# Patient Record
Sex: Female | Born: 1950 | Race: White | Hispanic: No | Marital: Married | Smoking: Former smoker
Health system: Southern US, Community
[De-identification: ages and names within clinical notes are randomized; demographics above are authoritative.]

## PROBLEM LIST (undated history)

## (undated) ENCOUNTER — Emergency Department (HOSPITAL_COMMUNITY): Payer: Medicare HMO | Source: Home / Self Care

## (undated) DIAGNOSIS — C3490 Malignant neoplasm of unspecified part of unspecified bronchus or lung: Secondary | ICD-10-CM

## (undated) DIAGNOSIS — G629 Polyneuropathy, unspecified: Secondary | ICD-10-CM

## (undated) DIAGNOSIS — T8859XA Other complications of anesthesia, initial encounter: Secondary | ICD-10-CM

## (undated) DIAGNOSIS — I8290 Acute embolism and thrombosis of unspecified vein: Secondary | ICD-10-CM

## (undated) DIAGNOSIS — E785 Hyperlipidemia, unspecified: Secondary | ICD-10-CM

## (undated) DIAGNOSIS — Z923 Personal history of irradiation: Secondary | ICD-10-CM

## (undated) DIAGNOSIS — I871 Compression of vein: Secondary | ICD-10-CM

## (undated) DIAGNOSIS — T4145XA Adverse effect of unspecified anesthetic, initial encounter: Secondary | ICD-10-CM

## (undated) HISTORY — DX: Acute embolism and thrombosis of unspecified vein: I82.90

## (undated) HISTORY — PX: TONSILLECTOMY: SUR1361

## (undated) HISTORY — PX: TUBAL LIGATION: SHX77

## (undated) HISTORY — DX: Compression of vein: I87.1

## (undated) HISTORY — DX: Malignant neoplasm of unspecified part of unspecified bronchus or lung: C34.90

## (undated) HISTORY — DX: Polyneuropathy, unspecified: G62.9

## (undated) HISTORY — DX: Hyperlipidemia, unspecified: E78.5

## (undated) HISTORY — PX: CERVICAL BIOPSY: SHX590

---

## 2000-05-14 ENCOUNTER — Encounter: Payer: Self-pay | Admitting: Emergency Medicine

## 2000-05-14 ENCOUNTER — Emergency Department (HOSPITAL_COMMUNITY): Admission: EM | Admit: 2000-05-14 | Discharge: 2000-05-14 | Payer: Self-pay | Admitting: Emergency Medicine

## 2004-11-04 ENCOUNTER — Encounter: Admission: RE | Admit: 2004-11-04 | Discharge: 2004-11-04 | Payer: Self-pay | Admitting: Internal Medicine

## 2006-01-17 ENCOUNTER — Encounter: Admission: RE | Admit: 2006-01-17 | Discharge: 2006-01-17 | Payer: Self-pay | Admitting: Internal Medicine

## 2007-02-01 ENCOUNTER — Encounter: Admission: RE | Admit: 2007-02-01 | Discharge: 2007-02-01 | Payer: Self-pay | Admitting: Internal Medicine

## 2010-07-10 ENCOUNTER — Inpatient Hospital Stay (HOSPITAL_COMMUNITY): Admission: EM | Admit: 2010-07-10 | Discharge: 2010-07-15 | Payer: Self-pay | Admitting: Emergency Medicine

## 2010-07-11 ENCOUNTER — Ambulatory Visit: Payer: Self-pay | Admitting: Thoracic Surgery (Cardiothoracic Vascular Surgery)

## 2010-07-12 ENCOUNTER — Encounter (INDEPENDENT_AMBULATORY_CARE_PROVIDER_SITE_OTHER): Payer: Self-pay | Admitting: Internal Medicine

## 2010-07-12 ENCOUNTER — Ambulatory Visit: Payer: Self-pay | Admitting: Oncology

## 2010-07-13 ENCOUNTER — Encounter (INDEPENDENT_AMBULATORY_CARE_PROVIDER_SITE_OTHER): Payer: Self-pay | Admitting: Internal Medicine

## 2010-07-13 ENCOUNTER — Encounter (HOSPITAL_COMMUNITY): Payer: Self-pay | Admitting: Oncology

## 2010-07-14 ENCOUNTER — Ambulatory Visit: Payer: Self-pay | Admitting: Oncology

## 2010-07-28 LAB — CBC WITH DIFFERENTIAL/PLATELET
Basophils Absolute: 0 10*3/uL (ref 0.0–0.1)
EOS%: 0.2 % (ref 0.0–7.0)
Eosinophils Absolute: 0 10*3/uL (ref 0.0–0.5)
HCT: 34.2 % — ABNORMAL LOW (ref 34.8–46.6)
HGB: 11.8 g/dL (ref 11.6–15.9)
MCH: 30.7 pg (ref 25.1–34.0)
MCV: 88.8 fL (ref 79.5–101.0)
MONO%: 4.4 % (ref 0.0–14.0)
NEUT%: 87.9 % — ABNORMAL HIGH (ref 38.4–76.8)
Platelets: 140 10*3/uL — ABNORMAL LOW (ref 145–400)

## 2010-08-03 ENCOUNTER — Ambulatory Visit (HOSPITAL_COMMUNITY): Admission: RE | Admit: 2010-08-03 | Discharge: 2010-08-03 | Payer: Self-pay | Admitting: Oncology

## 2010-08-03 LAB — COMPREHENSIVE METABOLIC PANEL
AST: 21 U/L (ref 0–37)
Alkaline Phosphatase: 92 U/L (ref 39–117)
BUN: 13 mg/dL (ref 6–23)
Calcium: 9.2 mg/dL (ref 8.4–10.5)
Creatinine, Ser: 0.64 mg/dL (ref 0.40–1.20)
Glucose, Bld: 96 mg/dL (ref 70–99)

## 2010-08-03 LAB — CBC WITH DIFFERENTIAL/PLATELET
BASO%: 1.1 % (ref 0.0–2.0)
Eosinophils Absolute: 0 10*3/uL (ref 0.0–0.5)
LYMPH%: 20.5 % (ref 14.0–49.7)
MCHC: 34.2 g/dL (ref 31.5–36.0)
MONO#: 0.7 10*3/uL (ref 0.1–0.9)
NEUT#: 8 10*3/uL — ABNORMAL HIGH (ref 1.5–6.5)
Platelets: 367 10*3/uL (ref 145–400)
RBC: 3.74 10*6/uL (ref 3.70–5.45)
WBC: 11.2 10*3/uL — ABNORMAL HIGH (ref 3.9–10.3)
lymph#: 2.3 10*3/uL (ref 0.9–3.3)
nRBC: 0 % (ref 0–0)

## 2010-08-03 LAB — URIC ACID: Uric Acid, Serum: 3.2 mg/dL (ref 2.4–7.0)

## 2010-08-11 LAB — CBC WITH DIFFERENTIAL/PLATELET
Basophils Absolute: 0.1 10*3/uL (ref 0.0–0.1)
Eosinophils Absolute: 0.2 10*3/uL (ref 0.0–0.5)
HCT: 31.9 % — ABNORMAL LOW (ref 34.8–46.6)
HGB: 10.9 g/dL — ABNORMAL LOW (ref 11.6–15.9)
MCV: 89.9 fL (ref 79.5–101.0)
MONO%: 4 % (ref 0.0–14.0)
NEUT#: 16.9 10*3/uL — ABNORMAL HIGH (ref 1.5–6.5)
NEUT%: 85.6 % — ABNORMAL HIGH (ref 38.4–76.8)
RDW: 15.5 % — ABNORMAL HIGH (ref 11.2–14.5)

## 2010-08-13 ENCOUNTER — Ambulatory Visit: Payer: Self-pay | Admitting: Oncology

## 2010-08-17 LAB — CBC WITH DIFFERENTIAL/PLATELET
Eosinophils Absolute: 0.2 10*3/uL (ref 0.0–0.5)
HCT: 31.7 % — ABNORMAL LOW (ref 34.8–46.6)
LYMPH%: 17.1 % (ref 14.0–49.7)
MCHC: 33.8 g/dL (ref 31.5–36.0)
MONO#: 0.8 10*3/uL (ref 0.1–0.9)
NEUT#: 8.5 10*3/uL — ABNORMAL HIGH (ref 1.5–6.5)
NEUT%: 72.9 % (ref 38.4–76.8)
Platelets: 161 10*3/uL (ref 145–400)
WBC: 11.6 10*3/uL — ABNORMAL HIGH (ref 3.9–10.3)

## 2010-08-23 ENCOUNTER — Ambulatory Visit (HOSPITAL_COMMUNITY): Admission: RE | Admit: 2010-08-23 | Discharge: 2010-08-23 | Payer: Self-pay | Admitting: Oncology

## 2010-08-23 LAB — CBC WITH DIFFERENTIAL/PLATELET
Basophils Absolute: 0 10*3/uL (ref 0.0–0.1)
EOS%: 2.2 % (ref 0.0–7.0)
HCT: 32.3 % — ABNORMAL LOW (ref 34.8–46.6)
HGB: 11.3 g/dL — ABNORMAL LOW (ref 11.6–15.9)
MCH: 32.9 pg (ref 25.1–34.0)
MCV: 93.7 fL (ref 79.5–101.0)
MONO%: 6 % (ref 0.0–14.0)
NEUT%: 76.2 % (ref 38.4–76.8)

## 2010-08-23 LAB — COMPREHENSIVE METABOLIC PANEL
Albumin: 4.3 g/dL (ref 3.5–5.2)
Alkaline Phosphatase: 110 U/L (ref 39–117)
BUN: 13 mg/dL (ref 6–23)
Glucose, Bld: 95 mg/dL (ref 70–99)
Total Bilirubin: 0.2 mg/dL — ABNORMAL LOW (ref 0.3–1.2)

## 2010-09-03 LAB — CBC WITH DIFFERENTIAL/PLATELET
Eosinophils Absolute: 0.1 10*3/uL (ref 0.0–0.5)
LYMPH%: 24.1 % (ref 14.0–49.7)
MONO#: 0.5 10*3/uL (ref 0.1–0.9)
NEUT#: 4 10*3/uL (ref 1.5–6.5)
Platelets: 167 10*3/uL (ref 145–400)
RBC: 3.23 10*6/uL — ABNORMAL LOW (ref 3.70–5.45)
WBC: 6.1 10*3/uL (ref 3.9–10.3)
lymph#: 1.5 10*3/uL (ref 0.9–3.3)
nRBC: 0 % (ref 0–0)

## 2010-09-10 LAB — CBC WITH DIFFERENTIAL/PLATELET
Eosinophils Absolute: 0.2 10*3/uL (ref 0.0–0.5)
MONO#: 0.9 10*3/uL (ref 0.1–0.9)
NEUT#: 11.8 10*3/uL — ABNORMAL HIGH (ref 1.5–6.5)
Platelets: 228 10*3/uL (ref 145–400)
RBC: 3.25 10*6/uL — ABNORMAL LOW (ref 3.70–5.45)
RDW: 20.8 % — ABNORMAL HIGH (ref 11.2–14.5)
WBC: 14.9 10*3/uL — ABNORMAL HIGH (ref 3.9–10.3)

## 2010-09-10 LAB — COMPREHENSIVE METABOLIC PANEL
Albumin: 4.5 g/dL (ref 3.5–5.2)
CO2: 24 mEq/L (ref 19–32)
Chloride: 102 mEq/L (ref 96–112)
Glucose, Bld: 90 mg/dL (ref 70–99)
Potassium: 4.7 mEq/L (ref 3.5–5.3)
Sodium: 140 mEq/L (ref 135–145)
Total Protein: 6.3 g/dL (ref 6.0–8.3)

## 2010-09-10 LAB — CEA: CEA: <0.5 ng/mL (ref 0.0–5.0)

## 2010-09-10 LAB — LACTATE DEHYDROGENASE: LDH: 238 U/L (ref 94–250)

## 2010-09-13 ENCOUNTER — Ambulatory Visit: Payer: Self-pay | Admitting: Oncology

## 2010-09-14 ENCOUNTER — Ambulatory Visit
Admission: RE | Admit: 2010-09-14 | Discharge: 2010-11-19 | Payer: Self-pay | Source: Home / Self Care | Attending: Radiation Oncology | Admitting: Radiation Oncology

## 2010-09-21 ENCOUNTER — Ambulatory Visit
Admission: RE | Admit: 2010-09-21 | Discharge: 2010-09-21 | Payer: Self-pay | Source: Home / Self Care | Admitting: Radiation Oncology

## 2010-09-21 LAB — CBC WITH DIFFERENTIAL/PLATELET
Basophils Absolute: 0.1 10*3/uL (ref 0.0–0.1)
EOS%: 0.7 % (ref 0.0–7.0)
HGB: 10.1 g/dL — ABNORMAL LOW (ref 11.6–15.9)
LYMPH%: 21.2 % (ref 14.0–49.7)
MCH: 32.6 pg (ref 25.1–34.0)
MCV: 94.8 fL (ref 79.5–101.0)
MONO%: 6.1 % (ref 0.0–14.0)
RBC: 3.1 10*6/uL — ABNORMAL LOW (ref 3.70–5.45)
RDW: 17.7 % — ABNORMAL HIGH (ref 11.2–14.5)

## 2010-09-28 LAB — CBC WITH DIFFERENTIAL/PLATELET
BASO%: 0.8 % (ref 0.0–2.0)
EOS%: 0.9 % (ref 0.0–7.0)
Eosinophils Absolute: 0.3 10*3/uL (ref 0.0–0.5)
MCHC: 34.7 g/dL (ref 31.5–36.0)
MCV: 95.7 fL (ref 79.5–101.0)
MONO%: 5.4 % (ref 0.0–14.0)
NEUT#: 23.4 10*3/uL — ABNORMAL HIGH (ref 1.5–6.5)
RBC: 3.22 10*6/uL — ABNORMAL LOW (ref 3.70–5.45)
RDW: 18.2 % — ABNORMAL HIGH (ref 11.2–14.5)
nRBC: 0 % (ref 0–0)

## 2010-10-05 LAB — CBC WITH DIFFERENTIAL/PLATELET
EOS%: 2.1 % (ref 0.0–7.0)
Eosinophils Absolute: 0.1 10*3/uL (ref 0.0–0.5)
HCT: 29.6 % — ABNORMAL LOW (ref 34.8–46.6)
HGB: 10.4 g/dL — ABNORMAL LOW (ref 11.6–15.9)
MCH: 34.7 pg — ABNORMAL HIGH (ref 25.1–34.0)
MONO%: 12 % (ref 0.0–14.0)
Platelets: 419 10*3/uL — ABNORMAL HIGH (ref 145–400)
RBC: 3 10*6/uL — ABNORMAL LOW (ref 3.70–5.45)
RDW: 18.9 % — ABNORMAL HIGH (ref 11.2–14.5)
WBC: 5.1 10*3/uL (ref 3.9–10.3)
lymph#: 1.2 10*3/uL (ref 0.9–3.3)

## 2010-10-05 LAB — COMPREHENSIVE METABOLIC PANEL
ALT: 14 U/L (ref 0–35)
Alkaline Phosphatase: 96 U/L (ref 39–117)
Sodium: 140 mEq/L (ref 135–145)
Total Bilirubin: 0.3 mg/dL (ref 0.3–1.2)
Total Protein: 5.9 g/dL — ABNORMAL LOW (ref 6.0–8.3)

## 2010-10-05 LAB — LACTATE DEHYDROGENASE: LDH: 165 U/L (ref 94–250)

## 2010-10-13 LAB — CBC WITH DIFFERENTIAL/PLATELET
Basophils Absolute: 0 10*3/uL (ref 0.0–0.1)
EOS%: 0.6 % (ref 0.0–7.0)
Eosinophils Absolute: 0.1 10*3/uL (ref 0.0–0.5)
HCT: 28.5 % — ABNORMAL LOW (ref 34.8–46.6)
HGB: 10.2 g/dL — ABNORMAL LOW (ref 11.6–15.9)
MCH: 36 pg — ABNORMAL HIGH (ref 25.1–34.0)
MONO#: 0 10*3/uL — ABNORMAL LOW (ref 0.1–0.9)
NEUT#: 7.9 10*3/uL — ABNORMAL HIGH (ref 1.5–6.5)
NEUT%: 90.1 % — ABNORMAL HIGH (ref 38.4–76.8)
RDW: 16.4 % — ABNORMAL HIGH (ref 11.2–14.5)
WBC: 8.8 10*3/uL (ref 3.9–10.3)
lymph#: 0.8 10*3/uL — ABNORMAL LOW (ref 0.9–3.3)

## 2010-10-18 ENCOUNTER — Ambulatory Visit: Payer: Self-pay | Admitting: Oncology

## 2010-10-20 LAB — CBC WITH DIFFERENTIAL/PLATELET
BASO%: 0.5 % (ref 0.0–2.0)
Basophils Absolute: 0.1 10*3/uL (ref 0.0–0.1)
Eosinophils Absolute: 0.2 10*3/uL (ref 0.0–0.5)
HCT: 28.3 % — ABNORMAL LOW (ref 34.8–46.6)
HGB: 9.7 g/dL — ABNORMAL LOW (ref 11.6–15.9)
MONO#: 0.9 10*3/uL (ref 0.1–0.9)
NEUT#: 9.2 10*3/uL — ABNORMAL HIGH (ref 1.5–6.5)
NEUT%: 82.5 % — ABNORMAL HIGH (ref 38.4–76.8)
WBC: 11.1 10*3/uL — ABNORMAL HIGH (ref 3.9–10.3)
lymph#: 0.9 10*3/uL (ref 0.9–3.3)

## 2010-10-21 ENCOUNTER — Ambulatory Visit
Admission: RE | Admit: 2010-10-21 | Discharge: 2010-10-21 | Payer: Self-pay | Source: Home / Self Care | Attending: Radiation Oncology | Admitting: Radiation Oncology

## 2010-10-21 ENCOUNTER — Encounter: Payer: Self-pay | Admitting: Radiation Oncology

## 2010-10-21 DIAGNOSIS — I8290 Acute embolism and thrombosis of unspecified vein: Secondary | ICD-10-CM

## 2010-10-21 HISTORY — DX: Acute embolism and thrombosis of unspecified vein: I82.90

## 2010-10-22 LAB — COMPREHENSIVE METABOLIC PANEL
ALT: 13 U/L (ref 0–35)
AST: 12 U/L (ref 0–37)
Albumin: 4 g/dL (ref 3.5–5.2)
Alkaline Phosphatase: 103 U/L (ref 39–117)
BUN: 15 mg/dL (ref 6–23)
CO2: 25 mEq/L (ref 19–32)
Calcium: 9 mg/dL (ref 8.4–10.5)
Chloride: 105 mEq/L (ref 96–112)
Creatinine, Ser: 0.84 mg/dL (ref 0.40–1.20)
Glucose, Bld: 106 mg/dL — ABNORMAL HIGH (ref 70–99)
Potassium: 4.3 mEq/L (ref 3.5–5.3)
Sodium: 141 mEq/L (ref 135–145)
Total Bilirubin: 0.3 mg/dL (ref 0.3–1.2)
Total Protein: 5.8 g/dL — ABNORMAL LOW (ref 6.0–8.3)

## 2010-10-22 LAB — CBC WITH DIFFERENTIAL/PLATELET
BASO%: 0 % (ref 0.0–2.0)
Basophils Absolute: 0 10*3/uL (ref 0.0–0.1)
EOS%: 1.4 % (ref 0.0–7.0)
Eosinophils Absolute: 0.2 10*3/uL (ref 0.0–0.5)
HCT: 26.6 % — ABNORMAL LOW (ref 34.8–46.6)
HGB: 9.5 g/dL — ABNORMAL LOW (ref 11.6–15.9)
LYMPH%: 5.4 % — ABNORMAL LOW (ref 14.0–49.7)
MCH: 36 pg — ABNORMAL HIGH (ref 25.1–34.0)
MCHC: 35.6 g/dL (ref 31.5–36.0)
MCV: 101.1 fL — ABNORMAL HIGH (ref 79.5–101.0)
MONO#: 0.6 10*3/uL (ref 0.1–0.9)
MONO%: 4.8 % (ref 0.0–14.0)
NEUT#: 11.2 10*3/uL — ABNORMAL HIGH (ref 1.5–6.5)
NEUT%: 88.4 % — ABNORMAL HIGH (ref 38.4–76.8)
Platelets: 124 10*3/uL — ABNORMAL LOW (ref 145–400)
RBC: 2.63 10*6/uL — ABNORMAL LOW (ref 3.70–5.45)
RDW: 15.1 % — ABNORMAL HIGH (ref 11.2–14.5)
WBC: 12.6 10*3/uL — ABNORMAL HIGH (ref 3.9–10.3)
lymph#: 0.7 10*3/uL — ABNORMAL LOW (ref 0.9–3.3)

## 2010-10-22 LAB — LACTATE DEHYDROGENASE: LDH: 171 U/L (ref 94–250)

## 2010-10-29 ENCOUNTER — Ambulatory Visit: Admit: 2010-10-29 | Payer: Self-pay | Admitting: Radiation Oncology

## 2010-10-29 LAB — CBC WITH DIFFERENTIAL/PLATELET
Basophils Absolute: 0 10*3/uL (ref 0.0–0.1)
EOS%: 3.1 % (ref 0.0–7.0)
Eosinophils Absolute: 0.1 10*3/uL (ref 0.0–0.5)
HCT: 29.3 % — ABNORMAL LOW (ref 34.8–46.6)
HGB: 10.4 g/dL — ABNORMAL LOW (ref 11.6–15.9)
MCH: 35.7 pg — ABNORMAL HIGH (ref 25.1–34.0)
MCV: 100.2 fL (ref 79.5–101.0)
MONO%: 13.7 % (ref 0.0–14.0)
NEUT#: 3.5 10*3/uL (ref 1.5–6.5)
NEUT%: 74.4 % (ref 38.4–76.8)
Platelets: 346 10*3/uL (ref 145–400)
RDW: 15.6 % — ABNORMAL HIGH (ref 11.2–14.5)

## 2010-10-29 LAB — COMPREHENSIVE METABOLIC PANEL
Albumin: 4.3 g/dL (ref 3.5–5.2)
BUN: 20 mg/dL (ref 6–23)
CO2: 24 mEq/L (ref 19–32)
Glucose, Bld: 97 mg/dL (ref 70–99)
Sodium: 136 mEq/L (ref 135–145)
Total Bilirubin: 0.3 mg/dL (ref 0.3–1.2)
Total Protein: 6 g/dL (ref 6.0–8.3)

## 2010-11-03 ENCOUNTER — Encounter: Admission: RE | Admit: 2010-11-03 | Payer: Self-pay | Source: Home / Self Care | Admitting: Radiation Oncology

## 2010-11-04 LAB — CBC WITH DIFFERENTIAL/PLATELET
Basophils Absolute: 0.1 10*3/uL (ref 0.0–0.1)
Eosinophils Absolute: 0.2 10*3/uL (ref 0.0–0.5)
HGB: 10.7 g/dL — ABNORMAL LOW (ref 11.6–15.9)
MONO#: 0.8 10*3/uL (ref 0.1–0.9)
NEUT#: 4.3 10*3/uL (ref 1.5–6.5)
Platelets: 185 10*3/uL (ref 145–400)
RBC: 3.15 10*6/uL — ABNORMAL LOW (ref 3.70–5.45)
RDW: 14.5 % (ref 11.2–14.5)
WBC: 5.7 10*3/uL (ref 3.9–10.3)
nRBC: 0 % (ref 0–0)

## 2010-11-10 LAB — CBC WITH DIFFERENTIAL/PLATELET
BASO%: 0.5 % (ref 0.0–2.0)
Basophils Absolute: 0.1 10*3/uL (ref 0.0–0.1)
EOS%: 1.1 % (ref 0.0–7.0)
Eosinophils Absolute: 0.2 10*3/uL (ref 0.0–0.5)
HCT: 29.7 % — ABNORMAL LOW (ref 34.8–46.6)
HGB: 10.4 g/dL — ABNORMAL LOW (ref 11.6–15.9)
LYMPH%: 2 % — ABNORMAL LOW (ref 14.0–49.7)
MCH: 34.1 pg — ABNORMAL HIGH (ref 25.1–34.0)
MCHC: 35 g/dL (ref 31.5–36.0)
MCV: 97.4 fL (ref 79.5–101.0)
MONO#: 0.2 10*3/uL (ref 0.1–0.9)
MONO%: 0.8 % (ref 0.0–14.0)
NEUT#: 18.1 10*3/uL — ABNORMAL HIGH (ref 1.5–6.5)
NEUT%: 95.6 % — ABNORMAL HIGH (ref 38.4–76.8)
Platelets: 79 10*3/uL — ABNORMAL LOW (ref 145–400)
RBC: 3.05 10*6/uL — ABNORMAL LOW (ref 3.70–5.45)
RDW: 14.1 % (ref 11.2–14.5)
WBC: 18.9 10*3/uL — ABNORMAL HIGH (ref 3.9–10.3)
lymph#: 0.4 10*3/uL — ABNORMAL LOW (ref 0.9–3.3)
nRBC: 0 % (ref 0–0)

## 2010-11-12 ENCOUNTER — Inpatient Hospital Stay (HOSPITAL_COMMUNITY)
Admission: EM | Admit: 2010-11-12 | Discharge: 2010-11-16 | Payer: Self-pay | Source: Home / Self Care | Attending: Internal Medicine | Admitting: Internal Medicine

## 2010-11-18 LAB — CBC WITH DIFFERENTIAL/PLATELET
BASO%: 0.3 % (ref 0.0–2.0)
Basophils Absolute: 0 10*3/uL (ref 0.0–0.1)
EOS%: 2.6 % (ref 0.0–7.0)
Eosinophils Absolute: 0.1 10*3/uL (ref 0.0–0.5)
HCT: 28.1 % — ABNORMAL LOW (ref 34.8–46.6)
HGB: 10 g/dL — ABNORMAL LOW (ref 11.6–15.9)
LYMPH%: 9.8 % — ABNORMAL LOW (ref 14.0–49.7)
MCH: 33.5 pg (ref 25.1–34.0)
MCHC: 35.7 g/dL (ref 31.5–36.0)
MCV: 93.8 fL (ref 79.5–101.0)
MONO#: 0.4 10*3/uL (ref 0.1–0.9)
MONO%: 6.4 % (ref 0.0–14.0)
NEUT#: 4.6 10*3/uL (ref 1.5–6.5)
NEUT%: 80.9 % — ABNORMAL HIGH (ref 38.4–76.8)
Platelets: 72 10*3/uL — ABNORMAL LOW (ref 145–400)
RBC: 2.99 10*6/uL — ABNORMAL LOW (ref 3.70–5.45)
RDW: 15.6 % — ABNORMAL HIGH (ref 11.2–14.5)
WBC: 5.7 10*3/uL (ref 3.9–10.3)
lymph#: 0.6 10*3/uL — ABNORMAL LOW (ref 0.9–3.3)

## 2010-11-19 ENCOUNTER — Ambulatory Visit: Payer: Self-pay | Admitting: Oncology

## 2010-11-22 LAB — CBC
HCT: 24.9 % — ABNORMAL LOW (ref 36.0–46.0)
HCT: 22.8 % — ABNORMAL LOW (ref 36.0–46.0)
HCT: 23 % — ABNORMAL LOW (ref 36.0–46.0)
HCT: 20.6 % — ABNORMAL LOW (ref 36.0–46.0)
HCT: 27.6 % — ABNORMAL LOW (ref 36.0–46.0)
HCT: 31.8 % — ABNORMAL LOW (ref 36.0–46.0)
Hemoglobin: 8.6 g/dL — ABNORMAL LOW (ref 12.0–15.0)
Hemoglobin: 8 g/dL — ABNORMAL LOW (ref 12.0–15.0)
Hemoglobin: 8 g/dL — ABNORMAL LOW (ref 12.0–15.0)
Hemoglobin: 7.4 g/dL — ABNORMAL LOW (ref 12.0–15.0)
Hemoglobin: 9.8 g/dL — ABNORMAL LOW (ref 12.0–15.0)
Hemoglobin: 11.3 g/dL — ABNORMAL LOW (ref 12.0–15.0)
MCH: 34.3 pg — ABNORMAL HIGH (ref 26.0–34.0)
MCH: 34.2 pg — ABNORMAL HIGH (ref 26.0–34.0)
MCH: 33.5 pg (ref 26.0–34.0)
MCH: 33.8 pg (ref 26.0–34.0)
MCH: 32.3 pg (ref 26.0–34.0)
MCH: 32.1 pg (ref 26.0–34.0)
MCHC: 34.5 g/dL (ref 30.0–36.0)
MCHC: 35.1 g/dL (ref 30.0–36.0)
MCHC: 34.8 g/dL (ref 30.0–36.0)
MCHC: 35.9 g/dL (ref 30.0–36.0)
MCHC: 35.5 g/dL (ref 30.0–36.0)
MCHC: 35.5 g/dL (ref 30.0–36.0)
MCV: 99.2 fL (ref 78.0–100.0)
MCV: 97.4 fL (ref 78.0–100.0)
MCV: 96.2 fL (ref 78.0–100.0)
MCV: 94.1 fL (ref 78.0–100.0)
MCV: 91.1 fL (ref 78.0–100.0)
MCV: 90.3 fL (ref 78.0–100.0)
Platelets: 41 10*3/uL — ABNORMAL LOW (ref 150–400)
Platelets: 34 10*3/uL — ABNORMAL LOW (ref 150–400)
Platelets: 27 10*3/uL — CL (ref 150–400)
Platelets: 20 10*3/uL — CL (ref 150–400)
Platelets: 15 10*3/uL — CL (ref 150–400)
Platelets: 17 10*3/uL — CL (ref 150–400)
RBC: 2.51 MIL/uL — ABNORMAL LOW (ref 3.87–5.11)
RBC: 2.34 MIL/uL — ABNORMAL LOW (ref 3.87–5.11)
RBC: 2.39 MIL/uL — ABNORMAL LOW (ref 3.87–5.11)
RBC: 2.19 MIL/uL — ABNORMAL LOW (ref 3.87–5.11)
RBC: 3.03 MIL/uL — ABNORMAL LOW (ref 3.87–5.11)
RBC: 3.52 MIL/uL — ABNORMAL LOW (ref 3.87–5.11)
RDW: 13.6 % (ref 11.5–15.5)
RDW: 13.3 % (ref 11.5–15.5)
RDW: 13 % (ref 11.5–15.5)
RDW: 12.8 % (ref 11.5–15.5)
RDW: 14.1 % (ref 11.5–15.5)
RDW: 14.3 % (ref 11.5–15.5)
WBC: 1.3 10*3/uL — CL (ref 4.0–10.5)
WBC: 0.8 10*3/uL — CL (ref 4.0–10.5)
WBC: 0.4 10*3/uL — CL (ref 4.0–10.5)
WBC: 0.7 10*3/uL — CL (ref 4.0–10.5)
WBC: 1.8 10*3/uL — ABNORMAL LOW (ref 4.0–10.5)
WBC: 2.1 10*3/uL — ABNORMAL LOW (ref 4.0–10.5)

## 2010-11-22 LAB — CROSSMATCH
ABO/RH(D): B POS
ABO/RH(D): B POS
Antibody Screen: NEGATIVE
Antibody Screen: NEGATIVE
Unit division: 0
Unit division: 0
Unit division: 0
Unit division: 0
Unit division: 0

## 2010-11-22 LAB — BASIC METABOLIC PANEL
BUN: 18 mg/dL (ref 6–23)
BUN: 12 mg/dL (ref 6–23)
BUN: 10 mg/dL (ref 6–23)
BUN: 10 mg/dL (ref 6–23)
CO2: 24 mEq/L (ref 19–32)
CO2: 26 mEq/L (ref 19–32)
CO2: 27 mEq/L (ref 19–32)
CO2: 26 mEq/L (ref 19–32)
Calcium: 8.7 mg/dL (ref 8.4–10.5)
Calcium: 8.7 mg/dL (ref 8.4–10.5)
Calcium: 8.8 mg/dL (ref 8.4–10.5)
Calcium: 9.3 mg/dL (ref 8.4–10.5)
Chloride: 105 mEq/L (ref 96–112)
Chloride: 105 mEq/L (ref 96–112)
Chloride: 105 mEq/L (ref 96–112)
Chloride: 105 mEq/L (ref 96–112)
Creatinine, Ser: 0.84 mg/dL (ref 0.4–1.2)
Creatinine, Ser: 0.8 mg/dL (ref 0.4–1.2)
Creatinine, Ser: 0.75 mg/dL (ref 0.4–1.2)
Creatinine, Ser: 0.83 mg/dL (ref 0.4–1.2)
GFR calc Af Amer: >60 mL/min (ref >60)
GFR calc Af Amer: >60 mL/min (ref >60)
GFR calc Af Amer: >60 mL/min (ref >60)
GFR calc Af Amer: >60 mL/min (ref >60)
GFR calc non Af Amer: >60 mL/min (ref >60)
GFR calc non Af Amer: >60 mL/min (ref >60)
GFR calc non Af Amer: >60 mL/min (ref >60)
GFR calc non Af Amer: >60 mL/min (ref >60)
Glucose, Bld: 105 mg/dL — ABNORMAL HIGH (ref 70–99)
Glucose, Bld: 105 mg/dL — ABNORMAL HIGH (ref 70–99)
Glucose, Bld: 98 mg/dL (ref 70–99)
Glucose, Bld: 134 mg/dL — ABNORMAL HIGH (ref 70–99)
Potassium: 4 mEq/L (ref 3.5–5.1)
Potassium: 3.6 mEq/L (ref 3.5–5.1)
Potassium: 3.6 mEq/L (ref 3.5–5.1)
Potassium: 3.6 mEq/L (ref 3.5–5.1)
Sodium: 136 mEq/L (ref 135–145)
Sodium: 138 mEq/L (ref 135–145)
Sodium: 139 mEq/L (ref 135–145)
Sodium: 139 mEq/L (ref 135–145)

## 2010-11-22 LAB — DIFFERENTIAL
Band Neutrophils: 0 % (ref 0–10)
Band Neutrophils: 0 % (ref 0–10)
Band Neutrophils: 0 % (ref 0–10)
Basophils Absolute: 0 10*3/uL (ref 0.0–0.1)
Basophils Absolute: 0 10*3/uL (ref 0.0–0.1)
Basophils Absolute: 0 10*3/uL (ref 0.0–0.1)
Basophils Absolute: 0 10*3/uL (ref 0.0–0.1)
Basophils Relative: 1 % (ref 0–1)
Basophils Relative: 0 % (ref 0–1)
Basophils Relative: 0 % (ref 0–1)
Basophils Relative: 0 % (ref 0–1)
Eosinophils Absolute: 0.1 10*3/uL (ref 0.0–0.7)
Eosinophils Absolute: 0 10*3/uL (ref 0.0–0.7)
Eosinophils Absolute: 0 10*3/uL (ref 0.0–0.7)
Eosinophils Absolute: 0 10*3/uL (ref 0.0–0.7)
Eosinophils Relative: 5 % (ref 0–5)
Eosinophils Relative: 0 % (ref 0–5)
Eosinophils Relative: 0 % (ref 0–5)
Eosinophils Relative: 0 % (ref 0–5)
Lymphocytes Relative: 16 % (ref 12–46)
Lymphocytes Relative: 0 % — ABNORMAL LOW (ref 12–46)
Lymphocytes Relative: 0 % — ABNORMAL LOW (ref 12–46)
Lymphocytes Relative: 0 % — ABNORMAL LOW (ref 12–46)
Lymphs Abs: 0.2 10*3/uL — ABNORMAL LOW (ref 0.7–4.0)
Lymphs Abs: 0 10*3/uL — ABNORMAL LOW (ref 0.7–4.0)
Lymphs Abs: 0 10*3/uL — ABNORMAL LOW (ref 0.7–4.0)
Lymphs Abs: 0 10*3/uL — ABNORMAL LOW (ref 0.7–4.0)
Monocytes Absolute: 0.1 10*3/uL (ref 0.1–1.0)
Monocytes Absolute: 0 10*3/uL — ABNORMAL LOW (ref 0.1–1.0)
Monocytes Absolute: 0 10*3/uL — ABNORMAL LOW (ref 0.1–1.0)
Monocytes Absolute: 0 10*3/uL — ABNORMAL LOW (ref 0.1–1.0)
Monocytes Relative: 4 % (ref 3–12)
Monocytes Relative: 0 % — ABNORMAL LOW (ref 3–12)
Monocytes Relative: 0 % — ABNORMAL LOW (ref 3–12)
Monocytes Relative: 0 % — ABNORMAL LOW (ref 3–12)
Neutro Abs: 0.9 10*3/uL — ABNORMAL LOW (ref 1.7–7.7)
Neutrophils Relative %: 74 % (ref 43–77)
Neutrophils Relative %: 0 % — ABNORMAL LOW (ref 43–77)
Neutrophils Relative %: 0 % — ABNORMAL LOW (ref 43–77)
Neutrophils Relative %: 0 % — ABNORMAL LOW (ref 43–77)
nRBC: 0 /100 WBC
nRBC: 0 /100 WBC
nRBC: 0 /100 WBC

## 2010-11-22 LAB — PREPARE PLATELETS: Unit division: 0

## 2010-11-22 LAB — NO BLOOD PRODUCTS

## 2010-11-22 LAB — COMPREHENSIVE METABOLIC PANEL
ALT: 13 U/L (ref 0–35)
AST: 11 U/L (ref 0–37)
Albumin: 3.1 g/dL — ABNORMAL LOW (ref 3.5–5.2)
Alkaline Phosphatase: 82 U/L (ref 39–117)
BUN: 9 mg/dL (ref 6–23)
CO2: 25 mEq/L (ref 19–32)
Calcium: 8.9 mg/dL (ref 8.4–10.5)
Chloride: 106 mEq/L (ref 96–112)
Creatinine, Ser: 0.74 mg/dL (ref 0.4–1.2)
GFR calc Af Amer: >60 mL/min (ref >60)
GFR calc non Af Amer: >60 mL/min (ref >60)
Glucose, Bld: 106 mg/dL — ABNORMAL HIGH (ref 70–99)
Potassium: 3.7 mEq/L (ref 3.5–5.1)
Sodium: 138 mEq/L (ref 135–145)
Total Bilirubin: 0.7 mg/dL (ref 0.3–1.2)
Total Protein: 5.2 g/dL — ABNORMAL LOW (ref 6.0–8.3)

## 2010-11-22 LAB — PHOSPHORUS: Phosphorus: 3.1 mg/dL (ref 2.3–4.6)

## 2010-11-22 LAB — ABO/RH: ABO/RH(D): B POS

## 2010-11-22 LAB — PREPARE RBC (CROSSMATCH)

## 2010-11-22 LAB — MAGNESIUM: Magnesium: 1.5 mg/dL (ref 1.5–2.5)

## 2010-11-23 LAB — CBC WITH DIFFERENTIAL/PLATELET
BASO%: 0.4 % (ref 0.0–2.0)
Basophils Absolute: 0 10*3/uL (ref 0.0–0.1)
EOS%: 1 % (ref 0.0–7.0)
Eosinophils Absolute: 0.1 10*3/uL (ref 0.0–0.5)
HCT: 28.9 % — ABNORMAL LOW (ref 34.8–46.6)
HGB: 10.1 g/dL — ABNORMAL LOW (ref 11.6–15.9)
LYMPH%: 12.3 % — ABNORMAL LOW (ref 14.0–49.7)
MCH: 33.4 pg (ref 25.1–34.0)
MCHC: 35.1 g/dL (ref 31.5–36.0)
MCV: 95 fL (ref 79.5–101.0)
MONO#: 0.5 10*3/uL (ref 0.1–0.9)
MONO%: 9 % (ref 0.0–14.0)
NEUT#: 4.6 10*3/uL (ref 1.5–6.5)
NEUT%: 77.3 % — ABNORMAL HIGH (ref 38.4–76.8)
Platelets: 234 10*3/uL (ref 145–400)
RBC: 3.04 10*6/uL — ABNORMAL LOW (ref 3.70–5.45)
RDW: 15.6 % — ABNORMAL HIGH (ref 11.2–14.5)
WBC: 6 10*3/uL (ref 3.9–10.3)
lymph#: 0.7 10*3/uL — ABNORMAL LOW (ref 0.9–3.3)

## 2010-11-23 LAB — COMPREHENSIVE METABOLIC PANEL
ALT: 13 U/L (ref 0–35)
AST: 10 U/L (ref 0–37)
Albumin: 3.9 g/dL (ref 3.5–5.2)
Alkaline Phosphatase: 93 U/L (ref 39–117)
BUN: 15 mg/dL (ref 6–23)
CO2: 27 mEq/L (ref 19–32)
Calcium: 9.2 mg/dL (ref 8.4–10.5)
Chloride: 106 mEq/L (ref 96–112)
Creatinine, Ser: 0.77 mg/dL (ref 0.40–1.20)
Glucose, Bld: 84 mg/dL (ref 70–99)
Potassium: 4.4 mEq/L (ref 3.5–5.3)
Sodium: 141 mEq/L (ref 135–145)
Total Bilirubin: 0.3 mg/dL (ref 0.3–1.2)
Total Protein: 5.6 g/dL — ABNORMAL LOW (ref 6.0–8.3)

## 2010-11-23 LAB — LACTATE DEHYDROGENASE: LDH: 204 U/L (ref 94–250)

## 2010-11-26 ENCOUNTER — Ambulatory Visit (HOSPITAL_COMMUNITY)
Admission: RE | Admit: 2010-11-26 | Discharge: 2010-11-26 | Payer: Self-pay | Source: Home / Self Care | Attending: Radiation Oncology | Admitting: Radiation Oncology

## 2010-11-26 ENCOUNTER — Ambulatory Visit
Admission: RE | Admit: 2010-11-26 | Discharge: 2010-12-07 | Payer: Self-pay | Source: Home / Self Care | Attending: Radiation Oncology | Admitting: Radiation Oncology

## 2010-11-26 ENCOUNTER — Other Ambulatory Visit: Payer: Self-pay | Admitting: Internal Medicine

## 2010-11-26 DIAGNOSIS — C3490 Malignant neoplasm of unspecified part of unspecified bronchus or lung: Secondary | ICD-10-CM

## 2010-12-01 ENCOUNTER — Ambulatory Visit: Admission: RE | Admit: 2010-12-01 | Payer: Self-pay | Source: Home / Self Care | Admitting: Emergency Medicine

## 2010-12-02 LAB — CROSSMATCH
ABO/RH(D): B POS
Antibody Screen: NEGATIVE

## 2010-12-08 ENCOUNTER — Ambulatory Visit: Payer: BC Managed Care – PPO | Attending: Radiation Oncology | Admitting: Radiation Oncology

## 2010-12-08 DIAGNOSIS — Z510 Encounter for antineoplastic radiation therapy: Secondary | ICD-10-CM | POA: Insufficient documentation

## 2010-12-08 DIAGNOSIS — G609 Hereditary and idiopathic neuropathy, unspecified: Secondary | ICD-10-CM | POA: Insufficient documentation

## 2010-12-08 DIAGNOSIS — C3490 Malignant neoplasm of unspecified part of unspecified bronchus or lung: Secondary | ICD-10-CM | POA: Insufficient documentation

## 2010-12-08 DIAGNOSIS — R5381 Other malaise: Secondary | ICD-10-CM | POA: Insufficient documentation

## 2010-12-14 ENCOUNTER — Ambulatory Visit (HOSPITAL_COMMUNITY)
Admission: RE | Admit: 2010-12-14 | Discharge: 2010-12-14 | Disposition: A | Discharge status: Home / Self Care | Payer: BC Managed Care – PPO | Source: Ambulatory Visit | Attending: Internal Medicine | Admitting: Internal Medicine

## 2010-12-14 ENCOUNTER — Encounter (HOSPITAL_COMMUNITY): Payer: Self-pay

## 2010-12-14 DIAGNOSIS — D7389 Other diseases of spleen: Secondary | ICD-10-CM | POA: Insufficient documentation

## 2010-12-14 DIAGNOSIS — C3490 Malignant neoplasm of unspecified part of unspecified bronchus or lung: Secondary | ICD-10-CM

## 2010-12-14 DIAGNOSIS — Z09 Encounter for follow-up examination after completed treatment for conditions other than malignant neoplasm: Secondary | ICD-10-CM | POA: Insufficient documentation

## 2010-12-14 MED ORDER — IOHEXOL 300 MG/ML  SOLN
80.0000 mL | Freq: Once | INTRAMUSCULAR | Status: AC | PRN
  Administered 2010-12-14: 80 mL via INTRAVENOUS

## 2010-12-17 ENCOUNTER — Encounter (HOSPITAL_BASED_OUTPATIENT_CLINIC_OR_DEPARTMENT_OTHER): Payer: BC Managed Care – PPO | Admitting: Oncology

## 2010-12-17 ENCOUNTER — Other Ambulatory Visit (HOSPITAL_COMMUNITY): Payer: Self-pay | Admitting: Oncology

## 2010-12-17 DIAGNOSIS — C7949 Secondary malignant neoplasm of other parts of nervous system: Secondary | ICD-10-CM

## 2010-12-17 DIAGNOSIS — C3490 Malignant neoplasm of unspecified part of unspecified bronchus or lung: Secondary | ICD-10-CM

## 2010-12-17 DIAGNOSIS — Z5111 Encounter for antineoplastic chemotherapy: Secondary | ICD-10-CM

## 2010-12-17 LAB — CBC WITH DIFFERENTIAL/PLATELET
Basophils Absolute: 0 10*3/uL (ref 0.0–0.1)
Eosinophils Absolute: 0.1 10*3/uL (ref 0.0–0.5)
HCT: 28.8 % — ABNORMAL LOW (ref 34.8–46.6)
HGB: 9.9 g/dL — ABNORMAL LOW (ref 11.6–15.9)
MCH: 33.8 pg (ref 25.1–34.0)
MONO#: 0.4 10*3/uL (ref 0.1–0.9)
NEUT#: 4.8 10*3/uL (ref 1.5–6.5)
NEUT%: 82.4 % — ABNORMAL HIGH (ref 38.4–76.8)
WBC: 5.8 10*3/uL (ref 3.9–10.3)
lymph#: 0.5 10*3/uL — ABNORMAL LOW (ref 0.9–3.3)

## 2011-01-17 ENCOUNTER — Other Ambulatory Visit (HOSPITAL_COMMUNITY): Payer: Self-pay | Admitting: Oncology

## 2011-01-17 ENCOUNTER — Encounter (HOSPITAL_BASED_OUTPATIENT_CLINIC_OR_DEPARTMENT_OTHER): Payer: PRIVATE HEALTH INSURANCE | Admitting: Oncology

## 2011-01-17 DIAGNOSIS — C7931 Secondary malignant neoplasm of brain: Secondary | ICD-10-CM

## 2011-01-17 DIAGNOSIS — C3490 Malignant neoplasm of unspecified part of unspecified bronchus or lung: Secondary | ICD-10-CM

## 2011-01-17 LAB — CBC WITH DIFFERENTIAL/PLATELET
BASO%: 0.3 % (ref 0.0–2.0)
LYMPH%: 9.2 % — ABNORMAL LOW (ref 14.0–49.7)
MCH: 34 pg (ref 25.1–34.0)
MCHC: 35.1 g/dL (ref 31.5–36.0)
MCV: 96.7 fL (ref 79.5–101.0)
MONO%: 6.4 % (ref 0.0–14.0)
Platelets: 203 10*3/uL (ref 145–400)
RBC: 3.18 10*6/uL — ABNORMAL LOW (ref 3.70–5.45)

## 2011-01-17 LAB — COMPREHENSIVE METABOLIC PANEL
ALT: 16 U/L (ref 0–35)
CO2: 27 mEq/L (ref 19–32)
Creatinine, Ser: 0.92 mg/dL (ref 0.40–1.20)
Total Bilirubin: 0.5 mg/dL (ref 0.3–1.2)

## 2011-01-17 LAB — CEA: CEA: <0.5 ng/mL (ref 0.0–5.0)

## 2011-01-17 LAB — LACTATE DEHYDROGENASE: LDH: 141 U/L (ref 94–250)

## 2011-01-20 LAB — CBC
HCT: 39.7 % (ref 36.0–46.0)
HCT: 40 % (ref 36.0–46.0)
Hemoglobin: 13.6 g/dL (ref 12.0–15.0)
MCH: 28.6 pg (ref 26.0–34.0)
MCH: 30.7 pg (ref 26.0–34.0)
MCV: 89.6 fL (ref 78.0–100.0)
Platelets: 261 10*3/uL (ref 150–400)
Platelets: 262 10*3/uL (ref 150–400)
Platelets: 314 10*3/uL (ref 150–400)
RBC: 4.4 MIL/uL (ref 3.87–5.11)
RBC: 4.43 MIL/uL (ref 3.87–5.11)
RDW: 13.2 % (ref 11.5–15.5)
WBC: 10.6 10*3/uL — ABNORMAL HIGH (ref 4.0–10.5)
WBC: 12 10*3/uL — ABNORMAL HIGH (ref 4.0–10.5)
WBC: 5.9 10*3/uL (ref 4.0–10.5)

## 2011-01-20 LAB — DIFFERENTIAL
Basophils Relative: 0 % (ref 0–1)
Eosinophils Absolute: 0.1 10*3/uL (ref 0.0–0.7)
Eosinophils Absolute: 0.2 10*3/uL (ref 0.0–0.7)
Eosinophils Relative: 1 % (ref 0–5)
Eosinophils Relative: 2 % (ref 0–5)
Lymphocytes Relative: 15 % (ref 12–46)
Lymphs Abs: 1.6 10*3/uL (ref 0.7–4.0)
Monocytes Absolute: 0.9 10*3/uL (ref 0.1–1.0)
Monocytes Relative: 6 % (ref 3–12)
Monocytes Relative: 7 % (ref 3–12)

## 2011-01-20 LAB — TYPE AND SCREEN

## 2011-01-20 LAB — BASIC METABOLIC PANEL
CO2: 26 mEq/L (ref 19–32)
CO2: 28 mEq/L (ref 19–32)
Calcium: 8.7 mg/dL (ref 8.4–10.5)
Chloride: 101 mEq/L (ref 96–112)
Creatinine, Ser: 0.72 mg/dL (ref 0.4–1.2)
Creatinine, Ser: 0.78 mg/dL (ref 0.4–1.2)
GFR calc Af Amer: >60 mL/min (ref >60)
GFR calc Af Amer: >60 mL/min (ref >60)
Potassium: 3.9 mEq/L (ref 3.5–5.1)
Sodium: 135 mEq/L (ref 135–145)

## 2011-01-20 LAB — POCT I-STAT, CHEM 8
BUN: 9 mg/dL (ref 6–23)
Calcium, Ion: 1.08 mmol/L — ABNORMAL LOW (ref 1.12–1.32)
Chloride: 100 mEq/L (ref 96–112)
HCT: 44 % (ref 36.0–46.0)
Potassium: 4.1 mEq/L (ref 3.5–5.1)

## 2011-01-20 LAB — HEPATIC FUNCTION PANEL
Alkaline Phosphatase: 62 U/L (ref 39–117)
Indirect Bilirubin: 0.4 mg/dL (ref 0.3–0.9)
Total Bilirubin: 0.6 mg/dL (ref 0.3–1.2)

## 2011-01-20 LAB — COMPREHENSIVE METABOLIC PANEL
ALT: 27 U/L (ref 0–35)
AST: 28 U/L (ref 0–37)
Albumin: 3.3 g/dL — ABNORMAL LOW (ref 3.5–5.2)
Alkaline Phosphatase: 79 U/L (ref 39–117)
GFR calc Af Amer: >60 mL/min (ref >60)
Glucose, Bld: 110 mg/dL — ABNORMAL HIGH (ref 70–99)
Potassium: 3.9 mEq/L (ref 3.5–5.1)
Sodium: 136 mEq/L (ref 135–145)
Total Protein: 6.8 g/dL (ref 6.0–8.3)

## 2011-01-20 LAB — GLUCOSE, CAPILLARY: Glucose-Capillary: 124 mg/dL — ABNORMAL HIGH (ref 70–99)

## 2011-01-20 LAB — PROTIME-INR: INR: 0.9 (ref 0.00–1.49)

## 2011-01-20 LAB — URIC ACID: Uric Acid, Serum: 4.3 mg/dL (ref 2.4–7.0)

## 2011-01-20 LAB — CEA: CEA: 0.6 ng/mL (ref 0.0–5.0)

## 2011-01-20 LAB — MAGNESIUM: Magnesium: 2.2 mg/dL (ref 1.5–2.5)

## 2011-01-26 ENCOUNTER — Ambulatory Visit: Payer: PRIVATE HEALTH INSURANCE | Attending: Radiation Oncology | Admitting: Radiation Oncology

## 2011-03-22 ENCOUNTER — Encounter (HOSPITAL_BASED_OUTPATIENT_CLINIC_OR_DEPARTMENT_OTHER): Payer: PRIVATE HEALTH INSURANCE | Admitting: Oncology

## 2011-03-22 ENCOUNTER — Other Ambulatory Visit (HOSPITAL_COMMUNITY): Payer: Self-pay | Admitting: Oncology

## 2011-03-22 DIAGNOSIS — C3490 Malignant neoplasm of unspecified part of unspecified bronchus or lung: Secondary | ICD-10-CM

## 2011-03-22 DIAGNOSIS — C7931 Secondary malignant neoplasm of brain: Secondary | ICD-10-CM

## 2011-03-22 LAB — CBC WITH DIFFERENTIAL/PLATELET
BASO%: 0.2 % (ref 0.0–2.0)
HCT: 36.1 % (ref 34.8–46.6)
LYMPH%: 17 % (ref 14.0–49.7)
MCHC: 34.9 g/dL (ref 31.5–36.0)
MCV: 92.9 fL (ref 79.5–101.0)
MONO%: 8.1 % (ref 0.0–14.0)
NEUT%: 73.1 % (ref 38.4–76.8)
Platelets: 226 10*3/uL (ref 145–400)
RBC: 3.88 10*6/uL (ref 3.70–5.45)

## 2011-03-22 LAB — COMPREHENSIVE METABOLIC PANEL
Alkaline Phosphatase: 77 U/L (ref 39–117)
Creatinine, Ser: 0.89 mg/dL (ref 0.40–1.20)
Glucose, Bld: 97 mg/dL (ref 70–99)
Sodium: 139 mEq/L (ref 135–145)
Total Bilirubin: 0.5 mg/dL (ref 0.3–1.2)
Total Protein: 6.2 g/dL (ref 6.0–8.3)

## 2011-03-22 LAB — CEA: CEA: <0.5 ng/mL (ref 0.0–5.0)

## 2011-03-22 LAB — LACTATE DEHYDROGENASE: LDH: 130 U/L (ref 94–250)

## 2011-05-23 ENCOUNTER — Encounter (HOSPITAL_BASED_OUTPATIENT_CLINIC_OR_DEPARTMENT_OTHER): Payer: BC Managed Care – PPO | Admitting: Oncology

## 2011-05-23 ENCOUNTER — Other Ambulatory Visit (HOSPITAL_COMMUNITY): Payer: Self-pay | Admitting: Oncology

## 2011-05-23 DIAGNOSIS — C3410 Malignant neoplasm of upper lobe, unspecified bronchus or lung: Secondary | ICD-10-CM

## 2011-05-23 DIAGNOSIS — C3490 Malignant neoplasm of unspecified part of unspecified bronchus or lung: Secondary | ICD-10-CM

## 2011-05-23 DIAGNOSIS — C7949 Secondary malignant neoplasm of other parts of nervous system: Secondary | ICD-10-CM

## 2011-05-23 LAB — CBC WITH DIFFERENTIAL/PLATELET
Basophils Absolute: 0 10*3/uL (ref 0.0–0.1)
EOS%: 1.4 % (ref 0.0–7.0)
Eosinophils Absolute: 0.1 10*3/uL (ref 0.0–0.5)
HCT: 35.4 % (ref 34.8–46.6)
HGB: 12.5 g/dL (ref 11.6–15.9)
MCH: 32.2 pg (ref 25.1–34.0)
MONO#: 0.3 10*3/uL (ref 0.1–0.9)
NEUT#: 3.3 10*3/uL (ref 1.5–6.5)
NEUT%: 73 % (ref 38.4–76.8)
RDW: 14 % (ref 11.2–14.5)
lymph#: 0.8 10*3/uL — ABNORMAL LOW (ref 0.9–3.3)

## 2011-05-23 LAB — COMPREHENSIVE METABOLIC PANEL
ALT: 12 U/L (ref 0–35)
AST: 13 U/L (ref 0–37)
Albumin: 4.3 g/dL (ref 3.5–5.2)
CO2: 26 mEq/L (ref 19–32)
Calcium: 9.9 mg/dL (ref 8.4–10.5)
Chloride: 104 mEq/L (ref 96–112)
Potassium: 4.3 mEq/L (ref 3.5–5.3)
Total Protein: 6.5 g/dL (ref 6.0–8.3)

## 2011-06-20 ENCOUNTER — Ambulatory Visit (HOSPITAL_COMMUNITY)
Admission: RE | Admit: 2011-06-20 | Discharge: 2011-06-20 | Disposition: A | Discharge status: Home / Self Care | Payer: BC Managed Care – PPO | Source: Ambulatory Visit | Attending: Oncology | Admitting: Oncology

## 2011-06-20 DIAGNOSIS — R5381 Other malaise: Secondary | ICD-10-CM | POA: Insufficient documentation

## 2011-06-20 DIAGNOSIS — R5383 Other fatigue: Secondary | ICD-10-CM | POA: Insufficient documentation

## 2011-06-20 DIAGNOSIS — C3490 Malignant neoplasm of unspecified part of unspecified bronchus or lung: Secondary | ICD-10-CM | POA: Insufficient documentation

## 2011-06-20 MED ORDER — IOHEXOL 300 MG/ML  SOLN
80.0000 mL | Freq: Once | INTRAMUSCULAR | Status: AC | PRN
  Administered 2011-06-20: 80 mL via INTRAVENOUS

## 2011-07-22 ENCOUNTER — Other Ambulatory Visit (HOSPITAL_COMMUNITY): Payer: Self-pay | Admitting: Oncology

## 2011-07-22 ENCOUNTER — Ambulatory Visit (HOSPITAL_BASED_OUTPATIENT_CLINIC_OR_DEPARTMENT_OTHER): Payer: BC Managed Care – PPO | Admitting: Oncology

## 2011-07-22 DIAGNOSIS — C7931 Secondary malignant neoplasm of brain: Secondary | ICD-10-CM

## 2011-07-22 DIAGNOSIS — C3410 Malignant neoplasm of upper lobe, unspecified bronchus or lung: Secondary | ICD-10-CM

## 2011-07-22 DIAGNOSIS — C3490 Malignant neoplasm of unspecified part of unspecified bronchus or lung: Secondary | ICD-10-CM

## 2011-07-22 LAB — COMPREHENSIVE METABOLIC PANEL
Albumin: 4.2 g/dL (ref 3.5–5.2)
Alkaline Phosphatase: 86 U/L (ref 39–117)
BUN: 17 mg/dL (ref 6–23)
CO2: 24 mEq/L (ref 19–32)
Glucose, Bld: 89 mg/dL (ref 70–99)
Total Bilirubin: 0.4 mg/dL (ref 0.3–1.2)

## 2011-07-22 LAB — CBC WITH DIFFERENTIAL/PLATELET
BASO%: 0.5 % (ref 0.0–2.0)
Basophils Absolute: 0 10*3/uL (ref 0.0–0.1)
EOS%: 1.1 % (ref 0.0–7.0)
HCT: 35.8 % (ref 34.8–46.6)
LYMPH%: 17.4 % (ref 14.0–49.7)
MCH: 32.6 pg (ref 25.1–34.0)
MCHC: 35.2 g/dL (ref 31.5–36.0)
MCV: 92.5 fL (ref 79.5–101.0)
NEUT%: 74.4 % (ref 38.4–76.8)
Platelets: 223 10*3/uL (ref 145–400)

## 2011-09-08 ENCOUNTER — Encounter: Payer: Self-pay | Admitting: Oncology

## 2011-09-15 ENCOUNTER — Encounter: Payer: Self-pay | Admitting: Physician Assistant

## 2011-09-15 DIAGNOSIS — C7949 Secondary malignant neoplasm of other parts of nervous system: Secondary | ICD-10-CM | POA: Insufficient documentation

## 2011-09-15 DIAGNOSIS — C3490 Malignant neoplasm of unspecified part of unspecified bronchus or lung: Secondary | ICD-10-CM | POA: Insufficient documentation

## 2011-09-15 DIAGNOSIS — C7931 Secondary malignant neoplasm of brain: Secondary | ICD-10-CM

## 2011-09-19 ENCOUNTER — Other Ambulatory Visit (HOSPITAL_BASED_OUTPATIENT_CLINIC_OR_DEPARTMENT_OTHER): Payer: BC Managed Care – PPO | Admitting: Lab

## 2011-09-19 ENCOUNTER — Other Ambulatory Visit (HOSPITAL_COMMUNITY): Payer: Self-pay | Admitting: Oncology

## 2011-09-19 ENCOUNTER — Ambulatory Visit (HOSPITAL_BASED_OUTPATIENT_CLINIC_OR_DEPARTMENT_OTHER): Payer: BC Managed Care – PPO | Admitting: Physician Assistant

## 2011-09-19 ENCOUNTER — Telehealth: Payer: Self-pay | Admitting: Oncology

## 2011-09-19 VITALS — BP 107/75 | HR 88 | Temp 97.5°F | Ht 69.0 in | Wt 159.2 lb

## 2011-09-19 DIAGNOSIS — T451X5A Adverse effect of antineoplastic and immunosuppressive drugs, initial encounter: Secondary | ICD-10-CM

## 2011-09-19 DIAGNOSIS — C342 Malignant neoplasm of middle lobe, bronchus or lung: Secondary | ICD-10-CM

## 2011-09-19 DIAGNOSIS — C3490 Malignant neoplasm of unspecified part of unspecified bronchus or lung: Secondary | ICD-10-CM

## 2011-09-19 DIAGNOSIS — G620 Drug-induced polyneuropathy: Secondary | ICD-10-CM

## 2011-09-19 LAB — COMPREHENSIVE METABOLIC PANEL
ALT: 13 U/L (ref 0–35)
Albumin: 4.1 g/dL (ref 3.5–5.2)
CO2: 23 mEq/L (ref 19–32)
Calcium: 9.4 mg/dL (ref 8.4–10.5)
Chloride: 105 mEq/L (ref 96–112)
Glucose, Bld: 89 mg/dL (ref 70–99)
Sodium: 139 mEq/L (ref 135–145)
Total Bilirubin: 0.2 mg/dL — ABNORMAL LOW (ref 0.3–1.2)
Total Protein: 6.1 g/dL (ref 6.0–8.3)

## 2011-09-19 LAB — CBC WITH DIFFERENTIAL/PLATELET
Basophils Absolute: 0 10*3/uL (ref 0.0–0.1)
Eosinophils Absolute: 0.1 10*3/uL (ref 0.0–0.5)
HGB: 12.5 g/dL (ref 11.6–15.9)
MCV: 92.2 fL (ref 79.5–101.0)
MONO#: 0.3 10*3/uL (ref 0.1–0.9)
MONO%: 6.8 % (ref 0.0–14.0)
NEUT#: 3.5 10*3/uL (ref 1.5–6.5)
Platelets: 211 10*3/uL (ref 145–400)
RBC: 3.84 10*6/uL (ref 3.70–5.45)
RDW: 13.6 % (ref 11.2–14.5)
WBC: 4.7 10*3/uL (ref 3.9–10.3)

## 2011-09-19 LAB — CEA: CEA: 0.7 ng/mL (ref 0.0–5.0)

## 2011-09-19 LAB — LACTATE DEHYDROGENASE: LDH: 128 U/L (ref 94–250)

## 2011-09-19 NOTE — Progress Notes (Signed)
CC:   Hailey Hill, M.D. [name], M.D.  INTERIM HISTORY:  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  Since her last clinic visit in September 2012, she reports overall normal energy level.  She states she has been exercising 6 days a week by walking 3 miles on her treadmill.  She has had no fevers, chills, or night sweats.  No dyspnea or cough.  She reports normal appetite and has had no problems with nausea, vomiting, constipation, or diarrhea.  No dysuria, no frequency or hematuria.  She continues to report ongoing issues with numbness and tingling in her hands and feet, which she states is painful at times and is like an electrical sensation.  She, however, states she is not willing to take Lyrica or Neurontin due to the possible side effects.  She also continues to use Restoril 30 mg p.o. at bedtime as needed for sleep, but states she does not have to take this every night.  Other medications are reviewed and recorded.  Also of note, the patient states that she has a new primary care physician and will be going in for a physical later this month.  She does not have all the information about the new provider, but will make sure that Dr. Arline Asp is courtesy copied the note after her clinic visit.  PHYSICAL EXAMINATION:  Vital Signs:  Temperature is 97.5, heart rate 88, respirations 20, blood pressure 107/75, weight 159.3 pounds, oxygen saturation of 100% on room air.  General:  This is a well-developed, well-nourished white female in no acute distress.  HEENT:  Sclerae are nonicteric.  There is no thrush or mucositis.  Skin:  No rashes or lesions.  Lymph:  No cervical, supraclavicular, axillary, or inguinal lymphadenopathy.  Cardiac:  Regular rate and rhythm without murmurs or gallops.  Peripheral pulses are 2+.  Chest:  Lungs are clear to auscultation.   Abdomen:  Positive bowel sounds.  Soft, nontender, nondistended.  No organomegaly.  Extremities:  Without edema, cyanosis, or calf tenderness.  Neuro:  Alert and oriented x3.  Strength, sensation, and coordination all grossly intact.  LABORATORIES:  CBC with diff reveals white blood count of 4.7, hemoglobin of 12.5, hematocrit of 35.4, platelets of 211, ANC of 3.5, and MCV of 92.2.  CMET, LDH, and CEA are currently pending.  IMPRESSIONS/PLAN: 1. [name] is a 60 year old white female with small cell lung     cancer diagnosed by biopsy July 12, 2010, when she presented     with superior vena cava syndrome.  She received 6 cycles of     chemotherapy, initially 1 cycle of carboplatin and VP-16, then     switched to cisplatin/VP-16, completing chemotherapy December 2011,     followed by external radiation therapy to the chest between     October 04, 2010, and November 19, 2009, as well as whole brain     radiation completed February 58.  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 2. The patient's last CT scan of the chest June 20, 2011, revealed     no evidence of recurrent tumors.  There were subtle changes in the     region of the  AP window with possibility of tumor certainly not     able to be excluded.  Per Dr. Arline Asp, we will plan to reassess CT scans every 6     months for now. 3. The patient will be scheduled for followup with Dr. Arline Asp in 2     months' time, at which time we will     reassess CBC with diff, CMET, LDH, and CEA.  She is advised to call     in the interim if any questions or problems.    ______________________________ Sherilyn Banker, MSN, ANP, BC RJ/MEDQ  D:  09/19/2011  T:  09/19/2011  Job:  [id number]

## 2011-09-19 NOTE — Progress Notes (Signed)
This office note has been dictated.

## 2011-09-19 NOTE — Telephone Encounter (Signed)
gv pt appt schedule for jan °

## 2011-10-03 ENCOUNTER — Telehealth: Payer: Self-pay | Admitting: Medical Oncology

## 2011-10-03 NOTE — Telephone Encounter (Signed)
I called pt to let her know that medical records is getting her records ready for pick up today. They will give her a call. She voiced understanding.

## 2011-10-03 NOTE — Telephone Encounter (Signed)
Pt called and states that she was here in our office 2 weeks ago. She signed a release of information to get her records for a new MD. She has not heard a word and she needs them for tomorrow.

## 2011-10-05 ENCOUNTER — Other Ambulatory Visit: Payer: Self-pay | Admitting: Physician Assistant

## 2011-10-05 ENCOUNTER — Other Ambulatory Visit (HOSPITAL_COMMUNITY)
Admission: RE | Admit: 2011-10-05 | Discharge: 2011-10-05 | Disposition: A | Discharge status: Home / Self Care | Payer: PRIVATE HEALTH INSURANCE | Source: Ambulatory Visit | Attending: Family Medicine | Admitting: Family Medicine

## 2011-10-05 DIAGNOSIS — Z0000 Encounter for general adult medical examination without abnormal findings: Secondary | ICD-10-CM | POA: Insufficient documentation

## 2011-11-21 ENCOUNTER — Telehealth: Payer: Self-pay | Admitting: Oncology

## 2011-11-21 ENCOUNTER — Ambulatory Visit (HOSPITAL_BASED_OUTPATIENT_CLINIC_OR_DEPARTMENT_OTHER): Payer: BC Managed Care – PPO | Admitting: Oncology

## 2011-11-21 ENCOUNTER — Encounter: Payer: Self-pay | Admitting: Oncology

## 2011-11-21 ENCOUNTER — Encounter: Payer: Self-pay | Admitting: Radiation Oncology

## 2011-11-21 ENCOUNTER — Other Ambulatory Visit (HOSPITAL_BASED_OUTPATIENT_CLINIC_OR_DEPARTMENT_OTHER): Payer: BC Managed Care – PPO | Admitting: Lab

## 2011-11-21 VITALS — BP 118/74 | HR 99 | Temp 98.1°F | Ht 69.0 in | Wt 167.6 lb

## 2011-11-21 DIAGNOSIS — C3490 Malignant neoplasm of unspecified part of unspecified bronchus or lung: Secondary | ICD-10-CM

## 2011-11-21 LAB — CBC WITH DIFFERENTIAL/PLATELET
BASO%: 0.3 % (ref 0.0–2.0)
EOS%: 1.2 % (ref 0.0–7.0)
MCH: 31.8 pg (ref 25.1–34.0)
MCHC: 34.8 g/dL (ref 31.5–36.0)
MONO#: 0.3 10*3/uL (ref 0.1–0.9)
NEUT%: 80.3 % — ABNORMAL HIGH (ref 38.4–76.8)
RBC: 3.91 10*6/uL (ref 3.70–5.45)
RDW: 13.9 % (ref 11.2–14.5)
WBC: 6.2 10*3/uL (ref 3.9–10.3)
lymph#: 0.9 10*3/uL (ref 0.9–3.3)

## 2011-11-21 LAB — COMPREHENSIVE METABOLIC PANEL
ALT: 15 U/L (ref 0–35)
AST: 13 U/L (ref 0–37)
CO2: 28 mEq/L (ref 19–32)
Calcium: 9.4 mg/dL (ref 8.4–10.5)
Chloride: 104 mEq/L (ref 96–112)
Creatinine, Ser: 0.94 mg/dL (ref 0.50–1.10)
Potassium: 4.4 mEq/L (ref 3.5–5.3)
Sodium: 141 mEq/L (ref 135–145)
Total Protein: 6.4 g/dL (ref 6.0–8.3)

## 2011-11-21 NOTE — Progress Notes (Signed)
CC:   Stacie Acres. Cliffton Asters, M.D. [name], M.D. [name], M.D.  HISTORY:  I saw Keniyah Gelinas today for followup of her small cell carcinoma of the lung which presented with superior vena cava syndrome in late August 2011.  Treatments with 6 cycles of cisplatin and VP-16 we were completed from 07/14/2010 through 11/02/2010.  Since then, the patient is continued to do well with no signs of recurrent disease.  Her most recent CT scan of the chest with IV contrast was carried out on June 20, 2011.  Cashe's main problem seems to be persistent sensory neuropathy involving her feet and toes.  She has numbness and paresthesias.  I have offered her Neurontin and Lyrica which she has declined.  Persephanie is without any complaints today.  She feels fine with no respiratory symptoms.  She is active, working out, exercising, eating well and, in fact, has gained some weight above her normal baseline.  Ninette tells me she has an internist, Dr. Laurann Montana.  Kameron received Pneumovax and flu shot in late November.  She is scheduled to have bone density scan and mammogram soon.  Apparently she is also on weekly vitamin D.  PROBLEM LIST: 1. Small cell carcinoma of the lung presenting with superior vena cava     syndrome with biopsy carried out on 07/12/2010.  Teriah also had     thrombus in the left subclavian vein and was treated with Lovenox     for several months.  She received 6 cycles of chemotherapy with     cisplatin and VP-16 in combination with Neulasta from 07/14/2010     through 11/02/2010.  She received radiation treatments 6000 cGy in 30         fractions to the chest     from 10/04/2010 through 11/19/2010 and then prophylactic whole-     brain radiation 2500 cGy in 10 fractions from 12/07/2010 through     12/20/2010.  Her most recent CT scan of the chest was on 06/20/2011     and there was no evidence for recurrent tumor. 2. History of superior vena cava syndrome. 3.  Thrombus involving left subclavian vein September 2011. 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 5. Dyslipidemia.  MEDICATIONS: 1. Aspirin 81 mg daily. 2. Crestor 10 mg daily. 3. Vitamin D taken weekly, probably 50,000 units but we are not sure     of the dose at this time. 4. Restoril 30 mg as needed at bedtime.  PHYSICAL EXAM:  Christyl looks well.  She has gained weight, currently 167.6 pounds up from 159 pounds when she was last seen here on 09/19/2011.  Height 69 inches, body surface area 1.92 meters square.  O2 saturation on room air 99%.  Other vital signs are normal.  There is no evidence of facial swelling or jugular venous distention to suggest recurrent superior vena cava syndrome.  Khushbu looks well.  No scleral icterus.  Mouth and pharynx benign.  No peripheral adenopathy palpable. No axillary adenopathy.  Teofila is due for mammograms.  Breasts were not examined.  Marolyn does not have a port or central catheter. Heart/lungs:  Normal.  Abdomen:  Benign with no organomegaly or masses palpable.  Extremities:  No peripheral edema.  LABORATORY DATA:  Today, white count 6.2, ANC 5.0, hemoglobin 12.4, hematocrit 35.7, platelets 212,000.  Chemistries and CEA are pending. Chemistries from 09/19/2011 were normal.  CEA on 11/12 was 0.7.  IMAGING STUDIES:  CT scan of the chest  with IV contrast on 06/20/2011 showed residual ill-defined soft tissue in the mediastinum unchanged from 12/14/2010.  There were evolving changes of radiation in the medial left hemithorax and there was small anterior pericardial fluid.  CT scans have been reviewed with a radiologist.  Subtle changes were seen in the region of the AP window and measured 2.0 x 2.1 cm.  IMPRESSION AND PLAN:  Neddie continues to do well now on 16-17 months following the time of diagnosis without evidence of disease.  Hayley is feeling quite well at the present time.  She is currently on disability but would like  to return to work in some capacity.  She tells me that she has not smoked in about 20 months.  We will plan to see Citlalli again in approximately 2 months at which time we will check CBC and chemistries.  We will schedule Tamicka for another CT scan of the chest with IV contrast about 1 week prior to her appointment.    ______________________________ Samul Dada, M.D. DSM/MEDQ  D:  11/21/2011  T:  11/21/2011  Job:  [id number]

## 2011-11-21 NOTE — Telephone Encounter (Signed)
appt made for ct lab md and printed for 01/2012   aom

## 2011-11-21 NOTE — Progress Notes (Signed)
Note: This end of treatment summary was created approximately one year after completion of therapy. This is for documentation purposes only. [facility]  Name:Hailey Hill  Date:11/21/2011           ZOX:[id number] DOB:February 17, 1951   Status:outpatient   CC: Kimberlee Nearing, MD  DIAGNOSIS: Stage IIIB small cell lung cancer   INDICATION FOR TREATMENT: Local regional control  TREATMENT DATES:  10/04/2010 through 11/19/2010                      SITE/DOSE:              Chest/ 6000 cGy in 30 fractions             BEAMS/ENERGY:   3-D conformal  /6 and 18 MV photons  NARRATIVE:    Ms. Racey tolerated her radiotherapy reasonably well. She did have nausea and neutropenia felt to be secondary to her chemotherapy.                       PLAN: Routine followup was scheduled for approximately one month.

## 2012-01-04 ENCOUNTER — Telehealth: Payer: Self-pay | Admitting: Oncology

## 2012-01-04 NOTE — Telephone Encounter (Signed)
l/m that 3/25 appt has been cx and r/s to 4/1   aom

## 2012-01-05 ENCOUNTER — Telehealth: Payer: Self-pay | Admitting: Oncology

## 2012-01-05 NOTE — Telephone Encounter (Signed)
pt called and l/m to r/s 4/1,now l/m for her to c/b    aom

## 2012-01-10 ENCOUNTER — Telehealth: Payer: Self-pay | Admitting: Oncology

## 2012-01-10 NOTE — Telephone Encounter (Signed)
pt called to ck on 4/1 appt and to see if we have something sooner.  Per rb dr dm is going to work all day on 3/22 pt put at 3:30/4:00,pt aware aom

## 2012-01-20 ENCOUNTER — Telehealth: Payer: Self-pay | Admitting: Oncology

## 2012-01-20 NOTE — Telephone Encounter (Signed)
l/m that her 3/22 appt had been moved up to 8:30     aom

## 2012-01-24 ENCOUNTER — Ambulatory Visit (HOSPITAL_COMMUNITY)
Admission: RE | Admit: 2012-01-24 | Discharge: 2012-01-24 | Disposition: A | Discharge status: Home / Self Care | Payer: BC Managed Care – PPO | Source: Ambulatory Visit | Attending: Oncology | Admitting: Oncology

## 2012-01-24 DIAGNOSIS — C3490 Malignant neoplasm of unspecified part of unspecified bronchus or lung: Secondary | ICD-10-CM

## 2012-01-24 DIAGNOSIS — K7689 Other specified diseases of liver: Secondary | ICD-10-CM | POA: Insufficient documentation

## 2012-01-24 DIAGNOSIS — R911 Solitary pulmonary nodule: Secondary | ICD-10-CM | POA: Insufficient documentation

## 2012-01-24 DIAGNOSIS — D1809 Hemangioma of other sites: Secondary | ICD-10-CM | POA: Insufficient documentation

## 2012-01-24 DIAGNOSIS — M948X9 Other specified disorders of cartilage, unspecified sites: Secondary | ICD-10-CM | POA: Insufficient documentation

## 2012-01-24 MED ORDER — IOHEXOL 300 MG/ML  SOLN
80.0000 mL | Freq: Once | INTRAMUSCULAR | Status: AC | PRN
  Administered 2012-01-24: 80 mL via INTRAVENOUS

## 2012-01-26 ENCOUNTER — Telehealth: Payer: Self-pay

## 2012-01-26 NOTE — Telephone Encounter (Signed)
S/w pt and told her the CT scan results.

## 2012-01-26 NOTE — Telephone Encounter (Signed)
lvm CT results were good

## 2012-01-27 ENCOUNTER — Telehealth: Payer: Self-pay | Admitting: Oncology

## 2012-01-27 ENCOUNTER — Other Ambulatory Visit (HOSPITAL_BASED_OUTPATIENT_CLINIC_OR_DEPARTMENT_OTHER): Payer: BC Managed Care – PPO | Admitting: Lab

## 2012-01-27 ENCOUNTER — Other Ambulatory Visit: Payer: Self-pay

## 2012-01-27 ENCOUNTER — Encounter: Payer: Self-pay | Admitting: Oncology

## 2012-01-27 ENCOUNTER — Ambulatory Visit (HOSPITAL_BASED_OUTPATIENT_CLINIC_OR_DEPARTMENT_OTHER): Payer: BC Managed Care – PPO | Admitting: Oncology

## 2012-01-27 VITALS — BP 113/79 | HR 90 | Temp 96.8°F | Ht 69.0 in | Wt 172.3 lb

## 2012-01-27 DIAGNOSIS — C3490 Malignant neoplasm of unspecified part of unspecified bronchus or lung: Secondary | ICD-10-CM

## 2012-01-27 DIAGNOSIS — R609 Edema, unspecified: Secondary | ICD-10-CM

## 2012-01-27 DIAGNOSIS — Z85118 Personal history of other malignant neoplasm of bronchus and lung: Secondary | ICD-10-CM

## 2012-01-27 LAB — CBC WITH DIFFERENTIAL/PLATELET
Basophils Absolute: 0 10*3/uL (ref 0.0–0.1)
EOS%: 1.5 % (ref 0.0–7.0)
Eosinophils Absolute: 0.1 10*3/uL (ref 0.0–0.5)
HGB: 13.5 g/dL (ref 11.6–15.9)
LYMPH%: 18.3 % (ref 14.0–49.7)
MCH: 31.2 pg (ref 25.1–34.0)
MCV: 90.8 fL (ref 79.5–101.0)
MONO%: 6.1 % (ref 0.0–14.0)
NEUT#: 3.9 10*3/uL (ref 1.5–6.5)
Platelets: 205 10*3/uL (ref 145–400)
RBC: 4.32 10*6/uL (ref 3.70–5.45)

## 2012-01-27 LAB — COMPREHENSIVE METABOLIC PANEL
AST: 15 U/L (ref 0–37)
Alkaline Phosphatase: 93 U/L (ref 39–117)
BUN: 17 mg/dL (ref 6–23)
Glucose, Bld: 94 mg/dL (ref 70–99)
Total Bilirubin: 0.4 mg/dL (ref 0.3–1.2)

## 2012-01-27 MED ORDER — TEMAZEPAM 30 MG PO CAPS: 30.0000 mg | ORAL_CAPSULE | Freq: Every evening | ORAL | Status: DC | PRN

## 2012-01-27 MED ORDER — FUROSEMIDE 20 MG PO TABS: 20.0000 mg | ORAL_TABLET | ORAL | Status: DC

## 2012-01-27 NOTE — Progress Notes (Signed)
This office note has been dictated.  #[id number]

## 2012-01-27 NOTE — Progress Notes (Signed)
CC:   Hailey Hill. Hailey Hill, M.D. Hailey Hill, M.D. Hailey Hill, M.D.  PROBLEM LIST: 1. Small cell carcinoma of the lung presenting with superior vena cava     syndrome with biopsy carried out on 07/12/2010.  Hailey Hill also had     thrombus in the left subclavian vein and was treated with Lovenox     for several months.  She received 6 cycles of chemotherapy with     cisplatin and VP-16 in combination with Neulasta from 07/14/2010     through 11/02/2010.  She received radiation treatments to the chest     from 10/04/2010 through 11/19/2010 and then prophylactic whole-     brain radiation 2500 cGy in 10 fractions from 12/07/2010 through     12/20/2010.  Hailey Hill's most recent CT scan of the chest was carried     out on 01/24/2012 and showed no evidence of disease. 2. History of superior vena cava syndrome. 3. Thrombus involving left subclavian vein September 2011. 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 5. Dyslipidemia.  MEDICATIONS: 1. Aspirin 81 mg daily. 2. Crestor 10 mg daily. 3. Vitamin D taken weekly, probably 50,000 units but we are not sure     of the dose at this time. 4. Restoril 30 mg as needed at bedtime.  HISTORY:  Hailey Hill was seen today for followup of her small cell carcinoma of the lung which presented with superior vena cava syndrome in late August 2011.  Hailey Hill remains without evidence of disease.  This was confirmed by a CT scan of the chest on 01/24/2012.  Hailey Hill feels quite well.  Her main complaint has been some pain in her left heel, which she feels is a plantar fasciitis.  This seems to be getting better.  Hailey Hill also has had some mild swelling of both legs, left greater than right.  She denies any pain in her calves to suggest DVT.  We are giving Hailey Hill a prescription for Lasix 20 mg to take every other day.  We are also refilling her Restoril today.  Hailey Hill denies any sense of ill health or respiratory problems.  She did not  mention sensory peripheral neuropathy, which we had attributed to chemotherapy from cisplatin. Hailey Hill has gained some weight beyond her baseline and attributes this to difficulty exercising because of her left heel pain.  PHYSICAL EXAMINATION:  General:  Hailey Hill looks well and is in good spirits.  Her weight today is 172.3 pounds, height 5 feet 9 inches, body surface area 1.95 sq m.  Vital Signs:  Blood pressure 113/79.  Other vital signs are normal.  HEENT:  There is no scleral icterus.  Mouth and pharynx are benign.  No facial swelling or jugular venous distention to suggest superior vena cava syndrome.  Heart and Lungs:  Normal.  Hailey Hill does not have a Port-A-Cath or central catheter.  Breasts: Not examined. Abdomen:  Benign.  Extremities:  Trace plus edema of both lower legs extending up to about the level of the knee.  Legs feel full.  There is no calf tenderness.  Homans sign is negative.  Neurologic Exam: Nonfocal and grossly normal.  LABORATORY DATA:  Today, white count 5.4, ANC 3.9, hemoglobin 13.5, hematocrit 39.2, platelets 205,000.  Chemistries are pending. Chemistries from 11/21/2011 were normal, as was a CEA which was less than 0.5.  IMAGING STUDIES: 1. MRI of the head with and without IV contrast from 11/26/2010 showed     no evidence for metastatic disease or any  intracranial abnormality. 2. CT scan of the chest with contrast on 12/14/2010 showed marked     response to therapy within the chest when compared with the PET     scan of 07/13/2010 and chest CT scan of 07/10/2010. 3. CT scan of the chest with contrast on 06/20/2011 showed some     residual ill-defined soft tissue in the mediastinum felt to be     unchanged from the CT scan of 12/14/2010.  There were evolving     changes of radiation therapy in the medial left hemithorax.  There     was a small anterior pericardial effusion. 4. CT scan of the chest with IV contrast on 01/24/2012 showed no     evidence for  metastatic disease within the chest.  The previously     described AP window mediastinal soft tissue thickening has improved     and has nearly resolved.  The patient has some bilateral pulmonary     nodules which are stable.  IMPRESSION AND PLAN:  I am delighted at how well Hailey Hill is doing, with no evidence of disease at this time, now approximately a year and a half from the time of diagnosis.  She feels quite well.  She has not smoked since the time of her diagnosis.  We gave Hailey Hill a prescription for Lasix to take 20 mg every 2 or 3 days. I also refilled her Restoril 30 mg for sleep.  Reasons for her bilateral swelling in her legs are not clear.  I told her that if she develops any pain in the calf region or if the swelling is persisting or getting worse that she may need to have a Doppler study, and to notify us or her primary physician, Dr. Laurann Montana.  We will plan to see Hailey Hill again in 2 months, at which time we will check CBC, chemistries and CEA.    ______________________________ Samul Dada, M.D. DSM/MEDQ  D:  01/27/2012  T:  01/27/2012  Job:  [id number]

## 2012-01-27 NOTE — Telephone Encounter (Signed)
gv pt appt schedule for may.  °

## 2012-01-30 ENCOUNTER — Ambulatory Visit: Payer: BC Managed Care – PPO | Admitting: Oncology

## 2012-01-30 ENCOUNTER — Other Ambulatory Visit: Payer: BC Managed Care – PPO

## 2012-02-06 ENCOUNTER — Other Ambulatory Visit: Payer: BC Managed Care – PPO | Admitting: Lab

## 2012-02-06 ENCOUNTER — Ambulatory Visit: Payer: BC Managed Care – PPO | Admitting: Oncology

## 2012-03-27 ENCOUNTER — Telehealth: Payer: Self-pay | Admitting: Oncology

## 2012-03-27 ENCOUNTER — Encounter: Payer: Self-pay | Admitting: Oncology

## 2012-03-27 ENCOUNTER — Ambulatory Visit (HOSPITAL_BASED_OUTPATIENT_CLINIC_OR_DEPARTMENT_OTHER): Payer: BC Managed Care – PPO | Admitting: Oncology

## 2012-03-27 ENCOUNTER — Other Ambulatory Visit: Payer: BC Managed Care – PPO | Admitting: Lab

## 2012-03-27 ENCOUNTER — Other Ambulatory Visit: Payer: Self-pay | Admitting: Oncology

## 2012-03-27 VITALS — BP 100/74 | HR 78 | Temp 97.2°F | Ht 69.0 in | Wt 166.7 lb

## 2012-03-27 DIAGNOSIS — C3490 Malignant neoplasm of unspecified part of unspecified bronchus or lung: Secondary | ICD-10-CM

## 2012-03-27 DIAGNOSIS — Z1231 Encounter for screening mammogram for malignant neoplasm of breast: Secondary | ICD-10-CM

## 2012-03-27 LAB — CBC WITH DIFFERENTIAL/PLATELET
Basophils Absolute: 0 10*3/uL (ref 0.0–0.1)
Eosinophils Absolute: 0.1 10*3/uL (ref 0.0–0.5)
HGB: 13.8 g/dL (ref 11.6–15.9)
MCV: 91.5 fL (ref 79.5–101.0)
MONO#: 0.3 10*3/uL (ref 0.1–0.9)
MONO%: 5.6 % (ref 0.0–14.0)
NEUT#: 4.5 10*3/uL (ref 1.5–6.5)
RBC: 4.45 10*6/uL (ref 3.70–5.45)
RDW: 13.7 % (ref 11.2–14.5)
WBC: 5.6 10*3/uL (ref 3.9–10.3)
lymph#: 0.8 10*3/uL — ABNORMAL LOW (ref 0.9–3.3)
nRBC: 0 % (ref 0–0)

## 2012-03-27 LAB — COMPREHENSIVE METABOLIC PANEL
ALT: 14 U/L (ref 0–35)
Albumin: 4.4 g/dL (ref 3.5–5.2)
CO2: 28 mEq/L (ref 19–32)
Calcium: 9.5 mg/dL (ref 8.4–10.5)
Chloride: 103 mEq/L (ref 96–112)
Glucose, Bld: 106 mg/dL — ABNORMAL HIGH (ref 70–99)
Potassium: 4.3 mEq/L (ref 3.5–5.3)
Sodium: 139 mEq/L (ref 135–145)
Total Bilirubin: 0.6 mg/dL (ref 0.3–1.2)
Total Protein: 6.6 g/dL (ref 6.0–8.3)

## 2012-03-27 LAB — CEA: CEA: <0.5 ng/mL (ref 0.0–5.0)

## 2012-03-27 LAB — LACTATE DEHYDROGENASE: LDH: 157 U/L (ref 94–250)

## 2012-03-27 NOTE — Progress Notes (Signed)
This office note has been dictated.  #[id number]

## 2012-03-27 NOTE — Telephone Encounter (Signed)
appts made and printed for pt aom °

## 2012-03-27 NOTE — Progress Notes (Signed)
CC:   Hailey Hill. Hailey Hill, M.D. Hailey Hill, M.D. Hailey Hill Hailey Hill, M.D.  PROBLEM LIST:  1. 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 Hailey Hill also had  thrombus in the left subclavian vein and was treated with Lovenox  for several months. She received 6 cycles of chemotherapy with  cisplatin and VP-16 in combination with Neulasta from 07/14/2010  through 11/02/2010. She received radiation treatments to the chest  from 10/04/2010 through 11/19/2010 and then prophylactic whole-  brain radiation 2500 cGy in 10 fractions from 12/07/2010 through  12/20/2010. Hailey Hill's most recent CT scan of the chest was carried  out on 01/24/2012 and showed no evidence of disease.  2. History of superior vena cava syndrome.  3. Thrombus involving left subclavian vein September 2011.  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  5. Dyslipidemia.   MEDICATIONS: 1. Aspirin 81 mg daily. 2. Lasix 20 mg every other day for peripheral edema. 3. Crestor 10 mg daily. 4. Restoril 30 mg at bedtime as needed. 5. Ergocalciferol 50,000 units weekly.  HISTORY:  I saw Hailey Hill today for followup of Hailey Hill small cell carcinoma of the lung which presented with superior vena cava syndrome in late August 2011.  Hailey Hill was last seen by [place] on 01/27/2012.  She remains free of evidence of disease.  Clinically, she is doing well with no symptoms whatsoever at this time.  She denies any chest pain or other pains.  No cough shortness of breath.  No sense of ill health.  Appetite is good.  The swelling that she was having in Hailey Hill legs has responded to Lasix every other day.  She still has symptoms of Hailey Hill's sensory peripheral neuropathy due to cisplatin.  The patient has had treatment for plantar fasciitis.  PHYSICAL EXAM:  General:  Hailey Hill looks well and is in good spirits.  She tells me that she will be going to [place] races in  [place] over the [place].  Weight is 166.7 pounds, height 5 feet 9 inches, body surface area 1.92 m squared.  Blood pressure 100/74.  Other vital signs are normal.  HEENT:  There is no scleral icterus or facial swelling.  No jugular venous distention.  Mouth and pharynx are benign. Neck:  Without adenopathy.  Heart and lungs:  Normal.  Hailey Hill does not have a Port-A-Cath or central catheter.  Breasts:  Not examined.  Anwitha is due for mammograms.  Abdomen:  Benign with no organomegaly or masses palpable.  Extremities:  No peripheral edema of the legs.  There may be a little puffiness of the ankles.  Neurologic:  Exam is normal.  LABORATORY DATA:  Today, white count 5.6, ANC 4.5, hemoglobin 13.8, hematocrit 40.7, platelets 209,000.  Chemistries and CEA today are pending.  Chemistries from 01/27/2012 were normal.  CEA on 11/21/2011 was less than 0.5.  IMAGING STUDIES:  1. MRI of the head with and without IV contrast from 11/26/2010 showed  no evidence for metastatic disease or any intracranial abnormality.  2. CT scan of the chest with contrast on 12/14/2010 showed marked  response to therapy within the chest when compared with the PET  scan of 07/13/2010 and chest CT scan of 07/10/2010.  3. CT scan of the chest with contrast on 06/20/2011 showed some  residual ill-defined soft tissue in the mediastinum felt to be  unchanged from the CT scan of 12/14/2010. There were evolving  changes of radiation  therapy in the medial left hemithorax. There  was a small anterior pericardial effusion.  4. CT scan of the chest with IV contrast on 01/24/2012 showed no  evidence for metastatic disease within the chest. The previously  described AP window mediastinal soft tissue thickening has improved  and has nearly resolved. The patient has some bilateral pulmonary  nodules which are stable.   IMPRESSION AND PLAN:  Haely continues to do well, now approaching 2 years from the time of  diagnosis.  She is without evidence of recurrent disease.  As stated, Hailey Hill last CT scan of the chest with IV contrast was on 01/24/2012.  We will plan to see Darren again in 2 months at which time we will check CBC, chemistries and CEA.  I am anticipating repeating a CT scan of the chest somewhere around the early fall of this year.    ______________________________ Samul Dada, M.D. DSM/MEDQ  D:  03/27/2012  T:  03/27/2012  Job:  [id number]

## 2012-04-17 ENCOUNTER — Ambulatory Visit
Admission: RE | Admit: 2012-04-17 | Discharge: 2012-04-17 | Disposition: A | Discharge status: Home / Self Care | Payer: BC Managed Care – PPO | Source: Ambulatory Visit | Attending: Oncology | Admitting: Oncology

## 2012-04-17 DIAGNOSIS — Z1231 Encounter for screening mammogram for malignant neoplasm of breast: Secondary | ICD-10-CM

## 2012-05-25 ENCOUNTER — Telehealth: Payer: Self-pay | Admitting: Oncology

## 2012-05-25 NOTE — Telephone Encounter (Signed)
called pt and l m with a new appt for pt due to tx pt for dr dm  aom

## 2012-06-04 ENCOUNTER — Ambulatory Visit: Payer: BC Managed Care – PPO | Admitting: Oncology

## 2012-06-04 ENCOUNTER — Other Ambulatory Visit: Payer: BC Managed Care – PPO | Admitting: Lab

## 2012-06-07 ENCOUNTER — Other Ambulatory Visit: Payer: BC Managed Care – PPO | Admitting: Lab

## 2012-06-07 ENCOUNTER — Other Ambulatory Visit: Payer: BC Managed Care – PPO

## 2012-06-07 ENCOUNTER — Ambulatory Visit: Payer: BC Managed Care – PPO | Admitting: Oncology

## 2012-06-07 ENCOUNTER — Ambulatory Visit: Payer: BC Managed Care – PPO | Admitting: Family

## 2012-06-08 ENCOUNTER — Encounter: Payer: Self-pay | Admitting: Family

## 2012-06-08 ENCOUNTER — Other Ambulatory Visit (HOSPITAL_BASED_OUTPATIENT_CLINIC_OR_DEPARTMENT_OTHER): Payer: BC Managed Care – PPO | Admitting: Lab

## 2012-06-08 ENCOUNTER — Ambulatory Visit (HOSPITAL_BASED_OUTPATIENT_CLINIC_OR_DEPARTMENT_OTHER): Payer: BC Managed Care – PPO | Admitting: Family

## 2012-06-08 ENCOUNTER — Telehealth: Payer: Self-pay | Admitting: Oncology

## 2012-06-08 VITALS — BP 118/77 | HR 83 | Temp 97.0°F | Resp 20 | Ht 69.0 in | Wt 170.7 lb

## 2012-06-08 DIAGNOSIS — C3490 Malignant neoplasm of unspecified part of unspecified bronchus or lung: Secondary | ICD-10-CM

## 2012-06-08 DIAGNOSIS — C3410 Malignant neoplasm of upper lobe, unspecified bronchus or lung: Secondary | ICD-10-CM

## 2012-06-08 LAB — CBC WITH DIFFERENTIAL/PLATELET
BASO%: 0.2 % (ref 0.0–2.0)
EOS%: 1.7 % (ref 0.0–7.0)
HCT: 39 % (ref 34.8–46.6)
LYMPH%: 15.5 % (ref 14.0–49.7)
MCH: 31 pg (ref 25.1–34.0)
MCHC: 35.1 g/dL (ref 31.5–36.0)
NEUT%: 75.6 % (ref 38.4–76.8)
Platelets: 244 10*3/uL (ref 145–400)

## 2012-06-08 LAB — CEA: CEA: <0.5 ng/mL (ref 0.0–5.0)

## 2012-06-08 LAB — COMPREHENSIVE METABOLIC PANEL
ALT: 14 U/L (ref 0–35)
Albumin: 4.3 g/dL (ref 3.5–5.2)
CO2: 27 mEq/L (ref 19–32)
Potassium: 4.5 mEq/L (ref 3.5–5.3)
Sodium: 141 mEq/L (ref 135–145)
Total Bilirubin: 0.5 mg/dL (ref 0.3–1.2)
Total Protein: 6.5 g/dL (ref 6.0–8.3)

## 2012-06-08 LAB — LACTATE DEHYDROGENASE: LDH: 160 U/L (ref 94–250)

## 2012-06-08 NOTE — Progress Notes (Signed)
Patient ID: Hailey Hill, female   DOB: August 03, 1951, 61 y.o.   MRN: [id number] CSN: [id number]  CC: Stacie Acres. Cliffton Asters, M.D.  [name], M.D.  [name], M.D.  Problem List: Hailey Hill is a 61 y.o. Caucasian female with a problem list consisting of: 1. 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  Hailey Hill also had  thrombus in the left subclavian vein and was treated with Lovenox for several months. She received 6 cycles of chemotherapy with  cisplatin and VP-16 in combination with Neulasta from 07/14/2010 through 11/02/2010. She received radiation treatments to the chest  from 10/04/2010 through 11/19/2010 and then prophylactic whole-brain radiation 2500 cGy in 10 fractions from 12/07/2010 through  12/20/2010. Hailey Hill's most recent CT scan of the chest was carried out on 01/24/2012 and showed no evidence of disease.  2. History of superior vena cava syndrome.  3. Thrombus involving left subclavian vein September 2011.  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  5. Dyslipidemia.   Mrs. Hailey Hill states that she is doing great and feeling well.  The patient states that approximately 2 weeks ago while cleaning out a shed, she was exposed to black mold.  She went to Urgent Care and was prescribed 10 days of BID antibiotic therapy for a continuous cough and throat constriction (she cannot recall the name of the antibiotic). The patient will complete her current antibiotic therapy tomorrow. The patient had a negative mammogram in May, 2013, she received the influenza and pneumonia vaccines in November, 2012 and she is due for her next colonoscopy in 2014.  The patient stated during today's visit that she has concerns about her insurance coverage. The patient also expressed concerns about her laboratory values.  Dr. Arline Asp ordered a repeat of her labs and the values were normal.  The normal values were  printed and shared with the patient.  Past Medical History: Past Medical History  Diagnosis Date  . Lung cancer   . SVC syndrome   . Dyslipidemia   . Peripheral neuropathy   . Thrombus 10/21/2010    Left basilic vein,  Left subclavian vein    Surgical History: History reviewed. No pertinent past surgical history.  Current Medications: Current Outpatient Prescriptions  Medication Sig Dispense Refill  . aspirin 81 MG tablet Take 81 mg by mouth daily.        . rosuvastatin (CRESTOR) 10 MG tablet Take 10 mg by mouth daily.      . temazepam (RESTORIL) 30 MG capsule Take 1 capsule (30 mg total) by mouth at bedtime as needed for sleep.  30 capsule  4    Allergies: Allergies  Allergen Reactions  . Promethazine Hcl Other (See Comments)    Pt reports feeling shaky and "sick" after phenergan    Family History: Family History  Problem Relation Age of Onset  . Nephrolithiasis Son     Social History: History  Substance Use Topics  . Smoking status: Former Smoker    Quit date: 03/08/2010  . Smokeless tobacco: Not on file  . Alcohol Use:     Review of Systems: 10 Point review of systems was completed and is negative except as noted above.   Physical Exam:   Blood pressure 118/77, pulse 83, temperature 97 F (36.1 C), temperature source Oral, resp. rate 20, height 5\' 9"  (1.753 m), weight 170 lb 11.2 oz (77.429 kg).  General appearance: Alert, cooperative,well developed, well  nourishe and mild distress Head: Normocephalic, without obvious abnormality, atraumatic Eyes: Conjunctivae/corneas clear, PERRLA, EOMI Nose: Nares normal. Septum midline. Mucosa normal. No drainage or sinus tenderness. Neck: No adenopathy, supple, symmetrical, trachea midline, thyroid not enlarged, symmetric, no tenderness Resp: Clear to auscultation bilaterally Cardio: Regular rate and rhythm, S1, S2 normal, no murmur, click, rub or gallop GI: Soft, non-tender, non-distended, hypoactive bowel sounds,  no organomegaly Lymph nodes: Cervical, supraclavicular, and axillary nodes normal   Laboratory Data: Results for orders placed in visit on 06/08/12 (from the past 48 hour(s))  CBC WITH DIFFERENTIAL     Status: Abnormal   Collection Time   06/08/12  9:50 AM      Component Value Range Comment   WBC 5.3  3.9 - 10.3 10e3/uL    NEUT# 4.0  1.5 - 6.5 10e3/uL    HGB 13.7  11.6 - 15.9 g/dL    HCT 16.1  09.6 - 04.5 %    Platelets 244  145 - 400 10e3/uL    MCV 88.2  79.5 - 101.0 fL    MCH 31.0  25.1 - 34.0 pg    MCHC 35.1  31.5 - 36.0 g/dL    RBC 4.09  8.11 - 9.14 10e6/uL    RDW 13.5  11.2 - 14.5 %    lymph# 0.8 (*) 0.9 - 3.3 10e3/uL    MONO# 0.4  0.1 - 0.9 10e3/uL    Eosinophils Absolute 0.1  0.0 - 0.5 10e3/uL    Basophils Absolute 0.0  0.0 - 0.1 10e3/uL    NEUT% 75.6  38.4 - 76.8 %    LYMPH% 15.5  14.0 - 49.7 %    MONO% 7.0  0.0 - 14.0 %    EOS% 1.7  0.0 - 7.0 %    BASO% 0.2  0.0 - 2.0 %      Imaging Studies: 1. MRI of the head with and without IV contrast from 11/26/2010 showed no evidence for metastatic disease or any intracranial abnormality.  2. CT scan of the chest with contrast on 12/14/2010 showed marked response to therapy within the chest when compared with the PET  scan of 07/13/2010 and chest CT scan of 07/10/2010.  3. CT scan of the chest with contrast on 06/20/2011 showed some residual ill-defined soft tissue in the mediastinum felt to be unchanged from the CT scan of 12/14/2010. There were evolving changes of radiation therapy in the medial left hemithorax. There was a small anterior pericardial effusion.  4. CT scan of the chest with IV contrast on 01/24/2012 showed no evidence for metastatic disease within the chest. The previously  described AP window mediastinal soft tissue thickening has improved and has nearly resolved. The patient has some bilateral pulmonary  nodules which are stable. 5. Screening mammogram on 04/18/2012 did not identify any specific mammographic  evidence of malignancy.    Impression/Plan: Hailey Hill continues to do well, now at 2 years from the time of diagnosis. She is without evidence of recurrent disease. As stated, her last CT scan of the chest with IV contrast was on 01/24/2012. We will plan to see Yasheka again in 2 months at which time we will check CBC, chemistries and CEA. A repeat CT scan of the chest is scheduled for later this month.   Dreya Buhrman NP-C 06/08/2012, 10:37 AM

## 2012-06-08 NOTE — Patient Instructions (Signed)
Patient ID: Hailey Hill,   DOB: 03/25/1951,  MRN: [id number]   [facility] Discharge Instructions  RECOMMENDATIONS MAD BY THE CONSULTANT AND ANY TEST RESULT(S) WILL BE FORWARDED TO YOU REFERRING DOCTOR   EXAM FINDINGS BY NURSE PRACTITIONER TODAY TO REPORT TO THE CLINIC OR PRIMARY PROVIDER:    Your Current Medications Are: Current Outpatient Prescriptions  Medication Sig Dispense Refill  . aspirin 81 MG tablet Take 81 mg by mouth daily.        . rosuvastatin (CRESTOR) 10 MG tablet Take 10 mg by mouth daily.      . temazepam (RESTORIL) 30 MG capsule Take 1 capsule (30 mg total) by mouth at bedtime as needed for sleep.  30 capsule  4     INSTRUCTIONS GIVEN, DISCUSSED AND FOLLOW-UP:   I acknowledge that I have been informed and understand all the instructions given to me and have received a copy.  I do not have any further questions at this time, but I understand that I may call the [facility] at [phone] during business hours should I have any further questions or need assistance in obtaining follow-up care.   06/08/2012, 9:50 AM

## 2012-06-08 NOTE — Telephone Encounter (Signed)
appts made and printed for pt aom °

## 2012-06-29 ENCOUNTER — Telehealth: Payer: Self-pay

## 2012-06-29 NOTE — Telephone Encounter (Signed)
Atiya called asking [place] to fax labs to Dr Aram Beecham white. She stated Dr Cliffton Asters was interested in vit d level and liver funtions. Labs were faxed and I told pt that we did not have recent vit D nor lipid panel so Dr Cliffton Asters may be wanting to draw them.

## 2012-07-04 ENCOUNTER — Ambulatory Visit (HOSPITAL_COMMUNITY)
Admission: RE | Admit: 2012-07-04 | Discharge: 2012-07-04 | Disposition: A | Discharge status: Home / Self Care | Payer: BC Managed Care – PPO | Source: Ambulatory Visit | Attending: Oncology | Admitting: Oncology

## 2012-07-04 DIAGNOSIS — K7689 Other specified diseases of liver: Secondary | ICD-10-CM | POA: Insufficient documentation

## 2012-07-04 DIAGNOSIS — Z923 Personal history of irradiation: Secondary | ICD-10-CM | POA: Insufficient documentation

## 2012-07-04 DIAGNOSIS — R911 Solitary pulmonary nodule: Secondary | ICD-10-CM | POA: Insufficient documentation

## 2012-07-04 DIAGNOSIS — D739 Disease of spleen, unspecified: Secondary | ICD-10-CM | POA: Insufficient documentation

## 2012-07-04 DIAGNOSIS — C3490 Malignant neoplasm of unspecified part of unspecified bronchus or lung: Secondary | ICD-10-CM | POA: Insufficient documentation

## 2012-07-04 MED ORDER — IOHEXOL 300 MG/ML  SOLN
80.0000 mL | Freq: Once | INTRAMUSCULAR | Status: AC | PRN
  Administered 2012-07-04: 80 mL via INTRAVENOUS

## 2012-08-31 ENCOUNTER — Telehealth: Payer: Self-pay | Admitting: *Deleted

## 2012-08-31 NOTE — Telephone Encounter (Signed)
Patient confirmed over the phone the new date and time on 11-23-2012 starting at 1:30pm

## 2012-09-07 ENCOUNTER — Ambulatory Visit: Payer: BC Managed Care – PPO | Admitting: Oncology

## 2012-09-07 ENCOUNTER — Other Ambulatory Visit: Payer: BC Managed Care – PPO | Admitting: Lab

## 2012-11-14 ENCOUNTER — Telehealth: Payer: Self-pay | Admitting: Oncology

## 2012-11-14 NOTE — Telephone Encounter (Signed)
Pt  Called today to r/s 1/10 appt to after 2/10 due to new ins does not kick in until 2/1. No availability on 2/10 and pt needs AM. Pt was given new appt for 2/24 @ 9:30 am.

## 2012-11-16 ENCOUNTER — Other Ambulatory Visit: Payer: BC Managed Care – PPO | Admitting: Lab

## 2012-11-16 ENCOUNTER — Ambulatory Visit: Payer: BC Managed Care – PPO | Admitting: Oncology

## 2012-11-23 ENCOUNTER — Ambulatory Visit: Payer: BC Managed Care – PPO | Admitting: Oncology

## 2012-11-23 ENCOUNTER — Other Ambulatory Visit: Payer: BC Managed Care – PPO | Admitting: Lab

## 2012-11-27 ENCOUNTER — Telehealth: Payer: Self-pay | Admitting: Medical Oncology

## 2012-11-27 NOTE — Telephone Encounter (Signed)
Pt called to let us know that she is doing a biometric diet and exercise plan. She has learned about this to help the neuropathy in her feet. She is concerned about drinking too much sugar because she has heard sugar feeds cancer. Per Dr. Arline Asp he does not see any reason she can drink the protein drinks. He is not aware of the sugar causing or feeding cancer.

## 2012-12-31 ENCOUNTER — Ambulatory Visit: Payer: BC Managed Care – PPO | Admitting: Oncology

## 2012-12-31 ENCOUNTER — Other Ambulatory Visit: Payer: BC Managed Care – PPO | Admitting: Lab

## 2013-01-09 ENCOUNTER — Ambulatory Visit (HOSPITAL_BASED_OUTPATIENT_CLINIC_OR_DEPARTMENT_OTHER): Payer: Medicare Other | Admitting: Oncology

## 2013-01-09 ENCOUNTER — Telehealth: Payer: Self-pay | Admitting: Oncology

## 2013-01-09 ENCOUNTER — Encounter: Payer: Self-pay | Admitting: Oncology

## 2013-01-09 ENCOUNTER — Other Ambulatory Visit (HOSPITAL_BASED_OUTPATIENT_CLINIC_OR_DEPARTMENT_OTHER): Payer: Medicare Other | Admitting: Lab

## 2013-01-09 VITALS — BP 113/74 | HR 86 | Temp 96.9°F | Resp 18 | Ht 69.0 in | Wt 167.6 lb

## 2013-01-09 DIAGNOSIS — C3410 Malignant neoplasm of upper lobe, unspecified bronchus or lung: Secondary | ICD-10-CM

## 2013-01-09 DIAGNOSIS — R209 Unspecified disturbances of skin sensation: Secondary | ICD-10-CM

## 2013-01-09 DIAGNOSIS — Z87891 Personal history of nicotine dependence: Secondary | ICD-10-CM

## 2013-01-09 DIAGNOSIS — Z86718 Personal history of other venous thrombosis and embolism: Secondary | ICD-10-CM

## 2013-01-09 LAB — COMPREHENSIVE METABOLIC PANEL (CC13)
ALT: 17 U/L (ref 0–55)
Alkaline Phosphatase: 94 U/L (ref 40–150)
Glucose: 101 mg/dl — ABNORMAL HIGH (ref 70–99)
Sodium: 142 mEq/L (ref 136–145)
Total Bilirubin: 0.45 mg/dL (ref 0.20–1.20)
Total Protein: 7.1 g/dL (ref 6.4–8.3)

## 2013-01-09 LAB — CBC WITH DIFFERENTIAL/PLATELET
BASO%: 0.4 % (ref 0.0–2.0)
EOS%: 1.3 % (ref 0.0–7.0)
MCH: 31 pg (ref 25.1–34.0)
MCHC: 34.8 g/dL (ref 31.5–36.0)
RBC: 4.53 10*6/uL (ref 3.70–5.45)
RDW: 14.4 % (ref 11.2–14.5)
lymph#: 0.9 10*3/uL (ref 0.9–3.3)

## 2013-01-09 LAB — LACTATE DEHYDROGENASE (CC13): LDH: 172 U/L (ref 125–245)

## 2013-01-09 NOTE — Progress Notes (Signed)
CC:   Stacie Acres. Cliffton Asters, M.D. [name], M.D. [name], M.D.  PROBLEM LIST:  1. 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 Hailey Hill also had  thrombus in the left subclavian vein and was treated with Lovenox  for several months. She received 6 cycles of chemotherapy with  cisplatin and VP-16 in combination with Neulasta from 07/14/2010  through 11/02/2010. She received radiation treatments to the chest  from 10/04/2010 through 11/19/2010 and then prophylactic whole-  brain radiation 2500 cGy in 10 fractions from 12/07/2010 through  12/20/2010. CT scan of the chest carried out on 07/04/2012 showed no  evidence for recurrent lung cancer.  2. History of superior vena cava syndrome.  3. Thrombus involving left subclavian vein September 2011.  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  5. Dyslipidemia.   MEDICATIONS:  Reviewed and recorded. Current Outpatient Prescriptions  Medication Sig Dispense Refill  . aspirin 81 MG tablet Take 81 mg by mouth daily.        . rosuvastatin (CRESTOR) 10 MG tablet Take 10 mg by mouth daily.      . temazepam (RESTORIL) 30 MG capsule Take 1 capsule (30 mg total) by mouth at bedtime as needed for sleep.  30 capsule  4   No current facility-administered medications for this visit.    SMOKING HISTORY:  The patient was a former smoker.  She stopped smoking in May 2011.   HISTORY:  Hailey Hill was seen today for followup of her small cell carcinoma lung which presented with superior vena cava syndrome in late August 2011.  Hailey Hill was last seen by [place] on 06/08/2012 and prior to that on 03/27/2012.  Her condition remains unchanged and without any symptoms to suggest recurrent lung cancer, now 2-1/2 years from the time of diagnosis.  Hailey Hill's only problem is persistent numbness and some dysesthesia in her left foot.  She says she sometimes trips.  The  foot feels like it is asleep.  She has no other complaints today, feels quite well.  She has undergone treatment for plantar fasciitis involving that left foot.  Hailey Hill did have a CT scan of the chest with IV contrast on 07/04/2012.  That scan shows no evidence of disease.  PHYSICAL EXAM:  General:  Hailey Hill looks well and is in excellent spirits. Weight is 167 pounds 9.6 ounces, height 5 feet 9 inches, body surface area 1.92 sq m.  Vital Signs:  Blood pressure 113/74.  Other vital signs are normal.  O2 saturation on room air at rest was 98%. HEENT: There is no scleral icterus or facial swelling. No jugular venous distention. Mouth and pharynx are benign. Neck: Without adenopathy. Heart and lungs: Normal. Hailey Hill does not have a Port-A-Cath or central catheter. Breasts: Not examined. Hailey Hill  is due for mammograms. Abdomen: Benign with no organomegaly or masses  palpable. Extremities: No peripheral edema of the legs. There may be  a little puffiness of the ankles. Neurologic: Exam is normal.   LABORATORY DATA:  From today, white count 6.5, ANC 5.1, hemoglobin 14.0, hematocrit 40.4, platelets 215,000.  Chemistries today were essentially normal.  CEA is pending.  CEA from 06/08/2012 was less than 0.5.  IMAGING STUDIES:  1. MRI of the head with and without IV contrast from 11/26/2010 showed  no evidence for metastatic disease or any intracranial abnormality.  2. CT scan of the chest with contrast on 12/14/2010 showed marked  response  to therapy within the chest when compared with the PET  scan of 07/13/2010 and chest CT scan of 07/10/2010.  3. CT scan of the chest with contrast on 06/20/2011 showed some  residual ill-defined soft tissue in the mediastinum felt to be  unchanged from the CT scan of 12/14/2010. There were evolving  changes of radiation therapy in the medial left hemithorax. There  was a small anterior pericardial effusion.  4. CT scan of the chest with IV contrast on 01/24/2012  showed no  evidence for metastatic disease within the chest. The previously  described AP window mediastinal soft tissue thickening has improved  and has nearly resolved. The patient has some bilateral pulmonary  nodules which are stable. 5. Bilateral screening mammograms from 04/17/2012 were negative. 6. CT scan of the chest with IV contrast on 07/04/2012 compared with the prior CT scan of 01/24/2012 showed unchanged appearance of radiation- related findings in the left lung without progressive mass-like appearance to suggest recurrent malignancy.  There was ill-defined stranding along the trachea and AP window without measurable node, felt to be stable.  There is a 3-mm nodule in the right middle lobe which is stable over the past 2 years and considered benign.   IMPRESSION AND PLAN:  Hailey Hill continues to do well, now 2-1/2 years from the time of diagnosis in early September 2011.  The most recent CT scan of the chest with IV contrast was carried out on 07/04/2012, showed no evidence for recurrence.  We will plan to see Hailey Hill again in late June, somewhere around June 27th, at which time we will check CBC and chemistries.  We will also have Hailey Hill obtain a CT scan of the chest with IV contrast prior to that appointment, around June 23rd.  It should be mentioned that Hailey Hill was supposed to be seeing [place] every 2 months.  She apparently was without insurance until early February and for that reason had postponed her appointments from her last visit on 06/08/2012.  Hailey Hill now has health insurance due to the Affordable Care Act since she could not be denied medical insurance coverage because of a preexisting condition.    ______________________________ Samul Dada, M.D. DSM/MEDQ  D:  01/09/2013  T:  01/09/2013  Job:  [id number]

## 2013-01-09 NOTE — Progress Notes (Signed)
This office note has been dictated.  #[id number]

## 2013-01-09 NOTE — Telephone Encounter (Signed)
gv and printed appt schedule for pt for June...pt aware cs will contact with d/t of c

## 2013-01-10 ENCOUNTER — Other Ambulatory Visit: Payer: Self-pay | Admitting: Medical Oncology

## 2013-01-10 DIAGNOSIS — C3490 Malignant neoplasm of unspecified part of unspecified bronchus or lung: Secondary | ICD-10-CM

## 2013-01-10 MED ORDER — TEMAZEPAM 30 MG PO CAPS: 30.0000 mg | ORAL_CAPSULE | Freq: Every evening | ORAL | Status: DC | PRN

## 2013-03-22 ENCOUNTER — Telehealth: Payer: Self-pay | Admitting: *Deleted

## 2013-03-22 NOTE — Telephone Encounter (Signed)
sw pt informed her that DSM would like to see her that evening on 05/03/13. gv appt d/t for lab 2:30PM and ov @ 4pm. Pt is aware...td

## 2013-05-01 ENCOUNTER — Telehealth: Payer: Self-pay

## 2013-05-01 ENCOUNTER — Encounter (HOSPITAL_COMMUNITY): Payer: Self-pay

## 2013-05-01 ENCOUNTER — Ambulatory Visit (HOSPITAL_COMMUNITY)
Admission: RE | Admit: 2013-05-01 | Discharge: 2013-05-01 | Disposition: A | Discharge status: Home / Self Care | Payer: Medicare Other | Source: Ambulatory Visit | Attending: Oncology | Admitting: Oncology

## 2013-05-01 DIAGNOSIS — J479 Bronchiectasis, uncomplicated: Secondary | ICD-10-CM | POA: Insufficient documentation

## 2013-05-01 DIAGNOSIS — C3490 Malignant neoplasm of unspecified part of unspecified bronchus or lung: Secondary | ICD-10-CM | POA: Insufficient documentation

## 2013-05-01 DIAGNOSIS — J8410 Pulmonary fibrosis, unspecified: Secondary | ICD-10-CM | POA: Insufficient documentation

## 2013-05-01 MED ORDER — IOHEXOL 300 MG/ML  SOLN
80.0000 mL | Freq: Once | INTRAMUSCULAR | Status: AC | PRN
  Administered 2013-05-01: 80 mL via INTRAVENOUS

## 2013-05-01 NOTE — Progress Notes (Signed)
Quick Note:  Please notify patient and call/fax these results to patient's doctors. ______ 

## 2013-05-01 NOTE — Telephone Encounter (Signed)
Gave pt result of CT. She said thank you.

## 2013-05-03 ENCOUNTER — Other Ambulatory Visit: Payer: Medicare Other | Admitting: Lab

## 2013-05-03 ENCOUNTER — Ambulatory Visit: Payer: Medicare Other | Admitting: Oncology

## 2013-05-06 ENCOUNTER — Ambulatory Visit (HOSPITAL_BASED_OUTPATIENT_CLINIC_OR_DEPARTMENT_OTHER): Payer: Medicare Other | Admitting: Oncology

## 2013-05-06 ENCOUNTER — Encounter: Payer: Self-pay | Admitting: Oncology

## 2013-05-06 ENCOUNTER — Other Ambulatory Visit (HOSPITAL_BASED_OUTPATIENT_CLINIC_OR_DEPARTMENT_OTHER): Payer: Medicare Other | Admitting: Lab

## 2013-05-06 VITALS — BP 115/80 | HR 81 | Temp 97.0°F | Resp 18 | Ht 69.0 in | Wt 158.4 lb

## 2013-05-06 DIAGNOSIS — C3490 Malignant neoplasm of unspecified part of unspecified bronchus or lung: Secondary | ICD-10-CM

## 2013-05-06 LAB — CBC WITH DIFFERENTIAL/PLATELET
Eosinophils Absolute: 0.1 10*3/uL (ref 0.0–0.5)
LYMPH%: 12.8 % — ABNORMAL LOW (ref 14.0–49.7)
MCV: 88.1 fL (ref 79.5–101.0)
MONO%: 5.8 % (ref 0.0–14.0)
NEUT#: 4.5 10*3/uL (ref 1.5–6.5)
NEUT%: 79.5 % — ABNORMAL HIGH (ref 38.4–76.8)
Platelets: 207 10*3/uL (ref 145–400)
RBC: 4.25 10*6/uL (ref 3.70–5.45)

## 2013-05-06 LAB — COMPREHENSIVE METABOLIC PANEL (CC13)
BUN: 12.1 mg/dL (ref 7.0–26.0)
CO2: 28 mEq/L (ref 22–29)
Creatinine: 0.9 mg/dL (ref 0.6–1.1)
Glucose: 86 mg/dl (ref 70–140)
Total Bilirubin: 0.36 mg/dL (ref 0.20–1.20)

## 2013-05-06 LAB — LACTATE DEHYDROGENASE (CC13): LDH: 171 U/L (ref 125–245)

## 2013-05-06 NOTE — Progress Notes (Signed)
.  This office note has been dictated.  #[id number]

## 2013-05-07 ENCOUNTER — Telehealth: Payer: Self-pay | Admitting: Internal Medicine

## 2013-05-07 ENCOUNTER — Telehealth: Payer: Self-pay

## 2013-05-07 NOTE — Telephone Encounter (Signed)
Pt called asking about next visit. Dr Arline Asp put on POF for pt to see Dr Arbutus Ped. Pt stated she is not willing to see interim MD. Pt stated she had talked to Dr Arbutus Ped during his lung presentation that he would be picking up some of Dr Murinson's pts. This will be addressed with Jule Ser. Pt also stated she will schedule her mammogram with [facility].

## 2013-05-07 NOTE — Progress Notes (Signed)
CC:   Stacie Acres. Cliffton Asters, M.D. [name], M.D. [name], M.D.  PROBLEM LIST:  1. 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 Olene also had  thrombus in the left subclavian vein and was treated with Lovenox  for several months. She received 6 cycles of chemotherapy with  cisplatin and VP-16 in combination with Neulasta from 07/14/2010  through 11/02/2010. She received radiation treatments to the chest  from 10/04/2010 through 11/19/2010 and then prophylactic whole-  brain radiation 2500 cGy in 10 fractions from 12/07/2010 through  12/20/2010. CT scan of the chest carried out on 05/01/2013 showed no  evidence for recurrent lung cancer.  2. History of superior vena cava syndrome.  3. Thrombus involving left subclavian vein September 2011.  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  5. Dyslipidemia.    MEDICATIONS:  Reviewed and recorded. Current Outpatient Prescriptions  Medication Sig Dispense Refill  . aspirin 81 MG tablet Take 81 mg by mouth daily.        . rosuvastatin (CRESTOR) 10 MG tablet Take 10 mg by mouth daily.      . temazepam (RESTORIL) 30 MG capsule Take 1 capsule (30 mg total) by mouth at bedtime as needed for sleep.  30 capsule  2   No current facility-administered medications for this visit.     SMOKING HISTORY:  The patient smoked a half to 1 pack of cigarettes a day for almost 40 years.  She stopped smoking in May 2011.    HISTORY:  I saw Hailey Hill today for followup of her small-cell carcinoma of the lung presenting with superior vena cava syndrome in late August 2011.  Hailey Hill was last seen by [place] on 01/09/2013.  She underwent a CT scan of the chest with IV contrast on 05/01/2013, which was negative.  Her clinical condition remains essentially normal without any symptoms to suggest recurrent lung cancer.  She walks 3 to 3-1/2 miles per day 6 days a  week.  She continues to have some neuropathy involving her feet, which is slightly improved.  She is without specific complaints today.  PHYSICAL EXAMINATION:  Demesha looks well.  Weight is 158 pounds 6.4 ounces.  Height 5 feet 9 inches.  Body surface area 1.87 sq m.  Blood pressure 115/80.  Other vital signs are normal.  O2 saturation on room air at rest was 100%.  There is no scleral icterus.  Mouth and pharynx are benign.  There is no peripheral adenopathy palpable.  No jugular venous distention or facial puffiness.  Heart and lungs were normal. Coutney does not have a Port-A-Cath or central catheter.  Breasts were not examined.  Last mammograms were on 04/17/2012.  Adessa is due for mammograms.  We will remind her of this.  Abdomen:  Benign with no organomegaly or masses palpable.  Extremities:  No peripheral edema. Neurologic:  Normal.  No clubbing.   LABORATORY DATA:  Today, white count 5.6, ANC 4.5, hemoglobin 13.1, hematocrit 37.4, platelets 207,000.  Chemistries were normal.  Albumin 3.7.  LDH 171.  BUN and creatinine were normal.  CEA on 01/09/2013 was 0.6.   IMAGING STUDIES:  1. MRI of the head with and without IV contrast from 11/26/2010 showed  no evidence for metastatic disease or any intracranial abnormality.  2. CT scan of the chest with contrast on 12/14/2010 showed marked  response to therapy within the chest when compared with the PET  scan of 07/13/2010 and chest CT scan of 07/10/2010.  3. CT scan of the chest with contrast on 06/20/2011 showed some  residual ill-defined soft tissue in the mediastinum felt to be  unchanged from the CT scan of 12/14/2010. There were evolving  changes of radiation therapy in the medial left hemithorax. There  was a small anterior pericardial effusion.  4. CT scan of the chest with IV contrast on 01/24/2012 showed no  evidence for metastatic disease within the chest. The previously  described AP window mediastinal soft tissue  thickening has improved  and has nearly resolved. The patient has some bilateral pulmonary  nodules which are stable.  5. Bilateral screening mammograms from 04/17/2012 were negative.  6. CT scan of the chest with IV contrast on 07/04/2012 compared with the  prior CT scan of 01/24/2012 showed unchanged appearance of radiation-  related findings in the left lung without progressive mass-like  appearance to suggest recurrent malignancy. There was ill-defined  stranding along the trachea and AP window without measurable node, felt  to be stable. There is a 3-mm nodule in the right middle lobe which is  stable over the past 2 years and considered benign. 7. CT scan of the chest with IV contrast on 05/01/2013 showed no acute cardiopulmonary abnormalities.  There was an unchanged appearance of the external beam radiation within the left lung and no specific features to suggest residual or recurrent tumor.   IMPRESSION AND PLAN:  Ciani continues to do well, now approaching 3 years from the time of diagnosis in early September 2011.  [place] will be reminded about having mammograms.  We talked about possible future scanning.  I think she is getting to the point where she probably does not need more diagnostic scanning.  She may benefit from screening CT scans in the future given her previous smoking history and ongoing risk for the development of new smoking-related lung cancers.  At this point, her risk for recurrence from her known small-cell lung cancer is relatively low.    ______________________________ Samul Dada, M.D. DSM/MEDQ  D:  05/06/2013  T:  05/07/2013  Job:  [id number]

## 2013-06-26 ENCOUNTER — Other Ambulatory Visit: Payer: Self-pay

## 2013-06-26 DIAGNOSIS — Z1231 Encounter for screening mammogram for malignant neoplasm of breast: Secondary | ICD-10-CM

## 2013-07-17 ENCOUNTER — Ambulatory Visit
Admission: RE | Admit: 2013-07-17 | Discharge: 2013-07-17 | Disposition: A | Discharge status: Home / Self Care | Payer: Medicare Other | Source: Ambulatory Visit

## 2013-07-17 DIAGNOSIS — Z1231 Encounter for screening mammogram for malignant neoplasm of breast: Secondary | ICD-10-CM

## 2013-09-12 ENCOUNTER — Encounter: Payer: Self-pay | Admitting: Oncology

## 2013-09-12 NOTE — Progress Notes (Signed)
Hailey Hill from [place] has left a message for the patient. I called and spoke with Hailey Hill and she said they are in the process of refiling some claims for her. She was going to cancel some appts and they told her not to do that and the claims will be refiled. She didn't want anything not to be covered. [employer] just wanted to let us know

## 2013-09-13 ENCOUNTER — Other Ambulatory Visit: Payer: Self-pay | Admitting: *Deleted

## 2013-09-13 DIAGNOSIS — C3490 Malignant neoplasm of unspecified part of unspecified bronchus or lung: Secondary | ICD-10-CM

## 2013-09-16 ENCOUNTER — Encounter: Payer: Self-pay | Admitting: Internal Medicine

## 2013-09-16 ENCOUNTER — Telehealth: Payer: Self-pay | Admitting: Internal Medicine

## 2013-09-16 ENCOUNTER — Encounter (INDEPENDENT_AMBULATORY_CARE_PROVIDER_SITE_OTHER): Payer: Self-pay

## 2013-09-16 ENCOUNTER — Ambulatory Visit (HOSPITAL_BASED_OUTPATIENT_CLINIC_OR_DEPARTMENT_OTHER): Payer: Medicare Other | Admitting: Internal Medicine

## 2013-09-16 ENCOUNTER — Other Ambulatory Visit (HOSPITAL_BASED_OUTPATIENT_CLINIC_OR_DEPARTMENT_OTHER): Payer: Medicare Other | Admitting: Lab

## 2013-09-16 VITALS — BP 114/78 | HR 82 | Temp 97.9°F | Resp 20 | Ht 69.0 in | Wt 155.1 lb

## 2013-09-16 DIAGNOSIS — C3490 Malignant neoplasm of unspecified part of unspecified bronchus or lung: Secondary | ICD-10-CM

## 2013-09-16 DIAGNOSIS — C3492 Malignant neoplasm of unspecified part of left bronchus or lung: Secondary | ICD-10-CM

## 2013-09-16 LAB — COMPREHENSIVE METABOLIC PANEL (CC13)
Anion Gap: 11 mEq/L (ref 3–11)
CO2: 25 mEq/L (ref 22–29)
Calcium: 10 mg/dL (ref 8.4–10.4)
Chloride: 104 mEq/L (ref 98–109)
Creatinine: 1 mg/dL (ref 0.6–1.1)
Glucose: 87 mg/dl (ref 70–140)
Total Bilirubin: 0.44 mg/dL (ref 0.20–1.20)
Total Protein: 6.9 g/dL (ref 6.4–8.3)

## 2013-09-16 LAB — CBC WITH DIFFERENTIAL/PLATELET
Eosinophils Absolute: 0.1 10*3/uL (ref 0.0–0.5)
HCT: 41.4 % (ref 34.8–46.6)
HGB: 14.1 g/dL (ref 11.6–15.9)
LYMPH%: 15.7 % (ref 14.0–49.7)
MONO#: 0.3 10*3/uL (ref 0.1–0.9)
NEUT#: 4.7 10*3/uL (ref 1.5–6.5)
NEUT%: 77.1 % — ABNORMAL HIGH (ref 38.4–76.8)
Platelets: 220 10*3/uL (ref 145–400)
WBC: 6.1 10*3/uL (ref 3.9–10.3)

## 2013-09-16 LAB — LACTATE DEHYDROGENASE (CC13): LDH: 153 U/L (ref 125–245)

## 2013-09-16 NOTE — Patient Instructions (Signed)
Followup visit in 8 months with repeat CT scan of the chest.

## 2013-09-16 NOTE — Telephone Encounter (Signed)
gv and printed appt sched and avs for pt for June 2015  °

## 2013-09-16 NOTE — Progress Notes (Signed)
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    PRIOR THERAPY: 6 cycles of chemotherapy with cisplatin and VP-16 in combination with Neulasta from 07/14/2010  through 11/02/2010. She received radiation treatments to the chest from 10/04/2010 through 11/19/2010 and then prophylactic whole-  brain radiation 2500 cGy in 10 fractions from 12/07/2010 through 12/20/2010.  CURRENT THERAPY: Observation.  INTERVAL HISTORY: Hailey Hill 62 y.o. female returns to the clinic today for followup visit. She is a former patient of Dr. Arline Asp is here today to establish care with me after Dr. Arline Asp left the practice. 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 The patient was treated with 6 cycles of systemic chemotherapy with cisplatin and etoposide concurrent with radiation followed by prophylactic irradiation and has been observation since February of 2012 with no evidence for disease recurrence. Her last CT scan of the chest was performed in June of 2014. Patient has been doing well with no specific complaints. She denied having any significant chest pain, shortness breath, cough or hemoptysis. She denied having any weight loss or night sweats. She has no nausea or vomiting. She continues to have persistent peripheral neuropathy.  MEDICAL HISTORY: Past Medical History  Diagnosis Date  . SVC syndrome   . Dyslipidemia   . Peripheral neuropathy   . Thrombus 10/21/2010    Left basilic vein,  Left subclavian vein  . Lung cancer dx'd 07/2010    ALLERGIES:  is  allergic to promethazine hcl.  MEDICATIONS:  Current Outpatient Prescriptions  Medication Sig Dispense Refill  . aspirin 81 MG tablet Take 81 mg by mouth daily.        . rosuvastatin (CRESTOR) 10 MG tablet Take 10 mg by mouth daily.      . temazepam (RESTORIL) 30 MG capsule Take 1 capsule (30 mg total) by mouth at bedtime as needed for sleep.  30 capsule  2   No current facility-administered medications for this visit.    REVIEW OF SYSTEMS:  Constitutional: negative Eyes: negative Ears, nose, mouth, throat, and face: negative Respiratory: negative Cardiovascular: negative Gastrointestinal: negative Genitourinary:negative Integument/breast: negative Hematologic/lymphatic: negative Musculoskeletal:negative Neurological: negative Behavioral/Psych: negative Endocrine: negative Allergic/Immunologic: negative   PHYSICAL EXAMINATION: General appearance: alert, cooperative and no distress Head: Normocephalic, without obvious abnormality, atraumatic Neck: no adenopathy, no JVD, supple, symmetrical, trachea midline and thyroid not enlarged, symmetric, no tenderness/mass/nodules Lymph nodes: Cervical, supraclavicular, and axillary nodes normal. Resp: clear to auscultation bilaterally Back: symmetric, no curvature. ROM normal. No CVA tenderness. Cardio: regular rate and rhythm, S1, S2 normal, no murmur, click, rub or gallop GI: soft, non-tender; bowel sounds normal; no masses,  no organomegaly Extremities: extremities normal, atraumatic, no cyanosis or edema Neurologic: Alert and oriented X 3, normal strength and tone. Normal symmetric reflexes. Normal coordination and gait  ECOG PERFORMANCE STATUS: 0 - Asymptomatic  Blood pressure 114/78, pulse 82, temperature 97.9 F (36.6 C), temperature source Oral, resp. rate 20, height 5\' 9"  (1.753 m), weight 155 lb 1.6 oz (70.353 kg), SpO2 100.00%.  LABORATORY DATA: Lab Results  Component Value Date  WBC 6.1 09/16/2013   HGB 14.1 09/16/2013    HCT 41.4 09/16/2013   MCV 90.6 09/16/2013   PLT 220 09/16/2013      Chemistry      Component Value Date/Time   NA 142 05/06/2013 1414   NA 141 06/08/2012 0848   K 4.3 05/06/2013 1414   K 4.5 06/08/2012 0848   CL 108* 01/09/2013 0913   CL 104 06/08/2012 0848   CO2 28 05/06/2013 1414   CO2 27 06/08/2012 0848   BUN 12.1 05/06/2013 1414   BUN 13 06/08/2012 0848   CREATININE 0.9 05/06/2013 1414   CREATININE 0.96 06/08/2012 0848      Component Value Date/Time   CALCIUM 9.6 05/06/2013 1414   CALCIUM 9.8 06/08/2012 0848   ALKPHOS 84 05/06/2013 1414   ALKPHOS 86 06/08/2012 0848   AST 15 05/06/2013 1414   AST 17 06/08/2012 0848   ALT 14 05/06/2013 1414   ALT 14 06/08/2012 0848   BILITOT 0.36 05/06/2013 1414   BILITOT 0.5 06/08/2012 0848       RADIOGRAPHIC STUDIES: No results found.  ASSESSMENT AND PLAN: This is a very pleasant 62 years old white female with limited stage small cell lung cancer diagnosed in September of 2011, status post systemic chemotherapy with cisplatin and etoposide for 6 cycles concurrent with radiation followed by prophylactic irradiation. The patient has been doing fine since her treatment with no evidence for disease recurrence. Her last scan in June of 2014 showed no evidence for disease recurrence.  I reviewed her history in details and recommended for the patient to continue on observation for now. I would see her back for followup visit in June of 2015 with repeat CT scan of the chest for evaluation of her disease. She was advised to call immediately if she has any concerning symptoms in the interval. The patient voices understanding of current disease status and treatment options and is in agreement with the current care plan.  All questions were answered. The patient knows to call the clinic with any problems, questions or concerns. We can certainly see the patient much sooner if necessary.  I spent 20 minutes counseling the patient face to face. The total time spent in the  appointment was 30 minutes.

## 2014-04-22 ENCOUNTER — Other Ambulatory Visit (HOSPITAL_BASED_OUTPATIENT_CLINIC_OR_DEPARTMENT_OTHER): Payer: Medicare Other

## 2014-04-22 ENCOUNTER — Encounter (HOSPITAL_COMMUNITY): Payer: Self-pay

## 2014-04-22 ENCOUNTER — Ambulatory Visit (HOSPITAL_COMMUNITY)
Admission: RE | Admit: 2014-04-22 | Discharge: 2014-04-22 | Disposition: A | Discharge status: Home / Self Care | Payer: Medicare Other | Source: Ambulatory Visit | Attending: Internal Medicine | Admitting: Internal Medicine

## 2014-04-22 DIAGNOSIS — Z859 Personal history of malignant neoplasm, unspecified: Secondary | ICD-10-CM | POA: Insufficient documentation

## 2014-04-22 DIAGNOSIS — C3410 Malignant neoplasm of upper lobe, unspecified bronchus or lung: Secondary | ICD-10-CM

## 2014-04-22 DIAGNOSIS — C3492 Malignant neoplasm of unspecified part of left bronchus or lung: Secondary | ICD-10-CM

## 2014-04-22 LAB — CBC WITH DIFFERENTIAL/PLATELET
BASO%: 1.1 % (ref 0.0–2.0)
BASOS ABS: 0 10*3/uL (ref 0.0–0.1)
EOS%: 1.2 % (ref 0.0–7.0)
Eosinophils Absolute: 0 10*3/uL (ref 0.0–0.5)
HEMATOCRIT: 39.1 % (ref 34.8–46.6)
HGB: 13.3 g/dL (ref 11.6–15.9)
LYMPH#: 0.9 10*3/uL (ref 0.9–3.3)
LYMPH%: 20.2 % (ref 14.0–49.7)
MCH: 31 pg (ref 25.1–34.0)
MCHC: 34.1 g/dL (ref 31.5–36.0)
MCV: 90.9 fL (ref 79.5–101.0)
MONO#: 0.2 10*3/uL (ref 0.1–0.9)
MONO%: 5.8 % (ref 0.0–14.0)
NEUT%: 71.7 % (ref 38.4–76.8)
NEUTROS ABS: 3 10*3/uL (ref 1.5–6.5)
Platelets: 212 10*3/uL (ref 145–400)
RBC: 4.3 10*6/uL (ref 3.70–5.45)
RDW: 13.8 % (ref 11.2–14.5)
WBC: 4.2 10*3/uL (ref 3.9–10.3)

## 2014-04-22 LAB — COMPREHENSIVE METABOLIC PANEL (CC13)
ALT: 12 U/L (ref 0–55)
ANION GAP: 8 meq/L (ref 3–11)
AST: 13 U/L (ref 5–34)
Albumin: 3.8 g/dL (ref 3.5–5.0)
Alkaline Phosphatase: 80 U/L (ref 40–150)
BUN: 15.6 mg/dL (ref 7.0–26.0)
CALCIUM: 9.5 mg/dL (ref 8.4–10.4)
CHLORIDE: 108 meq/L (ref 98–109)
CO2: 27 meq/L (ref 22–29)
CREATININE: 1 mg/dL (ref 0.6–1.1)
Glucose: 101 mg/dl (ref 70–140)
Potassium: 4.5 mEq/L (ref 3.5–5.1)
Sodium: 143 mEq/L (ref 136–145)
Total Bilirubin: 0.46 mg/dL (ref 0.20–1.20)
Total Protein: 6.5 g/dL (ref 6.4–8.3)

## 2014-04-22 MED ORDER — IOHEXOL 300 MG/ML  SOLN
80.0000 mL | Freq: Once | INTRAMUSCULAR | Status: AC | PRN
  Administered 2014-04-22: 80 mL via INTRAVENOUS

## 2014-04-24 ENCOUNTER — Telehealth: Payer: Self-pay | Admitting: Internal Medicine

## 2014-04-24 ENCOUNTER — Encounter: Payer: Self-pay | Admitting: Internal Medicine

## 2014-04-24 ENCOUNTER — Ambulatory Visit (HOSPITAL_BASED_OUTPATIENT_CLINIC_OR_DEPARTMENT_OTHER): Payer: Medicare Other | Admitting: Internal Medicine

## 2014-04-24 VITALS — BP 126/75 | HR 78 | Temp 97.1°F | Resp 18 | Ht 69.0 in | Wt 159.1 lb

## 2014-04-24 DIAGNOSIS — G609 Hereditary and idiopathic neuropathy, unspecified: Secondary | ICD-10-CM

## 2014-04-24 DIAGNOSIS — Z86718 Personal history of other venous thrombosis and embolism: Secondary | ICD-10-CM

## 2014-04-24 DIAGNOSIS — C3490 Malignant neoplasm of unspecified part of unspecified bronchus or lung: Secondary | ICD-10-CM

## 2014-04-24 NOTE — Telephone Encounter (Signed)
gv and printed appt sched and avs for pt for Dec..Marland KitchenMarland Kitchen

## 2014-04-24 NOTE — Progress Notes (Signed)
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    PRIOR THERAPY:  1) 6 cycles of chemotherapy with cisplatin and VP-16 in combination with Neulasta from 07/14/2010  through 11/02/2010. She received radiation treatments to the chest from 10/04/2010 through 11/19/2010. 2) prophylactic whole- brain radiation 2500 cGy in 10 fractions from 12/07/2010 through 12/20/2010.  CURRENT THERAPY: Observation.  ADVANCE DIRECTIVES: The patient does not have metastatic and is not interested in getting information at this point.  INTERVAL HISTORY: Hailey Hill 63 y.o. female returns to the clinic today for six-months followup visit. She has no complaints today. She denied having any significant chest pain, shortness of breath, cough or hemoptysis. She denied having any weight loss or night sweats. She has no nausea or vomiting. She continues to have persistent peripheral neuropathy. She had repeat CT scan of the chest performed recently and she is here for evaluation and discussion of her scan results.  MEDICAL HISTORY: Past Medical History  Diagnosis Date  . SVC syndrome   . Dyslipidemia   . Peripheral neuropathy   . Thrombus 00/17/4944    Left basilic vein,  Left subclavian vein  . Lung cancer dx'd 07/2010    ALLERGIES:  is allergic to promethazine hcl.  MEDICATIONS:  Current Outpatient Prescriptions  Medication Sig Dispense Refill  . aspirin 81 MG tablet Take 81 mg by mouth daily.        . rosuvastatin (CRESTOR) 10 MG tablet Take 10 mg by mouth daily.       No current facility-administered medications for this visit.     REVIEW OF SYSTEMS:  Constitutional: negative Eyes: negative Ears, nose, mouth, throat, and face: negative Respiratory: negative Cardiovascular: negative Gastrointestinal: negative Genitourinary:negative Integument/breast: negative Hematologic/lymphatic: negative Musculoskeletal:negative Neurological: negative Behavioral/Psych: negative Endocrine: negative Allergic/Immunologic: negative   PHYSICAL EXAMINATION: General appearance: alert, cooperative and no distress Head: Normocephalic, without obvious abnormality, atraumatic Neck: no adenopathy, no JVD, supple, symmetrical, trachea midline and thyroid not enlarged, symmetric, no tenderness/mass/nodules Lymph nodes: Cervical, supraclavicular, and axillary nodes normal. Resp: clear to auscultation bilaterally Back: symmetric, no curvature. ROM normal. No CVA tenderness. Cardio: regular rate and rhythm, S1, S2 normal, no murmur, click, rub or gallop GI: soft, non-tender; bowel sounds normal; no masses,  no organomegaly Extremities: extremities normal, atraumatic, no cyanosis or edema Neurologic: Alert and oriented X 3, normal strength and tone. Normal symmetric reflexes. Normal coordination and gait  ECOG PERFORMANCE STATUS: 0 - Asymptomatic  Blood pressure 126/75, pulse 78, temperature 97.1 F (36.2 C), temperature source Oral, resp. rate 18, height 5\' 9"  (1.753 m), weight 159 lb 1.6 oz (72.167 kg), SpO2 99.00%.  LABORATORY DATA: Lab Results  Component Value Date   WBC 4.2 04/22/2014   HGB 13.3 04/22/2014   HCT 39.1 04/22/2014   MCV 90.9 04/22/2014   PLT 212 04/22/2014      Chemistry      Component Value Date/Time   NA 143 04/22/2014 0819   NA 141 06/08/2012 0848   K 4.5 04/22/2014 0819   K 4.5 06/08/2012 0848   CL 108* 01/09/2013 0913   CL 104 06/08/2012 0848   CO2  27 04/22/2014 0819   CO2 27 06/08/2012 0848   BUN 15.6 04/22/2014 0819   BUN 13 06/08/2012 0848   CREATININE 1.0 04/22/2014 0819   CREATININE 0.96 06/08/2012 0848        Component Value Date/Time   CALCIUM 9.5 04/22/2014 0819   CALCIUM 9.8 06/08/2012 0848   ALKPHOS 80 04/22/2014 0819   ALKPHOS 86 06/08/2012 0848   AST 13 04/22/2014 0819   AST 17 06/08/2012 0848   ALT 12 04/22/2014 0819   ALT 14 06/08/2012 0848   BILITOT 0.46 04/22/2014 0819   BILITOT 0.5 06/08/2012 0848       RADIOGRAPHIC STUDIES: Ct Chest W Contrast  04/22/2014   CLINICAL DATA:  Left small cell lung cancer diagnosed 2011, chemotherapy and XRT complete  EXAM: CT CHEST WITH CONTRAST  TECHNIQUE: Multidetector CT imaging of the chest was performed during intravenous contrast administration.  CONTRAST:  33mL OMNIPAQUE IOHEXOL 300 MG/ML  SOLN  COMPARISON:  05/01/2013  FINDINGS: Radiation changes in the left paramediastinal region.  No new/suspicious pulmonary nodules. Mild emphysematous changes. No pleural effusion or pneumothorax.  Visualized thyroid is unremarkable.  The heart is normal in size.  No pericardial effusion  No suspicious mediastinal, hilar, or axillary lymphadenopathy.  Visualized upper abdomen is notable for 9 mm cyst in the medial spleen (series 2/image 53).  Mild degenerative changes of the visualized thoracolumbar spine.  IMPRESSION: Radiation changes in the left paramediastinal region.  No evidence of recurrent or metastatic disease in the chest.   Electronically Signed   By: Julian Hy M.D.   On: 04/22/2014 12:28   ASSESSMENT AND PLAN: This is a very pleasant 63 years old white female with limited stage small cell lung cancer diagnosed in September of 2011, status post systemic chemotherapy with cisplatin and etoposide for 6 cycles concurrent with radiation followed by prophylactic irradiation. The patient has been doing fine since her treatment with no evidence for disease recurrence.  Her recent CT scan of the chest showed no evidence for disease recurrence. I discussed the scan results with the patient today and recommended for her to continue on observation with repeat CT scan of  the chest in 6 months. She was advised to call immediately if she has any concerning symptoms in the interval. The patient voices understanding of current disease status and treatment options and is in agreement with the current care plan.  All questions were answered. The patient knows to call the clinic with any problems, questions or concerns. We can certainly see the patient much sooner if necessary.  Disclaimer: This note was dictated with voice recognition software. Similar sounding words can inadvertently be transcribed and may not be corrected upon review.

## 2014-10-16 ENCOUNTER — Encounter (HOSPITAL_COMMUNITY): Payer: Self-pay

## 2014-10-16 ENCOUNTER — Ambulatory Visit (HOSPITAL_COMMUNITY)
Admission: RE | Admit: 2014-10-16 | Discharge: 2014-10-16 | Disposition: A | Discharge status: Home / Self Care | Payer: Medicare Other | Source: Ambulatory Visit | Attending: Internal Medicine | Admitting: Internal Medicine

## 2014-10-16 ENCOUNTER — Other Ambulatory Visit (HOSPITAL_BASED_OUTPATIENT_CLINIC_OR_DEPARTMENT_OTHER): Payer: Medicare Other

## 2014-10-16 DIAGNOSIS — C3432 Malignant neoplasm of lower lobe, left bronchus or lung: Secondary | ICD-10-CM | POA: Diagnosis not present

## 2014-10-16 DIAGNOSIS — C3490 Malignant neoplasm of unspecified part of unspecified bronchus or lung: Secondary | ICD-10-CM | POA: Diagnosis present

## 2014-10-16 LAB — CBC WITH DIFFERENTIAL/PLATELET
BASO%: 0.4 % (ref 0.0–2.0)
Basophils Absolute: 0 10*3/uL (ref 0.0–0.1)
EOS ABS: 0.1 10*3/uL (ref 0.0–0.5)
EOS%: 2 % (ref 0.0–7.0)
HEMATOCRIT: 40.7 % (ref 34.8–46.6)
HGB: 13.7 g/dL (ref 11.6–15.9)
LYMPH#: 1.1 10*3/uL (ref 0.9–3.3)
LYMPH%: 21.8 % (ref 14.0–49.7)
MCH: 30.4 pg (ref 25.1–34.0)
MCHC: 33.7 g/dL (ref 31.5–36.0)
MCV: 90.4 fL (ref 79.5–101.0)
MONO#: 0.3 10*3/uL (ref 0.1–0.9)
MONO%: 5.1 % (ref 0.0–14.0)
NEUT%: 70.7 % (ref 38.4–76.8)
NEUTROS ABS: 3.6 10*3/uL (ref 1.5–6.5)
Platelets: 235 10*3/uL (ref 145–400)
RBC: 4.5 10*6/uL (ref 3.70–5.45)
RDW: 13.2 % (ref 11.2–14.5)
WBC: 5.1 10*3/uL (ref 3.9–10.3)

## 2014-10-16 LAB — COMPREHENSIVE METABOLIC PANEL (CC13)
ALBUMIN: 3.7 g/dL (ref 3.5–5.0)
ALT: 17 U/L (ref 0–55)
ANION GAP: 10 meq/L (ref 3–11)
AST: 14 U/L (ref 5–34)
Alkaline Phosphatase: 84 U/L (ref 40–150)
BUN: 11.2 mg/dL (ref 7.0–26.0)
CALCIUM: 9.7 mg/dL (ref 8.4–10.4)
CHLORIDE: 106 meq/L (ref 98–109)
CO2: 29 meq/L (ref 22–29)
CREATININE: 0.9 mg/dL (ref 0.6–1.1)
EGFR: 72 mL/min/{1.73_m2} — AB (ref >90)
GLUCOSE: 95 mg/dL (ref 70–140)
POTASSIUM: 4.6 meq/L (ref 3.5–5.1)
Sodium: 144 mEq/L (ref 136–145)
Total Bilirubin: 0.22 mg/dL (ref 0.20–1.20)
Total Protein: 6.5 g/dL (ref 6.4–8.3)

## 2014-10-16 MED ORDER — IOHEXOL 300 MG/ML  SOLN
80.0000 mL | Freq: Once | INTRAMUSCULAR | Status: AC | PRN
  Administered 2014-10-16: 80 mL via INTRAVENOUS

## 2014-10-22 ENCOUNTER — Telehealth: Payer: Self-pay | Admitting: Internal Medicine

## 2014-10-22 ENCOUNTER — Ambulatory Visit (HOSPITAL_BASED_OUTPATIENT_CLINIC_OR_DEPARTMENT_OTHER): Payer: Medicare Other | Admitting: Internal Medicine

## 2014-10-22 ENCOUNTER — Encounter: Payer: Self-pay | Admitting: Internal Medicine

## 2014-10-22 VITALS — BP 120/69 | HR 82 | Temp 98.2°F | Resp 18 | Ht 69.0 in | Wt 165.3 lb

## 2014-10-22 DIAGNOSIS — C3492 Malignant neoplasm of unspecified part of left bronchus or lung: Secondary | ICD-10-CM

## 2014-10-22 DIAGNOSIS — Z85118 Personal history of other malignant neoplasm of bronchus and lung: Secondary | ICD-10-CM

## 2014-10-22 NOTE — Progress Notes (Signed)
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    PRIOR THERAPY:  1) 6 cycles of chemotherapy with cisplatin and VP-16 in combination with Neulasta from 07/14/2010  through 11/02/2010. She received radiation treatments to the chest from 10/04/2010 through 11/19/2010. 2) prophylactic whole- brain radiation 2500 cGy in 10 fractions from 12/07/2010 through 12/20/2010.  CURRENT THERAPY: Observation.  ADVANCE DIRECTIVES: The patient does not have metastatic and is not interested in getting information at this point.  INTERVAL HISTORY: Hailey Hill 63 y.o. female returns to the clinic today for six-months followup visit. The patient is feeling fine today with no specific complaints. She denied having any significant chest pain, shortness of breath, cough or hemoptysis. She denied having any weight loss or night sweats. She has no nausea or vomiting. She continues to have persistent peripheral neuropathy. She had repeat CT scan of the chest performed recently and she is here for evaluation and discussion of her scan results.  MEDICAL HISTORY: Past Medical History  Diagnosis Date  . SVC syndrome   . Dyslipidemia   . Peripheral neuropathy   . Thrombus 71/24/5809    Left basilic vein,  Left subclavian vein  . Lung cancer dx'd 07/2010    ALLERGIES:  is allergic to promethazine hcl.  MEDICATIONS:  Current Outpatient Prescriptions  Medication Sig Dispense Refill  . aspirin 81 MG tablet Take 81 mg by mouth daily.      . rosuvastatin (CRESTOR) 10 MG tablet Take 10 mg by mouth daily.     No current facility-administered  medications for this visit.    REVIEW OF SYSTEMS:  Constitutional: negative Eyes: negative Ears, nose, mouth, throat, and face: negative Respiratory: negative Cardiovascular: negative Gastrointestinal: negative Genitourinary:negative Integument/breast: negative Hematologic/lymphatic: negative Musculoskeletal:negative Neurological: negative Behavioral/Psych: negative Endocrine: negative Allergic/Immunologic: negative   PHYSICAL EXAMINATION: General appearance: alert, cooperative and no distress Head: Normocephalic, without obvious abnormality, atraumatic Neck: no adenopathy, no JVD, supple, symmetrical, trachea midline and thyroid not enlarged, symmetric, no tenderness/mass/nodules Lymph nodes: Cervical, supraclavicular, and axillary nodes normal. Resp: clear to auscultation bilaterally Back: symmetric, no curvature. ROM normal. No CVA tenderness. Cardio: regular rate and rhythm, S1, S2 normal, no murmur, click, rub or gallop GI: soft, non-tender; bowel sounds normal; no masses,  no organomegaly Extremities: extremities normal, atraumatic, no cyanosis or edema Neurologic: Alert and oriented X 3, normal strength and tone. Normal symmetric reflexes. Normal coordination and gait  ECOG PERFORMANCE STATUS: 0 - Asymptomatic  Blood pressure 120/69, pulse 82, temperature 98.2 F (36.8 C), temperature source Oral, resp. rate 18, height 5\' 9"  (1.753 m), weight 165 lb 4.8 oz (74.98 kg), SpO2 99 %.  LABORATORY DATA: Lab Results  Component Value Date   WBC 5.1 10/16/2014   HGB 13.7 10/16/2014   HCT 40.7 10/16/2014   MCV 90.4 10/16/2014   PLT 235 10/16/2014      Chemistry      Component Value Date/Time   NA 144 10/16/2014 0840   NA 141 06/08/2012 0848   K 4.6 10/16/2014 0840   K 4.5 06/08/2012 0848   CL 108* 01/09/2013 0913   CL 104 06/08/2012 0848  CO2 29 10/16/2014 0840   CO2 27 06/08/2012 0848   BUN 11.2 10/16/2014 0840   BUN 13 06/08/2012 0848   CREATININE 0.9  10/16/2014 0840   CREATININE 0.96 06/08/2012 0848      Component Value Date/Time   CALCIUM 9.7 10/16/2014 0840   CALCIUM 9.8 06/08/2012 0848   ALKPHOS 84 10/16/2014 0840   ALKPHOS 86 06/08/2012 0848   AST 14 10/16/2014 0840   AST 17 06/08/2012 0848   ALT 17 10/16/2014 0840   ALT 14 06/08/2012 0848   BILITOT 0.22 10/16/2014 0840   BILITOT 0.5 06/08/2012 0848       RADIOGRAPHIC STUDIES: Ct Chest W Contrast  10/16/2014   CLINICAL DATA:  Lung cancer restaging  EXAM: CT CHEST WITH CONTRAST  TECHNIQUE: Multidetector CT imaging of the chest was performed during intravenous contrast administration.  CONTRAST:  21mL OMNIPAQUE IOHEXOL 300 MG/ML  SOLN  COMPARISON:  04/22/2014  FINDINGS: Mediastinum: The heart size is normal. No pericardial effusion. No enlarged mediastinal or hilar lymph nodes identified. The trachea appears patent and is midline. Normal appearance of the esophagus.  Lungs/Pleura: No pleural effusion identified. Again noted is paramediastinal radiation change in the left lung. 5 mm subpleural nodule is identified within the left lower lobe, image 41/series 5. Stable from previous exam. No change in 2-3 mm right middle lobe nodule, image 36/series 5.  Upper Abdomen: Calcified granuloma noted within the left hepatic lobe. Stable cyst within the liver and spleen.  Musculoskeletal: No aggressive lytic or sclerotic bone lesions identified.  IMPRESSION: 1. No acute findings. 2. Stable radiation changes in the left lung without evidence for recurrent or metastatic disease in the chest.   Electronically Signed   By: Kerby Moors M.D.   On: 10/16/2014 12:23    ASSESSMENT AND PLAN: This is a very pleasant 63 years old white female with limited stage small cell lung cancer diagnosed in September of 2011, status post systemic chemotherapy with cisplatin and etoposide for 6 cycles concurrent with radiation followed by prophylactic irradiation. The patient has no complaints today. The recent CT  scan of the chest showed no evidence for disease recurrence. I discussed the scan results with the patient today and recommended for her to continue on observation with repeat CT scan of the chest in 6 months. She was advised to call immediately if she has any concerning symptoms in the interval. The patient voices understanding of current disease status and treatment options and is in agreement with the current care plan.  All questions were answered. The patient knows to call the clinic with any problems, questions or concerns. We can certainly see the patient much sooner if necessary.  Disclaimer: This note was dictated with voice recognition software. Similar sounding words can inadvertently be transcribed and may not be corrected upon review.

## 2014-10-22 NOTE — Telephone Encounter (Signed)
Gave avs & cal for June

## 2015-03-02 ENCOUNTER — Encounter: Payer: Self-pay | Admitting: Internal Medicine

## 2015-04-17 ENCOUNTER — Ambulatory Visit (HOSPITAL_COMMUNITY)
Admission: RE | Admit: 2015-04-17 | Discharge: 2015-04-17 | Disposition: A | Discharge status: Home / Self Care | Payer: Medicare Other | Source: Ambulatory Visit | Attending: Internal Medicine | Admitting: Internal Medicine

## 2015-04-17 ENCOUNTER — Other Ambulatory Visit (HOSPITAL_BASED_OUTPATIENT_CLINIC_OR_DEPARTMENT_OTHER): Payer: Medicare Other

## 2015-04-17 DIAGNOSIS — Z08 Encounter for follow-up examination after completed treatment for malignant neoplasm: Secondary | ICD-10-CM | POA: Insufficient documentation

## 2015-04-17 DIAGNOSIS — Z85118 Personal history of other malignant neoplasm of bronchus and lung: Secondary | ICD-10-CM | POA: Diagnosis not present

## 2015-04-17 DIAGNOSIS — C3492 Malignant neoplasm of unspecified part of left bronchus or lung: Secondary | ICD-10-CM | POA: Diagnosis not present

## 2015-04-17 LAB — CBC WITH DIFFERENTIAL/PLATELET
BASO%: 0.9 % (ref 0.0–2.0)
BASOS ABS: 0 10*3/uL (ref 0.0–0.1)
EOS%: 1.2 % (ref 0.0–7.0)
Eosinophils Absolute: 0.1 10*3/uL (ref 0.0–0.5)
HCT: 41 % (ref 34.8–46.6)
HGB: 13.8 g/dL (ref 11.6–15.9)
LYMPH%: 20.4 % (ref 14.0–49.7)
MCH: 30.3 pg (ref 25.1–34.0)
MCHC: 33.6 g/dL (ref 31.5–36.0)
MCV: 90.1 fL (ref 79.5–101.0)
MONO#: 0.3 10*3/uL (ref 0.1–0.9)
MONO%: 6.4 % (ref 0.0–14.0)
NEUT#: 3.3 10*3/uL (ref 1.5–6.5)
NEUT%: 71.1 % (ref 38.4–76.8)
Platelets: 228 10*3/uL (ref 145–400)
RBC: 4.55 10*6/uL (ref 3.70–5.45)
RDW: 13.6 % (ref 11.2–14.5)
WBC: 4.6 10*3/uL (ref 3.9–10.3)
lymph#: 0.9 10*3/uL (ref 0.9–3.3)

## 2015-04-17 LAB — COMPREHENSIVE METABOLIC PANEL (CC13)
ALBUMIN: 3.9 g/dL (ref 3.5–5.0)
ALT: 11 U/L (ref 0–55)
ANION GAP: 9 meq/L (ref 3–11)
AST: 16 U/L (ref 5–34)
Alkaline Phosphatase: 96 U/L (ref 40–150)
BUN: 17.1 mg/dL (ref 7.0–26.0)
CO2: 26 mEq/L (ref 22–29)
Calcium: 9.5 mg/dL (ref 8.4–10.4)
Chloride: 107 mEq/L (ref 98–109)
Creatinine: 0.9 mg/dL (ref 0.6–1.1)
EGFR: 67 mL/min/{1.73_m2} — AB (ref >90)
Glucose: 101 mg/dl (ref 70–140)
Potassium: 4.9 mEq/L (ref 3.5–5.1)
Sodium: 141 mEq/L (ref 136–145)
Total Bilirubin: 0.6 mg/dL (ref 0.20–1.20)
Total Protein: 6.7 g/dL (ref 6.4–8.3)

## 2015-04-17 MED ORDER — IOHEXOL 300 MG/ML  SOLN
100.0000 mL | Freq: Once | INTRAMUSCULAR | Status: AC | PRN
  Administered 2015-04-17: 80 mL via INTRAVENOUS

## 2015-04-23 ENCOUNTER — Telehealth: Payer: Self-pay | Admitting: Internal Medicine

## 2015-04-23 ENCOUNTER — Encounter: Payer: Self-pay | Admitting: Internal Medicine

## 2015-04-23 ENCOUNTER — Ambulatory Visit (HOSPITAL_BASED_OUTPATIENT_CLINIC_OR_DEPARTMENT_OTHER): Payer: Medicare Other | Admitting: Internal Medicine

## 2015-04-23 VITALS — BP 135/78 | HR 65 | Temp 98.2°F | Resp 18 | Ht 69.0 in | Wt 166.2 lb

## 2015-04-23 DIAGNOSIS — Z85118 Personal history of other malignant neoplasm of bronchus and lung: Secondary | ICD-10-CM

## 2015-04-23 DIAGNOSIS — C3492 Malignant neoplasm of unspecified part of left bronchus or lung: Secondary | ICD-10-CM

## 2015-04-23 NOTE — Telephone Encounter (Signed)
Gave and printed appt sched and avs for pt for June 2017 °

## 2015-04-23 NOTE — Progress Notes (Signed)
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    PRIOR THERAPY:  1) 6 cycles of chemotherapy with cisplatin and VP-16 in combination with Neulasta from 07/14/2010  through 11/02/2010. She received radiation treatments to the chest from 10/04/2010 through 11/19/2010. 2) prophylactic whole- brain radiation 2500 cGy in 10 fractions from 12/07/2010 through 12/20/2010.  CURRENT THERAPY: Observation.  ADVANCE DIRECTIVES: The patient does not have metastatic and is not interested in getting information at this point.  INTERVAL HISTORY: Hailey Hill 64 y.o. female returns to the clinic today for six-months followup visit. The patient has been observation for the last 4 years. She is feeling fine today with no specific complaints. She denied having any significant chest pain, shortness of breath, cough or hemoptysis. She denied having any weight loss or night sweats. She has no nausea or vomiting. She continues to have persistent peripheral neuropathy. She had repeat CT scan of the chest performed recently and she is here for evaluation and discussion of her scan results.  MEDICAL HISTORY: Past Medical History  Diagnosis Date  . SVC syndrome   . Dyslipidemia   . Peripheral neuropathy   . Thrombus 81/27/5170    Left basilic vein,  Left subclavian vein  . Lung cancer dx'd 07/2010    ALLERGIES:  is allergic to promethazine hcl.  MEDICATIONS:  Current Outpatient Prescriptions  Medication Sig Dispense Refill  . aspirin 81 MG tablet Take 81 mg by mouth daily.       No current facility-administered medications for this  visit.    REVIEW OF SYSTEMS:  Constitutional: negative Eyes: negative Ears, nose, mouth, throat, and face: negative Respiratory: negative Cardiovascular: negative Gastrointestinal: negative Genitourinary:negative Integument/breast: negative Hematologic/lymphatic: negative Musculoskeletal:negative Neurological: negative Behavioral/Psych: negative Endocrine: negative Allergic/Immunologic: negative   PHYSICAL EXAMINATION: General appearance: alert, cooperative and no distress Head: Normocephalic, without obvious abnormality, atraumatic Neck: no adenopathy, no JVD, supple, symmetrical, trachea midline and thyroid not enlarged, symmetric, no tenderness/mass/nodules Lymph nodes: Cervical, supraclavicular, and axillary nodes normal. Resp: clear to auscultation bilaterally Back: symmetric, no curvature. ROM normal. No CVA tenderness. Cardio: regular rate and rhythm, S1, S2 normal, no murmur, click, rub or gallop GI: soft, non-tender; bowel sounds normal; no masses,  no organomegaly Extremities: extremities normal, atraumatic, no cyanosis or edema Neurologic: Alert and oriented X 3, normal strength and tone. Normal symmetric reflexes. Normal coordination and gait  ECOG PERFORMANCE STATUS: 0 - Asymptomatic  Blood pressure 135/78, pulse 65, temperature 98.2 F (36.8 C), temperature source Oral, resp. rate 18, height '5\' 9"'$  (1.753 m), weight 166 lb 3.2 oz (75.388 kg), SpO2 99 %.  LABORATORY DATA: Lab Results  Component Value Date   WBC 4.6 04/17/2015   HGB 13.8 04/17/2015   HCT 41.0 04/17/2015   MCV 90.1 04/17/2015   PLT 228 04/17/2015      Chemistry      Component Value Date/Time   NA 141 04/17/2015 0811   NA 141 06/08/2012 0848   K 4.9 04/17/2015 0811   K 4.5 06/08/2012 0848   CL 108* 01/09/2013 0913   CL 104 06/08/2012 0848   CO2 26 04/17/2015 0174  CO2 27 06/08/2012 0848   BUN 17.1 04/17/2015 0811   BUN 13 06/08/2012 0848   CREATININE 0.9 04/17/2015 0811   CREATININE  0.96 06/08/2012 0848      Component Value Date/Time   CALCIUM 9.5 04/17/2015 0811   CALCIUM 9.8 06/08/2012 0848   ALKPHOS 96 04/17/2015 0811   ALKPHOS 86 06/08/2012 0848   AST 16 04/17/2015 0811   AST 17 06/08/2012 0848   ALT 11 04/17/2015 0811   ALT 14 06/08/2012 0848   BILITOT 0.60 04/17/2015 0811   BILITOT 0.5 06/08/2012 0848       RADIOGRAPHIC STUDIES: Ct Chest W Contrast  04/17/2015   CLINICAL DATA:  Subsequent encounter for small-cell lung cancer.  EXAM: CT CHEST WITH CONTRAST  TECHNIQUE: Multidetector CT imaging of the chest was performed during intravenous contrast administration.  CONTRAST:  79m OMNIPAQUE IOHEXOL 300 MG/ML  SOLN  COMPARISON:  10/16/2014.  FINDINGS: Mediastinum / Lymph Nodes: There is no axillary lymphadenopathy. No mediastinal or hilar lymphadenopathy. The a amorphous soft tissue attenuation in the left hilum is stable. Thoracic esophagus is unremarkable. Heart size is normal. No pericardial effusion.  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 No focal airspace consolidation or pulmonary edema. Previously described 5 mm subpleural left lower lobe nodule (image 37 series 7 today) is unchanged at 4 to 5 mm. 2-3 mm right middle lobe nodule (image 35 series 7) is also unchanged. No new or progressive findings.  MSK / Soft Tissues: Bone windows reveal no worrisome lytic or sclerotic osseous lesions.  Upper Abdomen: No nodular mass within the visualized portion of either adrenal gland pain. Posterior splenic cyst is stable a 10 mm.  IMPRESSION: 1. Stable exam. No new or progressive findings to suggest recurrent or metastatic disease in the chest.   Electronically Signed   By: EMisty StanleyM.D.   On: 04/17/2015 10:10    ASSESSMENT AND PLAN: This is a very pleasant 64years old white female with limited stage small cell lung cancer diagnosed in September of 2011, status post systemic chemotherapy with cisplatin and etoposide for 6 cycles  concurrent with radiation followed by prophylactic irradiation. She has been observation for more than 4 years with no evidence for disease recurrence. The patient has no complaints today. The recent CT scan of the chest showed no evidence for disease recurrence. I discussed the scan results with the patient today and recommended for her to continue on observation with repeat CT scan of the chest in one year. She was advised to call immediately if she has any concerning symptoms in the interval. The patient voices understanding of current disease status and treatment options and is in agreement with the current care plan.  All questions were answered. The patient knows to call the clinic with any problems, questions or concerns. We can certainly see the patient much sooner if necessary.  Disclaimer: This note was dictated with voice recognition software. Similar sounding words can inadvertently be transcribed and may not be corrected upon review.

## 2015-12-14 ENCOUNTER — Encounter (HOSPITAL_COMMUNITY): Payer: Self-pay | Admitting: *Deleted

## 2015-12-14 ENCOUNTER — Emergency Department (HOSPITAL_COMMUNITY): Payer: Medicare Other

## 2015-12-14 ENCOUNTER — Observation Stay (HOSPITAL_COMMUNITY)
Admission: EM | Admit: 2015-12-14 | Discharge: 2015-12-16 | Disposition: A | Discharge status: Home / Self Care | Payer: Medicare Other | Attending: Internal Medicine | Admitting: Internal Medicine

## 2015-12-14 ENCOUNTER — Telehealth: Payer: Self-pay | Admitting: *Deleted

## 2015-12-14 DIAGNOSIS — Z85118 Personal history of other malignant neoplasm of bronchus and lung: Secondary | ICD-10-CM | POA: Insufficient documentation

## 2015-12-14 DIAGNOSIS — Z7982 Long term (current) use of aspirin: Secondary | ICD-10-CM | POA: Diagnosis not present

## 2015-12-14 DIAGNOSIS — R079 Chest pain, unspecified: Secondary | ICD-10-CM | POA: Diagnosis not present

## 2015-12-14 DIAGNOSIS — Z9221 Personal history of antineoplastic chemotherapy: Secondary | ICD-10-CM | POA: Insufficient documentation

## 2015-12-14 DIAGNOSIS — G629 Polyneuropathy, unspecified: Secondary | ICD-10-CM | POA: Insufficient documentation

## 2015-12-14 DIAGNOSIS — Z8249 Family history of ischemic heart disease and other diseases of the circulatory system: Secondary | ICD-10-CM | POA: Insufficient documentation

## 2015-12-14 DIAGNOSIS — E785 Hyperlipidemia, unspecified: Secondary | ICD-10-CM | POA: Diagnosis not present

## 2015-12-14 DIAGNOSIS — Z923 Personal history of irradiation: Secondary | ICD-10-CM | POA: Insufficient documentation

## 2015-12-14 DIAGNOSIS — Z87891 Personal history of nicotine dependence: Secondary | ICD-10-CM | POA: Diagnosis not present

## 2015-12-14 LAB — LIPASE, BLOOD: LIPASE: 30 U/L (ref 11–51)

## 2015-12-14 LAB — CBC
HEMATOCRIT: 40.8 % (ref 36.0–46.0)
HEMOGLOBIN: 13.8 g/dL (ref 12.0–15.0)
MCH: 29.9 pg (ref 26.0–34.0)
MCHC: 33.8 g/dL (ref 30.0–36.0)
MCV: 88.5 fL (ref 78.0–100.0)
Platelets: 243 10*3/uL (ref 150–400)
RBC: 4.61 MIL/uL (ref 3.87–5.11)
RDW: 13.9 % (ref 11.5–15.5)
WBC: 6.9 10*3/uL (ref 4.0–10.5)

## 2015-12-14 LAB — COMPREHENSIVE METABOLIC PANEL
ALT: 15 U/L (ref 14–54)
AST: 15 U/L (ref 15–41)
Albumin: 3.8 g/dL (ref 3.5–5.0)
Alkaline Phosphatase: 77 U/L (ref 38–126)
Anion gap: 14 (ref 5–15)
BILIRUBIN TOTAL: 0.6 mg/dL (ref 0.3–1.2)
BUN: 12 mg/dL (ref 6–20)
CALCIUM: 9.6 mg/dL (ref 8.9–10.3)
CO2: 23 mmol/L (ref 22–32)
CREATININE: 0.84 mg/dL (ref 0.44–1.00)
Chloride: 104 mmol/L (ref 101–111)
GFR calc Af Amer: >60 mL/min (ref >60)
Glucose, Bld: 97 mg/dL (ref 65–99)
Potassium: 4 mmol/L (ref 3.5–5.1)
Sodium: 141 mmol/L (ref 135–145)
TOTAL PROTEIN: 6.3 g/dL — AB (ref 6.5–8.1)

## 2015-12-14 LAB — I-STAT TROPONIN, ED: Troponin i, poc: 0 ng/mL (ref 0.00–0.08)

## 2015-12-14 MED ORDER — ASPIRIN EC 81 MG PO TBEC
81.0000 mg | DELAYED_RELEASE_TABLET | Freq: Every day | ORAL | Status: DC
  Administered 2015-12-15 – 2015-12-16 (×2): 81 mg via ORAL
  Filled 2015-12-14 (×2): qty 1

## 2015-12-14 MED ORDER — ASPIRIN 81 MG PO CHEW
324.0000 mg | CHEWABLE_TABLET | Freq: Once | ORAL | Status: AC
  Administered 2015-12-14: 324 mg via ORAL
  Filled 2015-12-14: qty 4

## 2015-12-14 MED ORDER — ENOXAPARIN SODIUM 40 MG/0.4ML ~~LOC~~ SOLN
40.0000 mg | SUBCUTANEOUS | Status: DC
  Filled 2015-12-14: qty 0.4

## 2015-12-14 MED ORDER — ACETAMINOPHEN 325 MG PO TABS: 650.0000 mg | ORAL_TABLET | ORAL | Status: DC | PRN

## 2015-12-14 MED ORDER — ONDANSETRON HCL 4 MG/2ML IJ SOLN: 4.0000 mg | Freq: Four times a day (QID) | INTRAMUSCULAR | Status: DC | PRN

## 2015-12-14 NOTE — ED Notes (Signed)
MD at bedside. 

## 2015-12-14 NOTE — ED Notes (Signed)
Patient transported to X-ray 

## 2015-12-14 NOTE — ED Provider Notes (Signed)
CSN: [id number]     Arrival date & time 12/14/15  1508 History   First MD Initiated Contact with Patient 12/14/15 2051     Chief Complaint  Patient presents with  . Chest Pain  . Abnormal ECG     (Consider location/radiation/quality/duration/timing/severity/associated sxs/prior Treatment) HPI Patient states she is an avid walker and walks roughly 3-5 miles a day. Over the last week she's developed chest "aching" and left arm pain while walking. She states the pain resolves when she rests for 5 minutes. She last experienced the pain this morning around 11 AM. The pain is currently resolved. She took 81 mg of aspirin and went to urgent care and was referred to the emergency department. She denies any shortness of breath associated with the ache. She's had no new lower extremity swelling or pain. No history of coronary artery disease. Does have a distant history of smoking and previous lung cancer though she is in remission. Denies any fever or chills. Past Medical History  Diagnosis Date  . SVC syndrome   . Dyslipidemia   . Peripheral neuropathy ([place])   . Thrombus 49/70/2637    Left basilic vein,  Left subclavian vein  . Lung cancer ([place]) dx'd 07/2010   Past Surgical History  Procedure Laterality Date  . Cervical biopsy     Family History  Problem Relation Age of Onset  . Nephrolithiasis Son   . Heart attack Father   . Heart attack Paternal Uncle   . Heart attack Paternal Grandfather    Social History  Substance Use Topics  . Smoking status: Former Smoker    Quit date: 03/08/2010  . Smokeless tobacco: None  . Alcohol Use: No   OB History    No data available     Review of Systems  Constitutional: Negative for fever and chills.  Respiratory: Negative for shortness of breath.   Cardiovascular: Positive for chest pain. Negative for palpitations and leg swelling.  Gastrointestinal: Negative for nausea, vomiting, abdominal pain and diarrhea.  Musculoskeletal: Negative for back  pain, neck pain and neck stiffness.  Skin: Negative for rash and wound.  Neurological: Negative for dizziness, syncope, weakness, light-headedness, numbness and headaches.  All other systems reviewed and are negative.     Allergies  Promethazine hcl  Home Medications   Prior to Admission medications   Medication Sig Start Date End Date Taking? Authorizing Provider  aspirin 81 MG tablet Take 81 mg by mouth daily.     Yes Historical Provider, MD   BP 136/71 mmHg  Pulse 87  Temp(Src) 98.1 F (36.7 C) (Oral)  Resp 20  Ht '5\' 9"'$  (1.753 m)  Wt 170 lb (77.111 kg)  BMI 25.09 kg/m2  SpO2 99% Physical Exam  Constitutional: She is oriented to person, place, and time. She appears well-developed and well-nourished. No distress.  HENT:  Head: Normocephalic and atraumatic.  Mouth/Throat: Oropharynx is clear and moist. No oropharyngeal exudate.  Eyes: EOM are normal. Pupils are equal, round, and reactive to light.  Neck: Normal range of motion. Neck supple. No JVD present.  Cardiovascular: Normal rate and regular rhythm.  Exam reveals no gallop and no friction rub.   No murmur heard. Pulmonary/Chest: Effort normal and breath sounds normal. No respiratory distress. She has no wheezes. She has no rales. She exhibits no tenderness.  Abdominal: Soft. Bowel sounds are normal. She exhibits no distension and no mass. There is no tenderness. There is no rebound and no guarding.  Musculoskeletal: Normal range of  motion. She exhibits no edema or tenderness.  No lower extremity swelling or pain. Distal pulses equal and intact.  Neurological: She is alert and oriented to person, place, and time.  Skin: Skin is warm and dry. No rash noted. No erythema.  Psychiatric: She has a normal mood and affect. Her behavior is normal.  Nursing note and vitals reviewed.   ED Course  Procedures (including critical care time) Labs Review Labs Reviewed  COMPREHENSIVE METABOLIC PANEL - Abnormal; Notable for the  following:    Total Protein 6.3 (*)    All other components within normal limits  CBC  LIPASE, BLOOD  TROPONIN I  TROPONIN I  TROPONIN I  I-STAT TROPOININ, ED    Imaging Review No results found. I have personally reviewed and evaluated these images and lab results as part of my medical decision-making.   EKG Interpretation   Date/Time:  Monday December 14 2015 15:23:24 EST Ventricular Rate:  92 PR Interval:  150 QRS Duration: 96 QT Interval:  364 QTC Calculation: 450 R Axis:   64 Text Interpretation:  Normal sinus rhythm Nonspecific ST abnormality  Abnormal ECG Confirmed by Starnisha Batrez  MD, Willadeen Colantuono ([id number]) on 12/14/2015 8:53:18  PM      MDM   Final diagnoses:  Chest pain, unspecified chest pain type   Patient remains symptom-free. Discussed with cardiology and recommended admission to [facility]. Dr. Alcario Drought will see patient in the emergency department and admit.     Julianne Rice, MD 12/18/15 [phone]

## 2015-12-14 NOTE — Telephone Encounter (Signed)
FYI "Dr. Julien Nordmann told me to call if anything 'weirder than weird' happened to me.  I'm on yearly maintenance.  December I had sinusitis and bronchitis.  I tried to treat with OTC meds.  Eventually went to Urgent Care but refused inhalers because I have lung cancer.  It took a month to get over this so I was not able to do my daily walking.  I also had to stop drinking ice in beverages because cold beverages hurt my esophagus.  The past few days I resumed walking.  I now experience an ache in my sternum and under my left arm with exertion.  I'm fine unless I'm walking up a hill or exerting myself.  Today I walked one mile and used to walk three to five miles.  No further pain walking around the house."  Advised she go to the ED for evaluation of heart.  "I've called Dr. Thurman Coyer office because I take my mother there. Heart attacks are prevalent  In my family.  I do not want to go to the ED with sick people.  Being around sick people is why I was so sick the past month."  Again advised she go to the ED or urgent care.  Wash hands, use sanitizer and wear a mask to protect herself but to go to ED for further evaluation as she is not a patient of Dr. Thurman Coyer.   "I will wait for Dr. Thurman Coyer office to call.  If they do not call, I will go to the hospital."

## 2015-12-14 NOTE — ED Notes (Signed)
Pt reports mid chest discomfort recently when walking, pain radiated down left arm, was severe enough today that she took an ASA and went to ucc. Sent here due to abnormal ekg.

## 2015-12-14 NOTE — H&P (Signed)
[facility] History and Physical  Hailey Hill DOB: July 20, 1951 DOA: 12/14/2015  Referring physician: EDP PCP: Eilleen Kempf., MD   Chief Complaint: Chest pain   HPI: Hailey Hill is a 65 y.o. female with h/o SCLC in 2011, this was treated with radiation and chemo and successfully put into remission where it has remained for over 4 years, last CT scan was in June of 2016 and showed no changes.  Patient presents to the ED with c/o central chest "aching" and left arm pain.  Symptoms are worse with exertion (walking), and resolve if she rests for 5 mins.  Last occurred this morning at 11 AM, is currently resolved.  She went to UC who referred her to the ED.  No SOB associated with ache, no personal history of heart disease.  Review of Systems: Systems reviewed.  As above, otherwise negative  Past Medical History  Diagnosis Date  . SVC syndrome   . Dyslipidemia   . Peripheral neuropathy ([place])   . Thrombus 73/71/0626    Left basilic vein,  Left subclavian vein  . Lung cancer ([place]) dx'd 07/2010   History reviewed. No pertinent past surgical history. Social History:  reports that she quit smoking about 5 years ago. She does not have any smokeless tobacco history on file. Her alcohol and drug histories are not on file.  Allergies  Allergen Reactions  . Promethazine Hcl Other (See Comments)    Pt reports feeling shaky and "sick" after phenergan    Family History  Problem Relation Age of Onset  . Nephrolithiasis Son      Prior to Admission medications   Medication Sig Start Date End Date Taking? Authorizing Provider  aspirin 81 MG tablet Take 81 mg by mouth daily.     Yes Historical Provider, MD   Physical Exam: Filed Vitals:   12/14/15 2130 12/14/15 2200  BP: 142/80 129/73  Pulse: 85 84  Temp:    Resp: 24 21    BP 129/73 mmHg  Pulse 84  Temp(Src) 98.3 F (36.8 C) (Oral)  Resp 21  SpO2 97%  General Appearance:    Alert, oriented, no distress,  appears stated age  Head:    Normocephalic, atraumatic  Eyes:    PERRL, EOMI, sclera non-icteric        Nose:   Nares without drainage or epistaxis. Mucosa, turbinates normal  Throat:   Moist mucous membranes. Oropharynx without erythema or exudate.  Neck:   Supple. No carotid bruits.  No thyromegaly.  No lymphadenopathy.   Back:     No CVA tenderness, no spinal tenderness  Lungs:     Clear to auscultation bilaterally, without wheezes, rhonchi or rales  Chest wall:    No tenderness to palpitation  Heart:    Regular rate and rhythm without murmurs, gallops, rubs  Abdomen:     Soft, non-tender, nondistended, normal bowel sounds, no organomegaly  Genitalia:    deferred  Rectal:    deferred  Extremities:   No clubbing, cyanosis or edema.  Pulses:   2+ and symmetric all extremities  Skin:   Skin color, texture, turgor normal, no rashes or lesions  Lymph nodes:   Cervical, supraclavicular, and axillary nodes normal  Neurologic:   CNII-XII intact. Normal strength, sensation and reflexes      throughout    Labs on Admission:  Basic Metabolic Panel:  Recent Labs Lab 12/14/15 2105  NA 141  K 4.0  CL 104  CO2 23  GLUCOSE 97  BUN 12  CREATININE 0.84  CALCIUM 9.6   Liver Function Tests:  Recent Labs Lab 12/14/15 2105  AST 15  ALT 15  ALKPHOS 77  BILITOT 0.6  PROT 6.3*  ALBUMIN 3.8    Recent Labs Lab 12/14/15 2105  LIPASE 30   No results for input(s): AMMONIA in the last 168 hours. CBC:  Recent Labs Lab 12/14/15 2105  WBC 6.9  HGB 13.8  HCT 40.8  MCV 88.5  PLT 243   Cardiac Enzymes: No results for input(s): CKTOTAL, CKMB, CKMBINDEX, TROPONINI in the last 168 hours.  BNP (last 3 results) No results for input(s): PROBNP in the last 8760 hours. CBG: No results for input(s): GLUCAP in the last 168 hours.  Radiological Exams on Admission: Dg Chest 2 View  12/14/2015  CLINICAL DATA:  Mid chest discomfort extending into left arm for the past week, history of  lung cancer EXAM: CHEST  2 VIEW COMPARISON:  04/17/2015 FINDINGS: The heart size and vascular pattern are normal. The right lung is clear. In the medial left upper lobe there is an area of irregular opacity and architectural distortion measuring 5.5 cm consistent with radiation fibrosis as seen on 04/17/2015 CT scan. Postsurgical change left apex. No consolidation or effusion. Bony thorax intact. IMPRESSION: Chronic findings with no acute abnormality Electronically Signed   By: Skipper Cliche M.D.   On: 12/14/2015 15:49    EKG: Independently reviewed.  Assessment/Plan Active Problems:   Chest pain with moderate risk for cardiac etiology   1. Chest pain - 1. Chest pain obs pathway 2. Serial trops 3. EDP spoke with cards who say to call them back in AM if trops all negative for IP stress test 4. CXR shows no acute changes 5. If stress test is negative would then consider imaging (CT chest vs PET scan of chest, probably call oncology at that point to decide which) due to h/o SCLC which is currently believed to be in remission.    Code Status: Full  Family Communication: Family at bedside Disposition Plan: Admit to inpatient   Time spent: 26 min  Monte Bronder M. [facility] Pager [phone]  If 7AM-7PM, please contact the day team taking care of the patient [email or web address] Password [email or web address] 12/14/2015, 11:04 PM

## 2015-12-15 ENCOUNTER — Inpatient Hospital Stay (HOSPITAL_BASED_OUTPATIENT_CLINIC_OR_DEPARTMENT_OTHER): Payer: Medicare Other

## 2015-12-15 ENCOUNTER — Encounter (HOSPITAL_COMMUNITY): Payer: Self-pay | Admitting: General Practice

## 2015-12-15 ENCOUNTER — Inpatient Hospital Stay (HOSPITAL_COMMUNITY): Payer: Medicare Other

## 2015-12-15 DIAGNOSIS — R079 Chest pain, unspecified: Secondary | ICD-10-CM | POA: Diagnosis not present

## 2015-12-15 LAB — TROPONIN I
Troponin I: <0.03 ng/mL (ref <0.031)
Troponin I: <0.03 ng/mL (ref <0.031)
Troponin I: <0.03 ng/mL (ref <0.031)

## 2015-12-15 LAB — NM MYOCAR MULTI W/SPECT W/WALL MOTION / EF
CHL CUP NUCLEAR SDS: 2
CHL CUP NUCLEAR SRS: 6
CHL CUP NUCLEAR SSS: 8
CHL CUP RESTING HR STRESS: 81 {beats}/min
CSEPEDS: 50 s
CSEPPHR: 148 {beats}/min
Estimated workload: 9.8 METS
Exercise duration (min): 7 min
LV sys vol: 17 mL
LVDIAVOL: 53 mL
MPHR: 158 {beats}/min
NUC STRESS TID: 1.56
Percent HR: 93 %
RATE: 0

## 2015-12-15 MED ORDER — SODIUM CHLORIDE 0.9 % IV SOLN
INTRAVENOUS | Status: DC
  Administered 2015-12-15: 23:00:00 via INTRAVENOUS

## 2015-12-15 MED ORDER — TECHNETIUM TC 99M SESTAMIBI GENERIC - CARDIOLITE
30.0000 | Freq: Once | INTRAVENOUS | Status: AC | PRN
  Administered 2015-12-15: 30 via INTRAVENOUS

## 2015-12-15 MED ORDER — TECHNETIUM TC 99M SESTAMIBI GENERIC - CARDIOLITE
10.0000 | Freq: Once | INTRAVENOUS | Status: AC | PRN
  Administered 2015-12-15: 10 via INTRAVENOUS

## 2015-12-15 MED ORDER — IOHEXOL 350 MG/ML SOLN
100.0000 mL | Freq: Once | INTRAVENOUS | Status: AC | PRN
  Administered 2015-12-15: 100 mL via INTRAVENOUS

## 2015-12-15 NOTE — Consult Note (Signed)
CARDIOLOGY CONSULT NOTE   Patient ID: HYUN REALI MRN: [id number], DOB/AGE: Apr 02, 1951   Admit date: 12/14/2015 Date of Consult: 12/15/2015   Primary Physician: Eilleen Kempf., MD Primary Cardiologist: Dr. Gwenlyn Found Oncologist: Dr. Julien Nordmann  Pt. 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 SVC syndrome   . Dyslipidemia   . Peripheral neuropathy ([place])   . Thrombus 40/98/1191    Left basilic vein,  Left subclavian vein  . Lung cancer ([place]) dx'd 07/2010    Past Surgical History  Procedure Laterality Date  . Cervical biopsy       Allergies  Allergies  Allergen Reactions  . 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 Her last echocardiogram done in 2011 showed normal EF. All three diagnosis occured in 2011 after she was diagnosed with small cell lung cancer. She did use Lovenox injection for his left subclavian vein thrombosis for a while. Her last chemotherapy and radiation therapy was done in 2012, she has not had any recurrence since then. Her last CT of the chest was done in June 2016 which show no recurrence of small cell lung cancer.She is only on aspirin at home. A month ago, she has had a bronchitis episode. She has since recovered. She has been avid walker and tries to go back to her previous exercise level. After she  got over the bronchitis, she got back to her exercise routine. Once she hit the 1 mile mark, she began to notice substernal burning sensation in the chest and also the left elbow pain that is relieved with rest. She is not sure if it was still cold air that was causing the problem. She also noticed the same substernal burning sensation when she drinks cold water.  although when she does drink cold water, left elbow pain does not occur at the same time. She eventually sought medical attention at [facility] given the concern that she has family history of CAD on her father's side. Initial EKG was negative for acute changes in ST or T-wave segment. Serial troponin has been negative. She has not had any recurrence of the chest discomfort since admission. Cardiology has been consulted for chest pain.      Inpatient Medications  . aspirin EC  81 mg Oral Daily  . enoxaparin (LOVENOX) injection  40 mg Subcutaneous Q24H    Family History Family History  Problem Relation Age of Onset  . Nephrolithiasis Son   . Heart attack Father   . Heart attack Paternal Uncle   . Heart attack Paternal Grandfather      Social History Social History   Social History  . Marital Status: Married    Spouse Name: N/A  . Number of Children: N/A  . Years of Education: N/A   Occupational History  . Not on file.   Social History Main Topics  .  Smoking status: Former Smoker    Quit date: 03/08/2010  . Smokeless tobacco: Not on file  . Alcohol Use: No  . Drug Use: No  . Sexual Activity: Yes   Other Topics Concern  . Not on file   Social History Narrative     Review of Systems  General:  No chills, fever, night sweats or weight changes.  Cardiovascular:  No dyspnea on exertion, edema, orthopnea, palpitations, paroxysmal nocturnal dyspnea. +chest pain Dermatological: No rash, lesions/masses Respiratory: No cough, dyspnea Urologic: No hematuria, dysuria Abdominal:   No nausea, vomiting,  diarrhea, bright red blood per rectum, melena, or hematemesis Neurologic:  No visual changes, wkns, changes in mental status. All other systems reviewed and are otherwise negative except as noted above.  Physical Exam  Blood pressure 109/62, pulse 72, temperature 98.3 F (36.8 C), temperature source Oral, resp. rate 18, height '5\' 9"'$  (1.753 m), weight 170 lb (77.111 kg), SpO2 98 %.  General: Pleasant, NAD Psych: Normal affect. Neuro: Alert and oriented X 3. Moves all extremities spontaneously. HEENT: Normal  Neck: Supple without bruits or JVD. Lungs:  Resp regular and unlabored, CTA. Heart: RRR no s3, s4, or murmurs. Abdomen: Soft, non-tender, non-distended, BS + x 4.  Extremities: No clubbing, cyanosis or edema. DP/PT/Radials 2+ and equal bilaterally.  Labs   Recent Labs  12/14/15 2355 12/15/15 0443  TROPONINI <0.03  <0.03 <0.03   Lab Results  Component Value Date   WBC 6.9 12/14/2015   HGB 13.8 12/14/2015   HCT 40.8 12/14/2015   MCV 88.5 12/14/2015   PLT 243 12/14/2015     Recent Labs Lab 12/14/15 2105  NA 141  K 4.0  CL 104  CO2 23  BUN 12  CREATININE 0.84  CALCIUM 9.6  PROT 6.3*  BILITOT 0.6  ALKPHOS 77  ALT 15  AST 15  GLUCOSE 97   No results found for: CHOL, HDL, LDLCALC, TRIG No results found for: DDIMER  Radiology/Studies  Dg Chest 2 View  12/14/2015  CLINICAL DATA:  Mid chest discomfort extending into left arm for the past week, history of lung cancer EXAM: CHEST  2 VIEW COMPARISON:  04/17/2015 FINDINGS: The heart size and vascular pattern are normal. The right lung is clear. In the medial left upper lobe there is an area of irregular opacity and architectural distortion measuring 5.5 cm consistent with radiation fibrosis as seen on 04/17/2015 CT scan. Postsurgical change left apex. No consolidation or effusion. Bony thorax intact. IMPRESSION: Chronic findings with no acute abnormality Electronically Signed   By: Skipper Cliche M.D.   On: 12/14/2015  15:49    ECG  Normal sinus rhythm without significant ST-T wave changes.  ASSESSMENT AND PLAN  1. Chest pain with typical and atypical features  - chest pain worse with exertion, relieved with rest, but also occur with drinking cold water. She is not sure if she is still recovering from recent bronchitis and cold weather is irritating her lung or is this angina  - her lung is clear today, in order to further differentiate, we will obtain treadmill nuclear stress test  - given h/o chemo therapy, will obtain echocardiogram as well  2. 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 history of superior vena cava syndrome  4. history of left subclavian vein thrombosis  Signed, Almyra Deforest, PA-C 12/15/2015, 10:38 AM   .Agree with note by Almyra Deforest PA-C  Pt with + CRF (discontinued Tob and Fx), new  onset effort angina. EKG w/o acute changes. Exam benign. Labs OK. Will get 2D and GXT MV. If +---> Cath.    Lorretta Harp, M.D., [place], [facility], [place] [address] [place], [place]  [id number]  [phone] 12/15/2015 10:47 AM

## 2015-12-15 NOTE — Progress Notes (Signed)
[facility] PROGRESS NOTE  Hailey Hill NTI:[id number] DOB: 1951-06-13 DOA: 12/14/2015 PCP: Eilleen Kempf., MD  Assessment/Plan: Hailey Hill is a 65 y.o. female with h/o SCLC in 2011, this was treated with radiation and chemo and successfully put into remission where it has remained for over 4 years, last CT scan was in June of 2016 and showed no changes. Patient presents to the ED with c/o central chest "aching" and left arm pain. Symptoms are worse with exertion (walking), and resolve if she rests for 5 mins.  1-Chest pain, Dyspnea on exertion: Troponin negative.  Myoview: normal. Hb normal.  ECHO pending.  Will get CT angio chest to rule out PE>   2-History of SCL cancer, on remission.   Code Status: Full code.  Family Communication: care discussed with patient  Disposition Plan: await ECHO, and CT angio.    Consultants:  Cardiology   Procedures:  Stress test  Antibiotics: None  HPI/Subjective: Feeling ok, no chest pain   Objective: Filed Vitals:   12/15/15 1416 12/15/15 1418  BP: 172/69 153/72  Pulse: 92 92  Temp:    Resp:      Intake/Output Summary (Last 24 hours) at 12/15/15 1643 Last data filed at 12/15/15 0900  Gross per 24 hour  Intake      0 ml  Output      0 ml  Net      0 ml   Filed Weights   12/15/15 0021  Weight: 77.111 kg (170 lb)    Exam:   General:  NAD  Cardiovascular: S 1, S 2 RRR  Respiratory: CTA  Abdomen: BS present, soft, nt   Musculoskeletal: no edema  Data Reviewed: Basic Metabolic Panel:  Recent Labs Lab 12/14/15 2105  NA 141  K 4.0  CL 104  CO2 23  GLUCOSE 97  BUN 12  CREATININE 0.84  CALCIUM 9.6   Liver Function Tests:  Recent Labs Lab 12/14/15 2105  AST 15  ALT 15  ALKPHOS 77  BILITOT 0.6  PROT 6.3*  ALBUMIN 3.8    Recent Labs Lab 12/14/15 2105  LIPASE 30   No results for input(s): AMMONIA in the last 168 hours. CBC:  Recent Labs Lab 12/14/15 2105  WBC 6.9  HGB 13.8   HCT 40.8  MCV 88.5  PLT 243   Cardiac Enzymes:  Recent Labs Lab 12/14/15 2355 12/15/15 0443  TROPONINI <0.03  <0.03 <0.03   BNP (last 3 results) No results for input(s): BNP in the last 8760 hours.  ProBNP (last 3 results) No results for input(s): PROBNP in the last 8760 hours.  CBG: No results for input(s): GLUCAP in the last 168 hours.  No results found for this or any previous visit (from the past 240 hour(s)).   Studies: Dg Chest 2 View  12/14/2015  CLINICAL DATA:  Mid chest discomfort extending into left arm for the past week, history of lung cancer EXAM: CHEST  2 VIEW COMPARISON:  04/17/2015 FINDINGS: The heart size and vascular pattern are normal. The right lung is clear. In the medial left upper lobe there is an area of irregular opacity and architectural distortion measuring 5.5 cm consistent with radiation fibrosis as seen on 04/17/2015 CT scan. Postsurgical change left apex. No consolidation or effusion. Bony thorax intact. IMPRESSION: Chronic findings with no acute abnormality Electronically Signed   By: Skipper Cliche M.D.   On: 12/14/2015 15:49   Nm Myocar Multi W/spect W/wall Motion / Ef  12/15/2015  Blood pressure demonstrated a hypertensive response to exercise.  There was no ST segment deviation noted during stress.  No T wave inversion was noted during stress.  The study is normal.  This is a low risk study.  The left ventricular ejection fraction is hyperdynamic (>65%).     Scheduled Meds: . aspirin EC  81 mg Oral Daily  . enoxaparin (LOVENOX) injection  40 mg Subcutaneous Q24H   Continuous Infusions:   Active Problems:   Chest pain with moderate risk for cardiac etiology     Time spent: 35 minutes.     Niel Hummer A  [facility] Pager [phone]. If 7PM-7AM, please contact night-coverage at [email or web address], password [facility] 12/15/2015, 4:43 PM  LOS: 1 day

## 2015-12-15 NOTE — Progress Notes (Signed)
   12/15/15 0021  Vitals  Temp 97.7 F (36.5 C)  Temp Source Oral  BP 136/88 mmHg  BP Location Right Arm  BP Method Automatic  Patient Position (if appropriate) Lying  Pulse Rate 78  Resp 16  Oxygen Therapy  SpO2 100 %  O2 Device Room Air  Height and Weight  Weight 77.111 kg (170 lb)  Type of Scale Used Standing  Admitted pt to rm 2W39 from ED, pt alert and oriented, denied pain at this time, oriented to room, call bell placed within reach, admission orders carried out. Will continue to monitor.

## 2015-12-15 NOTE — Progress Notes (Signed)
Treadmill cardiolite completed.  Image interpretation to follow.  Nathalia Wismer, PAC

## 2015-12-16 ENCOUNTER — Inpatient Hospital Stay (HOSPITAL_BASED_OUTPATIENT_CLINIC_OR_DEPARTMENT_OTHER): Payer: Medicare Other

## 2015-12-16 DIAGNOSIS — R079 Chest pain, unspecified: Secondary | ICD-10-CM | POA: Diagnosis not present

## 2015-12-16 NOTE — Discharge Summary (Signed)
Discharge Summary  Hailey Hill KGM:[id number] DOB: 02-01-1951  PCP: Eilleen Kempf., MD  Admit date: 12/14/2015 Discharge date: 12/16/2015  Time spent: <25mns  Recommendations for Outpatient Follow-up:   1. Patient to establish care with a  PMD for general health maintenance and hospital follow up.   Discharge Diagnoses:  Active Hospital Problems   Diagnosis Date Noted  . Chest pain with moderate risk for cardiac etiology 12/14/2015    Resolved Hospital Problems   Diagnosis Date Noted Date Resolved  No resolved problems to display.    Discharge Condition: stable  Diet recommendation: heart healthy  Filed Weights   12/15/15 0021  Weight: 77.111 kg (170 lb)    History of present illness:  Hailey HARALSONis a 65y.o. female with h/o SCLC in 2011, this was treated with radiation and chemo and successfully put into remission where it has remained for over 4 years, last CT scan was in June of 2016 and showed no changes. Patient presents to the ED with c/o central chest "aching" and left arm pain. Symptoms are worse with exertion (walking), and resolve if she rests for 5 mins. Last occurred this morning at 11 AM, is currently resolved. She went to UC who referred her to the ED. No SOB associated with ache, no personal history of heart disease.  Hospital Course:  Active Problems:   Chest pain with moderate risk for cardiac etiology  1-Chest pain, Dyspnea on exertion: Troponin negative.  Myoview: normal. Hb normal.  ECHO with grade 1 diastolic dysfunction, otherwise unremarkable.  CT angio chest no PE Appreciate cardiology input  2-History of SCL cancer, in remission.  Continue follow up with oncology  3. Cholelithiasis: asymptomatic. Incidental findings on CTA chest. Advised patient to seek medical attention if become symptomatic. Patient is to establish care with a pmd. She reported she see Dr mCollene Maresfor colonoscopy.  Code Status: Full code.  Family  Communication: care discussed with patient  Disposition Plan: home   Consultants:  Cardiology  Procedures:  Stress test, unremarkable  Antibiotics: None   Discharge Exam: BP 103/57 mmHg  Pulse 71  Temp(Src) 98.4 F (36.9 C) (Oral)  Resp 15  Ht '5\' 9"'$  (1.753 m)  Wt 77.111 kg (170 lb)  BMI 25.09 kg/m2  SpO2 97%  General: aaox3 Cardiovascular: rrr Respiratory: CTABL  Discharge Instructions You were cared for by a hospitalist during your hospital stay. If you have any questions about your discharge medications or the care you received while you were in the hospital after you are discharged, you can call the unit and asked to speak with the hospitalist on call if the hospitalist that took care of you is not available. Once you are discharged, your primary care physician will handle any further medical issues. Please note that NO REFILLS for any discharge medications will be authorized once you are discharged, as it is imperative that you return to your primary care physician (or establish a relationship with a primary care physician if you do not have one) for your aftercare needs so that they can reassess your need for medications and monitor your lab values.  Discharge Instructions    Diet - low sodium heart healthy    Complete by:  As directed      Increase activity slowly    Complete by:  As directed             Medication List    TAKE these medications  aspirin 81 MG tablet  Take 81 mg by mouth daily.       Allergies  Allergen Reactions  . Promethazine Hcl Other (See Comments)    Pt reports feeling shaky and "sick" after phenergan       Follow-up Information    Follow up with Eilleen Kempf., MD.   Specialty:  Oncology   Contact information:   [place] [address] [phone]       Please follow up.   Contact information:   patient to establish care with a parimary care physician for genera health maintanence and  hospital discharge follow up in 2-3 weeks       The results of significant diagnostics from this hospitalization (including imaging, microbiology, ancillary and laboratory) are listed below for reference.    Significant Diagnostic Studies: Dg Chest 2 View  12/14/2015  CLINICAL DATA:  Mid chest discomfort extending into left arm for the past week, history of lung cancer EXAM: CHEST  2 VIEW COMPARISON:  04/17/2015 FINDINGS: The heart size and vascular pattern are normal. The right lung is clear. In the medial left upper lobe there is an area of irregular opacity and architectural distortion measuring 5.5 cm consistent with radiation fibrosis as seen on 04/17/2015 CT scan. Postsurgical change left apex. No consolidation or effusion. Bony thorax intact. IMPRESSION: Chronic findings with no acute abnormality Electronically Signed   By: Skipper Cliche M.D.   On: 12/14/2015 15:49   Ct Angio Chest Pe W/cm &/or Wo Cm  12/15/2015  CLINICAL DATA:  Chest and left upper arm pain for the past week while walking. History of small cell lung carcinoma. EXAM: CT ANGIOGRAPHY CHEST WITH CONTRAST TECHNIQUE: Multidetector CT imaging of the chest was performed using the standard protocol during bolus administration of intravenous contrast. Multiplanar CT image reconstructions and MIPs were obtained to evaluate the vascular anatomy. CONTRAST:  143m OMNIPAQUE IOHEXOL 350 MG/ML SOLN COMPARISON:  Chest CT dated 04/17/2015. FINDINGS: Mediastinum/Lymph Nodes: No pulmonary emboli or thoracic aortic dissection identified. No masses or pathologically enlarged lymph nodes identified. Lungs/Pleura: No significant change and left hilar and left lower lobe soft tissue density with bronchial encasement without compression. No new areas of consolidation and no enlarged lymph nodes. Mild biapical pleural and parenchymal scarring is unchanged. No lung nodules. Upper abdomen: Multiple small gallstones in the gallbladder measuring up to 5 mm in  maximum diameter each. No gallbladder wall thickening or pericholecystic fluid. Small accessory splenules. Musculoskeletal: Mild thoracic spine degenerative changes and mild scoliosis. Review of the MIP images confirms the above findings. IMPRESSION: 1. No acute abnormality. 2. Stable postradiation changes on the left. 3. Cholelithiasis. Electronically Signed   By: SClaudie ReveringM.D.   On: 12/15/2015 18:59   Nm Myocar Multi W/spect W/wall Motion / Ef  12/15/2015   Blood pressure demonstrated a hypertensive response to exercise.  There was no ST segment deviation noted during stress.  No T wave inversion was noted during stress.  The study is normal.  This is a low risk study.  The left ventricular ejection fraction is hyperdynamic (>65%).     Microbiology: No results found for this or any previous visit (from the past 240 hour(s)).   Labs: Basic Metabolic Panel:  Recent Labs Lab 12/14/15 2105  NA 141  K 4.0  CL 104  CO2 23  GLUCOSE 97  BUN 12  CREATININE 0.84  CALCIUM 9.6   Liver Function Tests:  Recent Labs Lab 12/14/15 2105  AST  15  ALT 15  ALKPHOS 77  BILITOT 0.6  PROT 6.3*  ALBUMIN 3.8    Recent Labs Lab 12/14/15 2105  LIPASE 30   No results for input(s): AMMONIA in the last 168 hours. CBC:  Recent Labs Lab 12/14/15 2105  WBC 6.9  HGB 13.8  HCT 40.8  MCV 88.5  PLT 243   Cardiac Enzymes:  Recent Labs Lab 12/14/15 2355 12/15/15 0443  TROPONINI <0.03  <0.03 <0.03   BNP: BNP (last 3 results) No results for input(s): BNP in the last 8760 hours.  ProBNP (last 3 results) No results for input(s): PROBNP in the last 8760 hours.  CBG: No results for input(s): GLUCAP in the last 168 hours.     SignedFlorencia Reasons MD, PhD  [facility] 12/16/2015, 12:36 PM

## 2015-12-16 NOTE — Progress Notes (Signed)
Pt stated she took a Goody powder for a headache. Pt instructed she could not take any medications unless administered by the nurse. Pt verbalized understanding.  Raliegh Ip RN

## 2015-12-16 NOTE — Progress Notes (Signed)
Complete cardiology consult note was done yesterday. Nuclear stress study was done yesterday. This was normal. Two-dimensional echo has been ordered. It is not yet been done. I called the echo lab and asked them to be sure this is done as the day goes on today. Chest CT showed no evidence of pulmonary emboli.  Daryel November, MD

## 2015-12-16 NOTE — Progress Notes (Signed)
  Echocardiogram 2D Echocardiogram has been performed.  Hailey Hill 12/16/2015, 12:02 PM

## 2015-12-16 NOTE — Progress Notes (Signed)
This admission has been reviewed and determined not to meet inpatient level of care. Both attending Physician and Medical Director are in agreement this should be an Observation encounter according to the Medicare Conditions of Participation as set forth in CFR 42 Chapter 456 482.12 (c) and the Medicare Condition Code-44 Regulations CFR 42 Chapter 100 - 04 50.3. The Patient and/or Patient Representative was notified via delivery of the "MEDICARE OBSERVATION STATUS NOTIFICATION".              

## 2015-12-17 NOTE — Progress Notes (Signed)
D/C teaching including reviewed AVS print out completed by the CN. Assisted to exit by nursing tech via w/c.

## 2015-12-24 NOTE — Telephone Encounter (Signed)
FYI "A week ago I called with chest pain while walknig.  Admitted for three days.  Please let Dr. Julien Nordmann know the dye study, treadmill and more showed no cardiac problems, blood clots and lungs are clear.  Also let him know I have a new PCP Dr. Willette Cluster."  Dr. added to her Care Team.

## 2016-04-25 ENCOUNTER — Ambulatory Visit (HOSPITAL_COMMUNITY)
Admission: RE | Admit: 2016-04-25 | Discharge: 2016-04-25 | Disposition: A | Discharge status: Home / Self Care | Payer: Medicare Other | Source: Ambulatory Visit | Attending: Internal Medicine | Admitting: Internal Medicine

## 2016-04-25 ENCOUNTER — Other Ambulatory Visit (HOSPITAL_BASED_OUTPATIENT_CLINIC_OR_DEPARTMENT_OTHER): Payer: Medicare Other

## 2016-04-25 ENCOUNTER — Encounter (HOSPITAL_COMMUNITY): Payer: Self-pay

## 2016-04-25 ENCOUNTER — Other Ambulatory Visit: Payer: Self-pay | Admitting: *Deleted

## 2016-04-25 DIAGNOSIS — Z85118 Personal history of other malignant neoplasm of bronchus and lung: Secondary | ICD-10-CM

## 2016-04-25 DIAGNOSIS — C3492 Malignant neoplasm of unspecified part of left bronchus or lung: Secondary | ICD-10-CM

## 2016-04-25 DIAGNOSIS — Z923 Personal history of irradiation: Secondary | ICD-10-CM | POA: Diagnosis not present

## 2016-04-25 LAB — COMPREHENSIVE METABOLIC PANEL
ALBUMIN: 3.7 g/dL (ref 3.5–5.0)
ALK PHOS: 72 U/L (ref 40–150)
ALT: 12 U/L (ref 0–55)
ANION GAP: 9 meq/L (ref 3–11)
AST: 12 U/L (ref 5–34)
BILIRUBIN TOTAL: 0.39 mg/dL (ref 0.20–1.20)
BUN: 11.3 mg/dL (ref 7.0–26.0)
CO2: 23 meq/L (ref 22–29)
CREATININE: 0.8 mg/dL (ref 0.6–1.1)
Calcium: 9.3 mg/dL (ref 8.4–10.4)
Chloride: 110 mEq/L — ABNORMAL HIGH (ref 98–109)
EGFR: 74 mL/min/{1.73_m2} — AB (ref >90)
GLUCOSE: 97 mg/dL (ref 70–140)
Potassium: 4.3 mEq/L (ref 3.5–5.1)
Sodium: 142 mEq/L (ref 136–145)
TOTAL PROTEIN: 6.6 g/dL (ref 6.4–8.3)

## 2016-04-25 LAB — CBC WITH DIFFERENTIAL/PLATELET
BASO%: 1 % (ref 0.0–2.0)
Basophils Absolute: 0 10e3/uL (ref 0.0–0.1)
EOS%: 1.9 % (ref 0.0–7.0)
Eosinophils Absolute: 0.1 10e3/uL (ref 0.0–0.5)
HCT: 41.2 % (ref 34.8–46.6)
HGB: 14 g/dL (ref 11.6–15.9)
LYMPH%: 19.7 % (ref 14.0–49.7)
MCH: 29.8 pg (ref 25.1–34.0)
MCHC: 33.9 g/dL (ref 31.5–36.0)
MCV: 87.9 fL (ref 79.5–101.0)
MONO#: 0.2 10e3/uL (ref 0.1–0.9)
MONO%: 5.5 % (ref 0.0–14.0)
NEUT#: 3.2 10e3/uL (ref 1.5–6.5)
NEUT%: 71.9 % (ref 38.4–76.8)
Platelets: 228 10e3/uL (ref 145–400)
RBC: 4.69 10e6/uL (ref 3.70–5.45)
RDW: 13.8 % (ref 11.2–14.5)
WBC: 4.5 10e3/uL (ref 3.9–10.3)
lymph#: 0.9 10e3/uL (ref 0.9–3.3)

## 2016-04-25 MED ORDER — IOPAMIDOL (ISOVUE-300) INJECTION 61%
75.0000 mL | Freq: Once | INTRAVENOUS | Status: AC | PRN
  Administered 2016-04-25: 75 mL via INTRAVENOUS

## 2016-05-04 ENCOUNTER — Telehealth: Payer: Self-pay | Admitting: Internal Medicine

## 2016-05-04 ENCOUNTER — Encounter: Payer: Self-pay | Admitting: Internal Medicine

## 2016-05-04 ENCOUNTER — Ambulatory Visit (HOSPITAL_BASED_OUTPATIENT_CLINIC_OR_DEPARTMENT_OTHER): Payer: Medicare Other | Admitting: Internal Medicine

## 2016-05-04 VITALS — BP 111/74 | HR 76 | Temp 98.5°F | Resp 18 | Ht 69.0 in | Wt 163.3 lb

## 2016-05-04 DIAGNOSIS — C3491 Malignant neoplasm of unspecified part of right bronchus or lung: Secondary | ICD-10-CM

## 2016-05-04 DIAGNOSIS — Z85118 Personal history of other malignant neoplasm of bronchus and lung: Secondary | ICD-10-CM

## 2016-05-04 NOTE — Telephone Encounter (Signed)
Gave and printed appt sched and avs for pt for June 2018

## 2016-05-04 NOTE — Progress Notes (Signed)
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    PRIOR THERAPY:  1) 6 cycles of chemotherapy with cisplatin and VP-16 in combination with Neulasta from 07/14/2010  through 11/02/2010. She received radiation treatments to the chest from 10/04/2010 through 11/19/2010. 2) prophylactic whole- brain radiation 2500 cGy in 10 fractions from 12/07/2010 through 12/20/2010.  CURRENT THERAPY: Observation.  ADVANCE DIRECTIVES: The patient does not have metastatic and is not interested in getting information at this point.  INTERVAL HISTORY: Hailey Hill 65 y.o. female returns to the clinic today for annual followup visit. The patient has been observation for the last 5 years. She was evaluated at the emergency department in February 2017 for left chest pain with radiation to the left arm. There was no concerning cardiac findings but the patient was found to have cholelithiasis. She is feeling fine today with no specific complaints. She denied having any significant chest pain, shortness of breath, cough or hemoptysis. She denied having any weight loss or night sweats. She has no nausea or vomiting. She continues to have persistent peripheral neuropathy. She had repeat CT scan of the chest performed recently and she is here for evaluation and discussion of her scan results.  MEDICAL HISTORY: Past Medical History  Diagnosis Date  . SVC syndrome   . Dyslipidemia   . Peripheral neuropathy ([place])   . Thrombus 02/40/9735    Left basilic vein,  Left subclavian vein  . Lung cancer  ([place]) dx'd 07/2010    ALLERGIES:  is allergic to promethazine hcl.  MEDICATIONS:  Current Outpatient Prescriptions  Medication Sig Dispense Refill  . acetaminophen (TYLENOL) 500 MG tablet Take 500 mg by mouth every 6 (six) hours as needed.    Marland Kitchen aspirin 81 MG tablet Take 81 mg by mouth daily.       No current facility-administered medications for this visit.    REVIEW OF SYSTEMS:  A comprehensive review of systems was negative.   PHYSICAL EXAMINATION: General appearance: alert, cooperative and no distress Head: Normocephalic, without obvious abnormality, atraumatic Neck: no adenopathy, no JVD, supple, symmetrical, trachea midline and thyroid not enlarged, symmetric, no tenderness/mass/nodules Lymph nodes: Cervical, supraclavicular, and axillary nodes normal. Resp: clear to auscultation bilaterally Back: symmetric, no curvature. ROM normal. No CVA tenderness. Cardio: regular rate and rhythm, S1, S2 normal, no murmur, click, rub or gallop GI: soft, non-tender; bowel sounds normal; no masses,  no organomegaly Extremities: extremities normal, atraumatic, no cyanosis or edema Neurologic: Alert and oriented X 3, normal strength and tone. Normal symmetric reflexes. Normal coordination and gait  ECOG PERFORMANCE STATUS: 0 - Asymptomatic  Blood pressure 111/74, pulse 76, temperature 98.5 F (36.9 C), temperature source Oral, resp. rate 18, height '5\' 9"'$  (1.753 m), weight 163 lb 4.8 oz (74.072 kg), SpO2 100 %.  LABORATORY DATA: Lab Results  Component Value Date   WBC 4.5 04/25/2016   HGB 14.0 04/25/2016   HCT 41.2 04/25/2016   MCV 87.9 04/25/2016   PLT 228 04/25/2016      Chemistry      Component Value Date/Time   NA 142  04/25/2016 0821   NA 141 12/14/2015 2105   K 4.3 04/25/2016 0821   K 4.0 12/14/2015 2105   CL 104 12/14/2015 2105   CL 108* 01/09/2013 0913   CO2 23 04/25/2016 0821   CO2 23 12/14/2015 2105   BUN 11.3 04/25/2016 0821   BUN 12 12/14/2015 2105   CREATININE 0.8  04/25/2016 0821   CREATININE 0.84 12/14/2015 2105      Component Value Date/Time   CALCIUM 9.3 04/25/2016 0821   CALCIUM 9.6 12/14/2015 2105   ALKPHOS 72 04/25/2016 0821   ALKPHOS 77 12/14/2015 2105   AST 12 04/25/2016 0821   AST 15 12/14/2015 2105   ALT 12 04/25/2016 0821   ALT 15 12/14/2015 2105   BILITOT 0.39 04/25/2016 0821   BILITOT 0.6 12/14/2015 2105       RADIOGRAPHIC STUDIES: Ct Chest W Contrast  04/25/2016  CLINICAL DATA:  Followup left lung small cell carcinoma. Previous radiation therapy and chemotherapy. EXAM: CT CHEST WITH CONTRAST TECHNIQUE: Multidetector CT imaging of the chest was performed during intravenous contrast administration. CONTRAST:  55m ISOVUE-300 IOPAMIDOL (ISOVUE-300) INJECTION 61% COMPARISON:  12/15/2015 and 04/17/2015 FINDINGS: Mediastinum/Lymph Nodes: No masses, pathologically enlarged lymph nodes, or other significant abnormality. Lungs/Pleura: Left paramediastinal radiation changes in the left upper and lower lobes appears stable. 3 mm nodular density in the right middle lobe on image 84/series 5 and 4 mm subpleural nodular density in left lower lobe on image 101/series 5 remain stable. No new or enlarging pulmonary nodules or masses are identified. There is no evidence of pulmonary infiltrate or pleural effusion. Upper abdomen: Both adrenal glands are normal in appearance. Tiny benign-appearing hepatic and splenic cysts remain stable. Musculoskeletal: No chest wall mass or suspicious bone lesions identified. Stable mild thoracic dextroscoliosis. IMPRESSION: Stable left lung post radiation changes. No evidence of recurrent or metastatic carcinoma within the thorax. Electronically Signed   By: JEarle GellM.D.   On: 04/25/2016 10:31    ASSESSMENT AND PLAN: This is a very pleasant 65years old white female with limited stage small cell lung cancer diagnosed in September of 2011, status post systemic chemotherapy with cisplatin and etoposide for 6 cycles  concurrent with radiation followed by prophylactic irradiation. She has been observation for more than 5 years with no evidence for disease recurrence. The patient has no complaints today. The recent CT scan of the chest showed no evidence for disease recurrence. I discussed the scan results with the patient today and recommended for her to continue on observation with repeat CT scan of the chest in one year. She was advised to call immediately if she has any concerning symptoms in the interval. The patient voices understanding of current disease status and treatment options and is in agreement with the current care plan.  All questions were answered. The patient knows to call the clinic with any problems, questions or concerns. We can certainly see the patient much sooner if necessary.  Disclaimer: This note was dictated with voice recognition software. Similar sounding words can inadvertently be transcribed and may not be corrected upon review.

## 2016-05-18 ENCOUNTER — Other Ambulatory Visit: Payer: Self-pay | Admitting: Gastroenterology

## 2016-05-18 DIAGNOSIS — R110 Nausea: Secondary | ICD-10-CM

## 2016-05-18 DIAGNOSIS — R1011 Right upper quadrant pain: Secondary | ICD-10-CM

## 2016-06-02 ENCOUNTER — Ambulatory Visit
Admission: RE | Admit: 2016-06-02 | Discharge: 2016-06-02 | Disposition: A | Discharge status: Home / Self Care | Payer: Medicare Other | Source: Ambulatory Visit | Attending: Gastroenterology | Admitting: Gastroenterology

## 2016-06-02 DIAGNOSIS — R1011 Right upper quadrant pain: Secondary | ICD-10-CM

## 2016-06-02 DIAGNOSIS — R110 Nausea: Secondary | ICD-10-CM

## 2016-06-03 ENCOUNTER — Other Ambulatory Visit: Payer: Self-pay | Admitting: Surgery

## 2016-07-20 ENCOUNTER — Encounter (HOSPITAL_BASED_OUTPATIENT_CLINIC_OR_DEPARTMENT_OTHER): Payer: Self-pay | Admitting: *Deleted

## 2016-07-24 NOTE — H&P (Signed)
Hailey Hill. Hailey Hill  Location: [facility] Patient #: [id number] DOB: [birth year] Married / Language: English / Race: White Female   History of Present Illness (Janit Cutter A. Ninfa Linden MD;  The patient is a 65 year old female who presents for evaluation of gall stones. This patient is referred to me by Dr. Juanita Craver for symptomatic cholelithiasis. She has had several attacks of epigastric abdominal pain with pain to the right upper quadrant and back. She's had nausea and bloating with this. Her first attack was late last year. Her second was in February and she got a cardiac workup for this. She is finally had an ultrasound showing gallstones. She has a history of small cell lung cancer which was treated with radiation and chemotherapy. She has been disease-free for 5 years. She currently has no pulmonary issues and walks daily. She walks 3 miles this morning prior to the office visit. Her discomfort is worse with fatty meals. The pain is described as sharp.   Other Problems  Lung Cancer  Past Surgical History Tonsillectomy  Diagnostic Studies History Elbert Ewings, CMA;  Colonoscopy within last year Mammogram 1-3 years ago Pap Smear 1-5 years ago  Allergies Elbert Ewings, [place];  Phenergan *ANTIHISTAMINES*  Medication History Elbert Ewings, CMA;   No Current Medications Medications Reconciled  Social History Elbert Ewings, [place]; Alcohol use Occasional alcohol use. Caffeine use Coffee. No drug use Tobacco use Former smoker.  Family History Elbert Ewings, [place];  Alcohol Abuse Brother, Mother. Heart Disease Father. Melanoma Son.  Pregnancy / Birth History Elbert Ewings, [place]; Age at menarche 32 years. Age of menopause 38-55 Gravida 1 Length (months) of breastfeeding 3-6 Maternal age 62-30 Para 1    Review of Systems Elbert Ewings CMA;  General Not Present- Appetite Loss, Chills, Fatigue, Fever, Night Sweats, Weight Gain and Weight Loss. Skin  Not Present- Change in Wart/Mole, Dryness, Hives, Jaundice, New Lesions, Non-Healing Wounds, Rash and Ulcer. HEENT Not Present- Earache, Hearing Loss, Hoarseness, Nose Bleed, Oral Ulcers, Ringing in the Ears, Seasonal Allergies, Sinus Pain, Sore Throat, Visual Disturbances, Wears glasses/contact lenses and Yellow Eyes. Respiratory Not Present- Bloody sputum, Chronic Cough, Difficulty Breathing, Snoring and Wheezing. Breast Not Present- Breast Mass, Breast Pain, Nipple Discharge and Skin Changes. Cardiovascular Present- Chest Pain. Not Present- Difficulty Breathing Lying Down, Leg Cramps, Palpitations, Rapid Heart Rate, Shortness of Breath and Swelling of Extremities. Gastrointestinal Present- Bloating and Nausea. Not Present- Abdominal Pain, Bloody Stool, Change in Bowel Habits, Chronic diarrhea, Constipation, Difficulty Swallowing, Excessive gas, Gets full quickly at meals, Hemorrhoids, Indigestion, Rectal Pain and Vomiting. Female Genitourinary Not Present- Frequency, Nocturia, Painful Urination, Pelvic Pain and Urgency. Musculoskeletal Not Present- Back Pain, Joint Pain, Joint Stiffness, Muscle Pain, Muscle Weakness and Swelling of Extremities. Neurological Not Present- Decreased Memory, Fainting, Headaches, Numbness, Seizures, Tingling, Tremor, Trouble walking and Weakness. Psychiatric Not Present- Anxiety, Bipolar, Change in Sleep Pattern, Depression, Fearful and Frequent crying. Endocrine Not Present- Cold Intolerance, Excessive Hunger, Hair Changes, Heat Intolerance, Hot flashes and New Diabetes. Hematology Not Present- Blood Thinners, Easy Bruising, Excessive bleeding, Gland problems, HIV and Persistent Infections.  Vitals (   Weight: 161 lb Height: 69in Body Surface Area: 1.88 m Body Mass Index: 23.78 kg/m  Temp.: 97.40F  Pulse: 71 (Regular)  BP: 124/78 (Sitting, Left Arm, Standard    Physical Exam   General Mental Status-Alert. General Appearance-Consistent with  stated age. Hydration-Well hydrated. Voice-Normal.  Head and Neck Head-normocephalic, atraumatic with no lesions or palpable masses. Trachea-midline. Thyroid Gland Characteristics - normal  size and consistency.  Eye Eyeball - Bilateral-Extraocular movements intact. Sclera/Conjunctiva - Bilateral-No scleral icterus.  Chest and Lung Exam Chest and lung exam reveals -quiet, even and easy respiratory effort with no use of accessory muscles and on auscultation, normal breath sounds, no adventitious sounds and normal vocal resonance. Inspection Chest Wall - Normal. Back - normal.  Breast - Did not examine.  Cardiovascular Cardiovascular examination reveals -normal heart sounds, regular rate and rhythm with no murmurs and normal pedal pulses bilaterally.  Abdomen Inspection Inspection of the abdomen reveals - No Hernias. Skin - Scar - no surgical scars. Palpation/Percussion Palpation and Percussion of the abdomen reveal - Soft, Non Tender, No Rebound tenderness, No Rigidity (guarding) and No hepatosplenomegaly. Auscultation Auscultation of the abdomen reveals - Bowel sounds normal.  Neurologic - Did not examine.  Musculoskeletal Normal Exam - Left-Upper Extremity Strength Normal and Lower Extremity Strength Normal. Normal Exam - Right-Upper Extremity Strength Normal and Lower Extremity Strength Normal.  Lymphatic - Did not examine.    Assessment & Plan   SYMPTOMATIC CHOLELITHIASIS (K80.20)  Impression: I have reviewed her ultrasound and laboratory data. She has multiple gallstones. The bile duct is normal. Her liver function tests are also normal. Laparoscopic cholecystectomy is recommended. I gave her literature regarding this. I discussed the surgery in detail. I discussed the risk of surgery which includes but is not limited to bleeding, infection, injury to surrounding structures, inverted to an open procedure, cardiopulmonary issues, postoperative  recovery, etc. She understands and wished to proceed with surgery Current Plans Pt Education - Pamphlet Given - Laparoscopic Gallbladder Surgery: discussed with patient and provided information.

## 2016-07-25 ENCOUNTER — Ambulatory Visit (HOSPITAL_BASED_OUTPATIENT_CLINIC_OR_DEPARTMENT_OTHER): Payer: Medicare Other | Admitting: Certified Registered"

## 2016-07-25 ENCOUNTER — Encounter (HOSPITAL_BASED_OUTPATIENT_CLINIC_OR_DEPARTMENT_OTHER)
Admission: RE | Disposition: A | Discharge status: Home / Self Care | Payer: Self-pay | Source: Ambulatory Visit | Attending: Surgery

## 2016-07-25 ENCOUNTER — Encounter (HOSPITAL_BASED_OUTPATIENT_CLINIC_OR_DEPARTMENT_OTHER): Payer: Self-pay

## 2016-07-25 ENCOUNTER — Ambulatory Visit (HOSPITAL_BASED_OUTPATIENT_CLINIC_OR_DEPARTMENT_OTHER)
Admission: RE | Admit: 2016-07-25 | Discharge: 2016-07-25 | Disposition: A | Discharge status: Home / Self Care | Payer: Medicare Other | Source: Ambulatory Visit | Attending: Surgery | Admitting: Surgery

## 2016-07-25 DIAGNOSIS — Z87891 Personal history of nicotine dependence: Secondary | ICD-10-CM | POA: Diagnosis not present

## 2016-07-25 DIAGNOSIS — Z923 Personal history of irradiation: Secondary | ICD-10-CM | POA: Insufficient documentation

## 2016-07-25 DIAGNOSIS — Z85118 Personal history of other malignant neoplasm of bronchus and lung: Secondary | ICD-10-CM | POA: Insufficient documentation

## 2016-07-25 DIAGNOSIS — K8010 Calculus of gallbladder with chronic cholecystitis without obstruction: Secondary | ICD-10-CM | POA: Diagnosis present

## 2016-07-25 HISTORY — DX: Other complications of anesthesia, initial encounter: T88.59XA

## 2016-07-25 HISTORY — DX: Adverse effect of unspecified anesthetic, initial encounter: T41.45XA

## 2016-07-25 HISTORY — PX: CHOLECYSTECTOMY: SHX55

## 2016-07-25 SURGERY — LAPAROSCOPIC CHOLECYSTECTOMY
Anesthesia: General | Site: Abdomen

## 2016-07-25 MED ORDER — LIDOCAINE HCL (CARDIAC) 20 MG/ML IV SOLN
INTRAVENOUS | Status: DC | PRN
  Administered 2016-07-25: 60 mg via INTRAVENOUS

## 2016-07-25 MED ORDER — CHLORHEXIDINE GLUCONATE CLOTH 2 % EX PADS: 6.0000 | MEDICATED_PAD | Freq: Once | CUTANEOUS | Status: DC

## 2016-07-25 MED ORDER — DEXAMETHASONE SODIUM PHOSPHATE 4 MG/ML IJ SOLN
INTRAMUSCULAR | Status: DC | PRN
  Administered 2016-07-25: 10 mg via INTRAVENOUS

## 2016-07-25 MED ORDER — FENTANYL CITRATE (PF) 100 MCG/2ML IJ SOLN
50.0000 ug | INTRAMUSCULAR | Status: DC | PRN
  Administered 2016-07-25 (×2): 50 ug via INTRAVENOUS

## 2016-07-25 MED ORDER — LACTATED RINGERS IV SOLN
INTRAVENOUS | Status: DC
  Administered 2016-07-25 (×2): via INTRAVENOUS

## 2016-07-25 MED ORDER — CEFAZOLIN SODIUM-DEXTROSE 2-4 GM/100ML-% IV SOLN
INTRAVENOUS | Status: AC
  Filled 2016-07-25: qty 100

## 2016-07-25 MED ORDER — DEXAMETHASONE SODIUM PHOSPHATE 10 MG/ML IJ SOLN
INTRAMUSCULAR | Status: AC
  Filled 2016-07-25: qty 1

## 2016-07-25 MED ORDER — LIDOCAINE 2% (20 MG/ML) 5 ML SYRINGE
INTRAMUSCULAR | Status: AC
  Filled 2016-07-25: qty 5

## 2016-07-25 MED ORDER — ONDANSETRON HCL 4 MG/2ML IJ SOLN
INTRAMUSCULAR | Status: DC | PRN
  Administered 2016-07-25: 4 mg via INTRAVENOUS

## 2016-07-25 MED ORDER — ONDANSETRON HCL 4 MG/2ML IJ SOLN
4.0000 mg | Freq: Once | INTRAMUSCULAR | Status: AC | PRN
  Administered 2016-07-25: 4 mg via INTRAVENOUS

## 2016-07-25 MED ORDER — SCOPOLAMINE 1 MG/3DAYS TD PT72: 1.0000 | MEDICATED_PATCH | Freq: Once | TRANSDERMAL | Status: DC | PRN

## 2016-07-25 MED ORDER — BUPIVACAINE-EPINEPHRINE (PF) 0.5% -1:200000 IJ SOLN
INTRAMUSCULAR | Status: AC
  Filled 2016-07-25: qty 30

## 2016-07-25 MED ORDER — ROCURONIUM BROMIDE 10 MG/ML (PF) SYRINGE
PREFILLED_SYRINGE | INTRAVENOUS | Status: AC
  Filled 2016-07-25: qty 10

## 2016-07-25 MED ORDER — GLYCOPYRROLATE 0.2 MG/ML IJ SOLN: 0.2000 mg | Freq: Once | INTRAMUSCULAR | Status: DC | PRN

## 2016-07-25 MED ORDER — MIDAZOLAM HCL 2 MG/2ML IJ SOLN
1.0000 mg | INTRAMUSCULAR | Status: DC | PRN
  Administered 2016-07-25: 1 mg via INTRAVENOUS

## 2016-07-25 MED ORDER — SODIUM CHLORIDE 0.9 % IR SOLN
Status: DC | PRN
  Administered 2016-07-25: 1000 mL

## 2016-07-25 MED ORDER — SUGAMMADEX SODIUM 200 MG/2ML IV SOLN
INTRAVENOUS | Status: DC | PRN
  Administered 2016-07-25: 300 mg via INTRAVENOUS

## 2016-07-25 MED ORDER — OXYCODONE HCL 5 MG/5ML PO SOLN: 5.0000 mg | Freq: Once | ORAL | Status: DC | PRN

## 2016-07-25 MED ORDER — ONDANSETRON HCL 4 MG/2ML IJ SOLN
INTRAMUSCULAR | Status: AC
  Filled 2016-07-25: qty 2

## 2016-07-25 MED ORDER — HYDROMORPHONE HCL 1 MG/ML IJ SOLN
0.2500 mg | INTRAMUSCULAR | Status: DC | PRN
  Administered 2016-07-25: 0.5 mg via INTRAVENOUS

## 2016-07-25 MED ORDER — CEFAZOLIN SODIUM-DEXTROSE 2-4 GM/100ML-% IV SOLN
2.0000 g | INTRAVENOUS | Status: AC
  Administered 2016-07-25: 2 g via INTRAVENOUS

## 2016-07-25 MED ORDER — MIDAZOLAM HCL 2 MG/2ML IJ SOLN
INTRAMUSCULAR | Status: AC
  Filled 2016-07-25: qty 2

## 2016-07-25 MED ORDER — HYDROMORPHONE HCL 1 MG/ML IJ SOLN
INTRAMUSCULAR | Status: AC
  Filled 2016-07-25: qty 1

## 2016-07-25 MED ORDER — ROCURONIUM BROMIDE 100 MG/10ML IV SOLN
INTRAVENOUS | Status: DC | PRN
  Administered 2016-07-25: 50 mg via INTRAVENOUS

## 2016-07-25 MED ORDER — BUPIVACAINE-EPINEPHRINE (PF) 0.5% -1:200000 IJ SOLN
INTRAMUSCULAR | Status: DC | PRN
Start: 2016-07-25 — End: 2016-07-25
  Administered 2016-07-25: 20 mL via PERINEURAL

## 2016-07-25 MED ORDER — KETOROLAC TROMETHAMINE 15 MG/ML IJ SOLN
INTRAMUSCULAR | Status: DC | PRN
  Administered 2016-07-25: 30 mg via INTRAVENOUS

## 2016-07-25 MED ORDER — FENTANYL CITRATE (PF) 100 MCG/2ML IJ SOLN
INTRAMUSCULAR | Status: AC
  Filled 2016-07-25: qty 2

## 2016-07-25 MED ORDER — MEPERIDINE HCL 25 MG/ML IJ SOLN: 6.2500 mg | INTRAMUSCULAR | Status: DC | PRN

## 2016-07-25 MED ORDER — PROPOFOL 500 MG/50ML IV EMUL
INTRAVENOUS | Status: AC
Start: 2016-07-25 — End: 2016-07-25
  Filled 2016-07-25: qty 50

## 2016-07-25 MED ORDER — IOPAMIDOL (ISOVUE-300) INJECTION 61%
INTRAVENOUS | Status: AC
  Filled 2016-07-25: qty 50

## 2016-07-25 MED ORDER — HYDROCODONE-ACETAMINOPHEN 5-325 MG PO TABS: 1.0000 | ORAL_TABLET | ORAL | 0 refills | Status: DC | PRN

## 2016-07-25 MED ORDER — PROPOFOL 10 MG/ML IV BOLUS
INTRAVENOUS | Status: DC | PRN
  Administered 2016-07-25: 150 mg via INTRAVENOUS

## 2016-07-25 MED ORDER — OXYCODONE HCL 5 MG PO TABS: 5.0000 mg | ORAL_TABLET | Freq: Once | ORAL | Status: DC | PRN

## 2016-07-25 SURGICAL SUPPLY — 33 items
APPLIER CLIP 5 13 M/L LIGAMAX5 (MISCELLANEOUS) ×3
APR CLP MED LRG 5 ANG JAW (MISCELLANEOUS) ×1
BAG SPEC RTRVL LRG 6X4 10 (ENDOMECHANICALS) ×1
BLADE CLIPPER SURG (BLADE) IMPLANT
CHLORAPREP W/TINT 26ML (MISCELLANEOUS) ×3 IMPLANT
CLIP APPLIE 5 13 M/L LIGAMAX5 (MISCELLANEOUS) ×1 IMPLANT
COVER MAYO STAND STRL (DRAPES) IMPLANT
DECANTER SPIKE VIAL GLASS SM (MISCELLANEOUS) ×3 IMPLANT
DRAPE C-ARM 42X72 X-RAY (DRAPES) IMPLANT
ELECT REM PT RETURN 9FT ADLT (ELECTROSURGICAL) ×3
ELECTRODE REM PT RTRN 9FT ADLT (ELECTROSURGICAL) ×1 IMPLANT
FILTER SMOKE EVAC LAPAROSHD (FILTER) IMPLANT
GLOVE SURG SIGNA 7.5 PF LTX (GLOVE) ×3 IMPLANT
GOWN STRL REUS W/ TWL LRG LVL3 (GOWN DISPOSABLE) ×3 IMPLANT
GOWN STRL REUS W/TWL LRG LVL3 (GOWN DISPOSABLE) ×9
LIQUID BAND (GAUZE/BANDAGES/DRESSINGS) ×4 IMPLANT
NS IRRIG 1000ML POUR BTL (IV SOLUTION) ×3 IMPLANT
PACK BASIN DAY SURGERY FS (CUSTOM PROCEDURE TRAY) ×3 IMPLANT
POUCH SPECIMEN RETRIEVAL 10MM (ENDOMECHANICALS) ×2 IMPLANT
SCISSORS LAP 5X35 DISP (ENDOMECHANICALS) ×2 IMPLANT
SET CHOLANGIOGRAPH 5 50 .035 (SET/KITS/TRAYS/PACK) IMPLANT
SET IRRIG TUBING LAPAROSCOPIC (IRRIGATION / IRRIGATOR) ×3 IMPLANT
SLEEVE ENDOPATH XCEL 5M (ENDOMECHANICALS) ×6 IMPLANT
SLEEVE SCD COMPRESS KNEE MED (MISCELLANEOUS) ×3 IMPLANT
SPECIMEN JAR SMALL (MISCELLANEOUS) ×3 IMPLANT
SUT MON AB 4-0 PC3 18 (SUTURE) ×3 IMPLANT
TOWEL OR 17X24 6PK STRL BLUE (TOWEL DISPOSABLE) ×3 IMPLANT
TRAY LAPAROSCOPIC (CUSTOM PROCEDURE TRAY) ×3 IMPLANT
TROCAR XCEL BLUNT TIP 100MML (ENDOMECHANICALS) ×3 IMPLANT
TROCAR XCEL NON-BLD 5MMX100MML (ENDOMECHANICALS) ×3 IMPLANT
TUBE CONNECTING 20'X1/4 (TUBING) ×1
TUBE CONNECTING 20X1/4 (TUBING) ×2 IMPLANT
TUBING INSUFFLATION (TUBING) ×3 IMPLANT

## 2016-07-25 NOTE — Op Note (Signed)

## 2016-07-25 NOTE — Discharge Instructions (Signed)
[facility] ______[facility] LAPAROSCOPIC SURGERY: POST OP INSTRUCTIONS Always review your discharge instruction sheet given to you by the facility where your surgery was performed. IF YOU HAVE DISABILITY OR FAMILY LEAVE FORMS, YOU MUST BRING THEM TO THE OFFICE FOR PROCESSING.   DO NOT GIVE THEM TO YOUR DOCTOR.  1. A prescription for pain medication may be given to you upon discharge.  Take your pain medication as prescribed, if needed.  If narcotic pain medicine is not needed, then you may take acetaminophen (Tylenol) or ibuprofen (Advil) as needed. 2. Take your usually prescribed medications unless otherwise directed. 3. If you need a refill on your pain medication, please contact your pharmacy.  They will contact our office to request authorization. Prescriptions will not be filled after 5pm or on week-ends. 4. You should follow a light diet the first few days after arrival home, such as soup and crackers, etc.  Be sure to include lots of fluids daily. 5. Most patients will experience some swelling and bruising in the area of the incisions.  Ice packs will help.  Swelling and bruising can take several days to resolve.  6. It is common to experience some constipation if taking pain medication after surgery.  Increasing fluid intake and taking a stool softener (such as Colace) will usually help or prevent this problem from occurring.  A mild laxative (Milk of Magnesia or Miralax) should be taken according to package instructions if there are no bowel movements after 48 hours. 7. Unless discharge instructions indicate otherwise, you may remove your bandages 24-48 hours after surgery, and you may shower at that time.  You may have steri-strips (small skin tapes) in place directly over the incision.  These strips should be left on the skin for 7-10 days.  If your surgeon used skin glue on the incision, you may shower in 24 hours.  The glue will flake off over the next 2-3 weeks.  Any sutures or  staples will be removed at the office during your follow-up visit. 8. ACTIVITIES:  You may resume regular (light) daily activities beginning the next day--such as daily self-care, walking, climbing stairs--gradually increasing activities as tolerated.  You may have sexual intercourse when it is comfortable.  Refrain from any heavy lifting or straining until approved by your doctor. a. You may drive when you are no longer taking prescription pain medication, you can comfortably wear a seatbelt, and you can safely maneuver your car and apply brakes. b. RETURN TO WORK:  __________________________________________________________ 9. You should see your doctor in the office for a follow-up appointment approximately 2-3 weeks after your surgery.  Make sure that you call for this appointment within a day or two after you arrive home to insure a convenient appointment time. 10. OTHER INSTRUCTIONS:OK TO SHOWER TOMORROW 11. NO LIFTING MORE THAN 15 POUNDS FOR 2 WEEKS. 12. ICE PACK, IBUPROFEN ALSO FOR PAIN __________________________________________________________________________________________________________________________ __________________________________________________________________________________________________________________________ WHEN TO CALL YOUR DOCTOR: 1. Fever over 101.0 2. Inability to urinate 3. Continued bleeding from incision. 4. Increased pain, redness, or drainage from the incision. 5. Increasing abdominal pain  The clinic staff is available to answer your questions during regular business hours.  Please dont hesitate to call and ask to speak to one of the nurses for clinical concerns.  If you have a medical emergency, go to the nearest emergency room or call 911.  A surgeon from [facility] is always on call at the hospital. [address] [place], [place], [place]  [address] ? [address] [place], [place], [place]   [phone] ? [phone] ? FAX [phone]  [phone] Web site: [email or web address]    Post Anesthesia Home Care Instructions  Activity: Get plenty of rest for the remainder of the day. A responsible adult should stay with you for 24 hours following the procedure.  For the next 24 hours, DO NOT: -Drive a car -Paediatric nurse -Drink alcoholic beverages -Take any medication unless instructed by your physician -Make any legal decisions or sign important papers.  Meals: Start with liquid foods such as gelatin or soup. Progress to regular foods as tolerated. Avoid greasy, spicy, heavy foods. If nausea and/or vomiting occur, drink only clear liquids until the nausea and/or vomiting subsides. Call your physician if vomiting continues.  Special Instructions/Symptoms: Your throat may feel dry or sore from the anesthesia or the breathing tube placed in your throat during surgery. If this causes discomfort, gargle with warm salt water. The discomfort should disappear within 24 hours.  If you had a scopolamine patch placed behind your ear for the management of post- operative nausea and/or vomiting:  1. The medication in the patch is effective for 72 hours, after which it should be removed.  Wrap patch in a tissue and discard in the trash. Wash hands thoroughly with soap and water. 2. You may remove the patch earlier than 72 hours if you experience unpleasant side effects which may include dry mouth, dizziness or visual disturbances. 3. Avoid touching the patch. Wash your hands with soap and water after contact with the patch.

## 2016-07-25 NOTE — Anesthesia Preprocedure Evaluation (Addendum)
Anesthesia Evaluation  Patient identified by MRN, date of birth, ID band Patient awake    Reviewed: Allergy & Precautions, NPO status , Patient's Chart, lab work & pertinent test results  Airway Mallampati: I  TM Distance: >3 FB Neck ROM: Full    Dental  (+) Teeth Intact, Dental Advisory Given   Pulmonary former smoker,  Small cell lung cancer   breath sounds clear to auscultation       Cardiovascular  Rhythm:Regular Rate:Normal     Neuro/Psych  Neuromuscular disease    GI/Hepatic   Endo/Other    Renal/GU      Musculoskeletal   Abdominal   Peds  Hematology   Anesthesia Other Findings   Reproductive/Obstetrics                            Anesthesia Physical Anesthesia Plan  ASA: II  Anesthesia Plan: General   Post-op Pain Management:    Induction: Intravenous  Airway Management Planned: Oral ETT  Additional Equipment:   Intra-op Plan:   Post-operative Plan: Extubation in OR  Informed Consent: I have reviewed the patients History and Physical, chart, labs and discussed the procedure including the risks, benefits and alternatives for the proposed anesthesia with the patient or authorized representative who has indicated his/her understanding and acceptance.   Dental advisory given  Plan Discussed with: CRNA, Anesthesiologist and Surgeon  Anesthesia Plan Comments:         Anesthesia Quick Evaluation

## 2016-07-25 NOTE — Transfer of Care (Signed)
Immediate Anesthesia Transfer of Care Note  Patient: Hailey Hill  Procedure(s) Performed: Procedure(s): LAPAROSCOPIC CHOLECYSTECTOMY (N/A)  Patient Location: PACU  Anesthesia Type:General  Level of Consciousness: awake, alert , oriented and patient cooperative  Airway & Oxygen Therapy: Patient Spontanous Breathing and Patient connected to face mask oxygen  Post-op Assessment: Report given to RN and Post -op Vital signs reviewed and stable  Post vital signs: Reviewed and stable  Last Vitals:  Vitals:   07/25/16 0807 07/25/16 0808  BP:    Pulse: 88 88  Resp:  14  Temp:      Last Pain:  Vitals:   07/25/16 0639  TempSrc: Oral         Complications: No apparent anesthesia complications

## 2016-07-25 NOTE — Anesthesia Procedure Notes (Signed)
Procedure Name: Intubation Date/Time: 07/25/2016 7:25 AM Performed by: Jailey Booton D Pre-anesthesia Checklist: Patient identified, Emergency Drugs available, Suction available and Patient being monitored Patient Re-evaluated:Patient Re-evaluated prior to inductionOxygen Delivery Method: Circle system utilized Preoxygenation: Pre-oxygenation with 100% oxygen Intubation Type: IV induction Ventilation: Mask ventilation without difficulty Laryngoscope Size: Mac and 3 Grade View: Grade II Tube type: Oral Tube size: 7.0 mm Number of attempts: 1 Airway Equipment and Method: Stylet and Oral airway Placement Confirmation: ETT inserted through vocal cords under direct vision,  positive ETCO2 and breath sounds checked- equal and bilateral Secured at: 21 cm Tube secured with: Tape Dental Injury: Teeth and Oropharynx as per pre-operative assessment

## 2016-07-25 NOTE — Interval H&P Note (Signed)
History and Physical Interval Note: no change in H and P  07/25/2016 7:07 AM  Hailey Hill  has presented today for surgery, with the diagnosis of Symptomatic gallstones  The various methods of treatment have been discussed with the patient and family. After consideration of risks, benefits and other options for treatment, the patient has consented to  Procedure(s): LAPAROSCOPIC CHOLECYSTECTOMY (N/A) as a surgical intervention .  The patient's history has been reviewed, patient examined, no change in status, stable for surgery.  I have reviewed the patient's chart and labs.  Questions were answered to the patient's satisfaction.     [name]

## 2016-07-25 NOTE — Anesthesia Postprocedure Evaluation (Signed)
Anesthesia Post Note  Patient: Hailey Hill  Procedure(s) Performed: Procedure(s) (LRB): LAPAROSCOPIC CHOLECYSTECTOMY (N/A)  Patient location during evaluation: PACU Anesthesia Type: General Level of consciousness: awake and alert Pain management: pain level controlled Vital Signs Assessment: post-procedure vital signs reviewed and stable Respiratory status: spontaneous breathing, nonlabored ventilation and respiratory function stable Cardiovascular status: blood pressure returned to baseline and stable Postop Assessment: no signs of nausea or vomiting Anesthetic complications: no    Last Vitals:  Vitals:   07/25/16 0830 07/25/16 0845  BP:    Pulse: 86 84  Resp: 17 19  Temp:      Last Pain:  Vitals:   07/25/16 0900  TempSrc:   PainSc: 0-No pain                 Shalawn Wynder A

## 2016-07-26 ENCOUNTER — Encounter (HOSPITAL_BASED_OUTPATIENT_CLINIC_OR_DEPARTMENT_OTHER): Payer: Self-pay | Admitting: Surgery

## 2017-04-25 ENCOUNTER — Ambulatory Visit (HOSPITAL_COMMUNITY)
Admission: RE | Admit: 2017-04-25 | Discharge: 2017-04-25 | Disposition: A | Discharge status: Home / Self Care | Payer: Medicare HMO | Source: Ambulatory Visit | Attending: Internal Medicine | Admitting: Internal Medicine

## 2017-04-25 ENCOUNTER — Other Ambulatory Visit (HOSPITAL_BASED_OUTPATIENT_CLINIC_OR_DEPARTMENT_OTHER): Payer: Medicare HMO

## 2017-04-25 DIAGNOSIS — I700 Atherosclerosis of aorta: Secondary | ICD-10-CM | POA: Diagnosis not present

## 2017-04-25 DIAGNOSIS — Z923 Personal history of irradiation: Secondary | ICD-10-CM | POA: Insufficient documentation

## 2017-04-25 DIAGNOSIS — C3491 Malignant neoplasm of unspecified part of right bronchus or lung: Secondary | ICD-10-CM

## 2017-04-25 DIAGNOSIS — I2510 Atherosclerotic heart disease of native coronary artery without angina pectoris: Secondary | ICD-10-CM | POA: Insufficient documentation

## 2017-04-25 DIAGNOSIS — Z9889 Other specified postprocedural states: Secondary | ICD-10-CM | POA: Insufficient documentation

## 2017-04-25 DIAGNOSIS — Z85118 Personal history of other malignant neoplasm of bronchus and lung: Secondary | ICD-10-CM | POA: Insufficient documentation

## 2017-04-25 LAB — CBC WITH DIFFERENTIAL/PLATELET
BASO%: 0.8 % (ref 0.0–2.0)
Basophils Absolute: 0 10*3/uL (ref 0.0–0.1)
EOS%: 1.6 % (ref 0.0–7.0)
Eosinophils Absolute: 0.1 10*3/uL (ref 0.0–0.5)
HEMATOCRIT: 40.4 % (ref 34.8–46.6)
HEMOGLOBIN: 13.9 g/dL (ref 11.6–15.9)
LYMPH#: 1.1 10*3/uL (ref 0.9–3.3)
LYMPH%: 21.8 % (ref 14.0–49.7)
MCH: 30.3 pg (ref 25.1–34.0)
MCHC: 34.3 g/dL (ref 31.5–36.0)
MCV: 88.3 fL (ref 79.5–101.0)
MONO#: 0.4 10*3/uL (ref 0.1–0.9)
MONO%: 7 % (ref 0.0–14.0)
NEUT%: 68.8 % (ref 38.4–76.8)
NEUTROS ABS: 3.6 10*3/uL (ref 1.5–6.5)
PLATELETS: 220 10*3/uL (ref 145–400)
RBC: 4.58 10*6/uL (ref 3.70–5.45)
RDW: 13.8 % (ref 11.2–14.5)
WBC: 5.2 10*3/uL (ref 3.9–10.3)

## 2017-04-25 LAB — COMPREHENSIVE METABOLIC PANEL
ALBUMIN: 3.8 g/dL (ref 3.5–5.0)
ALT: 15 U/L (ref 0–55)
ANION GAP: 8 meq/L (ref 3–11)
AST: 13 U/L (ref 5–34)
Alkaline Phosphatase: 79 U/L (ref 40–150)
BILIRUBIN TOTAL: 0.47 mg/dL (ref 0.20–1.20)
BUN: 12.8 mg/dL (ref 7.0–26.0)
CALCIUM: 9.6 mg/dL (ref 8.4–10.4)
CHLORIDE: 108 meq/L (ref 98–109)
CO2: 26 mEq/L (ref 22–29)
CREATININE: 0.9 mg/dL (ref 0.6–1.1)
EGFR: 69 mL/min/{1.73_m2} — ABNORMAL LOW (ref >90)
Glucose: 95 mg/dl (ref 70–140)
Potassium: 4.5 mEq/L (ref 3.5–5.1)
Sodium: 142 mEq/L (ref 136–145)
TOTAL PROTEIN: 6.6 g/dL (ref 6.4–8.3)

## 2017-04-25 MED ORDER — IOPAMIDOL (ISOVUE-300) INJECTION 61%
75.0000 mL | Freq: Once | INTRAVENOUS | Status: AC | PRN
  Administered 2017-04-25: 75 mL via INTRAVENOUS

## 2017-04-25 MED ORDER — IOPAMIDOL (ISOVUE-300) INJECTION 61%
INTRAVENOUS | Status: AC
  Filled 2017-04-25: qty 100

## 2017-04-26 ENCOUNTER — Other Ambulatory Visit: Payer: Medicare Other

## 2017-05-03 ENCOUNTER — Ambulatory Visit: Payer: Medicare Other | Admitting: Internal Medicine

## 2017-05-04 ENCOUNTER — Ambulatory Visit (HOSPITAL_BASED_OUTPATIENT_CLINIC_OR_DEPARTMENT_OTHER): Payer: Medicare HMO | Admitting: Internal Medicine

## 2017-05-04 ENCOUNTER — Encounter: Payer: Self-pay | Admitting: Internal Medicine

## 2017-05-04 ENCOUNTER — Telehealth: Payer: Self-pay | Admitting: Internal Medicine

## 2017-05-04 VITALS — BP 98/54 | HR 84 | Temp 98.2°F | Resp 18 | Ht 69.0 in | Wt 155.6 lb

## 2017-05-04 DIAGNOSIS — I2510 Atherosclerotic heart disease of native coronary artery without angina pectoris: Secondary | ICD-10-CM | POA: Diagnosis not present

## 2017-05-04 DIAGNOSIS — Z86718 Personal history of other venous thrombosis and embolism: Secondary | ICD-10-CM

## 2017-05-04 DIAGNOSIS — C3490 Malignant neoplasm of unspecified part of unspecified bronchus or lung: Secondary | ICD-10-CM

## 2017-05-04 DIAGNOSIS — Z85038 Personal history of other malignant neoplasm of large intestine: Secondary | ICD-10-CM | POA: Diagnosis not present

## 2017-05-04 NOTE — Progress Notes (Signed)
[place] Telephone:(336) [phone]   Fax:(336) [phone]  OFFICE PROGRESS NOTE  [name], NP 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    PRIOR THERAPY:  1) 6 cycles of chemotherapy with cisplatin and VP-16 in combination with Neulasta from 07/14/2010  through 11/02/2010. She received radiation treatments to the chest from 10/04/2010 through 11/19/2010. 2) prophylactic whole- brain radiation 2500 cGy in 10 fractions from 12/07/2010 through 12/20/2010.  CURRENT THERAPY: Observation.  ADVANCE DIRECTIVES: The patient does not have metastatic and is not interested in getting information at this point.  INTERVAL HISTORY: Hailey Hill 66 y.o. female returns to the clinic today for annual follow-up visit. The patient is feeling fine today with no specific complaints. She denied having any chest pain, shortness of breath, cough or hemoptysis. She denied having any fever or chills. She has no nausea, vomiting, diarrhea or constipation. She denied having any weight loss or night sweats. She had repeat CT scan of the chest performed recently and she is here for evaluation and discussion of her scan results.   MEDICAL HISTORY: Past Medical History:  Diagnosis Date  . Complication of anesthesia    had complications with "gas buildup" after tubal lig  . Dyslipidemia   . Lung cancer ([place]) dx'd 07/2010   sm cell   . Peripheral neuropathy   . SVC syndrome   . Thrombus 65/46/5035   Left basilic vein,  Left subclavian vein    ALLERGIES:  is allergic to promethazine hcl.  MEDICATIONS:  Current Outpatient Prescriptions  Medication Sig Dispense Refill  . aspirin  81 MG chewable tablet Chew 81 mg by mouth 2 (two) times daily.     No current facility-administered medications for this visit.     REVIEW OF SYSTEMS:  A comprehensive review of systems was negative.   PHYSICAL EXAMINATION: General appearance: alert, cooperative and no distress Head: Normocephalic, without obvious abnormality, atraumatic Neck: no adenopathy, no JVD, supple, symmetrical, trachea midline and thyroid not enlarged, symmetric, no tenderness/mass/nodules Lymph nodes: Cervical, supraclavicular, and axillary nodes normal. Resp: clear to auscultation bilaterally Back: symmetric, no curvature. ROM normal. No CVA tenderness. Cardio: regular rate and rhythm, S1, S2 normal, no murmur, click, rub or gallop GI: soft, non-tender; bowel sounds normal; no masses,  no organomegaly Extremities: extremities normal, atraumatic, no cyanosis or edema  ECOG PERFORMANCE STATUS: 0 - Asymptomatic  Blood pressure (!) 98/54, pulse 84, temperature 98.2 F (36.8 C), temperature source Oral, resp. rate 18, height 5\' 9"  (1.753 m), weight 155 lb 9.6 oz (70.6 kg), SpO2 99 %.  LABORATORY DATA: Lab Results  Component Value Date   WBC 5.2 04/25/2017   HGB 13.9 04/25/2017   HCT 40.4 04/25/2017   MCV 88.3 04/25/2017   PLT 220 04/25/2017      Chemistry      Component Value Date/Time   NA 142 04/25/2017 0805   K 4.5 04/25/2017 0805   CL 104 12/14/2015 2105   CL 108 (H) 01/09/2013 0913   CO2 26 04/25/2017 0805   BUN 12.8 04/25/2017 0805   CREATININE 0.9 04/25/2017 0805      Component Value Date/Time   CALCIUM 9.6 04/25/2017 0805   ALKPHOS  79 04/25/2017 0805   AST 13 04/25/2017 0805   ALT 15 04/25/2017 0805   BILITOT 0.47 04/25/2017 0805       RADIOGRAPHIC STUDIES: Ct Chest W Contrast  Result Date: 04/25/2017 CLINICAL DATA:  Lung cancer diagnosed 9/11 with chemotherapy and radiation therapy complete. Asymptomatic. Small cell primary. EXAM: CT CHEST WITH CONTRAST TECHNIQUE: Multidetector  CT imaging of the chest was performed during intravenous contrast administration. CONTRAST:  72mL ISOVUE-300 IOPAMIDOL (ISOVUE-300) INJECTION 61% COMPARISON:  04/25/2016 FINDINGS: Cardiovascular: Aortic and branch vessel atherosclerosis. Normal heart size, without pericardial effusion. Lad coronary artery atherosclerosis. No central pulmonary embolism, on this non-dedicated study. Mediastinum/Nodes: No supraclavicular adenopathy. No mediastinal or hilar adenopathy. Lungs/Pleura: No pleural fluid. Left lower lobe scarring. A subpleural left lower lobe pulmonary nodule measures 4 mm on image 105/series 7 and is similar. a 3 mm right middle lobe pulmonary nodule on image 90/series 7, similar. Left greater than right paramediastinal radiation fibrosis is not significantly changed. Upper Abdomen: Left hepatic lobe cysts. Normal imaged portions of the spleen, stomach, pancreas, adrenal glands, kidneys. Musculoskeletal: Convex right thoracic spine curvature. IMPRESSION: 1. Radiation change within the left greater than right paramediastinal lungs. 2. No recurrent or metastatic disease. 3. Age advanced coronary artery atherosclerosis. Recommend assessment of coronary risk factors and consideration of medical therapy. 4.  Aortic Atherosclerosis (ICD10-I70.0). Electronically Signed   By: Abigail Miyamoto M.D.   On: 04/25/2017 13:56    ASSESSMENT AND PLAN:  This is a very pleasant 66 years old white female with limited stage small cell lung cancer diagnosed in September 2011 status post systemic chemotherapy with cisplatin and etoposide for 6 cycles concurrent with radiation and followed by prophylactic cranial irradiation. The patient has been on observation for the last 7 years and she is doing fine with no specific complaints. Her recent CT scan of the chest showed no evidence for disease recurrence but she continues to have age advanced coronary artery atherosclerosis. I discussed the scan results with the patient today.  I recommended for her to continue on observation with repeat CT scan of the chest in one year. For the atherosclerosis, she was advised to call her primary care physician for reevaluation and consideration of right modification and treatment for any dyslipidemia or cardiac evaluation. She was advised to call immediately if she has any concerning symptoms in the interval. The patient voices understanding of current disease status and treatment options and is in agreement with the current care plan. All questions were answered. The patient knows to call the clinic with any problems, questions or concerns. We can certainly see the patient much sooner if necessary.  Disclaimer: This note was dictated with voice recognition software. Similar sounding words can inadvertently be transcribed and may not be corrected upon review.

## 2017-05-04 NOTE — Telephone Encounter (Signed)
Appointments scheduled per 05/04/17 los. Patient was given a copy of the AVS report and appointment schedule per 05/04/17 los.

## 2018-04-25 ENCOUNTER — Other Ambulatory Visit: Payer: Medicare HMO

## 2018-05-03 ENCOUNTER — Ambulatory Visit: Payer: Medicare HMO | Admitting: Internal Medicine

## 2018-05-07 ENCOUNTER — Inpatient Hospital Stay: Payer: Medicare HMO | Attending: Internal Medicine

## 2018-05-07 ENCOUNTER — Other Ambulatory Visit: Payer: Self-pay | Admitting: Internal Medicine

## 2018-05-07 ENCOUNTER — Encounter (HOSPITAL_COMMUNITY): Payer: Self-pay

## 2018-05-07 ENCOUNTER — Ambulatory Visit (HOSPITAL_COMMUNITY)
Admission: RE | Admit: 2018-05-07 | Discharge: 2018-05-07 | Disposition: A | Discharge status: Home / Self Care | Payer: Medicare HMO | Source: Ambulatory Visit | Attending: Internal Medicine | Admitting: Internal Medicine

## 2018-05-07 DIAGNOSIS — C3490 Malignant neoplasm of unspecified part of unspecified bronchus or lung: Secondary | ICD-10-CM | POA: Diagnosis present

## 2018-05-07 DIAGNOSIS — Z85118 Personal history of other malignant neoplasm of bronchus and lung: Secondary | ICD-10-CM | POA: Diagnosis not present

## 2018-05-07 DIAGNOSIS — I700 Atherosclerosis of aorta: Secondary | ICD-10-CM | POA: Insufficient documentation

## 2018-05-07 DIAGNOSIS — Z923 Personal history of irradiation: Secondary | ICD-10-CM | POA: Diagnosis not present

## 2018-05-07 LAB — CBC WITH DIFFERENTIAL (CANCER CENTER ONLY)
BASOS PCT: 1 %
Basophils Absolute: 0 10*3/uL (ref 0.0–0.1)
Eosinophils Absolute: 0.1 10*3/uL (ref 0.0–0.5)
Eosinophils Relative: 2 %
HCT: 41 % (ref 34.8–46.6)
HEMOGLOBIN: 13.9 g/dL (ref 11.6–15.9)
LYMPHS PCT: 24 %
Lymphs Abs: 1.2 10*3/uL (ref 0.9–3.3)
MCH: 30.7 pg (ref 25.1–34.0)
MCHC: 33.9 g/dL (ref 31.5–36.0)
MCV: 90.5 fL (ref 79.5–101.0)
MONO ABS: 0.3 10*3/uL (ref 0.1–0.9)
Monocytes Relative: 5 %
NEUTROS ABS: 3.6 10*3/uL (ref 1.5–6.5)
Neutrophils Relative %: 68 %
Platelet Count: 235 10*3/uL (ref 145–400)
RBC: 4.53 MIL/uL (ref 3.70–5.45)
RDW: 13.5 % (ref 11.2–14.5)
WBC: 5.3 10*3/uL (ref 3.9–10.3)

## 2018-05-07 LAB — CMP (CANCER CENTER ONLY)
ALK PHOS: 94 U/L (ref 38–126)
ALT: 14 U/L (ref 0–44)
ANION GAP: 6 (ref 5–15)
AST: 12 U/L — ABNORMAL LOW (ref 15–41)
Albumin: 4 g/dL (ref 3.5–5.0)
BUN: 12 mg/dL (ref 8–23)
CALCIUM: 9.7 mg/dL (ref 8.9–10.3)
CO2: 30 mmol/L (ref 22–32)
CREATININE: 0.87 mg/dL (ref 0.44–1.00)
Chloride: 106 mmol/L (ref 98–111)
Glucose, Bld: 92 mg/dL (ref 70–99)
Potassium: 4.6 mmol/L (ref 3.5–5.1)
Sodium: 142 mmol/L (ref 135–145)
TOTAL PROTEIN: 6.6 g/dL (ref 6.5–8.1)
Total Bilirubin: 0.3 mg/dL (ref 0.3–1.2)

## 2018-05-07 MED ORDER — IOHEXOL 300 MG/ML  SOLN
75.0000 mL | Freq: Once | INTRAMUSCULAR | Status: AC | PRN
  Administered 2018-05-07: 75 mL via INTRAVENOUS

## 2018-05-16 ENCOUNTER — Encounter: Payer: Self-pay | Admitting: Internal Medicine

## 2018-05-16 ENCOUNTER — Telehealth: Payer: Self-pay

## 2018-05-16 ENCOUNTER — Inpatient Hospital Stay: Payer: Medicare HMO | Admitting: Internal Medicine

## 2018-05-16 VITALS — BP 135/79 | HR 75 | Temp 98.3°F | Resp 18 | Ht 69.0 in | Wt 162.7 lb

## 2018-05-16 DIAGNOSIS — C3490 Malignant neoplasm of unspecified part of unspecified bronchus or lung: Secondary | ICD-10-CM

## 2018-05-16 DIAGNOSIS — Z85118 Personal history of other malignant neoplasm of bronchus and lung: Secondary | ICD-10-CM | POA: Diagnosis not present

## 2018-05-16 NOTE — Progress Notes (Signed)
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    PRIOR THERAPY:  1) 6 cycles of chemotherapy with cisplatin and VP-16 in combination with Neulasta from 07/14/2010  through 11/02/2010. She received radiation treatments to the chest from 10/04/2010 through 11/19/2010. 2) prophylactic whole- brain radiation 2500 cGy in 10 fractions from 12/07/2010 through 12/20/2010.  CURRENT THERAPY: Observation.  ADVANCE DIRECTIVES: The patient does not have metastatic and is not interested in getting information at this point.  INTERVAL HISTORY: Hailey Hill 67 y.o. female returns to the clinic today for annual follow-up visit.  The patient is feeling fine today with no concerning complaints.  She denied having any chest pain, shortness breath, cough or hemoptysis.  She denied having any weight loss or night sweats.  She has no nausea, vomiting, diarrhea or constipation.  She has no recent weight loss or night sweats.  The patient had repeat CT scan of the chest performed recently and she is here for evaluation and discussion of her risk her results.  MEDICAL HISTORY: Past Medical History:  Diagnosis Date  . Complication of anesthesia    had complications with "gas buildup" after tubal lig  . Dyslipidemia   . Lung cancer ([place]) dx'd 07/2010   sm cell   . Peripheral neuropathy   . SVC syndrome   . Thrombus 95/18/8416   Left basilic vein,  Left subclavian vein    ALLERGIES:  is allergic to promethazine hcl.  MEDICATIONS:  No current outpatient medications on file.   No  current facility-administered medications for this visit.     REVIEW OF SYSTEMS:  A comprehensive review of systems was negative.   PHYSICAL EXAMINATION: General appearance: alert, cooperative and no distress Head: Normocephalic, without obvious abnormality, atraumatic Neck: no adenopathy, no JVD, supple, symmetrical, trachea midline and thyroid not enlarged, symmetric, no tenderness/mass/nodules Lymph nodes: Cervical, supraclavicular, and axillary nodes normal. Resp: clear to auscultation bilaterally Back: symmetric, no curvature. ROM normal. No CVA tenderness. Cardio: regular rate and rhythm, S1, S2 normal, no murmur, click, rub or gallop GI: soft, non-tender; bowel sounds normal; no masses,  no organomegaly Extremities: extremities normal, atraumatic, no cyanosis or edema  ECOG PERFORMANCE STATUS: 0 - Asymptomatic  Blood pressure 135/79, pulse 75, temperature 98.3 F (36.8 C), temperature source Oral, resp. rate 18, height 5\' 9"  (1.753 m), weight 162 lb 11.2 oz (73.8 kg), SpO2 98 %.  LABORATORY DATA: Lab Results  Component Value Date   WBC 5.3 05/07/2018   HGB 13.9 05/07/2018   HCT 41.0 05/07/2018   MCV 90.5 05/07/2018   PLT 235 05/07/2018      Chemistry      Component Value Date/Time   NA 142 05/07/2018 0853   NA 142 04/25/2017 0805   K 4.6 05/07/2018 0853   K 4.5 04/25/2017 0805   CL 106 05/07/2018 0853   CL 108 (H) 01/09/2013 0913   CO2 30 05/07/2018 0853   CO2 26 04/25/2017 0805   BUN 12 05/07/2018 0853   BUN 12.8 04/25/2017 0805   CREATININE 0.87 05/07/2018 0853   CREATININE 0.9 04/25/2017 0805  Component Value Date/Time   CALCIUM 9.7 05/07/2018 0853   CALCIUM 9.6 04/25/2017 0805   ALKPHOS 94 05/07/2018 0853   ALKPHOS 79 04/25/2017 0805   AST 12 (L) 05/07/2018 0853   AST 13 04/25/2017 0805   ALT 14 05/07/2018 0853   ALT 15 04/25/2017 0805   BILITOT 0.3 05/07/2018 0853   BILITOT 0.47 04/25/2017 0805       RADIOGRAPHIC STUDIES: Ct Chest W  Contrast  Result Date: 05/07/2018 CLINICAL DATA:  Lung cancer, status post chemotherapy and XRT EXAM: CT CHEST WITH CONTRAST TECHNIQUE: Multidetector CT imaging of the chest was performed during intravenous contrast administration. CONTRAST:  76mL OMNIPAQUE IOHEXOL 300 MG/ML  SOLN COMPARISON:  04/25/2017 FINDINGS: Cardiovascular: Heart is normal in size.  No pericardial effusion. No evidence of thoracic aortic aneurysm. Mild atherosclerotic calcifications of the aortic arch. Mediastinum/Nodes: No suspicious mediastinal lymphadenopathy. Visualized left thyroid is mildly nodular. Lungs/Pleura: Radiation changes in the left upper/lower lobe and paramediastinal region. Mild radiation changes in the medial left upper lobe/paramediastinal region. 3 mm triangular subpleural nodule in the lateral right middle lobe (series 7/image 96), unchanged, benign. 4 mm triangular subpleural nodule in the lateral left lower lobe (series 7/image 110), unchanged, benign. No focal consolidation. No pleural effusion or pneumothorax. Upper Abdomen: Visualized upper abdomen is grossly unremarkable. Musculoskeletal: Visualized osseous structures are within normal limits. IMPRESSION: Radiation changes, as described above. No evidence of recurrent or metastatic disease. Aortic Atherosclerosis (ICD10-I70.0). Electronically Signed   By: Julian Hy M.D.   On: 05/07/2018 12:09    ASSESSMENT AND PLAN:  This is a very pleasant 67 years old white female with limited stage small cell lung cancer diagnosed in September 2011 status post systemic chemotherapy with cisplatin and etoposide for 6 cycles concurrent with radiation and followed by prophylactic cranial irradiation. She has been in observation since completion of her treatment 8 years ago.  The patient is doing fine with no concerning complaints. Repeat CT scan of the chest showed no concerning findings for disease recurrence or metastasis.  I discussed the scan results with the  patient and recommended for her to continue on observation with repeat CT scan of the chest in 1 year. She was advised to call immediately if she has any concerning symptoms in the interval. The patient voices understanding of current disease status and treatment options and is in agreement with the current care plan. All questions were answered. The patient knows to call the clinic with any problems, questions or concerns. We can certainly see the patient much sooner if necessary.  Disclaimer: This note was dictated with voice recognition software. Similar sounding words can inadvertently be transcribed and may not be corrected upon review.

## 2018-05-16 NOTE — Telephone Encounter (Signed)
Printed avs and calender of upcoming appointment. Per 7/10 los.  

## 2018-06-26 ENCOUNTER — Other Ambulatory Visit: Payer: Self-pay | Admitting: *Deleted

## 2018-06-26 DIAGNOSIS — M8580 Other specified disorders of bone density and structure, unspecified site: Secondary | ICD-10-CM

## 2019-05-08 ENCOUNTER — Ambulatory Visit (HOSPITAL_COMMUNITY)
Admission: RE | Admit: 2019-05-08 | Discharge: 2019-05-08 | Disposition: A | Discharge status: Home / Self Care | Payer: Medicare HMO | Source: Ambulatory Visit | Attending: Internal Medicine | Admitting: Internal Medicine

## 2019-05-08 ENCOUNTER — Other Ambulatory Visit: Payer: Self-pay

## 2019-05-08 ENCOUNTER — Inpatient Hospital Stay: Payer: Medicare HMO | Attending: Internal Medicine

## 2019-05-08 DIAGNOSIS — C3490 Malignant neoplasm of unspecified part of unspecified bronchus or lung: Secondary | ICD-10-CM | POA: Diagnosis present

## 2019-05-08 DIAGNOSIS — Z85118 Personal history of other malignant neoplasm of bronchus and lung: Secondary | ICD-10-CM | POA: Diagnosis not present

## 2019-05-08 LAB — CBC WITH DIFFERENTIAL (CANCER CENTER ONLY)
Abs Immature Granulocytes: 0.02 10*3/uL (ref 0.00–0.07)
Basophils Absolute: 0.1 10*3/uL (ref 0.0–0.1)
Basophils Relative: 1 %
Eosinophils Absolute: 0.1 10*3/uL (ref 0.0–0.5)
Eosinophils Relative: 2 %
HCT: 40.4 % (ref 36.0–46.0)
Hemoglobin: 13.7 g/dL (ref 12.0–15.0)
Immature Granulocytes: 0 %
Lymphocytes Relative: 20 %
Lymphs Abs: 1.1 10*3/uL (ref 0.7–4.0)
MCH: 30.9 pg (ref 26.0–34.0)
MCHC: 33.9 g/dL (ref 30.0–36.0)
MCV: 91 fL (ref 80.0–100.0)
Monocytes Absolute: 0.3 10*3/uL (ref 0.1–1.0)
Monocytes Relative: 6 %
Neutro Abs: 3.9 10*3/uL (ref 1.7–7.7)
Neutrophils Relative %: 71 %
Platelet Count: 265 10*3/uL (ref 150–400)
RBC: 4.44 MIL/uL (ref 3.87–5.11)
RDW: 12.9 % (ref 11.5–15.5)
WBC Count: 5.5 10*3/uL (ref 4.0–10.5)
nRBC: 0 % (ref 0.0–0.2)

## 2019-05-08 LAB — CMP (CANCER CENTER ONLY)
ALT: 12 U/L (ref 0–44)
AST: 13 U/L — ABNORMAL LOW (ref 15–41)
Albumin: 3.9 g/dL (ref 3.5–5.0)
Alkaline Phosphatase: 94 U/L (ref 38–126)
Anion gap: 9 (ref 5–15)
BUN: 10 mg/dL (ref 8–23)
CO2: 26 mmol/L (ref 22–32)
Calcium: 9.2 mg/dL (ref 8.9–10.3)
Chloride: 105 mmol/L (ref 98–111)
Creatinine: 0.9 mg/dL (ref 0.44–1.00)
GFR, Est AFR Am: >60 mL/min (ref >60)
GFR, Estimated: >60 mL/min (ref >60)
Glucose, Bld: 95 mg/dL (ref 70–99)
Potassium: 4.7 mmol/L (ref 3.5–5.1)
Sodium: 140 mmol/L (ref 135–145)
Total Bilirubin: 0.3 mg/dL (ref 0.3–1.2)
Total Protein: 6.7 g/dL (ref 6.5–8.1)

## 2019-05-08 MED ORDER — SODIUM CHLORIDE (PF) 0.9 % IJ SOLN
INTRAMUSCULAR | Status: AC
  Filled 2019-05-08: qty 50

## 2019-05-08 MED ORDER — IOHEXOL 300 MG/ML  SOLN
75.0000 mL | Freq: Once | INTRAMUSCULAR | Status: AC | PRN
  Administered 2019-05-08: 75 mL via INTRAVENOUS

## 2019-05-14 ENCOUNTER — Ambulatory Visit: Payer: Medicare HMO | Admitting: Internal Medicine

## 2019-05-15 ENCOUNTER — Inpatient Hospital Stay: Payer: Medicare HMO | Admitting: Internal Medicine

## 2019-05-15 ENCOUNTER — Other Ambulatory Visit: Payer: Self-pay

## 2019-05-15 ENCOUNTER — Encounter: Payer: Self-pay | Admitting: Internal Medicine

## 2019-05-15 VITALS — BP 115/76 | HR 85 | Temp 98.0°F | Resp 18 | Ht 69.0 in | Wt 161.6 lb

## 2019-05-15 DIAGNOSIS — C3490 Malignant neoplasm of unspecified part of unspecified bronchus or lung: Secondary | ICD-10-CM

## 2019-05-15 DIAGNOSIS — Z85118 Personal history of other malignant neoplasm of bronchus and lung: Secondary | ICD-10-CM | POA: Diagnosis not present

## 2019-05-15 NOTE — Progress Notes (Signed)
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    PRIOR THERAPY:  1) status post 6 cycles of chemotherapy with cisplatin and VP-16 in combination with Neulasta from 07/14/2010  through 11/02/2010. She received radiation treatments to the chest from 10/04/2010 through 11/19/2010. 2) prophylactic whole- brain radiation 2500 cGy in 10 fractions from 12/07/2010 through 12/20/2010.  CURRENT THERAPY: Observation.  INTERVAL HISTORY: STACYE NOORI 68 y.o. female returns to the clinic today for follow-up visit.  The patient is feeling fine today with no concerning complaints.  She walks around 3 miles every day.  She denied having any chest pain, shortness of breath, cough or hemoptysis.  She denied having any fever or chills.  She has no nausea, vomiting, diarrhea or constipation.  She has no significant weight loss or night sweats.  The patient had repeat CT scan of the chest performed recently and she is here for evaluation and discussion of her scan results.  MEDICAL HISTORY: Past Medical History:  Diagnosis Date  . Complication of anesthesia    had complications with "gas buildup" after tubal lig  . Dyslipidemia   . Lung cancer ([place]) dx'd 07/2010   sm cell   . Peripheral neuropathy   . SVC syndrome   . Thrombus 25/03/3975   Left basilic vein,  Left subclavian vein    ALLERGIES:  is allergic to promethazine hcl.  MEDICATIONS:  No current outpatient medications on file.   No current facility-administered medications for this visit.     REVIEW OF SYSTEMS:  A comprehensive review of  systems was negative.   PHYSICAL EXAMINATION: General appearance: alert, cooperative and no distress Head: Normocephalic, without obvious abnormality, atraumatic Neck: no adenopathy, no JVD, supple, symmetrical, trachea midline and thyroid not enlarged, symmetric, no tenderness/mass/nodules Lymph nodes: Cervical, supraclavicular, and axillary nodes normal. Resp: clear to auscultation bilaterally Back: symmetric, no curvature. ROM normal. No CVA tenderness. Cardio: regular rate and rhythm, S1, S2 normal, no murmur, click, rub or gallop GI: soft, non-tender; bowel sounds normal; no masses,  no organomegaly Extremities: extremities normal, atraumatic, no cyanosis or edema  ECOG PERFORMANCE STATUS: 0 - Asymptomatic  Blood pressure 115/76, pulse 85, temperature 98 F (36.7 C), temperature source Oral, resp. rate 18, height 5\' 9"  (1.753 m), weight 161 lb 9.6 oz (73.3 kg).  LABORATORY DATA: Lab Results  Component Value Date   WBC 5.5 05/08/2019   HGB 13.7 05/08/2019   HCT 40.4 05/08/2019   MCV 91.0 05/08/2019   PLT 265 05/08/2019      Chemistry      Component Value Date/Time   NA 140 05/08/2019 0739   NA 142 04/25/2017 0805   K 4.7 05/08/2019 0739   K 4.5 04/25/2017 0805   CL 105 05/08/2019 0739   CL 108 (H) 01/09/2013 0913   CO2 26 05/08/2019 0739   CO2 26 04/25/2017 0805   BUN 10 05/08/2019 0739   BUN 12.8 04/25/2017 0805   CREATININE 0.90 05/08/2019 0739   CREATININE 0.9 04/25/2017 0805      Component Value Date/Time   CALCIUM 9.2 05/08/2019 0739   CALCIUM 9.6 04/25/2017  0805   ALKPHOS 94 05/08/2019 0739   ALKPHOS 79 04/25/2017 0805   AST 13 (L) 05/08/2019 0739   AST 13 04/25/2017 0805   ALT 12 05/08/2019 0739   ALT 15 04/25/2017 0805   BILITOT 0.3 05/08/2019 0739   BILITOT 0.47 04/25/2017 0805       RADIOGRAPHIC STUDIES: Ct Chest W Contrast  Result Date: 05/08/2019 CLINICAL DATA:  Small cell lung cancer diagnosed 2011 status post chemotherapy and radiation  therapy. Interval observation. Restaging. EXAM: CT CHEST WITH CONTRAST TECHNIQUE: Multidetector CT imaging of the chest was performed during intravenous contrast administration. CONTRAST:  31mL OMNIPAQUE IOHEXOL 300 MG/ML  SOLN COMPARISON:  05/07/2018 chest CT. FINDINGS: Cardiovascular: Normal heart size. No significant pericardial effusion/thickening. Left anterior descending coronary atherosclerosis. Atherosclerotic nonaneurysmal thoracic aorta. Normal caliber pulmonary arteries. No central pulmonary emboli. Mediastinum/Nodes: Stable subcentimeter hypodense posterior left thyroid lobe nodule. Unremarkable esophagus. No pathologically enlarged axillary, mediastinal or hilar lymph nodes. Stable minimal ill-defined soft tissue density in the AP window compatible with post treatment change. Lungs/Pleura: No pneumothorax. No pleural effusion. Sharply marginated left perihilar lung consolidation with associated mild distortion, bronchiectasis and volume loss, stable, compatible with radiation fibrosis. No acute consolidative airspace disease or lung masses. A few scattered tiny solid pulmonary nodules in the lungs bilaterally, largest 4 mm in the right lower lobe anteriorly (series 7/image 69), all stable and considered benign. No new significant pulmonary nodules. Upper abdomen: Simple 1.9 cm anterior left liver lobe cyst. Subcentimeter hypodense separate anterior left liver lobe lesion is too small to characterize and is unchanged, presumably benign. Musculoskeletal: No aggressive appearing focal osseous lesions. Mild thoracic spondylosis. IMPRESSION: Stable left perihilar radiation fibrosis. No evidence of recurrent or metastatic disease in the chest. Aortic Atherosclerosis (ICD10-I70.0). Electronically Signed   By: Ilona Sorrel M.D.   On: 05/08/2019 11:00    ASSESSMENT AND PLAN:  This is a very pleasant 68 years old white female with limited stage small cell lung cancer diagnosed in September 2011 status post  systemic chemotherapy with cisplatin and etoposide for 6 cycles concurrent with radiation and followed by prophylactic cranial irradiation. The patient has been on observation for more than 9 years now.  She is feeling fine with no concerning complaints. She had repeat CT scan of the chest performed recently.  I personally and independently reviewed the scans and discussed the results with the patient today. Had a scan showed no concerning findings for disease recurrence.  I recommended for the patient to continue on observation.  She would like to start following up with her nurse practitioner for now on and she will see [place] on a as needed basis. She was advised to call if she has any concerning symptoms in the interval. The patient voices understanding of current disease status and treatment options and is in agreement with the current care plan. All questions were answered. The patient knows to call the clinic with any problems, questions or concerns. We can certainly see the patient much sooner if necessary.  Disclaimer: This note was dictated with voice recognition software. Similar sounding words can inadvertently be transcribed and may not be corrected upon review.

## 2019-07-07 ENCOUNTER — Emergency Department (HOSPITAL_COMMUNITY): Payer: Medicare HMO

## 2019-07-07 ENCOUNTER — Inpatient Hospital Stay (HOSPITAL_COMMUNITY)
Admission: EM | Admit: 2019-07-07 | Discharge: 2019-07-11 | DRG: 054 | Disposition: A | Discharge status: Skilled Nursing Facility | Payer: Medicare HMO | Attending: Internal Medicine | Admitting: Internal Medicine

## 2019-07-07 ENCOUNTER — Encounter (HOSPITAL_COMMUNITY): Payer: Self-pay

## 2019-07-07 ENCOUNTER — Other Ambulatory Visit: Payer: Self-pay

## 2019-07-07 DIAGNOSIS — W19XXXD Unspecified fall, subsequent encounter: Secondary | ICD-10-CM | POA: Diagnosis not present

## 2019-07-07 DIAGNOSIS — Z888 Allergy status to other drugs, medicaments and biological substances status: Secondary | ICD-10-CM

## 2019-07-07 DIAGNOSIS — Z923 Personal history of irradiation: Secondary | ICD-10-CM | POA: Diagnosis not present

## 2019-07-07 DIAGNOSIS — S42292A Other displaced fracture of upper end of left humerus, initial encounter for closed fracture: Secondary | ICD-10-CM | POA: Diagnosis present

## 2019-07-07 DIAGNOSIS — R296 Repeated falls: Secondary | ICD-10-CM | POA: Diagnosis present

## 2019-07-07 DIAGNOSIS — C7931 Secondary malignant neoplasm of brain: Secondary | ICD-10-CM | POA: Diagnosis present

## 2019-07-07 DIAGNOSIS — C799 Secondary malignant neoplasm of unspecified site: Secondary | ICD-10-CM

## 2019-07-07 DIAGNOSIS — C3490 Malignant neoplasm of unspecified part of unspecified bronchus or lung: Secondary | ICD-10-CM | POA: Diagnosis present

## 2019-07-07 DIAGNOSIS — Z87891 Personal history of nicotine dependence: Secondary | ICD-10-CM

## 2019-07-07 DIAGNOSIS — W19XXXA Unspecified fall, initial encounter: Secondary | ICD-10-CM | POA: Diagnosis present

## 2019-07-07 DIAGNOSIS — S42292D Other displaced fracture of upper end of left humerus, subsequent encounter for fracture with routine healing: Secondary | ICD-10-CM | POA: Diagnosis not present

## 2019-07-07 DIAGNOSIS — Z9221 Personal history of antineoplastic chemotherapy: Secondary | ICD-10-CM

## 2019-07-07 DIAGNOSIS — G936 Cerebral edema: Secondary | ICD-10-CM | POA: Diagnosis present

## 2019-07-07 DIAGNOSIS — G629 Polyneuropathy, unspecified: Secondary | ICD-10-CM | POA: Diagnosis present

## 2019-07-07 DIAGNOSIS — Z20828 Contact with and (suspected) exposure to other viral communicable diseases: Secondary | ICD-10-CM | POA: Diagnosis present

## 2019-07-07 DIAGNOSIS — E785 Hyperlipidemia, unspecified: Secondary | ICD-10-CM | POA: Diagnosis present

## 2019-07-07 LAB — CBC
HCT: 37.6 % (ref 36.0–46.0)
Hemoglobin: 12.8 g/dL (ref 12.0–15.0)
MCH: 30.9 pg (ref 26.0–34.0)
MCHC: 34 g/dL (ref 30.0–36.0)
MCV: 90.8 fL (ref 80.0–100.0)
Platelets: 244 10*3/uL (ref 150–400)
RBC: 4.14 MIL/uL (ref 3.87–5.11)
RDW: 13.1 % (ref 11.5–15.5)
WBC: 11.2 10*3/uL — ABNORMAL HIGH (ref 4.0–10.5)
nRBC: 0 % (ref 0.0–0.2)

## 2019-07-07 LAB — CBC WITH DIFFERENTIAL/PLATELET
Abs Immature Granulocytes: 0.05 10*3/uL (ref 0.00–0.07)
Basophils Absolute: 0 10*3/uL (ref 0.0–0.1)
Basophils Relative: 0 %
Eosinophils Absolute: 0 10*3/uL (ref 0.0–0.5)
Eosinophils Relative: 0 %
HCT: 38.3 % (ref 36.0–46.0)
Hemoglobin: 12.8 g/dL (ref 12.0–15.0)
Immature Granulocytes: 0 %
Lymphocytes Relative: 11 %
Lymphs Abs: 1.3 10*3/uL (ref 0.7–4.0)
MCH: 30.5 pg (ref 26.0–34.0)
MCHC: 33.4 g/dL (ref 30.0–36.0)
MCV: 91.4 fL (ref 80.0–100.0)
Monocytes Absolute: 0.7 10*3/uL (ref 0.1–1.0)
Monocytes Relative: 6 %
Neutro Abs: 9.3 10*3/uL — ABNORMAL HIGH (ref 1.7–7.7)
Neutrophils Relative %: 83 %
Platelets: 242 10*3/uL (ref 150–400)
RBC: 4.19 MIL/uL (ref 3.87–5.11)
RDW: 13.2 % (ref 11.5–15.5)
WBC: 11.3 10*3/uL — ABNORMAL HIGH (ref 4.0–10.5)
nRBC: 0 % (ref 0.0–0.2)

## 2019-07-07 LAB — BASIC METABOLIC PANEL
Anion gap: 11 (ref 5–15)
BUN: 15 mg/dL (ref 8–23)
CO2: 25 mmol/L (ref 22–32)
Calcium: 9.3 mg/dL (ref 8.9–10.3)
Chloride: 104 mmol/L (ref 98–111)
Creatinine, Ser: 0.72 mg/dL (ref 0.44–1.00)
GFR calc Af Amer: >60 mL/min (ref >60)
GFR calc non Af Amer: >60 mL/min (ref >60)
Glucose, Bld: 123 mg/dL — ABNORMAL HIGH (ref 70–99)
Potassium: 3.9 mmol/L (ref 3.5–5.1)
Sodium: 140 mmol/L (ref 135–145)

## 2019-07-07 LAB — CREATININE, SERUM
Creatinine, Ser: 0.69 mg/dL (ref 0.44–1.00)
GFR calc Af Amer: >60 mL/min (ref >60)
GFR calc non Af Amer: >60 mL/min (ref >60)

## 2019-07-07 LAB — URINALYSIS, ROUTINE W REFLEX MICROSCOPIC
Bilirubin Urine: NEGATIVE
Glucose, UA: NEGATIVE mg/dL
Hgb urine dipstick: NEGATIVE
Ketones, ur: NEGATIVE mg/dL
Leukocytes,Ua: NEGATIVE
Nitrite: NEGATIVE
Protein, ur: NEGATIVE mg/dL
Specific Gravity, Urine: 1.01 (ref 1.005–1.030)
pH: 6 (ref 5.0–8.0)

## 2019-07-07 LAB — SARS CORONAVIRUS 2 BY RT PCR (HOSPITAL ORDER, PERFORMED IN ~~LOC~~ HOSPITAL LAB): SARS Coronavirus 2: NEGATIVE

## 2019-07-07 MED ORDER — ENOXAPARIN SODIUM 40 MG/0.4ML ~~LOC~~ SOLN
40.0000 mg | Freq: Every day | SUBCUTANEOUS | Status: DC
  Administered 2019-07-07 – 2019-07-10 (×4): 40 mg via SUBCUTANEOUS
  Filled 2019-07-07 (×4): qty 0.4

## 2019-07-07 MED ORDER — ACETAMINOPHEN 650 MG RE SUPP: 650.0000 mg | Freq: Four times a day (QID) | RECTAL | Status: DC | PRN

## 2019-07-07 MED ORDER — DEXAMETHASONE SODIUM PHOSPHATE 10 MG/ML IJ SOLN
10.0000 mg | Freq: Once | INTRAMUSCULAR | Status: AC
  Administered 2019-07-07: 10 mg via INTRAVENOUS
  Filled 2019-07-07: qty 1

## 2019-07-07 MED ORDER — SENNA 8.6 MG PO TABS
1.0000 | ORAL_TABLET | Freq: Two times a day (BID) | ORAL | Status: DC
  Administered 2019-07-09 – 2019-07-10 (×3): 8.6 mg via ORAL
  Filled 2019-07-07 (×6): qty 1

## 2019-07-07 MED ORDER — TRAMADOL HCL 50 MG PO TABS
50.0000 mg | ORAL_TABLET | Freq: Four times a day (QID) | ORAL | Status: DC | PRN
  Administered 2019-07-07 – 2019-07-09 (×3): 50 mg via ORAL
  Filled 2019-07-07 (×3): qty 1

## 2019-07-07 MED ORDER — GADOBUTROL 1 MMOL/ML IV SOLN
7.5000 mL | Freq: Once | INTRAVENOUS | Status: AC | PRN
  Administered 2019-07-07: 16:00:00 7.5 mL via INTRAVENOUS

## 2019-07-07 MED ORDER — ACETAMINOPHEN 325 MG PO TABS
650.0000 mg | ORAL_TABLET | Freq: Four times a day (QID) | ORAL | Status: DC | PRN
  Administered 2019-07-07 – 2019-07-11 (×2): 650 mg via ORAL
  Filled 2019-07-07 (×2): qty 2

## 2019-07-07 MED ORDER — DEXAMETHASONE 4 MG PO TABS
4.0000 mg | ORAL_TABLET | Freq: Four times a day (QID) | ORAL | Status: DC
  Administered 2019-07-07 – 2019-07-11 (×15): 4 mg via ORAL
  Filled 2019-07-07 (×15): qty 1

## 2019-07-07 NOTE — ED Notes (Signed)
Pt refused covid swab and states she will leave AMA

## 2019-07-07 NOTE — ED Provider Notes (Signed)
MRI shows 2 metastatic lesion and edema.  Dr. Vanita Panda in to see and examine pt.   Pt last saw Dr. Julien Nordmann 05/2019. She has a history of small cell lung cancer    Sidney Ace 07/07/19 Silvestre Moment, MD 07/08/19 [phone]

## 2019-07-07 NOTE — ED Notes (Signed)
After talking with the pt, explaining the process and how the test was performed, pt agreed to have the COVID test performed.

## 2019-07-07 NOTE — ED Notes (Signed)
Pt provided with coffee and soup.

## 2019-07-07 NOTE — ED Notes (Signed)
ED TO INPATIENT HANDOFF REPORT  Name/Age/Gender Hailey Hill 68 y.o. female  Code Status    Code Status Orders  (From admission, onward)         Start     Ordered   07/07/19 2114  Full code  Continuous     07/07/19 2117        Code Status History    Date Active Date Inactive Code Status Order ID Comments User Context   12/14/2015 2303 12/16/2015 1751 Full Code [id number]  Etta Quill, DO ED   Advance Care Planning Activity      Home/SNF/Other Home  Chief Complaint fall  Level of Care/Admitting Diagnosis ED Disposition    ED Disposition Condition [place]: [place] [[id number]]  Level of Care: Med-Surg [16]  Covid Evaluation: Asymptomatic Screening Protocol (No Symptoms)  Diagnosis: Metastatic cancer to brain [facility]) [[id number]]  Admitting Physician: Neena Rhymes [[id number]]  Attending Physician: Adella Hare E [[id number]]  Estimated length of stay: 3 - 4 days  Certification:: I certify this patient will need inpatient services for at least 2 midnights  PT Class (Do Not Modify): Inpatient [101]  PT Acc Code (Do Not Modify): Private [1]       Medical History Past Medical History:  Diagnosis Date  . Complication of anesthesia    had complications with "gas buildup" after tubal lig  . Dyslipidemia   . 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 Peripheral neuropathy   . SVC syndrome   . Thrombus 93/81/8299   Left basilic vein,  Left subclavian vein    Allergies Allergies  Allergen Reactions  . Promethazine Hcl Other (See Comments)    Pt reports feeling shaky and "sick" after phenergan    IV Location/Drains/Wounds Patient Lines/Drains/Airways Status   Active Line/Drains/Airways    Name:   Placement date:   Placement time:   Site:   Days:   Peripheral IV 05/08/19 Right Antecubital   05/08/19    0945    Antecubital   60   Incision (Closed) 07/25/16 Abdomen   07/25/16    0741     1077   Incision - 4 Ports Abdomen  Umbilicus Superior Right;Upper Right;Lower   07/25/16    0739     1077          Labs/Imaging Results for orders placed or performed during the hospital encounter of 07/07/19 (from the past 48 hour(s))  Basic metabolic panel     Status: Abnormal   Collection Time: 07/07/19  1:22 PM  Result Value Ref Range   Sodium 140 135 - 145 mmol/L   Potassium 3.9 3.5 - 5.1 mmol/L   Chloride 104 98 - 111 mmol/L   CO2 25 22 - 32 mmol/L   Glucose, Bld 123 (H) 70 - 99 mg/dL   BUN 15 8 - 23 mg/dL   Creatinine, Ser 0.72 0.44 - 1.00 mg/dL   Calcium 9.3 8.9 - 10.3 mg/dL   GFR calc non Af Amer >60 >60 mL/min   GFR calc Af Amer >60 >60 mL/min   Anion gap 11 5 - 15    Comment: Performed at [facility], [place] [address] [place], [place] [address]  CBC with Differential     Status: Abnormal   Collection Time: 07/07/19  1:22 PM  Result Value Ref Range   WBC 11.3 (H) 4.0 - 10.5 K/uL   RBC 4.19 3.87 -  5.11 MIL/uL   Hemoglobin 12.8 12.0 - 15.0 g/dL   HCT 38.3 36.0 - 46.0 %   MCV 91.4 80.0 - 100.0 fL   MCH 30.5 26.0 - 34.0 pg   MCHC 33.4 30.0 - 36.0 g/dL   RDW 13.2 11.5 - 15.5 %   Platelets 242 150 - 400 K/uL   nRBC 0.0 0.0 - 0.2 %   Neutrophils Relative % 83 %   Neutro Abs 9.3 (H) 1.7 - 7.7 K/uL   Lymphocytes Relative 11 %   Lymphs Abs 1.3 0.7 - 4.0 K/uL   Monocytes Relative 6 %   Monocytes Absolute 0.7 0.1 - 1.0 K/uL   Eosinophils Relative 0 %   Eosinophils Absolute 0.0 0.0 - 0.5 K/uL   Basophils Relative 0 %   Basophils Absolute 0.0 0.0 - 0.1 K/uL   Immature Granulocytes 0 %   Abs Immature Granulocytes 0.05 0.00 - 0.07 K/uL    Comment: Performed at [facility], [place] [address] [place], [place] [address]  Urinalysis, Routine w reflex microscopic     Status: Abnormal   Collection Time: 07/07/19  1:22 PM  Result Value Ref Range   Color, Urine STRAW (A) YELLOW   APPearance CLEAR CLEAR   Specific Gravity, Urine 1.010 1.005 - 1.030   pH 6.0 5.0 - 8.0    Glucose, UA NEGATIVE NEGATIVE mg/dL   Hgb urine dipstick NEGATIVE NEGATIVE   Bilirubin Urine NEGATIVE NEGATIVE   Ketones, ur NEGATIVE NEGATIVE mg/dL   Protein, ur NEGATIVE NEGATIVE mg/dL   Nitrite NEGATIVE NEGATIVE   Leukocytes,Ua NEGATIVE NEGATIVE    Comment: Performed at [place] [address] [place], [place] [address]  SARS Coronavirus 2 [facility] order, Performed in [facility] hospital lab) Nasopharyngeal Nasopharyngeal Swab     Status: None   Collection Time: 07/07/19  6:44 PM   Specimen: Nasopharyngeal Swab  Result Value Ref Range   SARS Coronavirus 2 NEGATIVE NEGATIVE    Comment: (NOTE) If result is NEGATIVE SARS-CoV-2 target nucleic acids are NOT DETECTED. The SARS-CoV-2 RNA is generally detectable in upper and lower  respiratory specimens during the acute phase of infection. The lowest  concentration of SARS-CoV-2 viral copies this assay can detect is 250  copies / mL. A negative result does not preclude SARS-CoV-2 infection  and should not be used as the sole basis for treatment or other  patient management decisions.  A negative result may occur with  improper specimen collection / handling, submission of specimen other  than nasopharyngeal swab, presence of viral mutation(s) within the  areas targeted by this assay, and inadequate number of viral copies  (<250 copies / mL). A negative result must be combined with clinical  observations, patient history, and epidemiological information. If result is POSITIVE SARS-CoV-2 target nucleic acids are DETECTED. The SARS-CoV-2 RNA is generally detectable in upper and lower  respiratory specimens dur ing the acute phase of infection.  Positive  results are indicative of active infection with SARS-CoV-2.  Clinical  correlation with patient history and other diagnostic information is  necessary to determine patient infection status.  Positive results do  not rule out bacterial infection or co-infection  with other viruses. If result is PRESUMPTIVE POSTIVE SARS-CoV-2 nucleic acids MAY BE PRESENT.   A presumptive positive result was obtained on the submitted specimen  and confirmed on repeat testing.  While 2019 novel coronavirus  (SARS-CoV-2) nucleic acids may be present in the submitted sample  additional confirmatory testing  may be necessary for epidemiological  and / or clinical management purposes  to differentiate between  SARS-CoV-2 and other Sarbecovirus currently known to infect humans.  If clinically indicated additional testing with an alternate test  methodology [phone]) is advised. The SARS-CoV-2 RNA is generally  detectable in upper and lower respiratory sp ecimens during the acute  phase of infection. The expected result is Negative. Fact Sheet for Patients:  StrictlyIdeas.no Fact Sheet for Healthcare Providers: BankingDealers.co.za This test is not yet approved or cleared by the [place] FDA and has been authorized for detection and/or diagnosis of SARS-CoV-2 by FDA under an Emergency Use Authorization (EUA).  This EUA will remain in effect (meaning this test can be used) for the duration of the COVID-19 declaration under Section 564(b)(1) of the Act, 21 U.S.C. section 360bbb-3(b)(1), unless the authorization is terminated or revoked sooner. Performed at [facility], [place] [address] [place], [place] [address]   CBC     Status: Abnormal   Collection Time: 07/07/19  9:12 PM  Result Value Ref Range   WBC 11.2 (H) 4.0 - 10.5 K/uL   RBC 4.14 3.87 - 5.11 MIL/uL   Hemoglobin 12.8 12.0 - 15.0 g/dL   HCT 37.6 36.0 - 46.0 %   MCV 90.8 80.0 - 100.0 fL   MCH 30.9 26.0 - 34.0 pg   MCHC 34.0 30.0 - 36.0 g/dL   RDW 13.1 11.5 - 15.5 %   Platelets 244 150 - 400 K/uL   nRBC 0.0 0.0 - 0.2 %    Comment: Performed at [facility], [place] [address] [place], [place] [address]   Dg Ribs  Unilateral W/chest Right  Result Date: 07/07/2019 CLINICAL DATA:  Pain after fall EXAM: RIGHT RIBS AND CHEST - 3+ VIEW COMPARISON:  None. FINDINGS: The cardiomediastinal silhouette is stable. Post radiation changes in the left perihilar region are stable. No pneumothorax. No nodules or masses. Comminuted fracture of the proximal humerus described on dedicated images. No obvious right rib fractures are identified. IMPRESSION: No rib fractures noted. Electronically Signed   By: Dorise Bullion III M.D   On: 07/07/2019 14:48   Dg Lumbar Spine Complete  Result Date: 07/07/2019 CLINICAL DATA:  Pain after fall EXAM: LUMBAR SPINE - COMPLETE 4+ VIEW COMPARISON:  None. FINDINGS: No malalignment. No fractures. Degenerative changes are seen at L4-5. Atherosclerotic changes are seen in the abdominal aorta. IMPRESSION: No acute abnormalities. Electronically Signed   By: Dorise Bullion III M.D   On: 07/07/2019 14:49   Mr Brain W Wo Contrast  Result Date: 07/07/2019 CLINICAL DATA:  Dizziness.  History of small cell lung cancer. EXAM: MRI HEAD WITHOUT AND WITH CONTRAST TECHNIQUE: Multiplanar, multiecho pulse sequences of the brain and surrounding structures were obtained without and with intravenous contrast. CONTRAST:  Gadavist 7.5 mL. COMPARISON:  MRI brain 11/26/2010. FINDINGS: Brain: Two intracranial mass lesions are present with abnormal enhancement and central necrosis. The smaller lesion is in the superior vermis, approximately 1.5 x 2 x 2 cm. The larger lesion is in the midline to slight inferior vermis extending into the RIGHT cerebellar hemisphere, approximately 2.5 x 3 x 3 cm. There is moderate surrounding edema. And effacement of the fourth ventricle. Elsewhere there is atrophy with prominence of the cisterns and sulci. Ventriculomegaly is favored to represent central atrophy rather than obstructive hydrocephalus. Vascular: Normal flow voids. Skull and upper cervical spine: Normal marrow signal.  Sinuses/Orbits: Chronic sphenoid sinusitis.  Negative orbits. Other: Unremarkable. 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 There is moderate surrounding edema and effacement the fourth ventricle. Untreated, the patient is at risk for obstructive hydrocephalus. Neurosurgical consultation may be warranted. These results were called by telephone at the time of interpretation on 07/07/2019 at 5:44 pm to Dr. Eulis Foster who verbally acknowledged these results. Electronically Signed   By: Staci Righter M.D.   On: 07/07/2019 17:45   Dg Shoulder Left  Result Date: 07/07/2019 CLINICAL DATA:  Pain after fall. EXAM: LEFT SHOULDER - 2+ VIEW COMPARISON:  None. FINDINGS: There is a comminuted fracture through the humeral head and neck without significant displacement. No dislocation identified. Clavicle and scapular grossly intact. Limited views of the left chest are normal. IMPRESSION: Comminuted fracture of the humeral head and neck without significant displacement or dislocation. Electronically Signed   By: Dorise Bullion III M.D   On: 07/07/2019 14:45   Dg Humerus Left  Result Date: 07/07/2019 CLINICAL DATA:  Pain after fall EXAM: LEFT HUMERUS - 2+ VIEW COMPARISON:  None. FINDINGS: Again noted is a nondisplaced comminuted fracture of the left humeral head and neck. The remainder of the humerus is intact. IMPRESSION: Nondisplaced comminuted fracture of the left humeral head and neck. The remainder of the humerus is intact. Electronically Signed   By: Dorise Bullion III M.D   On: 07/07/2019 14:46    Pending Labs Unresulted Labs (From admission, onward)    Start     Ordered   07/14/19 0500  Creatinine, serum  (enoxaparin (LOVENOX)    CrCl >/= 30 ml/min)  Weekly,   R    Comments: while on enoxaparin therapy    07/07/19 2117   07/07/19 2117  Creatinine, serum  (enoxaparin (LOVENOX)    CrCl >/= 30 ml/min)  Once,   STAT    Comments: Baseline for enoxaparin  therapy IF NOT ALREADY DRAWN.    07/07/19 2117   07/07/19 2112  HIV antibody (Routine Testing)  Once,   STAT     07/07/19 2117          Vitals/Pain Today's Vitals   07/07/19 1900 07/07/19 2100 07/07/19 2130 07/07/19 2200  BP: (!) 141/73 130/75 115/80 127/73  Pulse: 79 86 88 82  Resp: 18 18 18 16   Temp:      TempSrc:      SpO2: 98% 98% 97% 96%  Weight:      Height:      PainSc:        Isolation Precautions No active isolations  Medications Medications  acetaminophen (TYLENOL) tablet 650 mg (650 mg Oral Given 07/07/19 2149)    Or  acetaminophen (TYLENOL) suppository 650 mg ( Rectal See Alternative 07/07/19 2149)  traMADol (ULTRAM) tablet 50 mg (has no administration in time range)  senna (SENOKOT) tablet 8.6 mg (has no administration in time range)  enoxaparin (LOVENOX) injection 40 mg (has no administration in time range)  gadobutrol (GADAVIST) 1 MMOL/ML injection 7.5 mL (7.5 mLs Intravenous Contrast Given 07/07/19 1621)  dexamethasone (DECADRON) injection 10 mg (10 mg Intravenous Given 07/07/19 1944)    Mobility walks

## 2019-07-07 NOTE — ED Provider Notes (Signed)
[facility] DEPT Provider Note   CSN: [id number] Arrival date & time: 07/07/19  1222     History   Chief Complaint Chief Complaint  Patient presents with   Fall   Arm Pain    HPI Hailey Hill is a 68 y.o. female with history of dyslipidemia, small cell lung cancer in remission, peripheral neuropathy, SVC syndrome presents for evaluation of acute onset, intermittent dizziness with associated falls for around 1 week.  She is not entirely sure when her symptoms began but she reports she has had 4 falls since her symptoms started.  She endorses room spinning sensation and disequilibrium that primarily is brought on when going from a laying to standing position or with bending forward.  5 days ago she fell after turning to close a patio door, landing on her left shoulder.  Reports she has not been able to range the shoulder since then.  Pain in the left shoulder worsens with any movement.  She went to urgent care on Thursday for evaluation and reports that they wanted to order an MRI but has not been set up for this yet.  They did start her on meclizine which she has taken without relief of her symptoms.  She reports soreness to her head due to her falls but no known loss of consciousness, no vision changes, no numbness or weakness to the extremities.  She reports she tried to do the Epley maneuver in bed which made her symptoms worse.  She states that she has had vertigo back in 2005 but this feels a little bit different.  Denies fever, cough, chest pain, shortness of breath, abdominal pain, nausea, or vomiting.     The history is provided by the patient.    Past Medical History:  Diagnosis Date   Complication of anesthesia    had complications with "gas buildup" after tubal lig   Dyslipidemia    Lung cancer ([place]) dx'd 07/2010   sm cell    Peripheral neuropathy    SVC syndrome    Thrombus 85/88/5027   Left basilic vein,  Left subclavian vein     Patient Active Problem List   Diagnosis Date Noted   Chest pain with moderate risk for cardiac etiology 12/14/2015   Small cell lung cancer ([place]) 09/16/2013    Past Surgical History:  Procedure Laterality Date   CERVICAL BIOPSY     CHOLECYSTECTOMY N/A 07/25/2016   Procedure: LAPAROSCOPIC CHOLECYSTECTOMY;  Surgeon: Coralie Keens, MD;  Location: [facility];  Service: General;  Laterality: N/A;   TONSILLECTOMY     TUBAL LIGATION       OB History   No obstetric history on file.      Home Medications    Prior to Admission medications   Not on File    Family History Family History  Problem Relation Age of Onset   Nephrolithiasis Son    Heart attack Father    Heart attack Paternal Uncle    Heart attack Paternal Grandfather     Social History Social History   Tobacco Use   Smoking status: Former Smoker    Quit date: 03/08/2010    Years since quitting: 9.3   Smokeless tobacco: Never Used  Substance Use Topics   Alcohol use: No   Drug use: No     Allergies   Promethazine hcl   Review of Systems Review of Systems  Constitutional: Negative for chills and fever.  Respiratory: Negative for shortness of  breath.   Cardiovascular: Negative for chest pain.  Gastrointestinal: Negative for abdominal pain, nausea and vomiting.  Musculoskeletal: Positive for arthralgias and back pain.  Neurological: Positive for dizziness and headaches. Negative for syncope, weakness and numbness.  All other systems reviewed and are negative.    Physical Exam Updated Vital Signs BP 130/78    Pulse 83    Temp 98.6 F (37 C) (Oral)    Resp 13    Ht 5\' 8"  (1.727 m)    Wt 68 kg    SpO2 90%    BMI 22.81 kg/m   Physical Exam Vitals signs and nursing note reviewed.  Constitutional:      General: She is not in acute distress.    Appearance: She is well-developed.  HENT:     Head: Normocephalic and atraumatic.  Eyes:     General:        Right eye:  No discharge.        Left eye: No discharge.     Conjunctiva/sclera: Conjunctivae normal.  Neck:     Musculoskeletal: Normal range of motion and neck supple.     Vascular: No JVD.     Trachea: No tracheal deviation.  Cardiovascular:     Rate and Rhythm: Normal rate and regular rhythm.  Pulmonary:     Effort: Pulmonary effort is normal.     Breath sounds: Normal breath sounds.  Abdominal:     General: Bowel sounds are normal. There is no distension.     Palpations: Abdomen is soft.  Musculoskeletal:        General: Tenderness present.     Comments: Ecchymosis to the anterior aspect of the left shoulder, right upper arm, left great toe.  Diffusely tender overlying the right shoulder, midline lumbar spine around L5-S1.  No deformity, crepitus, or step-off noted.  Patient has significant pain with range of motion of the left shoulder, and passive range of motion is limited.  No midline cervical spine tenderness or paracervical muscle tenderness.  Pelvis appears stable.  Skin:    General: Skin is warm and dry.     Findings: No erythema.  Neurological:     Mental Status: She is alert and oriented to person, place, and time.     Cranial Nerves: No cranial nerve deficit.     Sensory: No sensory deficit.     Gait: Gait abnormal.     Comments: Mental Status:  Alert, thought content appropriate, able to give a coherent history. Speech fluent without evidence of aphasia. Able to follow 2 step commands without difficulty.  Cranial Nerves:  II:  Peripheral visual fields grossly normal, pupils equal, round, reactive to light III,IV, VI: ptosis not present, extra-ocular motions intact bilaterally  V,VII: smile symmetric, facial light touch sensation equal VIII: hearing grossly normal to voice  X: uvula elevates symmetrically  XI: bilateral shoulder shrug symmetric and strong XII: midline tongue extension without fassiculations Motor:  Normal tone.  Left deltoid strength diminished secondary to  left shoulder pain, otherwise 5/5 strength of BUE and BLE major muscle groups including strong and equal grip strength and dorsiflexion/plantar flexion Sensory: light touch normal in all extremities. Cerebellar: Unable to perform finger-to-nose with upper extremities due to left shoulder pain.   Gait: Able to pull herself up to a standing position.  Has difficulty ambulating but unclear if this is due to dizziness or arm pain.   Psychiatric:        Behavior: Behavior normal.  ED Treatments / Results  Labs (all labs ordered are listed, but only abnormal results are displayed) Labs Reviewed  BASIC METABOLIC PANEL - Abnormal; Notable for the following components:      Result Value   Glucose, Bld 123 (*)    All other components within normal limits  CBC WITH DIFFERENTIAL/PLATELET - Abnormal; Notable for the following components:   WBC 11.3 (*)    Neutro Abs 9.3 (*)    All other components within normal limits  URINALYSIS, ROUTINE W REFLEX MICROSCOPIC    EKG EKG Interpretation  Date/Time:  Sunday July 07 2019 14:54:11 EDT Ventricular Rate:  83 PR Interval:    QRS Duration: 121 QT Interval:  395 QTC Calculation: 462 R Axis:   71 Text Interpretation:  Sinus rhythm Nonspecific intraventricular conduction delay Artifact Abnormal ECG Confirmed by Carmin Muskrat [phone]) on 07/07/2019 3:01:59 PM   Radiology Dg Ribs Unilateral W/chest Right  Result Date: 07/07/2019 CLINICAL DATA:  Pain after fall EXAM: RIGHT RIBS AND CHEST - 3+ VIEW COMPARISON:  None. FINDINGS: The cardiomediastinal silhouette is stable. Post radiation changes in the left perihilar region are stable. No pneumothorax. No nodules or masses. Comminuted fracture of the proximal humerus described on dedicated images. No obvious right rib fractures are identified. IMPRESSION: No rib fractures noted. Electronically Signed   By: Dorise Bullion III M.D   On: 07/07/2019 14:48   Dg Lumbar Spine Complete  Result Date:  07/07/2019 CLINICAL DATA:  Pain after fall EXAM: LUMBAR SPINE - COMPLETE 4+ VIEW COMPARISON:  None. FINDINGS: No malalignment. No fractures. Degenerative changes are seen at L4-5. Atherosclerotic changes are seen in the abdominal aorta. IMPRESSION: No acute abnormalities. Electronically Signed   By: Dorise Bullion III M.D   On: 07/07/2019 14:49   Mr Brain W Wo Contrast  Result Date: 07/07/2019 CLINICAL DATA:  Dizziness.  History of small cell lung cancer. EXAM: MRI HEAD WITHOUT AND WITH CONTRAST TECHNIQUE: Multiplanar, multiecho pulse sequences of the brain and surrounding structures were obtained without and with intravenous contrast. CONTRAST:  Gadavist 7.5 mL. COMPARISON:  MRI brain 11/26/2010. FINDINGS: Brain: Two intracranial mass lesions are present with abnormal enhancement and central necrosis. The smaller lesion is in the superior vermis, approximately 1.5 x 2 x 2 cm. The larger lesion is in the midline to slight inferior vermis extending into the RIGHT cerebellar hemisphere, approximately 2.5 x 3 x 3 cm. There is moderate surrounding edema. And effacement of the fourth ventricle. Elsewhere there is atrophy with prominence of the cisterns and sulci. Ventriculomegaly is favored to represent central atrophy rather than obstructive hydrocephalus. Vascular: Normal flow voids. Skull and upper cervical spine: Normal marrow signal. Sinuses/Orbits: Chronic sphenoid sinusitis.  Negative orbits. Other: Unremarkable. 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 There is moderate surrounding edema and effacement the fourth ventricle. Untreated, the patient is at risk for obstructive hydrocephalus. Neurosurgical consultation may be warranted. These results were called by telephone at the time of interpretation on 07/07/2019 at 5:44 pm to Dr. Eulis Foster who verbally acknowledged these results. Electronically Signed   By: Staci Righter M.D.   On: 07/07/2019 17:45    Dg Shoulder Left  Result Date: 07/07/2019 CLINICAL DATA:  Pain after fall. EXAM: LEFT SHOULDER - 2+ VIEW COMPARISON:  None. FINDINGS: There is a comminuted fracture through the humeral head and neck without significant displacement. No dislocation identified. Clavicle and scapular grossly intact. Limited views of the left chest are normal. IMPRESSION: Comminuted fracture  of the humeral head and neck without significant displacement or dislocation. Electronically Signed   By: Dorise Bullion III M.D   On: 07/07/2019 14:45   Dg Humerus Left  Result Date: 07/07/2019 CLINICAL DATA:  Pain after fall EXAM: LEFT HUMERUS - 2+ VIEW COMPARISON:  None. FINDINGS: Again noted is a nondisplaced comminuted fracture of the left humeral head and neck. The remainder of the humerus is intact. IMPRESSION: Nondisplaced comminuted fracture of the left humeral head and neck. The remainder of the humerus is intact. Electronically Signed   By: Dorise Bullion III M.D   On: 07/07/2019 14:46    Procedures Procedures (including critical care time)  Medications Ordered in ED Medications  gadobutrol (GADAVIST) 1 MMOL/ML injection 7.5 mL (7.5 mLs Intravenous Contrast Given 07/07/19 1621)     Initial Impression / Assessment and Plan / ED Course  I have reviewed the triage vital signs and the nursing notes.  Pertinent labs & imaging results that were available during my care of the patient were reviewed by me and considered in my medical decision making (see chart for details).        Patient presenting for evaluation of intermittent dizziness, frequent falls, left shoulder pain secondary to fall on Wednesday.  She is afebrile, vital signs are stable.  She is nontoxic in appearance.  She is neurovascularly intact.  She has a history of small cell lung cancer.  She has significant difficulty ambulating on my assessment secondary to both dizziness and right arm pain.  Will obtain radiographs, blood work, and MRI of the  brain to evaluate for CVA versus metastatic lesion.  Lumbar reviewed by me shows mild nonspecific leukocytosis, no anemia, no metabolic arrangements, no renal insufficiency.  EKG shows normal sinus rhythm.  Radiographs significant for comminuted fracture of the humeral head and neck without significant displacement or dislocation.  Will place in shoulder sling for comfort and give pain medicine.  5:05 PM Signed out to oncoming provider PA [place].  Pending MRI, however I did receive a phone call from the MRI technician that she noted a lesion to the cerebellum and requested contrast for further evaluation.  If the patient has metastatic lesions or evidence of acute CVA that she will likely require admission to the hospital for further evaluation and management or at least neurosurgical consultation.  Patient was seen and evaluate by Dr. Vanita Panda who agrees with assessment and plan at this time  Final Clinical Impressions(s) / ED Diagnoses   Final diagnoses:  Multiple lesions of metastatic malignancy [facility])  Closed fracture of head of left humerus, initial encounter    ED Discharge Orders    None       Debroah Baller 07/07/19 1800    Carmin Muskrat, MD 07/08/19 1623

## 2019-07-07 NOTE — ED Triage Notes (Signed)
EMS reports from home, fall on Thursday and multiple falls since, c/o left arm pain since last fall. Went to Urgent Care Thursday for dizziness and given Meclizine. Pt states dizziness and nausea since Thursday as well. Denies LOC.  BP 138/76 HR 90 RR 16 Sp02 97 RA   20 RAC 154mcg Fentanyl

## 2019-07-07 NOTE — H&P (Signed)
History and Physical    NIMCO BIVENS JME:[id number] DOB: 08-24-1951 DOA: 07/07/2019  PCP: [name], NP (Confirm with patient/family/NH records and if not entered, this has to be entered at [facility] point of entry) Patient coming from: Patient is coming from home  I have personally briefly reviewed patient's old medical records in [place]  Chief Complaint: Multiple falls with 3 to her left arm  HPI: Hailey Hill is a 68 y.o. female with medical history significant of small cell carcinoma of the lung diagnosed in 2011.  She is status post multiple courses of chemotherapy.  She has been followed by Dr. Earlie Server with last office visit May 15, 2019.  At that time she was considered to be in remission.  CT the chest showed no progressive or recurrent lesions.  Patient reports in the last 7-10 days she has had 7 falls including a fall the day of admission that resulted in sudden pain in her left arm. She describes a loss of balance and loss of control of movement but no loss of consciousness.  She presented to the emergency department for evaluation.  X-ray of the left humerus revealed a nondisplaced fracture of humeral neck and head.  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  Patient now admitted for pain control, PT/OT eval and for the initiation of steroid treatment for brain metastases with surrounding edema.   ED Course: Patient was hemodynamically stable.  Evaluation did reveal fracture of the left humeral neck and head.  MRI did confirm presence of metastatic lesions to the brain.  The tech placed the patient's left arm in a splint.  Patient was referred for admission  Review of Systems: As per HPI otherwise 10 point review of systems negative.    Past Medical History:  Diagnosis Date  . Complication of anesthesia    had complications with "gas buildup" after tubal lig  . Dyslipidemia   . Lung cancer ([place])  dx'd 07/2010   sm cell   . Peripheral neuropathy   . SVC syndrome   . Thrombus 22/97/9892   Left basilic vein,  Left subclavian vein    Past Surgical History:  Procedure Laterality Date  . CERVICAL BIOPSY    . CHOLECYSTECTOMY N/A 07/25/2016   Procedure: LAPAROSCOPIC CHOLECYSTECTOMY;  Surgeon: Coralie Keens, MD;  Location: [place];  Service: General;  Laterality: N/A;  . TONSILLECTOMY    . TUBAL LIGATION     Soc Hx - married. 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 Her husband continues to work at age 39. A son, 4 grandchildren. She lives with her husband and has been independent in ADLs.  She has never considered EoL issues or code status. Discussed this with her and referred her to [email or web address]   reports that she quit smoking about 9 years ago. She has never used smokeless tobacco. She reports that she does not drink alcohol or use drugs.  Allergies  Allergen Reactions  . Promethazine Hcl Other (See Comments)    Pt reports feeling shaky and "sick" after phenergan    Family History  Problem Relation Age of Onset  . Nephrolithiasis Son   . Heart attack Father   . Heart attack Paternal Uncle   . Heart attack Paternal Grandfather      Prior to Admission medications   Not on File    Physical Exam:  Vitals:   07/07/19 1707 07/07/19 1800 07/07/19 1900 07/07/19 2100  BP: 130/78 (!) 142/84 (!) 141/73 130/75  Pulse: 83 81 79 86  Resp: 13 18 18 18   Temp:      TempSrc:      SpO2: 90% 98% 98% 98%  Weight:      Height:        Constitutional: NAD, calm, comfortable Vitals:   07/07/19 1707 07/07/19 1800 07/07/19 1900 07/07/19 2100  BP: 130/78 (!) 142/84 (!) 141/73 130/75  Pulse: 83 81 79 86  Resp: 13 18 18 18   Temp:      TempSrc:      SpO2: 90% 98% 98% 98%  Weight:      Height:       General appearance: Pleasant woman in no acute distress, a good historian. Eyes: PERRL, lids and  conjunctivae normal ENMT: Mucous membranes are moist. Posterior pharynx clear of any exudate or lesions.Normal dentition.  Neck: normal, supple, no masses, no thyromegaly Respiratory: clear to auscultation bilaterally, no wheezing, no crackles. Normal respiratory effort. No accessory muscle use.  Cardiovascular: Regular rate and rhythm, no murmurs / rubs / gallops. No extremity edema. 2+ pedal pulses. No carotid bruits.  Abdomen: no tenderness, no masses palpated. No hepatosplenomegaly. Bowel sounds positive.  Musculoskeletal: no clubbing / cyanosis. No joint deformity upper and lower extremities. Good ROM, no contractures. Normal muscle tone.  Skin: no rashes, lesions, ulcers. No induration Neurologic: CN 2-12 grossly intact. Sensation intact,Strength 5/5 in all 4. No tremor, normal finger to nose. Psychiatric: Normal judgment and insight. Alert and oriented x 3. Normal mood.     Labs on Admission: I have personally reviewed following labs and imaging studies  CBC: Recent Labs  Lab 07/07/19 1322  WBC 11.3*  NEUTROABS 9.3*  HGB 12.8  HCT 38.3  MCV 91.4  PLT 814   Basic Metabolic Panel: Recent Labs  Lab 07/07/19 1322  NA 140  K 3.9  CL 104  CO2 25  GLUCOSE 123*  BUN 15  CREATININE 0.72  CALCIUM 9.3   GFR: Estimated Creatinine Clearance: 69.8 mL/min (by C-G formula based on SCr of 0.72 mg/dL). Liver Function Tests: No results for input(s): AST, ALT, ALKPHOS, BILITOT, PROT, ALBUMIN in the last 168 hours. No results for input(s): LIPASE, AMYLASE in the last 168 hours. No results for input(s): AMMONIA in the last 168 hours. Coagulation Profile: No results for input(s): INR, PROTIME in the last 168 hours. Cardiac Enzymes: No results for input(s): CKTOTAL, CKMB, CKMBINDEX, TROPONINI in the last 168 hours. BNP (last 3 results) No results for input(s): PROBNP in the last 8760 hours. HbA1C: No results for input(s): HGBA1C in the last 72 hours. CBG: No results for input(s):  GLUCAP in the last 168 hours. Lipid Profile: No results for input(s): CHOL, HDL, LDLCALC, TRIG, CHOLHDL, LDLDIRECT in the last 72 hours. Thyroid Function Tests: No results for input(s): TSH, T4TOTAL, FREET4, T3FREE, THYROIDAB in the last 72 hours. Anemia Panel: No results for input(s): VITAMINB12, FOLATE, FERRITIN, TIBC, IRON, RETICCTPCT in the last 72 hours. Urine analysis:    Component Value Date/Time   COLORURINE STRAW (A) 07/07/2019 1322   APPEARANCEUR CLEAR 07/07/2019 1322   LABSPEC 1.010 07/07/2019 1322   PHURINE 6.0 07/07/2019 1322   GLUCOSEU NEGATIVE 07/07/2019 1322   HGBUR NEGATIVE 07/07/2019 1322   BILIRUBINUR NEGATIVE 07/07/2019 1322   KETONESUR NEGATIVE 07/07/2019 1322   PROTEINUR NEGATIVE 07/07/2019 1322   NITRITE NEGATIVE 07/07/2019 1322   LEUKOCYTESUR NEGATIVE 07/07/2019  [place] on Admission: Dg Ribs Unilateral W/chest Right  Result Date: 07/07/2019 CLINICAL DATA:  Pain after fall EXAM: RIGHT RIBS AND CHEST - 3+ VIEW COMPARISON:  None. FINDINGS: The cardiomediastinal silhouette is stable. Post radiation changes in the left perihilar region are stable. No pneumothorax. No nodules or masses. Comminuted fracture of the proximal humerus described on dedicated images. No obvious right rib fractures are identified. IMPRESSION: No rib fractures noted. Electronically Signed   By: Dorise Bullion III M.D   On: 07/07/2019 14:48   Dg Lumbar Spine Complete  Result Date: 07/07/2019 CLINICAL DATA:  Pain after fall EXAM: LUMBAR SPINE - COMPLETE 4+ VIEW COMPARISON:  None. FINDINGS: No malalignment. No fractures. Degenerative changes are seen at L4-5. Atherosclerotic changes are seen in the abdominal aorta. IMPRESSION: No acute abnormalities. Electronically Signed   By: Dorise Bullion III M.D   On: 07/07/2019 14:49   Mr Brain W Wo Contrast  Result Date: 07/07/2019 CLINICAL DATA:  Dizziness.  History of small cell lung cancer. EXAM: MRI HEAD WITHOUT AND WITH  CONTRAST TECHNIQUE: Multiplanar, multiecho pulse sequences of the brain and surrounding structures were obtained without and with intravenous contrast. CONTRAST:  Gadavist 7.5 mL. COMPARISON:  MRI brain 11/26/2010. FINDINGS: Brain: Two intracranial mass lesions are present with abnormal enhancement and central necrosis. The smaller lesion is in the superior vermis, approximately 1.5 x 2 x 2 cm. The larger lesion is in the midline to slight inferior vermis extending into the RIGHT cerebellar hemisphere, approximately 2.5 x 3 x 3 cm. There is moderate surrounding edema. And effacement of the fourth ventricle. Elsewhere there is atrophy with prominence of the cisterns and sulci. Ventriculomegaly is favored to represent central atrophy rather than obstructive hydrocephalus. Vascular: Normal flow voids. Skull and upper cervical spine: Normal marrow signal. Sinuses/Orbits: Chronic sphenoid sinusitis.  Negative orbits. Other: Unremarkable. 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 There is moderate surrounding edema and effacement the fourth ventricle. Untreated, the patient is at risk for obstructive hydrocephalus. Neurosurgical consultation may be warranted. These results were called by telephone at the time of interpretation on 07/07/2019 at 5:44 pm to Dr. Eulis Foster who verbally acknowledged these results. Electronically Signed   By: Staci Righter M.D.   On: 07/07/2019 17:45   Dg Shoulder Left  Result Date: 07/07/2019 CLINICAL DATA:  Pain after fall. EXAM: LEFT SHOULDER - 2+ VIEW COMPARISON:  None. FINDINGS: There is a comminuted fracture through the humeral head and neck without significant displacement. No dislocation identified. Clavicle and scapular grossly intact. Limited views of the left chest are normal. IMPRESSION: Comminuted fracture of the humeral head and neck without significant displacement or dislocation. Electronically Signed   By: Dorise Bullion  III M.D   On: 07/07/2019 14:45   Dg Humerus Left  Result Date: 07/07/2019 CLINICAL DATA:  Pain after fall EXAM: LEFT HUMERUS - 2+ VIEW COMPARISON:  None. FINDINGS: Again noted is a nondisplaced comminuted fracture of the left humeral head and neck. The remainder of the humerus is intact. IMPRESSION: Nondisplaced comminuted fracture of the left humeral head and neck. The remainder of the humerus is intact. Electronically Signed   By: Dorise Bullion III M.D   On: 07/07/2019 14:46    EKG: Independently reviewed. NSR, non-specific intraventricular conduction delay, no acute changes  Assessment/Plan Active Problems:   Metastatic lung cancer (metastasis from lung to other site) [facility])   Fracture, humerus, anatomical neck, left, closed, initial encounter  Small cell lung cancer ([place])   Metastatic cancer to brain [facility])  (please populate well all problems here in Problem List. (For example, if patient is on BP meds at home and you resume or decide to hold them, it is a problem that needs to be her. Same for CAD, COPD, HLD and so on)   1. Brain mets - right location to cause symptoms of imbalance with falls. Significant edema noted. Most likely from lung cancer but may be other source. Plan Med-surg admit  Decadron 4 mg qid  Consult Dr. Earlie Server in AM to direct further work-up and treatment plan  2. Fractured Humerus left  - non-displaced. ED provider placed in sling. Plan Asked ED team to make the call on consulting ortho or not.    DVT prophylaxis: lovenox (Lovenox/Heparin/SCD's/anticoagulated/None (if comfort care) Code Status: full code (Full/Partial (specify details) Family Communication: spoke with spouse explaining dx and tx plan. Answered all questions (Specify name, relationship. Do not write "discussed with patient". Specify tel # if discussed over the phone) Disposition Plan: home when stable 2-4 days (specify when and where you expect patient to be discharged) Consults called:  oncology - will contact Dr. Earlie Server in AM (with names) Admission status: inpatient (inpatient / obs / tele / medical floor / SDU)   Adella Hare MD [facility] Pager [phone]  If 7PM-7AM, please contact night-coverage [email or web address] Password [email or web address]  07/07/2019, 9:20 PM

## 2019-07-08 ENCOUNTER — Ambulatory Visit
Admit: 2019-07-08 | Discharge: 2019-07-08 | Disposition: A | Discharge status: Home / Self Care | Payer: Medicare HMO | Attending: Radiation Oncology | Admitting: Radiation Oncology

## 2019-07-08 DIAGNOSIS — C7931 Secondary malignant neoplasm of brain: Principal | ICD-10-CM

## 2019-07-08 DIAGNOSIS — C799 Secondary malignant neoplasm of unspecified site: Secondary | ICD-10-CM

## 2019-07-08 DIAGNOSIS — C3490 Malignant neoplasm of unspecified part of unspecified bronchus or lung: Secondary | ICD-10-CM

## 2019-07-08 NOTE — Progress Notes (Signed)
HEMATOLOGY-ONCOLOGY PROGRESS NOTE  SUBJECTIVE: Hailey Hill is a 68 year old female with a history of small cell lung cancer presenting with SVC syndrome diagnosed in September 2011.  Prior treatment included 6 cycles of chemotherapy with cisplatin and etoposide given concurrently with radiation to her chest.  She then underwent prophylactic whole brain radiation.  She completed treatment in January 2012.  The patient has been on observation since this time.  She was last seen by medical oncology in July 2020.  She had a restaging CT scan of the chest which showed no concerning findings for disease recurrence.  The patient is now admitted following a one-week history of increasing dizziness and falls.  She denies having any headaches or vision changes.  She has no other symptoms including chest pain, shortness of breath, cough, hemoptysis.  Today, she does complain of pain to her left arm as well as her back following her recent falls.  MRI of the brain performed on admission showed 2 intracranial metastatic lesions involving the cerebellar vermis with surrounding edema and effacement of the fourth ventricle.  She has been started on dexamethasone 4 mg 4 times a day.  She also had x-rays of her left shoulder and humerus, ribs, and lumbar spine.  X-rays of the spine and ribs did not show any fracture.  X-ray of the left humerus showed a nondisplaced comminuted fracture of the left humeral head and neck and x-ray of the left shoulder showed comminuted fracture of the humeral head and neck without significant displacement or dislocation.  REVIEW OF SYSTEMS:   Constitutional: Denies fevers, chills or abnormal weight loss Eyes: Denies blurriness of vision Ears, nose, mouth, throat, and face: Denies mucositis or sore throat Respiratory: Denies cough, dyspnea or wheezes Cardiovascular: Denies palpitation, chest discomfort Gastrointestinal:  Denies nausea, heartburn or change in bowel habits Skin: Denies abnormal  skin rashes Lymphatics: Denies new lymphadenopathy or easy bruising Neurological: Reports dizziness and feeling off balance Behavioral/Psych: Mood is stable, no new changes  Extremities: No lower extremity edema All other systems were reviewed with the patient and are negative.  I have reviewed the past medical history, past surgical history, social history and family history with the patient and they are unchanged from previous note.   PHYSICAL EXAMINATION: ECOG PERFORMANCE STATUS: 1 - Symptomatic but completely ambulatory  Vitals:   07/08/19 0559 07/08/19 0603  BP: 135/89 126/74  Pulse: 80 88  Resp:    Temp:    SpO2: 97% 99%   Filed Weights   07/07/19 1235  Weight: 150 lb (68 kg)    Intake/Output from previous day: 08/30 0701 - 08/31 0700 In: -  Out: 350 [Urine:350]  GENERAL:alert, no distress and comfortable SKIN: Multiple ecchymotic areas to her bilateral arms EYES: normal, Conjunctiva are pink and non-injected, sclera clear OROPHARYNX:no exudate, no erythema and lips, buccal mucosa, and tongue normal  LYMPH:  no palpable lymphadenopathy in the cervical, axillary or inguinal LUNGS: clear to auscultation and percussion with normal breathing effort HEART: regular rate & rhythm and no murmurs and no lower extremity edema ABDOMEN:abdomen soft, non-tender and normal bowel sounds Musculoskeletal: Left arm in a sling NEURO: alert & oriented x 3 with fluent speech, no focal motor/sensory deficits  LABORATORY DATA:  I have reviewed the data as listed CMP Latest Ref Rng & Units 07/07/2019 07/07/2019 05/08/2019  Glucose 70 - 99 mg/dL - 123(H) 95  BUN 8 - 23 mg/dL - 15 10  Creatinine 0.44 - 1.00 mg/dL 0.69 0.72 0.90  Sodium  135 - 145 mmol/L - 140 140  Potassium 3.5 - 5.1 mmol/L - 3.9 4.7  Chloride 98 - 111 mmol/L - 104 105  CO2 22 - 32 mmol/L - 25 26  Calcium 8.9 - 10.3 mg/dL - 9.3 9.2  Total Protein 6.5 - 8.1 g/dL - - 6.7  Total Bilirubin 0.3 - 1.2 mg/dL - - 0.3  Alkaline  Phos 38 - 126 U/L - - 94  AST 15 - 41 U/L - - 13(L)  ALT 0 - 44 U/L - - 12    Lab Results  Component Value Date   WBC 11.2 (H) 07/07/2019   HGB 12.8 07/07/2019   HCT 37.6 07/07/2019   MCV 90.8 07/07/2019   PLT 244 07/07/2019   NEUTROABS 9.3 (H) 07/07/2019    Dg Ribs Unilateral W/chest Right  Result Date: 07/07/2019 CLINICAL DATA:  Pain after fall EXAM: RIGHT RIBS AND CHEST - 3+ VIEW COMPARISON:  None. FINDINGS: The cardiomediastinal silhouette is stable. Post radiation changes in the left perihilar region are stable. No pneumothorax. No nodules or masses. Comminuted fracture of the proximal humerus described on dedicated images. No obvious right rib fractures are identified. IMPRESSION: No rib fractures noted. Electronically Signed   By: Dorise Bullion III M.D   On: 07/07/2019 14:48   Dg Lumbar Spine Complete  Result Date: 07/07/2019 CLINICAL DATA:  Pain after fall EXAM: LUMBAR SPINE - COMPLETE 4+ VIEW COMPARISON:  None. FINDINGS: No malalignment. No fractures. Degenerative changes are seen at L4-5. Atherosclerotic changes are seen in the abdominal aorta. IMPRESSION: No acute abnormalities. Electronically Signed   By: Dorise Bullion III M.D   On: 07/07/2019 14:49   Mr Brain W Wo Contrast  Result Date: 07/07/2019 CLINICAL DATA:  Dizziness.  History of small cell lung cancer. EXAM: MRI HEAD WITHOUT AND WITH CONTRAST TECHNIQUE: Multiplanar, multiecho pulse sequences of the brain and surrounding structures were obtained without and with intravenous contrast. CONTRAST:  Gadavist 7.5 mL. COMPARISON:  MRI brain 11/26/2010. FINDINGS: Brain: Two intracranial mass lesions are present with abnormal enhancement and central necrosis. The smaller lesion is in the superior vermis, approximately 1.5 x 2 x 2 cm. The larger lesion is in the midline to slight inferior vermis extending into the RIGHT cerebellar hemisphere, approximately 2.5 x 3 x 3 cm. There is moderate surrounding edema. And effacement of  the fourth ventricle. Elsewhere there is atrophy with prominence of the cisterns and sulci. Ventriculomegaly is favored to represent central atrophy rather than obstructive hydrocephalus. Vascular: Normal flow voids. Skull and upper cervical spine: Normal marrow signal. Sinuses/Orbits: Chronic sphenoid sinusitis.  Negative orbits. Other: Unremarkable. 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 There is moderate surrounding edema and effacement the fourth ventricle. Untreated, the patient is at risk for obstructive hydrocephalus. Neurosurgical consultation may be warranted. These results were called by telephone at the time of interpretation on 07/07/2019 at 5:44 pm to Dr. Eulis Foster who verbally acknowledged these results. Electronically Signed   By: Staci Righter M.D.   On: 07/07/2019 17:45   Dg Shoulder Left  Result Date: 07/07/2019 CLINICAL DATA:  Pain after fall. EXAM: LEFT SHOULDER - 2+ VIEW COMPARISON:  None. FINDINGS: There is a comminuted fracture through the humeral head and neck without significant displacement. No dislocation identified. Clavicle and scapular grossly intact. Limited views of the left chest are normal. IMPRESSION: Comminuted fracture of the humeral head and neck without significant displacement or dislocation. Electronically Signed   By: Shanon Brow  Jimmye Norman III M.D   On: 07/07/2019 14:45   Dg Humerus Left  Result Date: 07/07/2019 CLINICAL DATA:  Pain after fall EXAM: LEFT HUMERUS - 2+ VIEW COMPARISON:  None. FINDINGS: Again noted is a nondisplaced comminuted fracture of the left humeral head and neck. The remainder of the humerus is intact. IMPRESSION: Nondisplaced comminuted fracture of the left humeral head and neck. The remainder of the humerus is intact. Electronically Signed   By: Dorise Bullion III M.D   On: 07/07/2019 14:46    ASSESSMENT AND PLAN: 1.  History limited stage small cell lung cancer diagnosed 2011 2.  New  brain metastases 3.  Left humerus fracture  -Chart was reviewed with Dr. Julien Nordmann who recommends referral to radiation oncology for consideration of SRS to her new brain lesions. I have called Radiation Oncology to notify them of this consult.  -Continue dexamethasone 4 mg every 6 hours. -Recommend orthopedic evaluation for left humerus fracture. -Recommend PT/OT consult.    LOS: 1 day   Mikey Bussing, DNP, AGPCNP-BC, AOCNP 07/08/19

## 2019-07-08 NOTE — Progress Notes (Signed)
Rehab Admissions Coordinator Note:  Patient was screened by Michel Santee for appropriateness for an Inpatient Acute Rehab Consult.  At this time, we are recommending Inpatient Rehab consult.  Please place a consult order if pt would like to be considered.   Michel Santee 07/08/2019, 3:53 PM  I can be reached at [phone].

## 2019-07-08 NOTE — Evaluation (Signed)
Occupational Therapy Evaluation Patient Details Name: Hailey Hill MRN: [id number] DOB: Sep 01, 1951 Today's Date: 07/08/2019    History of Present Illness Pt is a 68 year old female admitted after mutliple falls at home and with Left arm pain.  X-ray of the left humerus revealed a nondisplaced fracture of humeral neck and head.  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  Pt with medical history significant of small cell carcinoma of the lung diagnosed in 2011.  She is status post multiple courses of chemotherapy   Clinical Impression   Pt admitted s/p fall with L arm pain. Pt currently with functional limitations due to the deficits listed below (see OT Problem List).  Pt will benefit from skilled OT to increase their safety and independence with ADL and functional mobility for ADL to facilitate discharge to venue listed below.   Pts husband works during the day and she must be mod I during day.  Ortho to see pt this day per pt.       Follow Up Recommendations  Home health OT;Follow surgeon's recommendation for DC plan and follow-up therapies;SNF(depending on husbands ability to A)    Equipment Recommendations  3 in 1 bedside commode    Recommendations for Other Services       Precautions / Restrictions Precautions Precautions: Fall Required Braces or Orthoses: Sling Restrictions Weight Bearing Restrictions: Yes LUE Weight Bearing: Non weight bearing Other Position/Activity Restrictions: verbally discussed with Dr. Wyline Copas and he reports f/u with outpatient ortho and keep NWB with sling      Mobility Bed Mobility Overal bed mobility: Needs Assistance Bed Mobility: Supine to Sit     Supine to sit: HOB elevated;Supervision     General bed mobility comments: Pt OOB  Transfers Overall transfer level: Needs assistance Equipment used: 1 person hand held assist Transfers: Sit to/from Stand Sit to Stand: Mod  assist         General transfer comment: assist to rise and steady, required use of R UE    Balance Overall balance assessment: History of Falls;Needs assistance         Standing balance support: Single extremity supported Standing balance-Leahy Scale: Poor Standing balance comment: requires UE support and external assist                           ADL either performed or assessed with clinical judgement   ADL Overall ADL's : Needs assistance/impaired Eating/Feeding: Set up;Sitting   Grooming: Set up;Sitting   Upper Body Bathing: Moderate assistance;Sitting   Lower Body Bathing: Maximal assistance;Sit to/from stand;Cueing for compensatory techniques;Cueing for safety   Upper Body Dressing : Moderate assistance;Sitting Upper Body Dressing Details (indicate cue type and reason): sling Lower Body Dressing: Maximal assistance;Sit to/from stand;Cueing for compensatory techniques;Cueing for safety                 General ADL Comments: OT session focused on repositioning sling in supported position.  Pts RUE painful with repositioning the sling.  Per pt ortho MD to see pt this day     Vision Baseline Vision/History: No visual deficits              Pertinent Vitals/Pain Pain Assessment: 0-10 Pain Score: 6  Pain Location: with repositioning the sling Pain Descriptors / Indicators: Sharp;Sore Pain Intervention(s): Limited activity within patient's tolerance;Monitored during session;Repositioned     Hand Dominance  Extremity/Trunk Assessment Upper Extremity Assessment Upper Extremity Assessment: LUE deficits/detail LUE Deficits / Details: remained in sling- repositoined sling and encouraged pt move hand and wrist   Lower Extremity Assessment Lower Extremity Assessment: Overall WFL for tasks assessed       Communication Communication Communication: No difficulties   Cognition Arousal/Alertness: Awake/alert Behavior During Therapy: WFL for  tasks assessed/performed Overall Cognitive Status: Within Functional Limits for tasks assessed                                                Home Living Family/patient expects to be discharged to:: Private residence Living Arrangements: Spouse/significant other   Type of Home: House Home Access: Stairs to enter [employer] of Steps: 2-6 (depending on entrance) Entrance Stairs-Rails: None Home Layout: One level               Home Equipment: None          Prior Functioning/Environment Level of Independence: Independent        Comments: admits to furniture walking with increased balance difficulty        OT Problem List: Decreased strength;Decreased range of motion;Decreased knowledge of use of DME or AE;Decreased knowledge of precautions;Impaired UE functional use;Pain      OT Treatment/Interventions: Self-care/ADL training;Patient/family education;DME and/or AE instruction    OT Goals(Current goals can be found in the care plan section) Acute Rehab OT Goals Patient Stated Goal: home and less pain in R shoulder OT Goal Formulation: With patient Time For Goal Achievement: 07/16/19  OT Frequency: Min 2X/week    AM-PAC OT "6 Clicks" Daily Activity     Outcome Measure Help from another person eating meals?: A Little Help from another person taking care of personal grooming?: A Little Help from another person toileting, which includes using toliet, bedpan, or urinal?: A Lot Help from another person bathing (including washing, rinsing, drying)?: A Little Help from another person to put on and taking off regular upper body clothing?: A Little Help from another person to put on and taking off regular lower body clothing?: A Lot 6 Click Score: 16   End of Session Equipment Utilized During Treatment: Other (comment)(sling RUE) Nurse Communication: Mobility status  Activity Tolerance: Patient limited by pain Patient left: in chair  OT  Visit Diagnosis: Unsteadiness on feet (R26.81);Other abnormalities of gait and mobility (R26.89);Muscle weakness (generalized) (M62.81);Pain;History of falling (Z91.81) Pain - part of body: Shoulder                Time: 0112-0126 OT Time Calculation (min): 14 min Charges:  OT General Charges $OT Visit: 1 Visit OT Evaluation $OT Eval Moderate Complexity: 1 Mod  Kari Baars, [place] [phone] Office- [phone], Edwena Felty D 07/08/2019, 2:29 PM

## 2019-07-08 NOTE — Consult Note (Signed)
Radiation Oncology         [phone] ________________________________  Initial inpatient Consultation  Name: Hailey Hill MRN: [id number]  Date: 07/07/2019  DOB: 11-21-1950  IZ:[id number], Joelene Millin, NP  No ref. provider found   REFERRING PHYSICIAN:  Curt Bears, MD  DIAGNOSIS: C79.31 Brain metastases  HISTORY OF PRESENT ILLNESS::Hailey Hill is a 68 y.o. female who is well known by me; she received radiotherapy in 2012 to the chest and prophylactically to the whole brain for Small cell lung cancer. She was in remission when, weeks ago, she noted dizziness and fullness in her ear. She started to fall and believed she has blacked out as she couldn't remember the actual act of falling on certain occasions.  She was admitted to the ED after a bad fall resulting in left humeral fracture. MRI of brain showed two large cerebellar lesions c/w metastases. The smaller lesion is in the superior vermis, approximately 1.5 x 2 x 2 cm. The larger lesion is in the midline to slight inferior vermis extending into the RIGHT cerebellar hemisphere, approximately 2.5 x 3 x 3 cm. There is moderate surrounding edema. And effacement of the fourth ventricle.  Patient is alert and oriented, an excellent historian. PET pending. She has not had an EEG. She was told her EKG was unremarkable. She is unaware of any seizures. She is on dexamethasone.   Chest CT 05-08-19 showed Stable left perihilar radiation fibrosis. No evidence of recurrent or metastatic disease in the chest.  PREVIOUS RADIATION THERAPY: Yes  60Gy/30 fractions to Chest completed 11/19/2010.   PCI 25 Gy/10 fractions whole brain completed on 12/20/2010.   PAST MEDICAL HISTORY:  has a past medical history of Complication of anesthesia, Dyslipidemia, Lung cancer ([place]) (dx'd 07/2010), Peripheral neuropathy, SVC syndrome, and Thrombus (10/21/2010).    PAST SURGICAL HISTORY: Past Surgical History:  Procedure Laterality Date   CERVICAL BIOPSY      CHOLECYSTECTOMY N/A 07/25/2016   Procedure: LAPAROSCOPIC CHOLECYSTECTOMY;  Surgeon: Coralie Keens, MD;  Location: [facility];  Service: General;  Laterality: N/A;   TONSILLECTOMY     TUBAL LIGATION      FAMILY HISTORY: family history includes Heart attack in her father, paternal grandfather, and paternal uncle; Nephrolithiasis in her son.  SOCIAL HISTORY:  reports that she quit smoking about 9 years ago. She has never used smokeless tobacco. She reports that she does not drink alcohol or use drugs.  ALLERGIES: Promethazine hcl  MEDICATIONS:  Current Facility-Administered Medications  Medication Dose Route Frequency Provider Last Rate Last Dose   acetaminophen (TYLENOL) tablet 650 mg  650 mg Oral Q6H PRN Neena Rhymes, MD   650 mg at 07/07/19 2149   Or   acetaminophen (TYLENOL) suppository 650 mg  650 mg Rectal Q6H PRN Norins, Heinz Knuckles, MD       dexamethasone (DECADRON) tablet 4 mg  4 mg Oral QID Norins, Heinz Knuckles, MD   4 mg at 07/08/19 1547   enoxaparin (LOVENOX) injection 40 mg  40 mg Subcutaneous QHS Norins, Heinz Knuckles, MD   40 mg at 07/07/19 2255   senna (SENOKOT) tablet 8.6 mg  1 tablet Oral BID Norins, Heinz Knuckles, MD       traMADol Veatrice Bourbon) tablet 50 mg  50 mg Oral Q6H PRN Neena Rhymes, MD   50 mg at 07/07/19 2255    REVIEW OF SYSTEMS:   As above   PHYSICAL EXAM:  height is 5\' 8"  (1.727 m) and weight is  150 lb (68 kg). Her oral temperature is 98.9 F (37.2 C). Her blood pressure is 115/74 and her pulse is 82. Her respiration is 17 and oxygen saturation is 97%.   General: Alert and oriented, in no acute distress HEENT: Head is normocephalic. Extraocular movements are intact.  No nystagmus.  Neurologic: Cranial nerves II through XII are grossly intact. Speech is fluent, no slurring. Coordination is intact with finger to nose testing. Psychiatric: Judgment and insight are intact. Affect is appropriate.  KPS = 80  100 - Normal; no complaints; no  evidence of disease. 90   - Able to carry on normal activity; minor signs or symptoms of disease. 80   - Normal activity with effort; some signs or symptoms of disease. 81   - Cares for self; unable to carry on normal activity or to do active work. 60   - Requires occasional assistance, but is able to care for most of his personal needs. 50   - Requires considerable assistance and frequent medical care. 15   - Disabled; requires special care and assistance. 41   - Severely disabled; hospital admission is indicated although death not imminent. 52   - Very sick; hospital admission necessary; active supportive treatment necessary. 10   - Moribund; fatal processes progressing rapidly. 0     - Dead  Karnofsky DA, Abelmann [place], Craver LS and [place] [phone]) The use of the nitrogen mustards in the palliative treatment of carcinoma: with particular reference to bronchogenic carcinoma Cancer 1 [phone]     LABORATORY DATA:  Lab Results  Component Value Date   WBC 11.2 (H) 07/07/2019   HGB 12.8 07/07/2019   HCT 37.6 07/07/2019   MCV 90.8 07/07/2019   PLT 244 07/07/2019   CMP     Component Value Date/Time   NA 140 07/07/2019 1322   NA 142 04/25/2017 0805   K 3.9 07/07/2019 1322   K 4.5 04/25/2017 0805   CL 104 07/07/2019 1322   CL 108 (H) 01/09/2013 0913   CO2 25 07/07/2019 1322   CO2 26 04/25/2017 0805   GLUCOSE 123 (H) 07/07/2019 1322   GLUCOSE 95 04/25/2017 0805   GLUCOSE 101 (H) 01/09/2013 0913   BUN 15 07/07/2019 1322   BUN 12.8 04/25/2017 0805   CREATININE 0.69 07/07/2019 2112   CREATININE 0.90 05/08/2019 0739   CREATININE 0.9 04/25/2017 0805   CALCIUM 9.3 07/07/2019 1322   CALCIUM 9.6 04/25/2017 0805   PROT 6.7 05/08/2019 0739   PROT 6.6 04/25/2017 0805   ALBUMIN 3.9 05/08/2019 0739   ALBUMIN 3.8 04/25/2017 0805   AST 13 (L) 05/08/2019 0739   AST 13 04/25/2017 0805   ALT 12 05/08/2019 0739   ALT 15 04/25/2017 0805   ALKPHOS 94 05/08/2019 0739   ALKPHOS 79  04/25/2017 0805   BILITOT 0.3 05/08/2019 0739   BILITOT 0.47 04/25/2017 0805   GFRNONAA >60 07/07/2019 2112   GFRNONAA >60 05/08/2019 0739   GFRAA >60 07/07/2019 2112   GFRAA >60 05/08/2019 0739         RADIOGRAPHY: Dg Ribs Unilateral W/chest Right  Result Date: 07/07/2019 CLINICAL DATA:  Pain after fall EXAM: RIGHT RIBS AND CHEST - 3+ VIEW COMPARISON:  None. FINDINGS: The cardiomediastinal silhouette is stable. Post radiation changes in the left perihilar region are stable. No pneumothorax. No nodules or masses. Comminuted fracture of the proximal humerus described on dedicated images. No obvious right rib fractures are identified. IMPRESSION: No rib fractures noted. Electronically Signed  By: Dorise Bullion III M.D   On: 07/07/2019 14:48   Dg Lumbar Spine Complete  Result Date: 07/07/2019 CLINICAL DATA:  Pain after fall EXAM: LUMBAR SPINE - COMPLETE 4+ VIEW COMPARISON:  None. FINDINGS: No malalignment. No fractures. Degenerative changes are seen at L4-5. Atherosclerotic changes are seen in the abdominal aorta. IMPRESSION: No acute abnormalities. Electronically Signed   By: Dorise Bullion III M.D   On: 07/07/2019 14:49   Mr Brain W Wo Contrast  Result Date: 07/07/2019 CLINICAL DATA:  Dizziness.  History of small cell lung cancer. EXAM: MRI HEAD WITHOUT AND WITH CONTRAST TECHNIQUE: Multiplanar, multiecho pulse sequences of the brain and surrounding structures were obtained without and with intravenous contrast. CONTRAST:  Gadavist 7.5 mL. COMPARISON:  MRI brain 11/26/2010. FINDINGS: Brain: Two intracranial mass lesions are present with abnormal enhancement and central necrosis. The smaller lesion is in the superior vermis, approximately 1.5 x 2 x 2 cm. The larger lesion is in the midline to slight inferior vermis extending into the RIGHT cerebellar hemisphere, approximately 2.5 x 3 x 3 cm. There is moderate surrounding edema. And effacement of the fourth ventricle. Elsewhere there is atrophy  with prominence of the cisterns and sulci. Ventriculomegaly is favored to represent central atrophy rather than obstructive hydrocephalus. Vascular: Normal flow voids. Skull and upper cervical spine: Normal marrow signal. Sinuses/Orbits: Chronic sphenoid sinusitis.  Negative orbits. Other: Unremarkable. 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 There is moderate surrounding edema and effacement the fourth ventricle. Untreated, the patient is at risk for obstructive hydrocephalus. Neurosurgical consultation may be warranted. These results were called by telephone at the time of interpretation on 07/07/2019 at 5:44 pm to Dr. Eulis Foster who verbally acknowledged these results. Electronically Signed   By: Staci Righter M.D.   On: 07/07/2019 17:45   Dg Shoulder Left  Result Date: 07/07/2019 CLINICAL DATA:  Pain after fall. EXAM: LEFT SHOULDER - 2+ VIEW COMPARISON:  None. FINDINGS: There is a comminuted fracture through the humeral head and neck without significant displacement. No dislocation identified. Clavicle and scapular grossly intact. Limited views of the left chest are normal. IMPRESSION: Comminuted fracture of the humeral head and neck without significant displacement or dislocation. Electronically Signed   By: Dorise Bullion III M.D   On: 07/07/2019 14:45   Dg Humerus Left  Result Date: 07/07/2019 CLINICAL DATA:  Pain after fall EXAM: LEFT HUMERUS - 2+ VIEW COMPARISON:  None. FINDINGS: Again noted is a nondisplaced comminuted fracture of the left humeral head and neck. The remainder of the humerus is intact. IMPRESSION: Nondisplaced comminuted fracture of the left humeral head and neck. The remainder of the humerus is intact. Electronically Signed   By: Dorise Bullion III M.D   On: 07/07/2019 14:46      IMPRESSION/PLAN: This is a very pleasant 68 year old with MRI consistent with  metastatic disease to the brain. PET for restaging of her small  cell lung cancer is pending. This would be a very late recurrence. She underwent PCI Whole Brain RT in 2012.  I had a lengthy discussion with the patient after reviewing their MRI results with them.  We spoke about whole brain radiotherapy versus stereotactic radiosurgery to the brain. We spoke about the differing risks benefits and side effects of both of these treatments. During part of our discussion, we spoke about the hair loss, fatigue and cognitive effects that can result from whole brain radiotherapy.  Additionally, we spoke about radionecrosis  that can result from stereotactic radiosurgery. I explained that whole brain radiotherapy is more comprehensive and therefore can decrease the chance of recurrences elsewhere in the brain, while stereotactic radiosurgery only treats the areas of gross disease while sparing the rest of the brain parenchyma. Whole brain RT should ideally be avoided due to previous whole brain RT in 2012. There is a risk of peritumoral edema and hydrocephalus with either radiation technique.  After lengthy discussion, the patient would like to proceed with stereotactic brain radiosurgery to their metastatic disease; we'll arrange a 3T MRI to determine if any additional lesions need targeting beyond the 2 cerebellar lesions what we know of. They will meet with neurosurgery in the near future to discuss this further; a neurosurgeon will participate in their case.  (Preliminary opinion from [facility] team is that resection is NOT recommended.) I talked about the pros and cons of biopsy with the patient; although we cannot be sure these are brain metastases, her prior history and imaging characteristics heavily lean towards this; likely, the risks of biopsy outweigh the potential benefits. This can be further discussed with NSU.  CT simulation will take place after 3T MRI and treatment soon after.  I plan to deliver 27Gy Gy in  3 fraction to the brain tumors.   I spent 30 minutes of face  to face floor time with the patient and more than 50% of that time was spent in counseling and/or coordination of care.    __________________________________________   Eppie Gibson, MD

## 2019-07-08 NOTE — Progress Notes (Signed)
PROGRESS NOTE    Hailey Hill  XAJ:[id number] DOB: 1951/10/20 DOA: 07/07/2019 PCP: [name], NP    Brief Narrative:  68 y.o. female with medical history significant of small cell carcinoma of the lung diagnosed in 2011.  She is status post multiple courses of chemotherapy.  She has been followed by Dr. Earlie Server with last office visit May 15, 2019.  At that time she was considered to be in remission.  CT the chest showed no progressive or recurrent lesions.  Patient reports in the last 7-10 days she has had 7 falls including a fall the day of admission that resulted in sudden pain in her left arm. She describes a loss of balance and loss of control of movement but no loss of consciousness.  She presented to the emergency department for evaluation.  X-ray of the left humerus revealed a nondisplaced fracture of humeral neck and head.  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  Patient now admitted for pain control, PT/OT eval and for the initiation of steroid treatment for brain metastases with surrounding edema.   ED Course: Patient was hemodynamically stable.  Evaluation did reveal fracture of the left humeral neck and head.  MRI did confirm presence of metastatic lesions to the brain.  The tech placed the patient's left arm in a splint.  Patient was referred for admission  Assessment & Plan:   Active Problems:   Small cell lung cancer (HCC)   Metastatic lung cancer (metastasis from lung to other site) [facility])   Fracture, humerus, anatomical neck, left, closed, initial encounter   Metastatic cancer to brain ([place])  1. Metastatic Lung cancer with new Brain mets - Appreciate Oncology assistance. Recommendation for referral to Rad Onc for initiation of radiation tx - Continued on 4mg  decadron 4 times daily as tolerated -CT head reviewed, with findings of new mets in the vermis and cerebellum -Seen by PT/OT,  recommendation for HH vs CIR. Have placed consult for CIR  2. Fractured Humerus left  - non-displaced. ED provider placed in sling. - Recommend close follow up with Orthopedic surgery on d/c -Continue with analgesics as tolerated  DVT prophylaxis: Lovenox subq Code Status: Full Family Communication: Pt in room, family not at bedside Disposition Plan: Uncertain at this time  Consultants:   Oncology  CIR  Procedures:     Antimicrobials: Anti-infectives (From admission, onward)   None       Subjective: Without complaints. Eager to go home  Objective: Vitals:   07/08/19 1606 07/08/19 1607 07/08/19 1610 07/08/19 1613  BP: 131/75 112/78 115/74   Pulse: 75 81 82   Resp:      Temp:      TempSrc:      SpO2: 100%  (!) 88% 97%  Weight:      Height:        Intake/Output Summary (Last 24 hours) at 07/08/2019 1637 Last data filed at 07/08/2019 0600 Gross per 24 hour  Intake -  Output 350 ml  Net -350 ml   Filed Weights   07/07/19 1235  Weight: 68 kg    Examination:  General exam: Appears calm and comfortable  Respiratory system: Clear to auscultation. Respiratory effort normal. Cardiovascular system: S1 & S2 heard, RRR Gastrointestinal system: Abdomen is nondistended, soft and nontender. No organomegaly or masses felt. Normal bowel sounds heard. Central nervous system: Alert and oriented. No focal neurological deficits. Extremities: Symmetric 5 x 5 power,  L arm in sling Skin: No rashes, lesions  Psychiatry: Judgement and insight appear normal. Mood & affect appropriate.   Data Reviewed: I have personally reviewed following labs and imaging studies  CBC: Recent Labs  Lab 07/07/19 1322 07/07/19 2112  WBC 11.3* 11.2*  NEUTROABS 9.3*  --   HGB 12.8 12.8  HCT 38.3 37.6  MCV 91.4 90.8  PLT 242 509   Basic Metabolic Panel: Recent Labs  Lab 07/07/19 1322 07/07/19 2112  NA 140  --   K 3.9  --   CL 104  --   CO2 25  --   GLUCOSE 123*  --   BUN 15   --   CREATININE 0.72 0.69  CALCIUM 9.3  --    GFR: Estimated Creatinine Clearance: 69.8 mL/min (by C-G formula based on SCr of 0.69 mg/dL). Liver Function Tests: No results for input(s): AST, ALT, ALKPHOS, BILITOT, PROT, ALBUMIN in the last 168 hours. No results for input(s): LIPASE, AMYLASE in the last 168 hours. No results for input(s): AMMONIA in the last 168 hours. Coagulation Profile: No results for input(s): INR, PROTIME in the last 168 hours. Cardiac Enzymes: No results for input(s): CKTOTAL, CKMB, CKMBINDEX, TROPONINI in the last 168 hours. BNP (last 3 results) No results for input(s): PROBNP in the last 8760 hours. HbA1C: No results for input(s): HGBA1C in the last 72 hours. CBG: No results for input(s): GLUCAP in the last 168 hours. Lipid Profile: No results for input(s): CHOL, HDL, LDLCALC, TRIG, CHOLHDL, LDLDIRECT in the last 72 hours. Thyroid Function Tests: No results for input(s): TSH, T4TOTAL, FREET4, T3FREE, THYROIDAB in the last 72 hours. Anemia Panel: No results for input(s): VITAMINB12, FOLATE, FERRITIN, TIBC, IRON, RETICCTPCT in the last 72 hours. Sepsis Labs: No results for input(s): PROCALCITON, LATICACIDVEN in the last 168 hours.  Recent Results (from the past 240 hour(s))  SARS Coronavirus 2 [facility] order, Performed in [facility] hospital lab) Nasopharyngeal Nasopharyngeal Swab     Status: None   Collection Time: 07/07/19  6:44 PM   Specimen: Nasopharyngeal Swab  Result Value Ref Range Status   SARS Coronavirus 2 NEGATIVE NEGATIVE Final    Comment: (NOTE) If result is NEGATIVE SARS-CoV-2 target nucleic acids are NOT DETECTED. The SARS-CoV-2 RNA is generally detectable in upper and lower  respiratory specimens during the acute phase of infection. The lowest  concentration of SARS-CoV-2 viral copies this assay can detect is 250  copies / mL. A negative result does not preclude SARS-CoV-2 infection  and should not be used as the sole basis for  treatment or other  patient management decisions.  A negative result may occur with  improper specimen collection / handling, submission of specimen other  than nasopharyngeal swab, presence of viral mutation(s) within the  areas targeted by this assay, and inadequate number of viral copies  (<250 copies / mL). A negative result must be combined with clinical  observations, patient history, and epidemiological information. If result is POSITIVE SARS-CoV-2 target nucleic acids are DETECTED. The SARS-CoV-2 RNA is generally detectable in upper and lower  respiratory specimens dur ing the acute phase of infection.  Positive  results are indicative of active infection with SARS-CoV-2.  Clinical  correlation with patient history and other diagnostic information is  necessary to determine patient infection status.  Positive results do  not rule out bacterial infection or co-infection with other viruses. If result is PRESUMPTIVE POSTIVE SARS-CoV-2 nucleic acids MAY BE PRESENT.   A presumptive positive result was  obtained on the submitted specimen  and confirmed on repeat testing.  While 2019 novel coronavirus  (SARS-CoV-2) nucleic acids may be present in the submitted sample  additional confirmatory testing may be necessary for epidemiological  and / or clinical management purposes  to differentiate between  SARS-CoV-2 and other Sarbecovirus currently known to infect humans.  If clinically indicated additional testing with an alternate test  methodology [phone]) is advised. The SARS-CoV-2 RNA is generally  detectable in upper and lower respiratory sp ecimens during the acute  phase of infection. The expected result is Negative. Fact Sheet for Patients:  StrictlyIdeas.no Fact Sheet for Healthcare Providers: BankingDealers.co.za This test is not yet approved or cleared by the [place] FDA and has been authorized for detection and/or  diagnosis of SARS-CoV-2 by FDA under an Emergency Use Authorization (EUA).  This EUA will remain in effect (meaning this test can be used) for the duration of the COVID-19 declaration under Section 564(b)(1) of the Act, 21 U.S.C. section 360bbb-3(b)(1), unless the authorization is terminated or revoked sooner. Performed at [facility], [place] [address] [place], [place] [address]      Radiology Studies: Dg Ribs Unilateral W/chest Right  Result Date: 07/07/2019 CLINICAL DATA:  Pain after fall EXAM: RIGHT RIBS AND CHEST - 3+ VIEW COMPARISON:  None. FINDINGS: The cardiomediastinal silhouette is stable. Post radiation changes in the left perihilar region are stable. No pneumothorax. No nodules or masses. Comminuted fracture of the proximal humerus described on dedicated images. No obvious right rib fractures are identified. IMPRESSION: No rib fractures noted. Electronically Signed   By: Dorise Bullion III M.D   On: 07/07/2019 14:48   Dg Lumbar Spine Complete  Result Date: 07/07/2019 CLINICAL DATA:  Pain after fall EXAM: LUMBAR SPINE - COMPLETE 4+ VIEW COMPARISON:  None. FINDINGS: No malalignment. No fractures. Degenerative changes are seen at L4-5. Atherosclerotic changes are seen in the abdominal aorta. IMPRESSION: No acute abnormalities. Electronically Signed   By: Dorise Bullion III M.D   On: 07/07/2019 14:49   Mr Brain W Wo Contrast  Result Date: 07/07/2019 CLINICAL DATA:  Dizziness.  History of small cell lung cancer. EXAM: MRI HEAD WITHOUT AND WITH CONTRAST TECHNIQUE: Multiplanar, multiecho pulse sequences of the brain and surrounding structures were obtained without and with intravenous contrast. CONTRAST:  Gadavist 7.5 mL. COMPARISON:  MRI brain 11/26/2010. FINDINGS: Brain: Two intracranial mass lesions are present with abnormal enhancement and central necrosis. The smaller lesion is in the superior vermis, approximately 1.5 x 2 x 2 cm. The larger lesion is in the  midline to slight inferior vermis extending into the RIGHT cerebellar hemisphere, approximately 2.5 x 3 x 3 cm. There is moderate surrounding edema. And effacement of the fourth ventricle. Elsewhere there is atrophy with prominence of the cisterns and sulci. Ventriculomegaly is favored to represent central atrophy rather than obstructive hydrocephalus. Vascular: Normal flow voids. Skull and upper cervical spine: Normal marrow signal. Sinuses/Orbits: Chronic sphenoid sinusitis.  Negative orbits. Other: Unremarkable. 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 There is moderate surrounding edema and effacement the fourth ventricle. Untreated, the patient is at risk for obstructive hydrocephalus. Neurosurgical consultation may be warranted. These results were called by telephone at the time of interpretation on 07/07/2019 at 5:44 pm to Dr. Eulis Foster who verbally acknowledged these results. Electronically Signed   By: Staci Righter M.D.   On: 07/07/2019 17:45   Dg Shoulder Left  Result Date: 07/07/2019 CLINICAL DATA:  Pain after fall. EXAM: LEFT SHOULDER - 2+ VIEW COMPARISON:  None. FINDINGS: There is a comminuted fracture through the humeral head and neck without significant displacement. No dislocation identified. Clavicle and scapular grossly intact. Limited views of the left chest are normal. IMPRESSION: Comminuted fracture of the humeral head and neck without significant displacement or dislocation. Electronically Signed   By: Dorise Bullion III M.D   On: 07/07/2019 14:45   Dg Humerus Left  Result Date: 07/07/2019 CLINICAL DATA:  Pain after fall EXAM: LEFT HUMERUS - 2+ VIEW COMPARISON:  None. FINDINGS: Again noted is a nondisplaced comminuted fracture of the left humeral head and neck. The remainder of the humerus is intact. IMPRESSION: Nondisplaced comminuted fracture of the left humeral head and neck. The remainder of the humerus is intact.  Electronically Signed   By: Dorise Bullion III M.D   On: 07/07/2019 14:46    Scheduled Meds: . dexamethasone  4 mg Oral QID  . enoxaparin (LOVENOX) injection  40 mg Subcutaneous QHS  . senna  1 tablet Oral BID   Continuous Infusions:   LOS: 1 day   Marylu Lund, MD [facility] Pager On Amion  If 7PM-7AM, please contact night-coverage 07/08/2019, 4:37 PM

## 2019-07-08 NOTE — Evaluation (Signed)
Physical Therapy Evaluation Patient Details Name: Hailey Hill MRN: [id number] DOB: August 18, 1951 Today's Date: 07/08/2019   History of Present Illness  Pt is a 68 year old female admitted after mutliple falls at home and with Left arm pain.  X-ray of the left humerus revealed a nondisplaced fracture of humeral neck and head.  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  Pt with medical history significant of small cell carcinoma of the lung diagnosed in 2011.  She is status post multiple courses of chemotherapy  Clinical Impression  Pt admitted with above diagnosis.  Pt currently with functional limitations due to the deficits listed below (see PT Problem List). Pt will benefit from skilled PT to increase their independence and safety with mobility to allow discharge to the venue listed below.  Pt reports multiple falls at home and was furniture walking prior to admission.  Pt reports she is awaiting MD to discuss cause of her imbalance.  Spouse not likely to provide physical assist required at this time and also works during the day.  Pt requiring assist for steadying throughout mobility.   Follow Up Recommendations Supervision/Assistance - 24 hour;CIR    Equipment Recommendations  Other (comment)(need to further assess use of cane)    Recommendations for Other Services       Precautions / Restrictions Precautions Precautions: Fall Required Braces or Orthoses: Sling Restrictions Weight Bearing Restrictions: Yes LUE Weight Bearing: Non weight bearing Other Position/Activity Restrictions: verbally discussed with Dr. Wyline Copas and he reports f/u with outpatient ortho and keep NWB with sling      Mobility  Bed Mobility Overal bed mobility: Needs Assistance Bed Mobility: Supine to Sit     Supine to sit: HOB elevated;Supervision     General bed mobility comments: pt utilized hand rail to self assist (exited on right side  of bed)  Transfers Overall transfer level: Needs assistance Equipment used: None Transfers: Sit to/from Stand Sit to Stand: Min assist;From elevated surface         General transfer comment: assist to rise and steady, required use of R UE  Ambulation/Gait Ambulation/Gait assistance: Mod assist Gait Distance (Feet): 40 Feet Assistive device: 1 person hand held assist Gait Pattern/deviations: Step-through pattern Gait velocity: decr   General Gait Details: increased lateral left trunk lean however able to correct with cues, ataxic gait and requiring steadying assist throughout gait  Stairs            Wheelchair Mobility    Modified Rankin (Stroke Patients Only)       Balance Overall balance assessment: History of Falls;Needs assistance         Standing balance support: Single extremity supported Standing balance-Leahy Scale: Poor Standing balance comment: requires UE support and external assist                             Pertinent Vitals/Pain Pain Assessment: 0-10 Pain Score: 5  Pain Location: L arm with movement (none at rest) Pain Descriptors / Indicators: Sore;Aching;Tender Pain Intervention(s): Repositioned;Monitored during session    Home Living Family/patient expects to be discharged to:: Private residence Living Arrangements: Spouse/significant other   Type of Home: House Home Access: Stairs to enter Entrance Stairs-Rails: None Entrance Stairs-Number of Steps: 2-6 (depending on entrance) Home Layout: One level Home Equipment: None      Prior Function Level of Independence: Independent  Comments: admits to furniture walking with increased balance difficulty     Hand Dominance        Extremity/Trunk Assessment   Upper Extremity Assessment Upper Extremity Assessment: LUE deficits/detail LUE Deficits / Details: remained in sling    Lower Extremity Assessment Lower Extremity Assessment: Overall WFL for tasks  assessed       Communication   Communication: No difficulties  Cognition Arousal/Alertness: Awake/alert Behavior During Therapy: WFL for tasks assessed/performed Overall Cognitive Status: Within Functional Limits for tasks assessed                                        General Comments      Exercises     Assessment/Plan    PT Assessment Patient needs continued PT services  PT Problem List Decreased mobility;Decreased strength;Decreased activity tolerance;Decreased balance;Decreased knowledge of use of DME;Decreased coordination;Pain       PT Treatment Interventions DME instruction;Gait training;Balance training;Therapeutic exercise;Functional mobility training;Therapeutic activities;Patient/family education;Stair training    PT Goals (Current goals can be found in the Care Plan section)  Acute Rehab PT Goals PT Goal Formulation: With patient Time For Goal Achievement: 07/22/19 Potential to Achieve Goals: Good    Frequency Min 3X/week   Barriers to discharge        Co-evaluation               AM-PAC PT "6 Clicks" Mobility  Outcome Measure Help needed turning from your back to your side while in a flat bed without using bedrails?: A Little Help needed moving from lying on your back to sitting on the side of a flat bed without using bedrails?: A Little Help needed moving to and from a bed to a chair (including a wheelchair)?: A Little Help needed standing up from a chair using your arms (e.g., wheelchair or bedside chair)?: A Lot Help needed to walk in hospital room?: A Lot Help needed climbing 3-5 steps with a railing? : A Lot 6 Click Score: 15    End of Session Equipment Utilized During Treatment: Gait belt Activity Tolerance: Patient tolerated treatment well Patient left: in chair;with chair alarm set;with call bell/phone within reach   PT Visit Diagnosis: Difficulty in walking, not elsewhere classified (R26.2);Unsteadiness on feet  (R26.81);Repeated falls (R29.6)    Time: 1093-2355 PT Time Calculation (min) (ACUTE ONLY): 18 min   Charges:   PT Evaluation $PT Eval Low Complexity: [place], PT, DPT Acute Rehabilitation Services Office: [phone] Pager: [phone]  Trena Platt 07/08/2019, 12:37 PM

## 2019-07-09 ENCOUNTER — Ambulatory Visit (HOSPITAL_COMMUNITY)
Admit: 2019-07-09 | Discharge: 2019-07-09 | Disposition: A | Discharge status: Home / Self Care | Payer: Medicare HMO | Attending: Radiation Oncology | Admitting: Radiation Oncology

## 2019-07-09 ENCOUNTER — Telehealth: Payer: Self-pay | Admitting: Radiation Therapy

## 2019-07-09 ENCOUNTER — Other Ambulatory Visit (HOSPITAL_COMMUNITY): Payer: Medicare HMO

## 2019-07-09 LAB — HIV ANTIBODY (ROUTINE TESTING W REFLEX): HIV Screen 4th Generation wRfx: NONREACTIVE

## 2019-07-09 MED ORDER — ALPRAZOLAM 0.5 MG PO TABS
0.5000 mg | ORAL_TABLET | ORAL | Status: DC | PRN
  Administered 2019-07-09 – 2019-07-10 (×2): 0.5 mg via ORAL
  Filled 2019-07-09 (×2): qty 1

## 2019-07-09 MED ORDER — GADOBUTROL 1 MMOL/ML IV SOLN
7.0000 mL | Freq: Once | INTRAVENOUS | Status: AC | PRN
  Administered 2019-07-09: 7 mL via INTRAVENOUS

## 2019-07-09 NOTE — Progress Notes (Signed)
Patient was transferred to [facility] for MRI brain at 1330. RN gave a report to [place].

## 2019-07-09 NOTE — Telephone Encounter (Signed)
I spoke with Hailey Hill about her upcoming planning visits for brain SRS. She will be transported to [facility] later today for a 3T scan using the SRS protocol. We will bring her to our department Wed afternoon for simulation and her first planned treatment of 3 will be delivered on Tuesday 9/8. Dr. Saintclair Halsted is the neurosurgeon working with Dr. Isidore Moos to deliver the focused radiation treatment described. He will be stopping by in her hospital room to introduce himself and offer a time for her to ask him questions.     Mont Dutton R.T.(R)(T) Radiation Special Procedures Navigator  [phone]

## 2019-07-09 NOTE — Progress Notes (Signed)
PROGRESS NOTE    Hailey Hill  HUD:[id number] DOB: Mar 12, 1951 DOA: 07/07/2019 PCP: [name], NP    Brief Narrative:  68 y.o. female with medical history significant of small cell carcinoma of the lung diagnosed in 2011.  She is status post multiple courses of chemotherapy.  She has been followed by Dr. Earlie Server with last office visit May 15, 2019.  At that time she was considered to be in remission.  CT the chest showed no progressive or recurrent lesions.  Patient reports in the last 7-10 days she has had 7 falls including a fall the day of admission that resulted in sudden pain in her left arm. She describes a loss of balance and loss of control of movement but no loss of consciousness.  She presented to the emergency department for evaluation.  X-ray of the left humerus revealed a nondisplaced fracture of humeral neck and head.  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  Patient now admitted for pain control, PT/OT eval and for the initiation of steroid treatment for brain metastases with surrounding edema.   ED Course: Patient was hemodynamically stable.  Evaluation did reveal fracture of the left humeral neck and head.  MRI did confirm presence of metastatic lesions to the brain.  The tech placed the patient's left arm in a splint.  Patient was referred for admission  Assessment & Plan:   Active Problems:   Small cell lung cancer (HCC)   Metastatic lung cancer (metastasis from lung to other site) [facility])   Fracture, humerus, anatomical neck, left, closed, initial encounter   Metastatic cancer to brain ([place])  1. Metastatic Lung cancer with new Brain mets - Continued on 4mg  decadron 4 times daily as tolerated -CT head reviewed, with findings of new mets in the vermis and cerebellum -Seen by PT/OT, recommendation for HH vs CIR. Have placed consult for CIR -Oncology is following, recommendation for Radiation  Oncology referral. Pt to begin radiation tx per Rad Onc, for MRI brain today  2. Fractured Humerus left  - non-displaced. ED provider placed in sling. - Have discussed case with Dr. Lorin Mercy. Recommendation for outpatient follow up with him in 1 week after discharge from hospital - Continue analgesia and sling  DVT prophylaxis: Lovenox subq Code Status: Full Family Communication: Pt in room, family not at bedside Disposition Plan: Uncertain at this time  Consultants:   Oncology  CIR  Discussed with Orthopedic Surgery, Dr. Lorin Mercy, over phone  Procedures:     Antimicrobials: Anti-infectives (From admission, onward)   None      Subjective: In good spiritrs this AM. No complaints  Objective: Vitals:   07/08/19 1613 07/08/19 2105 07/09/19 0522 07/09/19 1328  BP:  110/63 114/71 130/79  Pulse:  70 71 82  Resp:  16 18 18   Temp:  97.8 F (36.6 C) 98.3 F (36.8 C) 99.1 F (37.3 C)  TempSrc:  Oral Oral Oral  SpO2: 97% 100% 97% 98%  Weight:      Height:        Intake/Output Summary (Last 24 hours) at 07/09/2019 1801 Last data filed at 07/09/2019 1700 Gross per 24 hour  Intake 1200 ml  Output 1202 ml  Net -2 ml   Filed Weights   07/07/19 1235  Weight: 68 kg    Examination: General exam: Awake, laying in bed, in nad Respiratory system: Normal respiratory effort, no wheezing Cardiovascular system: regular rate, s1, s2 Gastrointestinal  system: Soft, nondistended, positive BS Central nervous system: CN2-12 grossly intact, strength intact Extremities: Perfused, no clubbing, L arm in sling Skin: Normal skin turgor, no notable skin lesions seen Psychiatry: Mood normal // no visual hallucinations   Data Reviewed: I have personally reviewed following labs and imaging studies  CBC: Recent Labs  Lab 07/07/19 1322 07/07/19 2112  WBC 11.3* 11.2*  NEUTROABS 9.3*  --   HGB 12.8 12.8  HCT 38.3 37.6  MCV 91.4 90.8  PLT 242 643   Basic Metabolic Panel: Recent Labs   Lab 07/07/19 1322 07/07/19 2112  NA 140  --   K 3.9  --   CL 104  --   CO2 25  --   GLUCOSE 123*  --   BUN 15  --   CREATININE 0.72 0.69  CALCIUM 9.3  --    GFR: Estimated Creatinine Clearance: 69.8 mL/min (by C-G formula based on SCr of 0.69 mg/dL). Liver Function Tests: No results for input(s): AST, ALT, ALKPHOS, BILITOT, PROT, ALBUMIN in the last 168 hours. No results for input(s): LIPASE, AMYLASE in the last 168 hours. No results for input(s): AMMONIA in the last 168 hours. Coagulation Profile: No results for input(s): INR, PROTIME in the last 168 hours. Cardiac Enzymes: No results for input(s): CKTOTAL, CKMB, CKMBINDEX, TROPONINI in the last 168 hours. BNP (last 3 results) No results for input(s): PROBNP in the last 8760 hours. HbA1C: No results for input(s): HGBA1C in the last 72 hours. CBG: No results for input(s): GLUCAP in the last 168 hours. Lipid Profile: No results for input(s): CHOL, HDL, LDLCALC, TRIG, CHOLHDL, LDLDIRECT in the last 72 hours. Thyroid Function Tests: No results for input(s): TSH, T4TOTAL, FREET4, T3FREE, THYROIDAB in the last 72 hours. Anemia Panel: No results for input(s): VITAMINB12, FOLATE, FERRITIN, TIBC, IRON, RETICCTPCT in the last 72 hours. Sepsis Labs: No results for input(s): PROCALCITON, LATICACIDVEN in the last 168 hours.  Recent Results (from the past 240 hour(s))  SARS Coronavirus 2 [facility] order, Performed in [facility] hospital lab) Nasopharyngeal Nasopharyngeal Swab     Status: None   Collection Time: 07/07/19  6:44 PM   Specimen: Nasopharyngeal Swab  Result Value Ref Range Status   SARS Coronavirus 2 NEGATIVE NEGATIVE Final    Comment: (NOTE) If result is NEGATIVE SARS-CoV-2 target nucleic acids are NOT DETECTED. The SARS-CoV-2 RNA is generally detectable in upper and lower  respiratory specimens during the acute phase of infection. The lowest  concentration of SARS-CoV-2 viral copies this assay can detect is 250   copies / mL. A negative result does not preclude SARS-CoV-2 infection  and should not be used as the sole basis for treatment or other  patient management decisions.  A negative result may occur with  improper specimen collection / handling, submission of specimen other  than nasopharyngeal swab, presence of viral mutation(s) within the  areas targeted by this assay, and inadequate number of viral copies  (<250 copies / mL). A negative result must be combined with clinical  observations, patient history, and epidemiological information. If result is POSITIVE SARS-CoV-2 target nucleic acids are DETECTED. The SARS-CoV-2 RNA is generally detectable in upper and lower  respiratory specimens dur ing the acute phase of infection.  Positive  results are indicative of active infection with SARS-CoV-2.  Clinical  correlation with patient history and other diagnostic information is  necessary to determine patient infection status.  Positive results do  not rule out bacterial infection or co-infection with other viruses.  If result is PRESUMPTIVE POSTIVE SARS-CoV-2 nucleic acids MAY BE PRESENT.   A presumptive positive result was obtained on the submitted specimen  and confirmed on repeat testing.  While 2019 novel coronavirus  (SARS-CoV-2) nucleic acids may be present in the submitted sample  additional confirmatory testing may be necessary for epidemiological  and / or clinical management purposes  to differentiate between  SARS-CoV-2 and other Sarbecovirus currently known to infect humans.  If clinically indicated additional testing with an alternate test  methodology [phone]) is advised. The SARS-CoV-2 RNA is generally  detectable in upper and lower respiratory sp ecimens during the acute  phase of infection. The expected result is Negative. Fact Sheet for Patients:  StrictlyIdeas.no Fact Sheet for Healthcare Providers: BankingDealers.co.za  This test is not yet approved or cleared by the [place] FDA and has been authorized for detection and/or diagnosis of SARS-CoV-2 by FDA under an Emergency Use Authorization (EUA).  This EUA will remain in effect (meaning this test can be used) for the duration of the COVID-19 declaration under Section 564(b)(1) of the Act, 21 U.S.C. section 360bbb-3(b)(1), unless the authorization is terminated or revoked sooner. Performed at [facility], [place] [address] [place], [place] [address]      Radiology Studies: No results found.  Scheduled Meds: . dexamethasone  4 mg Oral QID  . enoxaparin (LOVENOX) injection  40 mg Subcutaneous QHS  . senna  1 tablet Oral BID   Continuous Infusions:   LOS: 2 days   Marylu Lund, MD [facility] Pager On Amion  If 7PM-7AM, please contact night-coverage 07/09/2019, 6:01 PM

## 2019-07-09 NOTE — Progress Notes (Addendum)
Inpatient Rehabilitation Admissions Coordinator  Inpatient rehab consult received. I reviewed chart and spoke with patient by phone for rehab assessment. Patient states her husband works 5 am until 3 pm daily. She has an Aunt who can likely come stay with her to assist her when her spouse works. Patient states radiation is to be set up and she knows it will wipe her out like it did 10 years ago when she last received radiation. She is asking to see an orthopedic surgeon before d/c. I will follow up with her medical workup to assist with planning her dispo as workup continues.  Danne Baxter, RN, MSN Rehab Admissions Coordinator [phone] 07/09/2019 8:35 AM  I have received call from Manuela Schwartz at [facility] [phone]) center of planned radiation 9/8, 9/11 and 9/14. I will follow up with patient later today to further discuss her rehab options.

## 2019-07-09 NOTE — Progress Notes (Signed)
RN got report from MRI dept. Patient scheduled for MRI brain w wo contrast 07/09/2019 at 1400. [facility] requested for patient transport 1330.

## 2019-07-09 NOTE — Progress Notes (Signed)
OT Cancellation Note  Patient Details Name: MAREE AINLEY MRN: [id number] DOB: Oct 19, 1951   Cancelled Treatment:    Reason Eval/Treat Not Completed: Patient at procedure or test/ unavailable  (pt at [facility] for [facility])  [place], Mickel Baas, [place] [phone] [phone]    07/09/2019, 2:57 PM

## 2019-07-10 ENCOUNTER — Ambulatory Visit
Admission: RE | Admit: 2019-07-10 | Discharge: 2019-07-10 | Disposition: A | Discharge status: Home / Self Care | Payer: Medicare HMO | Source: Ambulatory Visit | Attending: Radiation Oncology | Admitting: Radiation Oncology

## 2019-07-10 ENCOUNTER — Encounter: Payer: Self-pay | Admitting: Radiation Oncology

## 2019-07-10 ENCOUNTER — Telehealth: Payer: Self-pay | Admitting: Internal Medicine

## 2019-07-10 DIAGNOSIS — Z510 Encounter for antineoplastic radiation therapy: Secondary | ICD-10-CM | POA: Insufficient documentation

## 2019-07-10 DIAGNOSIS — C7931 Secondary malignant neoplasm of brain: Secondary | ICD-10-CM | POA: Insufficient documentation

## 2019-07-10 DIAGNOSIS — C3490 Malignant neoplasm of unspecified part of unspecified bronchus or lung: Secondary | ICD-10-CM | POA: Insufficient documentation

## 2019-07-10 DIAGNOSIS — Z87891 Personal history of nicotine dependence: Secondary | ICD-10-CM | POA: Insufficient documentation

## 2019-07-10 DIAGNOSIS — S42292A Other displaced fracture of upper end of left humerus, initial encounter for closed fracture: Secondary | ICD-10-CM

## 2019-07-10 DIAGNOSIS — W19XXXD Unspecified fall, subsequent encounter: Secondary | ICD-10-CM

## 2019-07-10 DIAGNOSIS — W19XXXA Unspecified fall, initial encounter: Secondary | ICD-10-CM | POA: Diagnosis present

## 2019-07-10 NOTE — Progress Notes (Signed)
PROGRESS NOTE    Hailey Hill  JSE:[id number] DOB: May 22, 1951 DOA: 07/07/2019 PCP: [name], NP    Brief Narrative:  68 y.o.femalewith medical history significant ofsmall cell carcinoma of the lung diagnosed in 2011. She is status post multiple courses of chemotherapy. She has been followed by Dr. Earlie Server with last office visit May 15, 2019. At that time she was considered to be in remission. CT the chest showed no progressive or recurrent lesions. Patient reports in the last 7-10days she has had 56falls including a fall the day of admission that resulted in sudden pain in her left arm. She describes a loss of balance and loss of control of movement but no loss of consciousness.She presented to the emergency department for evaluation. X-ray of the left humerus revealed a nondisplaced fracture of humeral neck and head. MRI of the brain revealed the patient to have multiple metastatic lesions including the vermis and cerebellum.Patient now admitted for pain control, PT/OT evaland for the initiation of steroid treatment for brain metastases with surrounding edema.  ED Course:Patient was hemodynamically stable. Evaluation did reveal fracture of the left humeral neck and head. MRI did confirm presence of metastatic lesions to the brain.The tech placed the patient's left arm in a splint. Patient was referred for admission    Assessment & Plan:   Principal Problem:   Metastatic cancer to brain [facility]) Active Problems:   Small cell lung cancer ([place])   Metastatic lung cancer (metastasis from lung to other site) [facility])   Fracture, humerus, anatomical neck, left, closed, initial encounter   Fall  1 metastatic lung cancer with new brain mets Patient presented with falls with a left humeral fracture.  MRI of the brain which was done revealed multiple metastatic lesions including the vermis and cerebellum.  Oncology was consulted who had recommended Decadron 4 mg 4 times  daily as tolerated.  Oncology also recommended evaluation by rad oncology for [facility]. Patient for simulation today.  Per oncology.  PT/OT.  2.  Left humerus fracture  Nondisplaced per ED provider and patient was placed in a sling.  Dr. Wyline Copas discussed findings with Dr. Lorin Mercy who recommended outpatient follow-up with him 1 week post discharge from the hospital.  Continue current pain regimen.  3   fall Secondary to problem #1.  PT/OT.  Patient seen by physical therapy and is noted that patient continues to require assistance for mobility for safety due to unsteadiness.  PT recommending 24/7 assistance upon DC due to high fall risk versus skilled nursing facility.  Patient has been assessed by inpatient rehab who are recommending SNF placement.  Social work consulted.    DVT prophylaxis: Lovenox Code Status: Full Family Communication: Updated patient.  No family at bedside. Disposition Plan: Likely skilled nursing facility when bed available hopefully tomorrow versus home with home health, with 24-hour supervision   Consultants:   Radiation oncology: Eppie Gibson 07/08/2019  CIR  Oncology [place] sided orthopedic surgery, Dr. Lorin Mercy over the phone per Dr. Wyline Copas,  Procedures:   MRI brain 07/09/2019   Antimicrobials:   None   Subjective: Patient sitting up in chair trying to eat her lunch.  Denies any chest pain or shortness of breath.  Feeling somewhat better.  Objective: Vitals:   07/09/19 1328 07/09/19 2159 07/10/19 0540 07/10/19 1422  BP: 130/79 118/76 130/73 111/71  Pulse: 82 65 66 71  Resp: 18 20 20 18   Temp: 99.1 F (37.3 C) 99 F (37.2 C) 97.9 F (36.6 C) 98 F (  36.7 C)  TempSrc: Oral  Oral Oral  SpO2: 98% 97% 99% 100%  Weight:      Height:        Intake/Output Summary (Last 24 hours) at 07/10/2019 1755 Last data filed at 07/10/2019 0900 Gross per 24 hour  Intake 360 ml  Output 1000 ml  Net -640 ml   Filed Weights   07/07/19 1235  Weight: 68 kg    Examination:   General exam: Appears calm and comfortable  Respiratory system: Clear to auscultation. Respiratory effort normal. Cardiovascular system: S1 & S2 heard, RRR. No JVD, murmurs, rubs, gallops or clicks. No pedal edema. Gastrointestinal system: Abdomen is nondistended, soft and nontender. No organomegaly or masses felt. Normal bowel sounds heard. Central nervous system: Alert and oriented. No focal neurological deficits. Extremities: Left upper extremity in sling.  Symmetric 5 x 5 power. Skin: No rashes, lesions or ulcers Psychiatry: Judgement and insight appear normal. Mood & affect appropriate.     Data Reviewed: I have personally reviewed following labs and imaging studies  CBC: Recent Labs  Lab 07/07/19 1322 07/07/19 2112  WBC 11.3* 11.2*  NEUTROABS 9.3*  --   HGB 12.8 12.8  HCT 38.3 37.6  MCV 91.4 90.8  PLT 242 425   Basic Metabolic Panel: Recent Labs  Lab 07/07/19 1322 07/07/19 2112  NA 140  --   K 3.9  --   CL 104  --   CO2 25  --   GLUCOSE 123*  --   BUN 15  --   CREATININE 0.72 0.69  CALCIUM 9.3  --    GFR: Estimated Creatinine Clearance: 69.8 mL/min (by C-G formula based on SCr of 0.69 mg/dL). Liver Function Tests: No results for input(s): AST, ALT, ALKPHOS, BILITOT, PROT, ALBUMIN in the last 168 hours. No results for input(s): LIPASE, AMYLASE in the last 168 hours. No results for input(s): AMMONIA in the last 168 hours. Coagulation Profile: No results for input(s): INR, PROTIME in the last 168 hours. Cardiac Enzymes: No results for input(s): CKTOTAL, CKMB, CKMBINDEX, TROPONINI in the last 168 hours. BNP (last 3 results) No results for input(s): PROBNP in the last 8760 hours. HbA1C: No results for input(s): HGBA1C in the last 72 hours. CBG: No results for input(s): GLUCAP in the last 168 hours. Lipid Profile: No results for input(s): CHOL, HDL, LDLCALC, TRIG, CHOLHDL, LDLDIRECT in the last 72 hours. Thyroid Function Tests: No results for input(s): TSH,  T4TOTAL, FREET4, T3FREE, THYROIDAB in the last 72 hours. Anemia Panel: No results for input(s): VITAMINB12, FOLATE, FERRITIN, TIBC, IRON, RETICCTPCT in the last 72 hours. Sepsis Labs: No results for input(s): PROCALCITON, LATICACIDVEN in the last 168 hours.  Recent Results (from the past 240 hour(s))  SARS Coronavirus 2 [facility] order, Performed in [facility] hospital lab) Nasopharyngeal Nasopharyngeal Swab     Status: None   Collection Time: 07/07/19  6:44 PM   Specimen: Nasopharyngeal Swab  Result Value Ref Range Status   SARS Coronavirus 2 NEGATIVE NEGATIVE Final    Comment: (NOTE) If result is NEGATIVE SARS-CoV-2 target nucleic acids are NOT DETECTED. The SARS-CoV-2 RNA is generally detectable in upper and lower  respiratory specimens during the acute phase of infection. The lowest  concentration of SARS-CoV-2 viral copies this assay can detect is 250  copies / mL. A negative result does not preclude SARS-CoV-2 infection  and should not be used as the sole basis for treatment or other  patient management decisions.  A negative result may occur  with  improper specimen collection / handling, submission of specimen other  than nasopharyngeal swab, presence of viral mutation(s) within the  areas targeted by this assay, and inadequate number of viral copies  (<250 copies / mL). A negative result must be combined with clinical  observations, patient history, and epidemiological information. If result is POSITIVE SARS-CoV-2 target nucleic acids are DETECTED. The SARS-CoV-2 RNA is generally detectable in upper and lower  respiratory specimens dur ing the acute phase of infection.  Positive  results are indicative of active infection with SARS-CoV-2.  Clinical  correlation with patient history and other diagnostic information is  necessary to determine patient infection status.  Positive results do  not rule out bacterial infection or co-infection with other viruses. If result is  PRESUMPTIVE POSTIVE SARS-CoV-2 nucleic acids MAY BE PRESENT.   A presumptive positive result was obtained on the submitted specimen  and confirmed on repeat testing.  While 2019 novel coronavirus  (SARS-CoV-2) nucleic acids may be present in the submitted sample  additional confirmatory testing may be necessary for epidemiological  and / or clinical management purposes  to differentiate between  SARS-CoV-2 and other Sarbecovirus currently known to infect humans.  If clinically indicated additional testing with an alternate test  methodology [phone]) is advised. The SARS-CoV-2 RNA is generally  detectable in upper and lower respiratory sp ecimens during the acute  phase of infection. The expected result is Negative. Fact Sheet for Patients:  StrictlyIdeas.no Fact Sheet for Healthcare Providers: BankingDealers.co.za This test is not yet approved or cleared by the [place] FDA and has been authorized for detection and/or diagnosis of SARS-CoV-2 by FDA under an Emergency Use Authorization (EUA).  This EUA will remain in effect (meaning this test can be used) for the duration of the COVID-19 declaration under Section 564(b)(1) of the Act, 21 U.S.C. section 360bbb-3(b)(1), unless the authorization is terminated or revoked sooner. Performed at [facility], [place] [address] [place],  [address]          Radiology Studies: Mr Jeri Cos YT Contrast  Result Date: 07/10/2019 CLINICAL DATA:  Small cell lung cancer.  Known brain metastases. EXAM: MRI HEAD WITHOUT AND WITH CONTRAST TECHNIQUE: Multiplanar, multiecho pulse sequences of the brain and surrounding structures were obtained without and with intravenous contrast. CONTRAST:  7 mL Gadavist COMPARISON:  1.5 tesla MRI head without and with contrast 07/07/2019. MR head without and with contrast 11/26/2010 and 07/13/2010 FINDINGS: Brain: A peripherally enhancing mass  lesion fall vein the inferior cerebellar vermis and medial right cerebellar hemisphere measures 3.3 x 3.4 x 2.5 cm. A second lesion is present in the superior cerebellar vermis with a similar heterogeneous enhancement pattern, but less prominent central necrosis. The second lesion measures 2.2 x 2.2 x 1.4 cm No supratentorial foci of enhancement are present. There is mild prominence of the lateral ventricles and third ventricles. This is likely secondary to mass effect from the lesion in the superior vermis on the aqueduct of Sylvius. Mild periventricular white matter changes are present. Extensive T2 signal changes surround the cerebellar lesions. Fourth ventricle is of normal size. Blood products or calcification are noted within both enhancing lesions. No other significant hemorrhage is present. The internal auditory canals are within normal limits. Mild white matter changes are noted in the brainstem. Vascular: Flow is present in the major intracranial arteries. Skull and upper cervical spine: The craniocervical junction is normal. Upper cervical spine is within normal limits. Marrow signal is unremarkable. Sinuses/Orbits: Small  polyps or mucous retention cysts are noted along the inferior maxillary sinuses bilaterally. There is minimal fluid in the left mastoid air cells. No obstructing nasopharyngeal lesion is present. The paranasal sinuses and mastoid air cells are otherwise clear. The globes and orbits are within normal limits. IMPRESSION: 1. Heterogeneous enhancing lesions in the cerebellum as described consistent with metastatic disease. 2. 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 3. Moderate surrounding vasogenic edema in the cerebellum. Cerebellar lesions and edema likely correlate with the patient's recent falls. 4. No supratentorial metastases. Electronically Signed   By: San Morelle M.D.   On: 07/10/2019 06:00         Scheduled Meds: . dexamethasone  4 mg Oral QID  . enoxaparin (LOVENOX) injection  40 mg Subcutaneous QHS  . senna  1 tablet Oral BID   Continuous Infusions:   LOS: 3 days    Time spent: 35 mins    Irine Seal, MD [facility]  If 7PM-7AM, please contact night-coverage [email or web address] 07/10/2019, 5:55 PM

## 2019-07-10 NOTE — Care Management Important Message (Signed)
Important Message  Patient Details IM Letter given to Sharren Bridge SW to present to the Patient Name: Hailey Hill MRN: [id number] Date of Birth: 1951-05-14   Medicare Important Message Given:  Yes     Kerin Salen 07/10/2019, 9:51 AM

## 2019-07-10 NOTE — Progress Notes (Signed)
Physical Therapy Treatment Patient Details Name: Hailey Hill MRN: [id number] DOB: 08/22/1951 Today's Date: 07/10/2019    History of Present Illness Pt is a 68 year old female admitted after mutliple falls at home and with Left arm pain.  X-ray of the left humerus revealed a nondisplaced fracture of humeral neck and head.  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  Pt with medical history significant of small cell carcinoma of the lung diagnosed in 2011.  She is status post multiple courses of chemotherapy    PT Comments    Pt assisted with ambulating in hallway.  Pt continues to require assist for mobility for safety due to unsteadiness.  Recommend 24/7 assist upon d/c due to high fall risk.  Pt reports her insurance denies inpatient rehab upon d/c and uncertain of d/c plans at this time.     Follow Up Recommendations  Supervision/Assistance - 24 hour;SNF     Equipment Recommendations  Other (comment)(likely cane, will further assess if home)    Recommendations for Other Services       Precautions / Restrictions Precautions Precautions: Fall Required Braces or Orthoses: Sling Restrictions Weight Bearing Restrictions: Yes LUE Weight Bearing: Non weight bearing    Mobility  Bed Mobility               General bed mobility comments: pt up in recliner  Transfers Overall transfer level: Needs assistance Equipment used: 1 person hand held assist Transfers: Sit to/from Stand Sit to Stand: Min assist         General transfer comment: assist to rise and steady, required use of R UE; performed x3 for safe technique and improving balance (initial posterior lean against chair to rise)  Ambulation/Gait Ambulation/Gait assistance: Min assist Gait Distance (Feet): 120 Feet Assistive device: 1 person hand held assist Gait Pattern/deviations: Step-through pattern;Narrow base of support;Ataxic Gait velocity:  decr   General Gait Details: decreased coordination of LEs however less assist required today, min assist for steadying   Stairs             Wheelchair Mobility    Modified Rankin (Stroke Patients Only)       Balance Overall balance assessment: History of Falls;Needs assistance         Standing balance support: Single extremity supported Standing balance-Leahy Scale: Poor Standing balance comment: requires UE support and external assist                            Cognition Arousal/Alertness: Awake/alert Behavior During Therapy: WFL for tasks assessed/performed Overall Cognitive Status: Within Functional Limits for tasks assessed                                        Exercises      General Comments        Pertinent Vitals/Pain Pain Assessment: 0-10 Pain Score: 3  Pain Location: shoulder Pain Descriptors / Indicators: Sore Pain Intervention(s): Monitored during session    Home Living                      Prior Function            PT Goals (current goals can now be found in the care plan section) Progress towards PT goals: Progressing toward goals  Frequency    Min 3X/week      PT Plan Discharge plan needs to be updated    Co-evaluation              AM-PAC PT "6 Clicks" Mobility   Outcome Measure  Help needed turning from your back to your side while in a flat bed without using bedrails?: A Little Help needed moving from lying on your back to sitting on the side of a flat bed without using bedrails?: A Little Help needed moving to and from a bed to a chair (including a wheelchair)?: A Little Help needed standing up from a chair using your arms (e.g., wheelchair or bedside chair)?: A Little Help needed to walk in hospital room?: A Little Help needed climbing 3-5 steps with a railing? : A Lot 6 Click Score: 17    End of Session Equipment Utilized During Treatment: Gait belt Activity  Tolerance: Patient tolerated treatment well Patient left: in chair;with chair alarm set;with call bell/phone within reach   PT Visit Diagnosis: Difficulty in walking, not elsewhere classified (R26.2);Unsteadiness on feet (R26.81);Repeated falls (R29.6)     Time: 2992-4268 PT Time Calculation (min) (ACUTE ONLY): 13 min  Charges:  $Gait Training: 8-22 mins                     Carmelia Bake, PT, [place] Office: [phone] Pager: [phone]  Trena Platt 07/10/2019, 4:16 PM

## 2019-07-10 NOTE — Progress Notes (Signed)
Inpatient Rehabilitation Admissions Coordinator  I spoke with patient by phone. Pt's husband works days and Aunt could come a few hrs but not 24/7 when spouse not available. I recommend SNF rehab at this time to give pt recovery time with her humerus fracture, radiation treatments and overall debility. Pt is aware of my recommendation. [employer] will not approve an inpt rehab admission for this diagnosis. I have left voicemail for SW, Meghan of SNF recommendation as well as pt's request for pure wick at home. Patient feels me talking about d/c is premature given that no one else is discussing when she will be ready for d/c.  We will sign off at this time.  Danne Baxter, RN, MSN Rehab Admissions Coordinator [phone] 07/10/2019 12:08 PM

## 2019-07-10 NOTE — Telephone Encounter (Signed)
Scheduled appt per 9/1 sch message - pt is aware of appt date and time

## 2019-07-10 NOTE — Progress Notes (Signed)
Occupational Therapy Treatment Patient Details Name: Hailey Hill MRN: [id number] DOB: July 17, 1951 Today's Date: 07/10/2019    History of present illness Pt is a 68 year old female admitted after mutliple falls at home and with Left arm pain.  X-ray of the left humerus revealed a nondisplaced fracture of humeral neck and head.  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  Pt with medical history significant of small cell carcinoma of the lung diagnosed in 2011.  She is status post multiple courses of chemotherapy   OT comments  Pt in chair upon OT tx.  SLing in good position after OT adjusted.  Pt verbalized understanding of positioning of sling.  Follow Up Recommendations  Home health OT;Follow surgeon's recommendation for DC plan and follow-up therapies;SNF(depending on husbands ability to A)    Equipment Recommendations  3 in 1 bedside commode    Recommendations for Other Services      Precautions / Restrictions Precautions Precautions: Fall Required Braces or Orthoses: Sling Restrictions Weight Bearing Restrictions: Yes LUE Weight Bearing: Non weight bearing Other Position/Activity Restrictions: verbally discussed with Dr. Wyline Copas and he reports f/u with outpatient ortho and keep NWB with sling       Mobility Bed Mobility               General bed mobility comments: Pt OOB  Transfers Overall transfer level: Needs assistance Equipment used: 1 person hand held assist Transfers: Sit to/from Stand Sit to Stand: Min assist         General transfer comment: assist to rise and steady, required use of R UE    Balance Overall balance assessment: History of Falls;Needs assistance         Standing balance support: Single extremity supported Standing balance-Leahy Scale: Poor Standing balance comment: requires UE support and external assist                           ADL either performed or  assessed with clinical judgement   ADL                   Upper Body Dressing : Minimal assistance;Cueing for UE precautions;Adhering to UE precautions;Sitting;Cueing for safety;Cueing for sequencing;Cueing for compensatory techniques Upper Body Dressing Details (indicate cue type and reason): focused on donning and doffing sling.  Pt feels husband will be able to A with this.  Pt verballly able to tell OT steps and correction positioning of the sling.     Toilet Transfer: Minimal assistance;Moderate assistance;BSC;Cueing for safety;Cueing for sequencing   Toileting- Clothing Manipulation and Hygiene: Maximal assistance;Sit to/from stand;Cueing for safety;Cueing for sequencing Toileting - Clothing Manipulation Details (indicate cue type and reason): max A for pants as they are a bit snug. pt states husband will A  with this task. OT reccomended looser pants for ease of getting them up and down                       Cognition Arousal/Alertness: Awake/alert Behavior During Therapy: WFL for tasks assessed/performed Overall Cognitive Status: Within Functional Limits for tasks assessed  Pertinent Vitals/ Pain       Pain Score: 4  Pain Descriptors / Indicators: Sharp;Sore Pain Intervention(s): Limited activity within patient's tolerance;Monitored during session         Frequency  Min 2X/week        Progress Toward Goals  OT Goals(current goals can now be found in the care plan section)  Progress towards OT goals: Progressing toward goals     Plan Discharge plan remains appropriate       AM-PAC OT "6 Clicks" Daily Activity     Outcome Measure   Help from another person eating meals?: A Little Help from another person taking care of personal grooming?: A Little Help from another person toileting, which includes using toliet, bedpan, or urinal?: A Lot Help from another person bathing  (including washing, rinsing, drying)?: A Little Help from another person to put on and taking off regular upper body clothing?: A Little Help from another person to put on and taking off regular lower body clothing?: A Lot 6 Click Score: 16    End of Session Equipment Utilized During Treatment: Other (comment)(sling RUE)  OT Visit Diagnosis: Unsteadiness on feet (R26.81);Other abnormalities of gait and mobility (R26.89);Muscle weakness (generalized) (M62.81);Pain;History of falling (Z91.81) Pain - part of body: Shoulder   Activity Tolerance Patient limited by pain   Patient Left in chair   Nurse Communication Mobility status        Time: 1131-1140 OT Time Calculation (min): 9 min  Charges: OT General Charges $OT Visit: 1 Visit OT Treatments $Self Care/Home Management : 8-22 mins  Kari Baars, [place] [phone] Office- [phone]      Trula Frede, Edwena Felty D 07/10/2019, 12:16 PM

## 2019-07-11 DIAGNOSIS — S42292D Other displaced fracture of upper end of left humerus, subsequent encounter for fracture with routine healing: Secondary | ICD-10-CM

## 2019-07-11 LAB — BASIC METABOLIC PANEL
Anion gap: 11 (ref 5–15)
BUN: 23 mg/dL (ref 8–23)
CO2: 22 mmol/L (ref 22–32)
Calcium: 9.1 mg/dL (ref 8.9–10.3)
Chloride: 106 mmol/L (ref 98–111)
Creatinine, Ser: 0.79 mg/dL (ref 0.44–1.00)
GFR calc Af Amer: >60 mL/min (ref >60)
GFR calc non Af Amer: >60 mL/min (ref >60)
Glucose, Bld: 110 mg/dL — ABNORMAL HIGH (ref 70–99)
Potassium: 4.4 mmol/L (ref 3.5–5.1)
Sodium: 139 mmol/L (ref 135–145)

## 2019-07-11 MED ORDER — SENNA 8.6 MG PO TABS: 1.0000 | ORAL_TABLET | Freq: Two times a day (BID) | ORAL | 0 refills | Status: DC

## 2019-07-11 MED ORDER — ALPRAZOLAM 0.5 MG PO TABS: 0.5000 mg | ORAL_TABLET | ORAL | 0 refills | Status: DC | PRN

## 2019-07-11 MED ORDER — ENOXAPARIN SODIUM 40 MG/0.4ML ~~LOC~~ SOLN: 40.0000 mg | Freq: Every day | SUBCUTANEOUS | Status: DC

## 2019-07-11 MED ORDER — DEXAMETHASONE 4 MG PO TABS: 4.0000 mg | ORAL_TABLET | Freq: Four times a day (QID) | ORAL | 0 refills | Status: DC

## 2019-07-11 MED ORDER — TRAMADOL HCL 50 MG PO TABS: 50.0000 mg | ORAL_TABLET | Freq: Four times a day (QID) | ORAL | 0 refills | Status: DC | PRN

## 2019-07-11 NOTE — TOC Transition Note (Signed)
Transition of Care [facility]) - CM/SW Discharge Note   Patient Details  Name: Hailey Hill MRN: [id number] Date of Birth: December 31, 1950  [facility] CM/SW Contact:  Lia Hopping, [place] Phone Number: 07/11/2019, 2:58 PM   Clinical Narrative:    The facility will transport the patient to her upcoming radiation appointments.CSW notified patient and spouse.  Nurse call report [phone] Room 1202     Final next level of care: Skilled Nursing Facility Barriers to Discharge: Barriers Resolved   Patient Goals and CMS Choice Patient states their goals for this hospitalization and ongoing recovery are:: to get better CMS Medicare.gov Compare Post Acute Care list provided to:: Patient Choice offered to / list presented to : Patient  Discharge Placement PASRR number recieved: 07/11/19            Patient chooses bed at: [facility] Patient to be transferred to facility by: Spouse Name of family member notified: Spouse Patient and family notified of of transfer: 07/11/19  Discharge Plan and Services In-house Referral: Clinical Social Work Discharge Planning Services: CM Consult                                 Social Determinants of Health ([place]) Interventions     Readmission Risk Interventions No flowsheet data found.

## 2019-07-11 NOTE — Discharge Summary (Signed)
Physician Discharge Summary  Hailey Hill IRJ:[id number] DOB: 1950-11-19 DOA: 07/07/2019  PCP: [name], NP  Admit date: 07/07/2019 Discharge date: 07/11/2019  Time spent: 50 minutes  Recommendations for Outpatient Follow-up:  1. Patient will be discharged to skilled nursing facility at [facility] place.  Follow-up with MD at [facility] place. 2. Follow-up with Dr. Eppie Gibson, radiation oncology for first radiation treatment on Tuesday, 07/16/2019. 3. Follow-up with Dr. Lorin Mercy, orthopedics in 1 week for follow-up on left humerus fracture. 4. Follow-up with Dr. Lorna Few, oncology in 2 to 3 weeks.   Discharge Diagnoses:  Principal Problem:   Metastatic cancer to brain [facility]) Active Problems:   Small cell lung cancer (Hailey Hill)   Metastatic lung cancer (metastasis from lung to other site) [facility])   Fracture, humerus, anatomical neck, left, closed, initial encounter   Fall   Closed fracture of head of left humerus   Discharge Condition: Stable and improved  Diet recommendation: Regular  Filed Weights   07/07/19 1235  Weight: 68 kg    History of present illness:  HPI per Dr. Adella Nissen is a 68 y.o. female with medical history significant of small cell carcinoma of the lung diagnosed in 2011.  She is status post multiple courses of chemotherapy.  She has been followed by Dr. Earlie Server with last office visit May 15, 2019.  At that time she was considered to be in remission.  CT the chest showed no progressive or recurrent lesions.  Patient reported in the last 7-10 days she has had 7 falls including a fall the day of admission that resulted in sudden pain in her left arm. She described a loss of balance and loss of control of movement but no loss of consciousness.  She presented to the emergency department for evaluation.  X-ray of the left humerus revealed a nondisplaced fracture of humeral neck and head.  MRI of the brain revealed the patient to have multiple metastatic  lesions including the vermis and cerebellum.  Patient now admitted for pain control, PT/OT eval and for the initiation of steroid treatment for brain metastases with surrounding edema.   ED Course: Patient was hemodynamically stable.  Evaluation did reveal fracture of the left humeral neck and head.  MRI did confirm presence of metastatic lesions to the brain.  The tech placed the patient's left arm in a splint.  Patient was referred for admission   Hospital Course:  1 metastatic lung cancer with new brain mets Patient presented with falls with a left humeral fracture.  MRI of the brain which was done revealed multiple metastatic lesions including the vermis and cerebellum.  Oncology was consulted who had recommended Decadron 4 mg 4 times daily as tolerated.  Patient was started on Decadron.  Patient was placed on fall precautions. Oncology also recommended evaluation by rad oncology for [facility]. Patient subsequently underwent simulation on 07/10/2019.  Patient will follow-up for first radiation treatment on Tuesday, 07/16/2019.  Patient will follow-up with oncology, rate oncology, neuro-surgery in the outpatient setting.  2.  Left humerus fracture  Nondisplaced per ED provider and patient was placed in a sling.  Dr. Wyline Copas discussed findings with Dr. Lorin Mercy who recommended outpatient follow-up with him 1 week post discharge from the hospital.  Patient's pain was managed.  Patient was seen by PT/ OT.  3   fall Secondary to problem #1.  PT/OT.  Patient seen by physical therapy and is noted that patient continues to require assistance for mobility for safety  due to unsteadiness.  PT recommending 24/7 assistance upon DC due to high fall risk versus skilled nursing facility.  Patient has been assessed by inpatient rehab who are recommending SNF placement.  Social work consulted.  Patient will be discharged to skilled nursing facility.   Procedures:  MRI brain 07/09/2019  Consultations:  Radiation  oncology: Eppie Gibson 07/08/2019  CIR  Oncology Curb sided orthopedic surgery, Dr. Lorin Mercy over the phone per Dr. Wyline Copas,   Discharge Exam: Vitals:   07/11/19 0444 07/11/19 1340  BP: 123/75 133/72  Pulse: 66 62  Resp: 20 18  Temp: 98.5 F (36.9 C) 98 F (36.7 C)  SpO2: 96% 99%    General: NAD Cardiovascular: RRR Respiratory: CTAB  Discharge Instructions   Discharge Instructions    Diet general   Complete by: As directed    Increase activity slowly   Complete by: As directed      Allergies as of 07/11/2019      Reactions   Promethazine Hcl Other (See Comments)   Pt reports feeling shaky and "sick" after phenergan      Medication List    TAKE these medications   ALPRAZolam 0.5 MG tablet Commonly known as: XANAX Take 1 tablet (0.5 mg total) by mouth as needed for anxiety (for MRI).   dexamethasone 4 MG tablet Commonly known as: DECADRON Take 1 tablet (4 mg total) by mouth 4 (four) times daily.   enoxaparin 40 MG/0.4ML injection Commonly known as: LOVENOX Inject 0.4 mLs (40 mg total) into the skin at bedtime.   senna 8.6 MG Tabs tablet Commonly known as: SENOKOT Take 1 tablet (8.6 mg total) by mouth 2 (two) times daily.   traMADol 50 MG tablet Commonly known as: ULTRAM Take 1 tablet (50 mg total) by mouth every 6 (six) hours as needed for moderate pain.            Durable Medical Equipment  (From admission, onward)         Start     Ordered   07/10/19 1248  For home use only DME 3 n 1  Once     07/10/19 1247         Allergies  Allergen Reactions  . Promethazine Hcl Other (See Comments)    Pt reports feeling shaky and "sick" after phenergan    Contact information for follow-up providers    Marybelle Killings, MD. Schedule an appointment as soon as possible for a visit in 1 week(s).   Specialty: Orthopedic Surgery Contact information: [place] [address] [phone]        Curt Bears, MD. Schedule an  appointment as soon as possible for a visit in 2 week(s).   Specialty: Oncology Why: Follow-up in 2 to 3 weeks. Contact information: [place] [address] [phone]        Eppie Gibson, MD Follow up on 07/16/2019.   Specialty: Radiation Oncology Why: Follow-up as scheduled for radiation treatment. Contact information: [place]. [place] [address] [phone]            Contact information for after-discharge care    Destination    [facility] .   Service: Skilled Nursing Contact information: [place] [phone]                   The results of significant diagnostics from this hospitalization (including imaging, microbiology, ancillary and laboratory) are listed below  for reference.    Significant Diagnostic Studies: Dg Ribs Unilateral W/chest Right  Result Date: 07/07/2019 CLINICAL DATA:  Pain after fall EXAM: RIGHT RIBS AND CHEST - 3+ VIEW COMPARISON:  None. FINDINGS: The cardiomediastinal silhouette is stable. Post radiation changes in the left perihilar region are stable. No pneumothorax. No nodules or masses. Comminuted fracture of the proximal humerus described on dedicated images. No obvious right rib fractures are identified. IMPRESSION: No rib fractures noted. Electronically Signed   By: Dorise Bullion III M.D   On: 07/07/2019 14:48   Dg Lumbar Spine Complete  Result Date: 07/07/2019 CLINICAL DATA:  Pain after fall EXAM: LUMBAR SPINE - COMPLETE 4+ VIEW COMPARISON:  None. FINDINGS: No malalignment. No fractures. Degenerative changes are seen at L4-5. Atherosclerotic changes are seen in the abdominal aorta. IMPRESSION: No acute abnormalities. Electronically Signed   By: Dorise Bullion III M.D   On: 07/07/2019 14:49   Mr Brain W Wo Contrast  Result Date: 07/10/2019 CLINICAL DATA:  Small cell lung cancer.  Known brain metastases. EXAM: MRI HEAD WITHOUT AND  WITH CONTRAST TECHNIQUE: Multiplanar, multiecho pulse sequences of the brain and surrounding structures were obtained without and with intravenous contrast. CONTRAST:  7 mL Gadavist COMPARISON:  1.5 tesla MRI head without and with contrast 07/07/2019. MR head without and with contrast 11/26/2010 and 07/13/2010 FINDINGS: Brain: A peripherally enhancing mass lesion fall vein the inferior cerebellar vermis and medial right cerebellar hemisphere measures 3.3 x 3.4 x 2.5 cm. A second lesion is present in the superior cerebellar vermis with a similar heterogeneous enhancement pattern, but less prominent central necrosis. The second lesion measures 2.2 x 2.2 x 1.4 cm No supratentorial foci of enhancement are present. There is mild prominence of the lateral ventricles and third ventricles. This is likely secondary to mass effect from the lesion in the superior vermis on the aqueduct of Sylvius. Mild periventricular white matter changes are present. Extensive T2 signal changes surround the cerebellar lesions. Fourth ventricle is of normal size. Blood products or calcification are noted within both enhancing lesions. No other significant hemorrhage is present. The internal auditory canals are within normal limits. Mild white matter changes are noted in the brainstem. Vascular: Flow is present in the major intracranial arteries. Skull and upper cervical spine: The craniocervical junction is normal. Upper cervical spine is within normal limits. Marrow signal is unremarkable. Sinuses/Orbits: Small polyps or mucous retention cysts are noted along the inferior maxillary sinuses bilaterally. There is minimal fluid in the left mastoid air cells. No obstructing nasopharyngeal lesion is present. The paranasal sinuses and mastoid air cells are otherwise clear. The globes and orbits are within normal limits. IMPRESSION: 1. Heterogeneous enhancing lesions in the cerebellum as described consistent with metastatic disease. 2. Mass effect  on the aqueduct of Sylvius with mild dilation of the lateral ventricles and third ventricle. 3. Moderate surrounding vasogenic edema in the cerebellum. Cerebellar lesions and edema likely correlate with the patient's recent falls. 4. No supratentorial metastases. Electronically Signed   By: San Morelle M.D.   On: 07/10/2019 06:00   Mr Jeri Cos LF Contrast  Result Date: 07/07/2019 CLINICAL DATA:  Dizziness.  History of small cell lung cancer. EXAM: MRI HEAD WITHOUT AND WITH CONTRAST TECHNIQUE: Multiplanar, multiecho pulse sequences of the brain and surrounding structures were obtained without and with intravenous contrast. CONTRAST:  Gadavist 7.5 mL. COMPARISON:  MRI brain 11/26/2010. FINDINGS: Brain: Two intracranial mass lesions are present with abnormal enhancement and central necrosis. The smaller lesion  is in the superior vermis, approximately 1.5 x 2 x 2 cm. The larger lesion is in the midline to slight inferior vermis extending into the RIGHT cerebellar hemisphere, approximately 2.5 x 3 x 3 cm. There is moderate surrounding edema. And effacement of the fourth ventricle. Elsewhere there is atrophy with prominence of the cisterns and sulci. Ventriculomegaly is favored to represent central atrophy rather than obstructive hydrocephalus. Vascular: Normal flow voids. Skull and upper cervical spine: Normal marrow signal. Sinuses/Orbits: Chronic sphenoid sinusitis.  Negative orbits. Other: Unremarkable. 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 There is moderate surrounding edema and effacement the fourth ventricle. Untreated, the patient is at risk for obstructive hydrocephalus. Neurosurgical consultation may be warranted. These results were called by telephone at the time of interpretation on 07/07/2019 at 5:44 pm to Dr. Eulis Foster who verbally acknowledged these results. Electronically Signed   By: Staci Righter M.D.   On: 07/07/2019 17:45   Dg  Shoulder Left  Result Date: 07/07/2019 CLINICAL DATA:  Pain after fall. EXAM: LEFT SHOULDER - 2+ VIEW COMPARISON:  None. FINDINGS: There is a comminuted fracture through the humeral head and neck without significant displacement. No dislocation identified. Clavicle and scapular grossly intact. Limited views of the left chest are normal. IMPRESSION: Comminuted fracture of the humeral head and neck without significant displacement or dislocation. Electronically Signed   By: Dorise Bullion III M.D   On: 07/07/2019 14:45   Dg Humerus Left  Result Date: 07/07/2019 CLINICAL DATA:  Pain after fall EXAM: LEFT HUMERUS - 2+ VIEW COMPARISON:  None. FINDINGS: Again noted is a nondisplaced comminuted fracture of the left humeral head and neck. The remainder of the humerus is intact. IMPRESSION: Nondisplaced comminuted fracture of the left humeral head and neck. The remainder of the humerus is intact. Electronically Signed   By: Dorise Bullion III M.D   On: 07/07/2019 14:46    Microbiology: Recent Results (from the past 240 hour(s))  SARS Coronavirus 2 [facility] order, Performed in [facility] hospital lab) Nasopharyngeal Nasopharyngeal Swab     Status: None   Collection Time: 07/07/19  6:44 PM   Specimen: Nasopharyngeal Swab  Result Value Ref Range Status   SARS Coronavirus 2 NEGATIVE NEGATIVE Final    Comment: (NOTE) If result is NEGATIVE SARS-CoV-2 target nucleic acids are NOT DETECTED. The SARS-CoV-2 RNA is generally detectable in upper and lower  respiratory specimens during the acute phase of infection. The lowest  concentration of SARS-CoV-2 viral copies this assay can detect is 250  copies / mL. A negative result does not preclude SARS-CoV-2 infection  and should not be used as the sole basis for treatment or other  patient management decisions.  A negative result may occur with  improper specimen collection / handling, submission of specimen other  than nasopharyngeal swab, presence of viral  mutation(s) within the  areas targeted by this assay, and inadequate number of viral copies  (<250 copies / mL). A negative result must be combined with clinical  observations, patient history, and epidemiological information. If result is POSITIVE SARS-CoV-2 target nucleic acids are DETECTED. The SARS-CoV-2 RNA is generally detectable in upper and lower  respiratory specimens dur ing the acute phase of infection.  Positive  results are indicative of active infection with SARS-CoV-2.  Clinical  correlation with patient history and other diagnostic information is  necessary to determine patient infection status.  Positive results do  not rule out bacterial infection or co-infection with other  viruses. If result is PRESUMPTIVE POSTIVE SARS-CoV-2 nucleic acids MAY BE PRESENT.   A presumptive positive result was obtained on the submitted specimen  and confirmed on repeat testing.  While 2019 novel coronavirus  (SARS-CoV-2) nucleic acids may be present in the submitted sample  additional confirmatory testing may be necessary for epidemiological  and / or clinical management purposes  to differentiate between  SARS-CoV-2 and other Sarbecovirus currently known to infect humans.  If clinically indicated additional testing with an alternate test  methodology [phone]) is advised. The SARS-CoV-2 RNA is generally  detectable in upper and lower respiratory sp ecimens during the acute  phase of infection. The expected result is Negative. Fact Sheet for Patients:  StrictlyIdeas.no Fact Sheet for Healthcare Providers: BankingDealers.co.za This test is not yet approved or cleared by the [place] FDA and has been authorized for detection and/or diagnosis of SARS-CoV-2 by FDA under an Emergency Use Authorization (EUA).  This EUA will remain in effect (meaning this test can be used) for the duration of the COVID-19 declaration under Section 564(b)(1)  of the Act, 21 U.S.C. section 360bbb-3(b)(1), unless the authorization is terminated or revoked sooner. Performed at [facility], [place] [address] [place], [place] [address]      Labs: Basic Metabolic Panel: Recent Labs  Lab 07/07/19 1322 07/07/19 2112 07/11/19 0555  NA 140  --  139  K 3.9  --  4.4  CL 104  --  106  CO2 25  --  22  GLUCOSE 123*  --  110*  BUN 15  --  23  CREATININE 0.72 0.69 0.79  CALCIUM 9.3  --  9.1   Liver Function Tests: No results for input(s): AST, ALT, ALKPHOS, BILITOT, PROT, ALBUMIN in the last 168 hours. No results for input(s): LIPASE, AMYLASE in the last 168 hours. No results for input(s): AMMONIA in the last 168 hours. CBC: Recent Labs  Lab 07/07/19 1322 07/07/19 2112  WBC 11.3* 11.2*  NEUTROABS 9.3*  --   HGB 12.8 12.8  HCT 38.3 37.6  MCV 91.4 90.8  PLT 242 244   Cardiac Enzymes: No results for input(s): CKTOTAL, CKMB, CKMBINDEX, TROPONINI in the last 168 hours. BNP: BNP (last 3 results) No results for input(s): BNP in the last 8760 hours.  ProBNP (last 3 results) No results for input(s): PROBNP in the last 8760 hours.  CBG: No results for input(s): GLUCAP in the last 168 hours.     Signed:  Irine Seal MD.  [facility] 07/11/2019, 3:27 PM

## 2019-07-11 NOTE — Progress Notes (Addendum)
Occupational Therapy Treatment Patient Details Name: Hailey Hill MRN: [id number] DOB: [birth year] Today's Date: 07/11/2019    History of present illness Pt is a 68 year old female admitted after mutliple falls at home and with Left arm pain.  X-ray of the left humerus revealed a nondisplaced fracture of humeral neck and head.  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  Pt with medical history significant of small cell carcinoma of the lung diagnosed in 2011.  She is status post multiple courses of chemotherapy   OT comments  Pt in chair and a bit teary this morning.  Pt worried about DC plan and declines rehab.  OT session focused on sling education, going over handout as well as donning tank top with keeping L shoulder immobilized   Follow Up Recommendations  Home health OT;Follow surgeon's recommendation for DC plan and follow-up therapies;Supervision/Assistance - 24 hour(depending on husbands ability to A)   Per pt she does not want SNF and wants to go home.  Pt will need 24/7A.      Equipment Recommendations  3 in 1 bedside commode    Recommendations for Other Services      Precautions / Restrictions Precautions Precautions: Fall Restrictions Other Position/Activity Restrictions: verbally discussed with Dr. Wyline Copas and he reports f/u with outpatient ortho and keep NWB with sling       Mobility Bed Mobility               General bed mobility comments: pt up in recliner  Transfers Overall transfer level: Needs assistance   Transfers: Sit to/from Stand;Stand Pivot Transfers Sit to Stand: Min assist Stand pivot transfers: Min assist            Balance Overall balance assessment: History of Falls;Needs assistance         Standing balance support: Single extremity supported Standing balance-Leahy Scale: Poor Standing balance comment: requires UE support and external assist                            ADL either performed or assessed with clinical judgement   ADL       Grooming: Set up;Sitting Grooming Details (indicate cue type and reason): with RUE         Upper Body Dressing : Minimal assistance;Cueing for UE precautions;Adhering to UE precautions;Sitting;Cueing for safety;Cueing for sequencing;Cueing for compensatory techniques                     General ADL Comments: Pt teary this day.  Pt feeling emotional about DC plan. Pt does not want to go to rehab. Pt wants to go  home and states between aunt and husband they will figure it out.     Vision Patient Visual Report: No change from baseline            Cognition Arousal/Alertness: Awake/alert Behavior During Therapy: WFL for tasks assessed/performed Overall Cognitive Status: Within Functional Limits for tasks assessed                                          Exercises General Exercises - Upper Extremity Elbow Extension: AAROM;Left;5 reps;Seated Wrist Flexion: 10 reps;Seated;Left;AROM Wrist Extension: AROM;Left;10 reps;Seated Digit Composite Flexion: 10 reps;Left Composite Extension: AROM;10 reps;Seated;Left   Shoulder Instructions  Pertinent Vitals/ Pain       Pain Score: 4  Pain Location: shoulder with taking sling on and off Pain Descriptors / Indicators: Sore;Tender Pain Intervention(s): Limited activity within patient's tolerance         Frequency  Min 2X/week        Progress Toward Goals  OT Goals(current goals can now be found in the care plan section)        Plan Discharge plan needs to be updated(pt declines rehab- wants to go home)       AM-PAC OT "6 Clicks" Daily Activity     Outcome Measure   Help from another person eating meals?: A Little Help from another person taking care of personal grooming?: A Little Help from another person toileting, which includes using toliet, bedpan, or urinal?: A Lot Help from another  person bathing (including washing, rinsing, drying)?: A Little Help from another person to put on and taking off regular upper body clothing?: A Little Help from another person to put on and taking off regular lower body clothing?: A Lot 6 Click Score: 16    End of Session Equipment Utilized During Treatment: Other (comment)(sling RUE)  OT Visit Diagnosis: Unsteadiness on feet (R26.81);Other abnormalities of gait and mobility (R26.89);Muscle weakness (generalized) (M62.81);Pain;History of falling (Z91.81) Pain - part of body: Shoulder   Activity Tolerance Patient tolerated treatment well   Patient Left in chair   Nurse Communication Mobility status        Time: 1013-1030 OT Time Calculation (min): 17 min  Charges: OT General Charges $OT Visit: 1 Visit OT Treatments $Self Care/Home Management : 8-22 mins  Kari Baars, [place] [phone] Office- [place], Edwena Felty D 07/11/2019, 12:56 PM

## 2019-07-11 NOTE — NC FL2 (Addendum)
[place] LEVEL OF CARE SCREENING TOOL     IDENTIFICATION  Patient Name: Hailey Hill Birthdate: [birth year] Sex: female Admission Date (Current Location): 07/07/2019  [facility] and [place] Number:  Herbalist and Address:  [facility],  [address], [place]      Provider Number: [id number]  Attending Physician Name and Address:  Eugenie Filler, MD  Relative Name and Phone Number:       Current Level of Care: Hospital Recommended Level of Care: [place] Prior Approval Number:    Date Approved/Denied:   PASRR Number:   [id number]  Discharge Plan: SNF    Current Diagnoses: Patient Active Problem List   Diagnosis Date Noted  . Fall 07/10/2019  . Closed fracture of head of left humerus   . Metastatic lung cancer (metastasis from lung to other site) ([place]) 07/07/2019  . Fracture, humerus, anatomical neck, left, closed, initial encounter 07/07/2019  . Metastatic cancer to brain ([place]) 07/07/2019  . Chest pain with moderate risk for cardiac etiology 12/14/2015  . Small cell lung cancer ([place]) 09/16/2013    Orientation RESPIRATION BLADDER Height & Weight     Self, Time, Situation, Place  Normal Incontinent Weight: 150 lb (68 kg) Height:  5\' 8"  (172.7 cm)  BEHAVIORAL SYMPTOMS/MOOD NEUROLOGICAL BOWEL NUTRITION STATUS      Continent Diet(Regular diet)  AMBULATORY STATUS COMMUNICATION OF NEEDS Skin   Extensive Assist Verbally Bruising(Arm, Back,)                       Personal Care Assistance Level of Assistance  Bathing, Dressing, Feeding Bathing Assistance: Maximum assistance Feeding assistance: Independent Dressing Assistance: Maximum assistance     Functional Limitations Info  Sight, Speech, Hearing Sight Info: Adequate Hearing Info: Adequate Speech Info: Adequate    SPECIAL CARE FACTORS FREQUENCY  PT (By licensed PT), OT (By licensed OT)     PT Frequency: 5x/week OT Frequency:  5x/week            Contractures Contractures Info: Not present    Additional Factors Info  Code Status, Allergies, Psychotropic, Insulin Sliding Scale Code Status Info: Fullcode Allergies Info: Allergies: Promethazine Hcl           Current Medications (07/11/2019):  This is the current hospital active medication list Current Facility-Administered Medications  Medication Dose Route Frequency Provider Last Rate Last Dose  . acetaminophen (TYLENOL) tablet 650 mg  650 mg Oral Q6H PRN Norins, Heinz Knuckles, MD   650 mg at 07/11/19 1051   Or  . acetaminophen (TYLENOL) suppository 650 mg  650 mg Rectal Q6H PRN Norins, Heinz Knuckles, MD      . ALPRAZolam Duanne Moron) tablet 0.5 mg  0.5 mg Oral PRN Donne Hazel, MD   0.5 mg at 07/10/19 1402  . dexamethasone (DECADRON) tablet 4 mg  4 mg Oral QID Norins, Heinz Knuckles, MD   4 mg at 07/11/19 1051  . enoxaparin (LOVENOX) injection 40 mg  40 mg Subcutaneous QHS Norins, Heinz Knuckles, MD   40 mg at 07/10/19 2147  . senna (SENOKOT) tablet 8.6 mg  1 tablet Oral BID Neena Rhymes, MD   8.6 mg at 07/10/19 2147  . traMADol (ULTRAM) tablet 50 mg  50 mg Oral Q6H PRN Norins, Heinz Knuckles, MD   50 mg at 07/09/19 1613     Discharge Medications: Please see discharge summary for a list of discharge medications.  Relevant Imaging  Results:  Relevant Lab Results:   Additional Information [id number]  Lia Hopping, LCSW

## 2019-07-11 NOTE — TOC Initial Note (Signed)
Transition of Care [facility]) - Initial/Assessment Note    Patient Details  Name: Hailey Hill MRN: [id number] Date of Birth: 1951/06/26  Transition of Care [facility]) CM/SW Contact:    Lia Hopping, [place] Phone Number: 07/11/2019, 2:46 PM  Clinical Narrative:     CIR unable to accept patient due to insurance.            CSW met with the patient and spouse at bedside, explain role and reason for the visit. Patient reports she prefers to go home but understands she cannot stay at home alone while her spouse is at work. Patient express prior to this hospitalization, " I was a normal person, I did everything for myself. I am a avid walker, I walk 4 miles a day." Patient reports after discussing options wither her spouse, it will be safer for the patient to discharge to rehab.Patient states she prefers a facility close to her home.    CSW explain the SNF process and presented SNF bed offers on [email or web address] Patient agreeable to [facility].  CSW started Authorization with Marella Bile Medicare Authorization received: [id number] 6 days start 9/3- next review date 9/8.    Expected Discharge Plan: Skilled Nursing Facility Barriers to Discharge: Barriers Resolved   Patient Goals and CMS Choice Patient states their goals for this hospitalization and ongoing recovery are:: to get better CMS Medicare.gov Compare Post Acute Care list provided to:: Patient Choice offered to / list presented to : Patient  Expected Discharge Plan and Services Expected Discharge Plan: [place] In-house Referral: Clinical Social Work Discharge Planning Services: CM Consult   Living arrangements for the past 2 months: [place]                                      Prior Living Arrangements/Services Living arrangements for the past 2 months: Single Family Home Lives with:: Spouse Patient language and need for interpreter reviewed:: Yes Do you feel safe going back to the place  where you live?: Yes      Need for Family Participation in Patient Care: Yes (Comment) Care giver support system in place?: Yes (comment)   Criminal Activity/Legal Involvement Pertinent to Current Situation/Hospitalization: No - Comment as needed  Activities of Daily Living Home Assistive Devices/Equipment: None ADL Screening (condition at time of admission) Patient's cognitive ability adequate to safely complete daily activities?: No Is the patient deaf or have difficulty hearing?: No Does the patient have difficulty seeing, even when wearing glasses/contacts?: No Does the patient have difficulty concentrating, remembering, or making decisions?: Yes Patient able to express need for assistance with ADLs?: Yes Does the patient have difficulty dressing or bathing?: Yes Independently performs ADLs?: Yes (appropriate for developmental age) Does the patient have difficulty walking or climbing stairs?: Yes Weakness of Legs: Both Weakness of Arms/Hands: Left  Permission Sought/Granted Permission sought to share information with : Family Supports, Case Manager Permission granted to share information with : Yes, Verbal Permission Granted     Permission granted to share info w AGENCY: SNF in [place] granted to share info w Relationship: Spouse     Emotional Assessment Appearance:: Appears stated age   Affect (typically observed): Accepting, Calm, Pleasant Orientation: : Oriented to Self, Oriented to Place, Oriented to  Time, Oriented to Situation Alcohol / Substance Use: Not Applicable Psych Involvement: No (comment)  Admission diagnosis:  Closed fracture of head  of left humerus, initial encounter [S42.292A] Multiple lesions of metastatic malignancy [facility]) [C79.9] Patient Active Problem List   Diagnosis Date Noted  . Fall 07/10/2019  . Closed fracture of head of left humerus   . Metastatic lung cancer (metastasis from lung to other site) ([place]) 07/07/2019  . Fracture,  humerus, anatomical neck, left, closed, initial encounter 07/07/2019  . Metastatic cancer to brain ([place]) 07/07/2019  . Chest pain with moderate risk for cardiac etiology 12/14/2015  . Small cell lung cancer ([place]) 09/16/2013   PCP:  [name], NP Pharmacy:   [id number] [place] [place] - [id number]: [phone]: [phone]    Social Determinants of Health (SDOH) Interventions    Readmission Risk Interventions No flowsheet data found.

## 2019-07-11 NOTE — Progress Notes (Signed)
Pts IV removed with a clean and dry dressing intact. Pt denies pain at the time of d/c. Report called to [facility], pt to transport by her husband to [facility]. All instructions and medications were placed in d/c packet and given to pt.

## 2019-07-12 NOTE — Progress Notes (Signed)
  Radiation Oncology         [phone] ________________________________  Name: Hailey Hill MRN: [id number]  Date: 07/10/2019  DOB: 07/05/1951  SIMULATION AND TREATMENT PLANNING NOTE; SPECIAL TREATMENT PROCEDURE NOTE:   Inpatient  DIAGNOSIS:    ICD-10-CM   1. Metastatic cancer to brain [facility])  C79.31     NARRATIVE:  The patient was brought to the [place].  Identity was confirmed.  All relevant records and images related to the planned course of therapy were reviewed.  The patient freely provided informed written consent to proceed with treatment after reviewing the details related to the planned course of therapy. The consent form was witnessed and verified by the simulation staff. Intravenous access was established for contrast administration. Then, the patient was set-up in a stable reproducible supine position for radiation therapy.  A relocatable thermoplastic stereotactic head frame was fabricated for precise immobilization.  CT images were obtained.  Surface markings were placed.  The CT images were loaded into the planning software and fused with the patient's targeting MRI scan.  Then the target and avoidance structures were contoured.  Treatment planning then occurred.  The radiation prescription was entered and confirmed.  I have requested 3D planning  I have requested a DVH of the following structures: Brain stem, brain, left eye, right ete, lenses, optic chiasm, target volumes, uninvolved brain, and normal tissue.    PLAN:  The patient will receive 27 Gy in 3 fractions to the two cerebellar metastases. Stereotactic radiosurgery will be used.  SPECIAL TREATMENT PROCEDURE NOTE:   This constitutes a special treatment procedure due to the ablative dose that will be delivered and the technical nature of treatment.  This highly technical modality of treatment ensures that the ablative dose is centered on the patient's tumor while sparing normal tissues from excessive  dose and risk of detrimental effects.  Patient has been referred to neurology, Dr Mickeal Skinner.   -----------------------------------  Eppie Gibson, MD

## 2019-07-16 ENCOUNTER — Ambulatory Visit
Admission: RE | Admit: 2019-07-16 | Discharge: 2019-07-16 | Disposition: A | Discharge status: Home / Self Care | Payer: Medicare HMO | Source: Ambulatory Visit | Attending: Radiation Oncology | Admitting: Radiation Oncology

## 2019-07-16 ENCOUNTER — Other Ambulatory Visit: Payer: Self-pay

## 2019-07-16 DIAGNOSIS — Z510 Encounter for antineoplastic radiation therapy: Secondary | ICD-10-CM | POA: Diagnosis present

## 2019-07-16 DIAGNOSIS — C3490 Malignant neoplasm of unspecified part of unspecified bronchus or lung: Secondary | ICD-10-CM | POA: Diagnosis not present

## 2019-07-16 DIAGNOSIS — C7931 Secondary malignant neoplasm of brain: Secondary | ICD-10-CM | POA: Diagnosis not present

## 2019-07-16 DIAGNOSIS — Z87891 Personal history of nicotine dependence: Secondary | ICD-10-CM | POA: Diagnosis not present

## 2019-07-16 NOTE — Procedures (Signed)
  Name: Hailey Hill  MRN: [id number]  Date: 07/16/2019   DOB: 22-Jul-1951  Stereotactic Radiosurgery Operative Note  PRE-OPERATIVE DIAGNOSIS:  Multiple Brain Metastases  POST-OPERATIVE DIAGNOSIS:  Multiple Brain Metastases  PROCEDURE:  Stereotactic Radiosurgery  SURGEON:  Tomas Schamp P, MD  NARRATIVE: The patient underwent a radiation treatment planning session in the radiation oncology simulation suite under the care of the radiation oncology physician and physicist.  I participated closely in the radiation treatment planning afterwards. The patient underwent planning CT which was fused to 3T high resolution MRI with 1 mm axial slices.  These images were fused on the planning system.  We contoured the gross target volumes and subsequently expanded this to yield the Planning Target Volume. I actively participated in the planning process.  I helped to define and review the target contours and also the contours of the optic pathway, eyes, brainstem and selected nearby organs at risk.  All the dose constraints for critical structures were reviewed and compared to AAPM Task Group 101.  The prescription dose conformity was reviewed.  I approved the plan electronically.    Accordingly, Hailey Hill was brought to the TrueBeam stereotactic radiation treatment linac and placed in the custom immobilization mask.  The patient was aligned according to the IR fiducial markers with BrainLab Exactrac, then orthogonal x-rays were used in ExacTrac with the 6DOF robotic table and the shifts were made to align the patient  Hailey Hill received stereotactic radiosurgery uneventfully.    Lesions treated:  2   Complex lesions treated:  1 (>3.5 cm, <56mm of optic path, or within the brainstem)   The detailed description of the procedure is recorded in the radiation oncology procedure note.  I was present for the duration of the procedure.  DISPOSITION:  Following delivery, the patient was transported to nursing  in stable condition and monitored for possible acute effects to be discharged to home in stable condition with follow-up in one month.  Jayra Choyce P, MD 07/16/2019 3:26 PM

## 2019-07-19 ENCOUNTER — Other Ambulatory Visit: Payer: Self-pay

## 2019-07-19 ENCOUNTER — Ambulatory Visit
Admission: RE | Admit: 2019-07-19 | Discharge: 2019-07-19 | Disposition: A | Discharge status: Home / Self Care | Payer: Medicare HMO | Source: Ambulatory Visit | Attending: Radiation Oncology | Admitting: Radiation Oncology

## 2019-07-19 VITALS — BP 104/61 | HR 63 | Temp 98.7°F | Resp 18

## 2019-07-19 DIAGNOSIS — Z510 Encounter for antineoplastic radiation therapy: Secondary | ICD-10-CM | POA: Diagnosis not present

## 2019-07-19 DIAGNOSIS — C7931 Secondary malignant neoplasm of brain: Secondary | ICD-10-CM

## 2019-07-19 NOTE — Progress Notes (Signed)
  Radiation Oncology         [phone] ________________________________ Outpatient   Stereotactic Treatment Procedure Note   ICD-10-CM   1. Metastatic cancer to brain [facility])  C79.31      Name: RINOA GARRAMONE MRN: [id number]  Date: 07/19/2019  DOB: 16-Jul-1951  SPECIAL TREATMENT PROCEDURE  3D TREATMENT PLANNING AND DOSIMETRY:  The patient's radiation plan was reviewed and approved by neurosurgery and radiation oncology prior to treatment.  It showed 3-dimensional radiation distributions overlaid onto the planning CT/MRI image set.  The [facility] for the target structures as well as the organs at risk were reviewed. The documentation of the 3D plan and dosimetry are filed in the radiation oncology EMR.  NARRATIVE:  Hailey Hill was brought to the TrueBeam stereotactic radiation treatment machine and placed supine on the CT couch. The head frame was applied, and the patient was set up for stereotactic radiosurgery.  Neurosurgery was present for the set-up and delivery  SIMULATION VERIFICATION:  In the couch zero-angle position, the patient underwent Exactrac imaging using the Brainlab system with orthogonal KV images.  These were carefully aligned and repeated to confirm treatment position for each of the isocenters.  The Exactrac snap film verification was repeated at each couch angle.  SPECIAL TREATMENT PROCEDURE: Rolinda Roan Guisinger received stereotactic radiosurgery to the following targets: PTV1 Rt Cerebellum 34mm and PTV2 Sup Cerebellum 14mm target was treated using 4 Rapid Arc VMAT Beams to a prescription dose of 9 Gy (2nd of three fractions, to total 27 Gy ultimately). ExacTrac Snap verification was performed for each couch angle.   This constitutes a special treatment procedure due to the ablative dose delivered and the technical nature of treatment.  This highly technical modality of treatment ensures that the ablative dose is centered on the patient's tumor while sparing normal tissues from  excessive dose and risk of detrimental effects.  STEREOTACTIC TREATMENT MANAGEMENT:  Following delivery, the patient was transported to nursing in stable condition and monitored for possible acute effects.  Vital signs were recorded BP 104/61   Pulse 63   Temp 98.7 F (37.1 C)   Resp 18   SpO2 100% . The patient tolerated treatment without significant acute effects, and was discharged to home in stable condition.    PLAN: Follow-up next week for final  fraction  ________________________________   Eppie Gibson, MD

## 2019-07-19 NOTE — Progress Notes (Signed)
  Radiation Oncology         [phone] ________________________________ Outpatient   Stereotactic Treatment Procedure Note   ICD-10-CM   1. Metastatic cancer to brain [facility])  C79.31      Name: Hailey Hill MRN: [id number]  Date: 07/16/2019  DOB: May 11, 1951  SPECIAL TREATMENT PROCEDURE  3D TREATMENT PLANNING AND DOSIMETRY:  The patient's radiation plan was reviewed and approved by neurosurgery and radiation oncology prior to treatment.  It showed 3-dimensional radiation distributions overlaid onto the planning CT/MRI image set.  The [facility] for the target structures as well as the organs at risk were reviewed. The documentation of the 3D plan and dosimetry are filed in the radiation oncology EMR.  NARRATIVE:  Hailey Hill was brought to the TrueBeam stereotactic radiation treatment machine and placed supine on the CT couch. The head frame was applied, and the patient was set up for stereotactic radiosurgery.  Neurosurgery was present for the set-up and delivery  SIMULATION VERIFICATION:  In the couch zero-angle position, the patient underwent Exactrac imaging using the Brainlab system with orthogonal KV images.  These were carefully aligned and repeated to confirm treatment position for each of the isocenters.  The Exactrac snap film verification was repeated at each couch angle.  SPECIAL TREATMENT PROCEDURE: Hailey Hill received stereotactic radiosurgery to the following targets: PTV1 Rt Cerebellum 57mm and PTV2 Sup Cerebellum 22mm target was treated using 4 Rapid Arc VMAT Beams to a prescription dose of 9 Gy (first of three fractions, to total 27 Gy ultimately). ExacTrac Snap verification was performed for each couch angle.   This constitutes a special treatment procedure due to the ablative dose delivered and the technical nature of treatment.  This highly technical modality of treatment ensures that the ablative dose is centered on the patient's tumor while sparing normal tissues from  excessive dose and risk of detrimental effects.  STEREOTACTIC TREATMENT MANAGEMENT:  Following delivery, the patient was transported to nursing in stable condition and monitored for possible acute effects.  Vital signs were recorded There were no vitals taken for this visit.. The patient tolerated treatment without significant acute effects, and was discharged to home in stable condition.    PLAN: Follow-up later this week for next fraction  ________________________________   Eppie Gibson, MD

## 2019-07-19 NOTE — Progress Notes (Signed)
Patient in after [place] she rested with [place] for 15 minutes. She is aware of when and whom to call if she has any issues. Questions answered and concerns addressed prior to her leaving.BP 104/61   Pulse 63   Temp 98.7 F (37.1 C)   Resp 18   SpO2 100%  Brandt Loosen RN

## 2019-07-19 NOTE — Patient Instructions (Signed)
Coronavirus (COVID-19) Are you at risk?  Are you at risk for the Coronavirus (COVID-19)?  To be considered HIGH RISK for Coronavirus (COVID-19), you have to meet the following criteria:  . Traveled to [place], [place], [place], [place] or [place]; or in the [place] to [place], [place], [place], or [place]; and have fever, cough, and shortness of breath within the last 2 weeks of travel OR . Been in close contact with a person diagnosed with COVID-19 within the last 2 weeks and have fever, cough, and shortness of breath . IF YOU DO NOT MEET THESE CRITERIA, YOU ARE CONSIDERED LOW RISK FOR COVID-19.  What to do if you are HIGH RISK for COVID-19?  . If you are having a medical emergency, call 911. . Seek medical care right away. Before you go to a doctor's office, urgent care or emergency department, call ahead and tell them about your recent travel, contact with someone diagnosed with COVID-19, and your symptoms. You should receive instructions from your physician's office regarding next steps of care.  . When you arrive at healthcare provider, tell the healthcare staff immediately you have returned from visiting [place], [place], [place], [place] or [place]; or traveled in the [place] to [place], [place], [place], or [place]; in the last two weeks or you have been in close contact with a person diagnosed with COVID-19 in the last 2 weeks.   . Tell the health care staff about your symptoms: fever, cough and shortness of breath. . After you have been seen by a medical provider, you will be either: o Tested for (COVID-19) and discharged home on quarantine except to seek medical care if symptoms worsen, and asked to  - Stay home and avoid contact with others until you get your results (4-5 days)  - Avoid travel on public transportation if possible (such as bus, train, or airplane) or o Sent to the Emergency Department by EMS for evaluation, COVID-19 testing, and possible  admission depending on your condition and test results.  What to do if you are LOW RISK for COVID-19?  Reduce your risk of any infection by using the same precautions used for avoiding the common cold or flu:  . Wash your hands often with soap and warm water for at least 20 seconds.  If soap and water are not readily available, use an alcohol-based hand sanitizer with at least 60% alcohol.  . If coughing or sneezing, cover your mouth and nose by coughing or sneezing into the elbow areas of your shirt or coat, into a tissue or into your sleeve (not your hands). . Avoid shaking hands with others and consider head nods or verbal greetings only. . Avoid touching your eyes, nose, or mouth with unwashed hands.  . Avoid close contact with people who are sick. . Avoid places or events with large numbers of people in one location, like concerts or sporting events. . Carefully consider travel plans you have or are making. . If you are planning any travel outside or inside the [place], visit the CDC's Travelers' Health webpage for the latest health notices. . If you have some symptoms but not all symptoms, continue to monitor at home and seek medical attention if your symptoms worsen. . If you are having a medical emergency, call 911.   ADDITIONAL HEALTHCARE OPTIONS FOR PATIENTS  [place] Telehealth / e-Visit: [email or web address]         [facility]: [phone]  [facility]: [phone]                   [facility]: [phone]   

## 2019-07-22 ENCOUNTER — Encounter: Payer: Self-pay | Admitting: Radiation Oncology

## 2019-07-22 ENCOUNTER — Ambulatory Visit
Admission: RE | Admit: 2019-07-22 | Discharge: 2019-07-22 | Disposition: A | Discharge status: Home / Self Care | Payer: Medicare HMO | Source: Ambulatory Visit | Attending: Radiation Oncology | Admitting: Radiation Oncology

## 2019-07-22 ENCOUNTER — Other Ambulatory Visit: Payer: Self-pay

## 2019-07-22 VITALS — BP 106/69 | HR 69 | Temp 98.2°F

## 2019-07-22 DIAGNOSIS — Z510 Encounter for antineoplastic radiation therapy: Secondary | ICD-10-CM | POA: Diagnosis not present

## 2019-07-22 DIAGNOSIS — C7931 Secondary malignant neoplasm of brain: Secondary | ICD-10-CM

## 2019-07-24 ENCOUNTER — Other Ambulatory Visit: Payer: Self-pay | Admitting: Radiation Oncology

## 2019-07-24 DIAGNOSIS — C7931 Secondary malignant neoplasm of brain: Secondary | ICD-10-CM

## 2019-07-24 MED ORDER — DEXAMETHASONE 1 MG/ML PO CONC: ORAL | 1 refills | Status: DC

## 2019-07-24 NOTE — Progress Notes (Signed)
  Radiation Oncology         (336) [phone] ________________________________ Outpatient   Stereotactic Treatment Procedure Note   ICD-10-CM   1. Metastatic cancer to brain [facility])  C79.31     Name: Hailey Hill MRN: [id number]  Date: 07/22/2019  DOB: 01/08/1951  SPECIAL TREATMENT PROCEDURE  3D TREATMENT PLANNING AND DOSIMETRY:  The patient's radiation plan was reviewed and approved by neurosurgery and radiation oncology prior to treatment.  It showed 3-dimensional radiation distributions overlaid onto the planning CT/MRI image set.  [facility] for the target structures as well as the organs at risk were reviewed. The documentation of the 3D plan and dosimetry are filed in the radiation oncology EMR.  NARRATIVE:  Hailey Hill was brought to the TrueBeam stereotactic radiation treatment machine and placed supine on the CT couch. The head frame was applied, and the patient was set up for stereotactic radiosurgery.  Neurosurgery was present for the set-up and delivery  SIMULATION VERIFICATION:  In the couch zero-angle position, the patient underwent Exactrac imaging using the Brainlab system with orthogonal KV images.  These were carefully aligned and repeated to confirm treatment position for each of the isocenters.  The Exactrac snap film verification was repeated at each couch angle.  SPECIAL TREATMENT PROCEDURE: Hailey Hill received stereotactic radiosurgery to the following targets: PTV1 Rt Cerebellum 82mm and PTV2 Sup Cerebellum 60mm target was treated using 4 Rapid Arc VMAT Beams to a prescription dose of 9 Gy (3rd of three fractions, to total 27 Gy total). ExacTrac Snap verification was performed for each couch angle.   This constitutes a special treatment procedure due to the ablative dose delivered and the technical nature of treatment.  This highly technical modality of treatment ensures that the ablative dose is centered on the patient's tumor while sparing normal tissues from  excessive dose and risk of detrimental effects.  STEREOTACTIC TREATMENT MANAGEMENT:  Following delivery, the patient was transported to nursing in stable condition and monitored for possible acute effects.  Vital signs were recorded BP 106/69   Pulse 69   Temp 98.2 F (36.8 C) (Oral)   SpO2 100% Comment: room air. The patient tolerated treatment without significant acute effects, and was discharged to home in stable condition.    PLAN: Follow-up in 36mo.   Taper plan for Dexamethasone shared w/ [facility]; changed to suspension as patient reports intolerance of tablets: Take 63mL PO TID through 9-21. Then, take 49mL PO BID through 10-5. Then, take 63mL PO daily through 10-19. Then, take 2mL PO daily through 11-2. Then, take 8mL PO daily through 11-9. Then, stop. Take w/ food.  Patient reports that she is very dissatisfied with her stay at [facility].  She has difficulty envisioning a return to her home as she is concerned that her husband will not be able to take care of her.  We spoke about this for a while and I have consulted social work to follow-up with her.  She is going through very, very difficult time. ________________________________   Eppie Gibson, MD

## 2019-07-24 NOTE — Progress Notes (Signed)
  Radiation Oncology         (336) [phone] ________________________________ Outpatient   Stereotactic Treatment Procedure Note   ICD-10-CM   1. Metastatic cancer to brain [facility])  C79.31     Name: Hailey Hill MRN: [id number]  Date: 07/22/2019  DOB: [birth year]  SPECIAL TREATMENT PROCEDURE  3D TREATMENT PLANNING AND DOSIMETRY:  The patient's radiation plan was reviewed and approved by neurosurgery and radiation oncology prior to treatment.  It showed 3-dimensional radiation distributions overlaid onto the planning CT/MRI image set.  [facility] for the target structures as well as the organs at risk were reviewed. The documentation of the 3D plan and dosimetry are filed in the radiation oncology EMR.  NARRATIVE:  Hailey Hill was brought to the TrueBeam stereotactic radiation treatment machine and placed supine on the CT couch. The head frame was applied, and the patient was set up for stereotactic radiosurgery.  Neurosurgery was present for the set-up and delivery  SIMULATION VERIFICATION:  In the couch zero-angle position, the patient underwent Exactrac imaging using the Brainlab system with orthogonal KV images.  These were carefully aligned and repeated to confirm treatment position for each of the isocenters.  The Exactrac snap film verification was repeated at each couch angle.  SPECIAL TREATMENT PROCEDURE: [name] received stereotactic radiosurgery to the following targets: PTV1 Rt Cerebellum 16mm and PTV2 Sup Cerebellum 70mm target was treated using 4 Rapid Arc VMAT Beams to a prescription dose of 9 Gy (3rd of three fractions, to total 27 Gy total). ExacTrac Snap verification was performed for each couch angle.   This constitutes a special treatment procedure due to the ablative dose delivered and the technical nature of treatment.  This highly technical modality of treatment ensures that the ablative dose is centered on the patient's tumor while sparing normal tissues from  excessive dose and risk of detrimental effects.  STEREOTACTIC TREATMENT MANAGEMENT:  Following delivery, the patient was transported to nursing in stable condition and monitored for possible acute effects.  Vital signs were recorded BP 106/69   Pulse 69   Temp 98.2 F (36.8 C) (Oral)   SpO2 100% Comment: room air. The patient tolerated treatment without significant acute effects, and was discharged to home in stable condition.    PLAN: Follow-up in 70mo.   Taper plan for Dexamethasone shared w/ [place] [facility]; changed to suspension as patient reports intolerance of tablets: Take 85mL PO TID through 9-21. Then, take 72mL PO BID through 10-5. Then, take 64mL PO daily through 10-19. Then, take 92mL PO daily through 11-2. Then, take 58mL PO daily through 11-9. Then, stop. Take w/ food.  Patient reports that she is very dissatisfied with her stay at [facility].  She has difficulty envisioning a return to her home as she is concerned that her husband will not be able to take care of her.  We spoke about this for a while and I have consulted social work to follow-up with her.  She is going through a very, very difficult time. ________________________________   Eppie Gibson, MD

## 2019-07-25 ENCOUNTER — Telehealth: Payer: Self-pay

## 2019-07-25 ENCOUNTER — Ambulatory Visit: Payer: Medicare HMO | Admitting: Internal Medicine

## 2019-07-25 NOTE — Telephone Encounter (Signed)
I called [facility] today and spoke to [name]'s nurse. She verified that she received the new orders for [name]'s dexamethasone taper that I faxed yesterday. [name] is very pleased with the change from pills to liquid form per her nurse. They know to call me if they have any questions or concerns regarding the medicine.

## 2019-07-26 ENCOUNTER — Inpatient Hospital Stay: Payer: Medicare HMO | Attending: Internal Medicine | Admitting: Internal Medicine

## 2019-07-26 ENCOUNTER — Telehealth: Payer: Self-pay | Admitting: *Deleted

## 2019-07-26 ENCOUNTER — Other Ambulatory Visit: Payer: Self-pay

## 2019-07-26 VITALS — BP 115/68 | HR 74 | Temp 98.2°F | Resp 17 | Ht 68.0 in | Wt 158.7 lb

## 2019-07-26 DIAGNOSIS — E785 Hyperlipidemia, unspecified: Secondary | ICD-10-CM | POA: Diagnosis not present

## 2019-07-26 DIAGNOSIS — C3490 Malignant neoplasm of unspecified part of unspecified bronchus or lung: Secondary | ICD-10-CM | POA: Insufficient documentation

## 2019-07-26 DIAGNOSIS — Z87891 Personal history of nicotine dependence: Secondary | ICD-10-CM | POA: Diagnosis not present

## 2019-07-26 DIAGNOSIS — C7931 Secondary malignant neoplasm of brain: Secondary | ICD-10-CM | POA: Diagnosis not present

## 2019-07-26 DIAGNOSIS — Z79899 Other long term (current) drug therapy: Secondary | ICD-10-CM | POA: Diagnosis not present

## 2019-07-26 DIAGNOSIS — Z7901 Long term (current) use of anticoagulants: Secondary | ICD-10-CM | POA: Diagnosis not present

## 2019-07-26 NOTE — Telephone Encounter (Signed)
[facility] Clinical Social Work  Clinical Social Work was referred by radiation oncologist for assessment of psychosocial needs.  Clinical Social Worker contacted patient by phone to offer support and assess for needs.    Mrs. Hailey Hill reported she met with her neuro-oncologist earlier today and he "gave her hope".  She shared he gained her trust immediately after meeting with her by sharing she was a "walking miracle" and inquiring about her goals and hopes.  She shared she had been feeling discouraged due to recent medical experience and residing at [facility] for rehabilitation.  Patient is eager to return home and be independent.  She shared she is an avid walker, but is very fearful she will fall again.  CSW and patient explored goals for rehab to build her strength, educate her on safe practices on independence in her home, and acclimating to her "new normal".  CSW explored sources of loss including physical independence, isolation from family and friends, and recurrence of cancer. Patient was tearful and shared how much she missed walking and seeing her grandchildren.  CSW provided active listening and validated her feelings of sadness and loneliness during this time.  Mrs. Hailey Hill shared her main source of strength is her faith in God and she "gives it all to him" [in reference to her worries and health].  CSW encouraged patient to utilize counseling services.  Patient is not interested, stated her faith serves as her emotional support.  CSW expressed understanding, but encouraged patient to call if she'd like to talk further or has any questions.    , LCSW  Clinical Social Worker [facility]         

## 2019-07-26 NOTE — Progress Notes (Signed)
[facility] at [place], [place] [address] [phone]   New Patient Evaluation  Date of Service: 07/26/19 Patient Name: Hailey Hill Patient MRN: [id number] Patient DOB: December 18, 1950 Provider: Ventura Sellers, MD  Identifying Statement:  Hailey Hill is a 68 y.o. female with Metastatic cancer to brain [facility]) [C79.31] who presents for initial consultation and evaluation regarding cancer associated neurologic deficits.    Referring Provider: [name], NP [place],  [place] [address]  Primary Cancer: Small Cell Lung  CNS Oncologic History: 2012: [place] PCI 07/22/19: Completes 3 frx SRS to cerebllar metastases with Isidore Moos  History of Present Illness: The patient's records from the referring physician were obtained and reviewed and the patient interviewed to confirm this HPI.  Hailey Hill presents today to review CNS tumor burden and neurologic deficits.  She describes several weeks ago experiencing multiple falls due to feeling off balanced and vertiginous.  Although these symptoms were described as new, she does have several months history of difficulty walking at night without the lights on.  During one fall, she broke her left arm, requiring hospitalization.  CNS imaging demonstrated 2 enhancing cerebellar metastases.  Systemic imaging had been stable in July, as she has been on observation for several years; last brain MRI on record was in 2012 following PCI.  At present she is at a rehab facility.  Main deficit is difficulty walking, and though she is able to walk some on her own, she doesn't feel safe and is requiring assistance.  Continues on decadron taper currently 4mg  TID.    Medications: Current Outpatient Medications on File Prior to Visit  Medication Sig Dispense Refill   ALPRAZolam (XANAX) 0.5 MG tablet Take 1 tablet (0.5 mg total) by mouth as needed for anxiety (for MRI). 15 tablet 0    dexamethasone (DECADRON) 1 MG/ML solution Take 72mL PO TID through 9-21. Then, take 79mL PO BID through 10-5. Then, take 66mL PO daily through 10-19. Then, take 31mL PO daily through 11-2. Then, take 50mL PO daily through 11-9. Then, stop. Take w/ food. 100 mL 1   enoxaparin (LOVENOX) 40 MG/0.4ML injection Inject 0.4 mLs (40 mg total) into the skin at bedtime. 0 mL    senna (SENOKOT) 8.6 MG TABS tablet Take 1 tablet (8.6 mg total) by mouth 2 (two) times daily. (Patient not taking: Reported on 07/26/2019) 120 tablet 0   traMADol (ULTRAM) 50 MG tablet Take 1 tablet (50 mg total) by mouth every 6 (six) hours as needed for moderate pain. (Patient not taking: Reported on 07/26/2019) 20 tablet 0   No current facility-administered medications on file prior to visit.     Allergies:  Allergies  Allergen Reactions   Promethazine Hcl Other (See Comments)    Pt reports feeling shaky and "sick" after phenergan   Past Medical History:  Past Medical History:  Diagnosis Date   Complication of anesthesia    had complications with "gas buildup" after tubal lig   Dyslipidemia    Lung cancer ([facility]) dx'd 07/2010   sm cell    Peripheral neuropathy    SVC syndrome    Thrombus 29/47/6546   Left basilic vein,  Left subclavian vein   Past Surgical History:  Past Surgical History:  Procedure Laterality Date   CERVICAL BIOPSY     CHOLECYSTECTOMY N/A 07/25/2016   Procedure: LAPAROSCOPIC CHOLECYSTECTOMY;  Surgeon: Coralie Keens, MD;  Location: [facility];  Service: General;  Laterality: N/A;   TONSILLECTOMY     TUBAL LIGATION     Social History:  Social History   Socioeconomic History   Marital status: Married    Spouse name: Not on file   Number of children: Not on file   Years of education: Not on file   Highest education level: Not on file  Occupational History   Not on file  Social Needs   Financial resource strain: Not on file   Food insecurity    Worry: Not on  file    Inability: Not on file   Transportation needs    Medical: Not on file    Non-medical: Not on file  Tobacco Use   Smoking status: Former Smoker    Quit date: 03/08/2010    Years since quitting: 9.3   Smokeless tobacco: Never Used  Substance and Sexual Activity   Alcohol use: No   Drug use: No   Sexual activity: Never    Birth control/protection: Surgical  Lifestyle   Physical activity    Days per week: Not on file    Minutes per session: Not on file   Stress: Not on file  Relationships   Social connections    Talks on phone: Not on file    Gets together: Not on file    Attends religious service: Not on file    Active member of club or organization: Not on file    Attends meetings of clubs or organizations: Not on file    Relationship status: Not on file   Intimate partner violence    Fear of current or ex partner: Not on file    Emotionally abused: Not on file    Physically abused: Not on file    Forced sexual activity: Not on file  Other Topics Concern   Not on file  Social History Narrative   Not on file   Family History:  Family History  Problem Relation Age of Onset   Nephrolithiasis Son    Heart attack Father    Heart attack Paternal Uncle    Heart attack Paternal Grandfather     Review of Systems: Constitutional: Denies fevers, chills or abnormal weight loss Eyes: Denies blurriness of vision Ears, nose, mouth, throat, and face: Denies mucositis or sore throat Respiratory: Denies cough, dyspnea or wheezes Cardiovascular: Denies palpitation, chest discomfort or lower extremity swelling Gastrointestinal:  Denies nausea, constipation, diarrhea GU: Denies dysuria or incontinence Skin: Denies abnormal skin rashes Neurological: Per HPI Musculoskeletal: Denies joint pain, back or neck discomfort. No decrease in ROM Behavioral/Psych: Denies anxiety, disturbance in thought content, and mood instability   Physical Exam: Vitals:   07/26/19  1003  BP: 115/68  Pulse: 74  Resp: 17  Temp: 98.2 F (36.8 C)  SpO2: 100%   KPS: 70. General: Alert, cooperative, pleasant, in no acute distress Head: Normal EENT: No conjunctival injection or scleral icterus. Oral mucosa moist Lungs: Resp effort normal Cardiac: Regular rate and rhythm Abdomen: Soft, non-distended abdomen Skin: No rashes cyanosis or petechiae. Extremities: No clubbing or edema  Neurologic Exam: Mental Status: Awake, alert, attentive to examiner. Oriented to self and environment. Language is fluent with intact comprehension.  Cranial Nerves: Visual acuity is grossly normal. Visual fields are full. Extra-ocular movements intact. No ptosis. Face is symmetric, tongue midline. Motor: Tone and bulk are normal. Power is full in both arms and legs. Reflexes are symmetric, no pathologic reflexes present. Intact finger to nose bilaterally Sensory: Decreased to  ankles in stocking pattern Gait: Wide based cerebellar and sensory dystaxia, independent for short distances only  Labs: I have reviewed the data as listed    Component Value Date/Time   NA 139 07/11/2019 0555   NA 142 04/25/2017 0805   K 4.4 07/11/2019 0555   K 4.5 04/25/2017 0805   CL 106 07/11/2019 0555   CL 108 (H) 01/09/2013 0913   CO2 22 07/11/2019 0555   CO2 26 04/25/2017 0805   GLUCOSE 110 (H) 07/11/2019 0555   GLUCOSE 95 04/25/2017 0805   GLUCOSE 101 (H) 01/09/2013 0913   BUN 23 07/11/2019 0555   BUN 12.8 04/25/2017 0805   CREATININE 0.79 07/11/2019 0555   CREATININE 0.90 05/08/2019 0739   CREATININE 0.9 04/25/2017 0805   CALCIUM 9.1 07/11/2019 0555   CALCIUM 9.6 04/25/2017 0805   PROT 6.7 05/08/2019 0739   PROT 6.6 04/25/2017 0805   ALBUMIN 3.9 05/08/2019 0739   ALBUMIN 3.8 04/25/2017 0805   AST 13 (L) 05/08/2019 0739   AST 13 04/25/2017 0805   ALT 12 05/08/2019 0739   ALT 15 04/25/2017 0805   ALKPHOS 94 05/08/2019 0739   ALKPHOS 79 04/25/2017 0805   BILITOT 0.3 05/08/2019 0739    BILITOT 0.47 04/25/2017 0805   GFRNONAA >60 07/11/2019 0555   GFRNONAA >60 05/08/2019 0739   GFRAA >60 07/11/2019 0555   GFRAA >60 05/08/2019 0739   Lab Results  Component Value Date   WBC 11.2 (H) 07/07/2019   NEUTROABS 9.3 (H) 07/07/2019   HGB 12.8 07/07/2019   HCT 37.6 07/07/2019   MCV 90.8 07/07/2019   PLT 244 07/07/2019    Imaging:  Dg Ribs Unilateral W/chest Right  Result Date: 07/07/2019 CLINICAL DATA:  Pain after fall EXAM: RIGHT RIBS AND CHEST - 3+ VIEW COMPARISON:  None. FINDINGS: The cardiomediastinal silhouette is stable. Post radiation changes in the left perihilar region are stable. No pneumothorax. No nodules or masses. Comminuted fracture of the proximal humerus described on dedicated images. No obvious right rib fractures are identified. IMPRESSION: No rib fractures noted. Electronically Signed   By: Dorise Bullion III M.D   On: 07/07/2019 14:48   Dg Lumbar Spine Complete  Result Date: 07/07/2019 CLINICAL DATA:  Pain after fall EXAM: LUMBAR SPINE - COMPLETE 4+ VIEW COMPARISON:  None. FINDINGS: No malalignment. No fractures. Degenerative changes are seen at L4-5. Atherosclerotic changes are seen in the abdominal aorta. IMPRESSION: No acute abnormalities. Electronically Signed   By: Dorise Bullion III M.D   On: 07/07/2019 14:49   Mr Brain W Wo Contrast  Result Date: 07/10/2019 CLINICAL DATA:  Small cell lung cancer.  Known brain metastases. EXAM: MRI HEAD WITHOUT AND WITH CONTRAST TECHNIQUE: Multiplanar, multiecho pulse sequences of the brain and surrounding structures were obtained without and with intravenous contrast. CONTRAST:  7 mL Gadavist COMPARISON:  1.5 tesla MRI head without and with contrast 07/07/2019. MR head without and with contrast 11/26/2010 and 07/13/2010 FINDINGS: Brain: A peripherally enhancing mass lesion fall vein the inferior cerebellar vermis and medial right cerebellar hemisphere measures 3.3 x 3.4 x 2.5 cm. A second lesion is present in the  superior cerebellar vermis with a similar heterogeneous enhancement pattern, but less prominent central necrosis. The second lesion measures 2.2 x 2.2 x 1.4 cm No supratentorial foci of enhancement are present. There is mild prominence of the lateral ventricles and third ventricles. This is likely secondary to mass effect from the lesion in the superior vermis on the aqueduct of Sylvius.  Mild periventricular white matter changes are present. Extensive T2 signal changes surround the cerebellar lesions. Fourth ventricle is of normal size. Blood products or calcification are noted within both enhancing lesions. No other significant hemorrhage is present. The internal auditory canals are within normal limits. Mild white matter changes are noted in the brainstem. Vascular: Flow is present in the major intracranial arteries. Skull and upper cervical spine: The craniocervical junction is normal. Upper cervical spine is within normal limits. Marrow signal is unremarkable. Sinuses/Orbits: Small polyps or mucous retention cysts are noted along the inferior maxillary sinuses bilaterally. There is minimal fluid in the left mastoid air cells. No obstructing nasopharyngeal lesion is present. The paranasal sinuses and mastoid air cells are otherwise clear. The globes and orbits are within normal limits. IMPRESSION: 1. Heterogeneous enhancing lesions in the cerebellum as described consistent with metastatic disease. 2. 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 3. Moderate surrounding vasogenic edema in the cerebellum. Cerebellar lesions and edema likely correlate with the patient's recent falls. 4. No supratentorial metastases. Electronically Signed   By: San Morelle M.D.   On: 07/10/2019 06:00   Mr Jeri Cos JJ Contrast  Result Date: 07/07/2019 CLINICAL DATA:  Dizziness.  History of small cell lung cancer. EXAM: MRI HEAD WITHOUT AND WITH CONTRAST TECHNIQUE:  Multiplanar, multiecho pulse sequences of the brain and surrounding structures were obtained without and with intravenous contrast. CONTRAST:  Gadavist 7.5 mL. COMPARISON:  MRI brain 11/26/2010. FINDINGS: Brain: Two intracranial mass lesions are present with abnormal enhancement and central necrosis. The smaller lesion is in the superior vermis, approximately 1.5 x 2 x 2 cm. The larger lesion is in the midline to slight inferior vermis extending into the RIGHT cerebellar hemisphere, approximately 2.5 x 3 x 3 cm. There is moderate surrounding edema. And effacement of the fourth ventricle. Elsewhere there is atrophy with prominence of the cisterns and sulci. Ventriculomegaly is favored to represent central atrophy rather than obstructive hydrocephalus. Vascular: Normal flow voids. Skull and upper cervical spine: Normal marrow signal. Sinuses/Orbits: Chronic sphenoid sinusitis.  Negative orbits. Other: Unremarkable. 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 There is moderate surrounding edema and effacement the fourth ventricle. Untreated, the patient is at risk for obstructive hydrocephalus. Neurosurgical consultation may be warranted. These results were called by telephone at the time of interpretation on 07/07/2019 at 5:44 pm to Dr. Eulis Foster who verbally acknowledged these results. Electronically Signed   By: Staci Righter M.D.   On: 07/07/2019 17:45   Dg Shoulder Left  Result Date: 07/07/2019 CLINICAL DATA:  Pain after fall. EXAM: LEFT SHOULDER - 2+ VIEW COMPARISON:  None. FINDINGS: There is a comminuted fracture through the humeral head and neck without significant displacement. No dislocation identified. Clavicle and scapular grossly intact. Limited views of the left chest are normal. IMPRESSION: Comminuted fracture of the humeral head and neck without significant displacement or dislocation. Electronically Signed   By: Dorise Bullion III M.D   On:  07/07/2019 14:45   Dg Humerus Left  Result Date: 07/07/2019 CLINICAL DATA:  Pain after fall EXAM: LEFT HUMERUS - 2+ VIEW COMPARISON:  None. FINDINGS: Again noted is a nondisplaced comminuted fracture of the left humeral head and neck. The remainder of the humerus is intact. IMPRESSION: Nondisplaced comminuted fracture of the left humeral head and neck. The remainder of the humerus is intact. Electronically Signed   By: Dorise Bullion III M.D  On: 07/07/2019 14:46    Assessment/Plan Metastatic cancer to brain [facility]) [C79.31]  Ms. 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  Do not suspect epileptic etiology at this time for reported falls and questionable LOC.   She has improved since onset of steroids and radiotherapy, but still has significant dystaxia requiring rehab.  Recommend continuing planned gradual decadron taper given eloquence of target lesions and radiation volume/dose.  Should continue aggressive physical therapy once outapatient.  Has some support at home.    We spent twenty additional minutes teaching regarding the natural history, biology, and historical experience in the treatment of neurologic complications of cancer. We also provided teaching sheets for the patient to take home as an additional resource.  We appreciate the opportunity to participate in the care of Hailey Hill.  She should return in 3 months after post SRS brain MRI for evaluation or sooner if needed.  All questions were answered. The patient knows to call the clinic with any problems, questions or concerns. No barriers to learning were detected.  The total time spent in the encounter was 40 minutes and more than 50% was on counseling and review of test results   Ventura Sellers, MD Medical Director of Neuro-Oncology [facility] [place] 07/26/19 1:31 PM

## 2019-07-29 ENCOUNTER — Telehealth: Payer: Self-pay | Admitting: Internal Medicine

## 2019-07-29 NOTE — Telephone Encounter (Signed)
Scheduled appt per 9/18 los.  Could not leave a voice message on either phone.  Print and mailed appt calendar

## 2019-07-30 ENCOUNTER — Inpatient Hospital Stay (HOSPITAL_BASED_OUTPATIENT_CLINIC_OR_DEPARTMENT_OTHER): Payer: Medicare HMO | Admitting: Internal Medicine

## 2019-07-30 ENCOUNTER — Encounter: Payer: Self-pay | Admitting: Internal Medicine

## 2019-07-30 ENCOUNTER — Ambulatory Visit: Payer: Medicare HMO | Admitting: Internal Medicine

## 2019-07-30 ENCOUNTER — Other Ambulatory Visit: Payer: Self-pay

## 2019-07-30 VITALS — BP 94/60 | HR 73 | Temp 98.3°F | Resp 17 | Ht 68.0 in | Wt 159.2 lb

## 2019-07-30 DIAGNOSIS — C3490 Malignant neoplasm of unspecified part of unspecified bronchus or lung: Secondary | ICD-10-CM | POA: Diagnosis not present

## 2019-07-30 DIAGNOSIS — C7931 Secondary malignant neoplasm of brain: Secondary | ICD-10-CM

## 2019-07-30 DIAGNOSIS — S42292D Other displaced fracture of upper end of left humerus, subsequent encounter for fracture with routine healing: Secondary | ICD-10-CM

## 2019-07-30 NOTE — Progress Notes (Signed)
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 She received radiation treatments to the chest from 10/04/2010 through 11/19/2010. 2) prophylactic whole- brain radiation 2500 cGy in 10 fractions from 12/07/2010 through 12/20/2010. 3) status post stereotactic radiotherapy to the metastatic brain lesion under the care of Dr. Isidore Moos in September 2020  CURRENT THERAPY: Observation.  INTERVAL HISTORY: Hailey GALDAMEZ 68 y.o. female returns to the clinic today for follow-up visit.  The patient is feeling fine today with no concerning complaints except for the broken left humerus after a fall.  The patient was on observation for more than 9 years and she was recently discharged to follow-up with her primary care physician.  She has dizzy spells and vertigo in August 2020.  MRI of the brain on 07/07/2019 showed 2 intracranial metastatic lesions involving the cerebellar vermis with moderate surrounding edema and effacement of the fourth ventricle.  The patient was seen by Dr. Isidore Moos and started on a tapered dose of Decadron.  She also received stereotactic radiotherapy to the metastatic brain lesions.  She has a fall and broke her left humerus in August 2020.  She presented today for reevaluation and resuming her follow-up care for the small cell lung cancer.  She denied having any current chest pain, shortness of  breath, cough or hemoptysis.  She denied having any fever or chills.  She has no nausea, vomiting, diarrhea or constipation.  She is currently on a skilled nursing facility for rehabilitation.  MEDICAL HISTORY: Past Medical History:  Diagnosis Date   Complication of anesthesia    had complications with "gas buildup" after tubal lig   Dyslipidemia    Lung cancer ([place]) dx'd 07/2010   sm cell    Peripheral neuropathy    SVC syndrome    Thrombus 62/22/9798   Left basilic vein,  Left subclavian vein    ALLERGIES:  is allergic to promethazine hcl.  MEDICATIONS:  Current Outpatient Medications  Medication Sig Dispense Refill   ALPRAZolam (XANAX) 0.5 MG tablet Take 1 tablet (0.5 mg total) by mouth as needed for anxiety (for MRI). 15 tablet 0   dexamethasone (DECADRON) 1 MG/ML solution Take 18mL PO TID through 9-21. Then, take 69mL PO BID through 10-5. Then, take 29mL PO daily through 10-19. Then, take 58mL PO daily through 11-2. Then, take 1mL PO daily through 11-9. Then, stop. Take w/ food. 100 mL 1   enoxaparin (LOVENOX) 40 MG/0.4ML injection Inject 0.4 mLs (40 mg total) into the skin at bedtime. 0 mL    meclizine (ANTIVERT) 25 MG tablet meclizine 25 mg tablet     ondansetron (ZOFRAN-ODT) 8 MG disintegrating tablet DISSOLVE 1 TABLET EVERY 8 HOURS AS NEEDED FOR 10 DAYS     senna (SENOKOT) 8.6 MG TABS tablet Take 1 tablet (8.6 mg total) by mouth 2 (two) times daily. 120 tablet 0   traMADol (ULTRAM) 50 MG tablet Take 1 tablet (50 mg total)  by mouth every 6 (six) hours as needed for moderate pain. 20 tablet 0   No current facility-administered medications for this visit.     REVIEW OF SYSTEMS:  Constitutional: positive for fatigue Eyes: negative Ears, nose, mouth, throat, and face: negative Respiratory: negative Cardiovascular: negative Gastrointestinal: negative Genitourinary:negative Integument/breast: negative Hematologic/lymphatic: negative Musculoskeletal:positive for bone  pain Neurological: negative Behavioral/Psych: negative Endocrine: negative Allergic/Immunologic: negative   PHYSICAL EXAMINATION: General appearance: alert, cooperative, fatigued and no distress Head: Normocephalic, without obvious abnormality, atraumatic Neck: no adenopathy, no JVD, supple, symmetrical, trachea midline and thyroid not enlarged, symmetric, no tenderness/mass/nodules Lymph nodes: Cervical, supraclavicular, and axillary nodes normal. Resp: clear to auscultation bilaterally Back: symmetric, no curvature. ROM normal. No CVA tenderness. Cardio: regular rate and rhythm, S1, S2 normal, no murmur, click, rub or gallop GI: soft, non-tender; bowel sounds normal; no masses,  no organomegaly Extremities: extremities normal, atraumatic, no cyanosis or edema Neurologic: Alert and oriented X 3, normal strength and tone. Normal symmetric reflexes. Normal coordination and gait  ECOG PERFORMANCE STATUS: 1 - Symptomatic but completely ambulatory  Blood pressure 94/60, pulse 73, temperature 98.3 F (36.8 C), temperature source Temporal, resp. rate 17, height 5\' 8"  (1.727 m), weight 159 lb 3.2 oz (72.2 kg), SpO2 99 %.  LABORATORY DATA: Lab Results  Component Value Date   WBC 11.2 (H) 07/07/2019   HGB 12.8 07/07/2019   HCT 37.6 07/07/2019   MCV 90.8 07/07/2019   PLT 244 07/07/2019      Chemistry      Component Value Date/Time   NA 139 07/11/2019 0555   NA 142 04/25/2017 0805   K 4.4 07/11/2019 0555   K 4.5 04/25/2017 0805   CL 106 07/11/2019 0555   CL 108 (H) 01/09/2013 0913   CO2 22 07/11/2019 0555   CO2 26 04/25/2017 0805   BUN 23 07/11/2019 0555   BUN 12.8 04/25/2017 0805   CREATININE 0.79 07/11/2019 0555   CREATININE 0.90 05/08/2019 0739   CREATININE 0.9 04/25/2017 0805      Component Value Date/Time   CALCIUM 9.1 07/11/2019 0555   CALCIUM 9.6 04/25/2017 0805   ALKPHOS 94 05/08/2019 0739   ALKPHOS 79 04/25/2017 0805   AST 13 (L) 05/08/2019 0739   AST 13  04/25/2017 0805   ALT 12 05/08/2019 0739   ALT 15 04/25/2017 0805   BILITOT 0.3 05/08/2019 0739   BILITOT 0.47 04/25/2017 0805       RADIOGRAPHIC STUDIES: Dg Ribs Unilateral W/chest Right  Result Date: 07/07/2019 CLINICAL DATA:  Pain after fall EXAM: RIGHT RIBS AND CHEST - 3+ VIEW COMPARISON:  None. FINDINGS: The cardiomediastinal silhouette is stable. Post radiation changes in the left perihilar region are stable. No pneumothorax. No nodules or masses. Comminuted fracture of the proximal humerus described on dedicated images. No obvious right rib fractures are identified. IMPRESSION: No rib fractures noted. Electronically Signed   By: Dorise Bullion III M.D   On: 07/07/2019 14:48   Dg Lumbar Spine Complete  Result Date: 07/07/2019 CLINICAL DATA:  Pain after fall EXAM: LUMBAR SPINE - COMPLETE 4+ VIEW COMPARISON:  None. FINDINGS: No malalignment. No fractures. Degenerative changes are seen at L4-5. Atherosclerotic changes are seen in the abdominal aorta. IMPRESSION: No acute abnormalities. Electronically Signed   By: Dorise Bullion III M.D   On: 07/07/2019 14:49   Mr Brain W Wo Contrast  Result Date: 07/10/2019 CLINICAL DATA:  Small cell lung cancer.  Known brain metastases. EXAM: MRI HEAD WITHOUT AND WITH CONTRAST  TECHNIQUE: Multiplanar, multiecho pulse sequences of the brain and surrounding structures were obtained without and with intravenous contrast. CONTRAST:  7 mL Gadavist COMPARISON:  1.5 tesla MRI head without and with contrast 07/07/2019. MR head without and with contrast 11/26/2010 and 07/13/2010 FINDINGS: Brain: A peripherally enhancing mass lesion fall vein the inferior cerebellar vermis and medial right cerebellar hemisphere measures 3.3 x 3.4 x 2.5 cm. A second lesion is present in the superior cerebellar vermis with a similar heterogeneous enhancement pattern, but less prominent central necrosis. The second lesion measures 2.2 x 2.2 x 1.4 cm No supratentorial foci of enhancement  are present. There is mild prominence of the lateral ventricles and third ventricles. This is likely secondary to mass effect from the lesion in the superior vermis on the aqueduct of Sylvius. Mild periventricular white matter changes are present. Extensive T2 signal changes surround the cerebellar lesions. Fourth ventricle is of normal size. Blood products or calcification are noted within both enhancing lesions. No other significant hemorrhage is present. The internal auditory canals are within normal limits. Mild white matter changes are noted in the brainstem. Vascular: Flow is present in the major intracranial arteries. Skull and upper cervical spine: The craniocervical junction is normal. Upper cervical spine is within normal limits. Marrow signal is unremarkable. Sinuses/Orbits: Small polyps or mucous retention cysts are noted along the inferior maxillary sinuses bilaterally. There is minimal fluid in the left mastoid air cells. No obstructing nasopharyngeal lesion is present. The paranasal sinuses and mastoid air cells are otherwise clear. The globes and orbits are within normal limits. IMPRESSION: 1. Heterogeneous enhancing lesions in the cerebellum as described consistent with metastatic disease. 2. 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 3. Moderate surrounding vasogenic edema in the cerebellum. Cerebellar lesions and edema likely correlate with the patient's recent falls. 4. No supratentorial metastases. Electronically Signed   By: San Morelle M.D.   On: 07/10/2019 06:00   Mr Jeri Cos OJ Contrast  Result Date: 07/07/2019 CLINICAL DATA:  Dizziness.  History of small cell lung cancer. EXAM: MRI HEAD WITHOUT AND WITH CONTRAST TECHNIQUE: Multiplanar, multiecho pulse sequences of the brain and surrounding structures were obtained without and with intravenous contrast. CONTRAST:  Gadavist 7.5 mL. COMPARISON:  MRI brain 11/26/2010.  FINDINGS: Brain: Two intracranial mass lesions are present with abnormal enhancement and central necrosis. The smaller lesion is in the superior vermis, approximately 1.5 x 2 x 2 cm. The larger lesion is in the midline to slight inferior vermis extending into the RIGHT cerebellar hemisphere, approximately 2.5 x 3 x 3 cm. There is moderate surrounding edema. And effacement of the fourth ventricle. Elsewhere there is atrophy with prominence of the cisterns and sulci. Ventriculomegaly is favored to represent central atrophy rather than obstructive hydrocephalus. Vascular: Normal flow voids. Skull and upper cervical spine: Normal marrow signal. Sinuses/Orbits: Chronic sphenoid sinusitis.  Negative orbits. Other: Unremarkable. 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 There is moderate surrounding edema and effacement the fourth ventricle. Untreated, the patient is at risk for obstructive hydrocephalus. Neurosurgical consultation may be warranted. These results were called by telephone at the time of interpretation on 07/07/2019 at 5:44 pm to Dr. Eulis Foster who verbally acknowledged these results. Electronically Signed   By: Staci Righter M.D.   On: 07/07/2019 17:45   Dg Shoulder Left  Result Date: 07/07/2019 CLINICAL DATA:  Pain after fall. EXAM: LEFT SHOULDER - 2+ VIEW COMPARISON:  None. FINDINGS: There is a comminuted fracture through the humeral head and neck without significant displacement. No dislocation identified. Clavicle and scapular grossly intact. Limited views of the left chest are normal. IMPRESSION: Comminuted fracture of the humeral head and neck without significant displacement or dislocation. Electronically Signed   By: Dorise Bullion III M.D   On: 07/07/2019 14:45   Dg Humerus Left  Result Date: 07/07/2019 CLINICAL DATA:  Pain after fall EXAM: LEFT HUMERUS - 2+ VIEW COMPARISON:  None. FINDINGS: Again noted is a nondisplaced comminuted  fracture of the left humeral head and neck. The remainder of the humerus is intact. IMPRESSION: Nondisplaced comminuted fracture of the left humeral head and neck. The remainder of the humerus is intact. Electronically Signed   By: Dorise Bullion III M.D   On: 07/07/2019 14:46    ASSESSMENT AND PLAN:  This is a very pleasant 68 years old white female with metastatic small cell lung cancer with recent brain metastasis diagnosed in August 2020.  She was initially diagnosed with limited stage small cell lung cancer diagnosed in September 2011 status post systemic chemotherapy with cisplatin and etoposide for 6 cycles concurrent with radiation and followed by prophylactic cranial irradiation.  She has been on observation for more than 9 years with no evidence of systemic metastasis except for the recent brain metastasis. The patient received stereotactic radiotherapy to the metastatic brain lesions in September 2020. I recommended for the patient to continue with the taper dose of Decadron. I will arrange for the patient to have repeat CT scan of the chest, abdomen pelvis in 3 months for restaging of her disease. She was also advised to call immediately if she has any concerning symptoms in the interval. The patient voices understanding of current disease status and treatment options and is in agreement with the current care plan. All questions were answered. The patient knows to call the clinic with any problems, questions or concerns. We can certainly see the patient much sooner if necessary.  Disclaimer: This note was dictated with voice recognition software. Similar sounding words can inadvertently be transcribed and may not be corrected upon review.

## 2019-07-31 ENCOUNTER — Telehealth: Payer: Self-pay | Admitting: Internal Medicine

## 2019-07-31 NOTE — Telephone Encounter (Signed)
Scheduled appt per 9/22 los - mailed reminder letter with appt date and time

## 2019-08-01 ENCOUNTER — Ambulatory Visit: Payer: Medicare HMO | Admitting: Internal Medicine

## 2019-08-06 ENCOUNTER — Telehealth: Payer: Self-pay | Admitting: Internal Medicine

## 2019-08-06 NOTE — Telephone Encounter (Signed)
Returned call to patient re rescheduling 12/15 f/u. Gave patient new date for f/u with Dr. Mickeal Skinner 12/14. Other appointments remain the same.

## 2019-08-21 ENCOUNTER — Encounter: Payer: Self-pay | Admitting: Radiation Oncology

## 2019-08-21 NOTE — Progress Notes (Signed)
Hailey Hill presents for follow up of [place] radiation completed 07/16/19, 07/19/19, and 07/22/19 to her Cerebellum targets. She denies pain. She is eating and drinking well. She has completed her decadron taper.  She denies headaches, nausea, or other neurological problems. She is walking 30 minutes daily inside. She is feeling well.   BP 120/74 (BP Location: Right Arm, Patient Position: Sitting)   Pulse 75   Temp 98.2 F (36.8 C) (Temporal)   Resp 18   Ht 5\' 8"  (1.727 m)   Wt 159 lb 8 oz (72.3 kg)   SpO2 100%   BMI 24.25 kg/m    Wt Readings from Last 3 Encounters:  08/23/19 159 lb 8 oz (72.3 kg)  07/30/19 159 lb 3.2 oz (72.2 kg)  07/26/19 158 lb 11.2 oz (72 kg)

## 2019-08-22 NOTE — Progress Notes (Signed)
  Patient Name: Hailey Hill MRN: [id number] DOB: February 01, 1951 Referring Physician: [name] (Profile Not Attached) Date of Service: 07/22/2019 [facility], [place]                                                        End Of Treatment Note  Diagnoses: C79.31-Secondary malignant neoplasm of brain  Cancer Staging: Metastatic Small Cell Lung Cancer  Intent: Palliative  Radiation Treatment Dates: 07/16/2019 through 07/22/2019 Site Technique Total Dose Dose per Fx Completed Fx Beam Energies  Brain: Brain IMRT 27/27 9 3/3 6XFFF   Narrative: The patient tolerated radiation therapy relatively well without any acute side effects.  Plan: The patient will follow-up with radiation oncology in 1 month.  ________________________________________________   Eppie Gibson, MD  This document serves as a record of services personally performed by Eppie Gibson, MD. It was created on her behalf by Wilburn Mylar, a trained medical scribe. The creation of this record is based on the scribe's personal observations and the provider's statements to them. This document has been checked and approved by the attending provider.

## 2019-08-22 NOTE — Progress Notes (Signed)
Radiation Oncology         [phone] ________________________________  Name: Hailey Hill MRN: [id number]  Date: 08/23/2019  DOB: 22-Mar-1951  Follow-Up Visit Note  Outpatient  CC: Hailey Beals, NP  Hailey Beals, NP  Diagnosis:   Cancer Staging Small cell lung cancer [facility]) Staging form: Lung, AJCC 7th Edition - Clinical: Limited stage small cell lung cancer - Signed by Curt Bears, MD on 04/24/2014   CHIEF COMPLAINT: Here for follow-up and surveillance of metastatic lung cancer  Interval Since Last Radiation:  1 month   07/16/2019 through 07/22/2019 Site Technique Total Dose Dose per Fx Completed Fx Beam Energies  Brain: Brain IMRT 27/27 9 3/3 6XFFF   Narrative:  The patient returns today for routine follow-up.          Most recent MRI of the brain was prior to radiation treatment on 07/09/2019.  She denies pain. She is eating and drinking well. She has not taken Decadron for 2 days - had been taking 4mg  daily.  She denies headaches, nausea, or other neurological problems. She is walking 30 minutes daily inside and doing squats. She is feeling well.    Status of systemic system per chest CT scan on 05/08/2019 demonstrates no evidence of recurrent or metastatic disease in the chest. She is scheduled for chest/abdomen/pelvis CT on 10/29/2019. She will see med/onc after that.    ALLERGIES:  is allergic to promethazine hcl.  Meds: Current Outpatient Medications  Medication Sig Dispense Refill  . acetaminophen (TYLENOL) 500 MG tablet Take 500 mg by mouth every 6 (six) hours as needed.    . ondansetron (ZOFRAN-ODT) 8 MG disintegrating tablet DISSOLVE 1 TABLET EVERY 8 HOURS AS NEEDED FOR 10 DAYS    . ALPRAZolam (XANAX) 0.5 MG tablet Take 1 tablet (0.5 mg total) by mouth as needed for anxiety (for MRI). (Patient not taking: Reported on 08/23/2019) 15 tablet 0  . dexamethasone (DECADRON) 1 MG/ML solution Take 43mL PO TID through 9-21. Then, take 47mL PO BID through 10-5.  Then, take 45mL PO daily through 10-19. Then, take 73mL PO daily through 11-2. Then, take 54mL PO daily through 11-9. Then, stop. Take w/ food. (Patient not taking: Reported on 08/23/2019) 100 mL 1  . meclizine (ANTIVERT) 25 MG tablet meclizine 25 mg tablet    . traMADol (ULTRAM) 50 MG tablet Take 1 tablet (50 mg total) by mouth every 6 (six) hours as needed for moderate pain. (Patient not taking: Reported on 08/23/2019) 20 tablet 0   No current facility-administered medications for this encounter.     Physical Findings: The patient is in no acute distress. Patient is alert and oriented.  height is 5\' 8"  (1.727 m) and weight is 159 lb 8 oz (72.3 kg). Her temporal temperature is 98.2 F (36.8 C). Her blood pressure is 120/74 and her pulse is 75. Her respiration is 18 and oxygen saturation is 100%. .  Finger to nose testing and RAM's intact. No nystagmus. No thrush in mouth. Gait is slightly unsteady. Ambulatory.  Lab Findings: Lab Results  Component Value Date   WBC 11.2 (H) 07/07/2019   HGB 12.8 07/07/2019   HCT 37.6 07/07/2019   MCV 90.8 07/07/2019   PLT 244 07/07/2019     Radiographic Findings: No results found.  Impression/Plan: She is doing well.  Based on how she is doing clinically I am hopeful that she had a good response to stereotactic radiosurgery.  I recommended that she continue to taper her steroid  dose for 2 more weeks.  She will take 2 mg daily for the next week and then 2 mg every other day for the week thereafter before stopping.  [facility] will arrange an MRI of her brain for 2 months from now the patient will follow-up with neurosurgery after that scan.  I will see her for the subsequent follow-up next year.  Follow-up with medical oncology in December as scheduled.  I spent 15 minutes minutes face to face with the patient and more than 50% of that time was spent in counseling and/or coordination of care. _____________________________________   Eppie Gibson, MD   This document serves as a record of services personally performed by Eppie Gibson, MD. It was created on her behalf by Wilburn Mylar, a trained medical scribe. The creation of this record is based on the scribe's personal observations and the provider's statements to them. This document has been checked and approved by the attending provider.

## 2019-08-23 ENCOUNTER — Ambulatory Visit
Admission: RE | Admit: 2019-08-23 | Discharge: 2019-08-23 | Disposition: A | Discharge status: Home / Self Care | Payer: Medicare HMO | Source: Ambulatory Visit | Attending: Radiation Oncology | Admitting: Radiation Oncology

## 2019-08-23 ENCOUNTER — Other Ambulatory Visit: Payer: Self-pay

## 2019-08-23 ENCOUNTER — Encounter: Payer: Self-pay | Admitting: Radiation Oncology

## 2019-08-23 DIAGNOSIS — Z87891 Personal history of nicotine dependence: Secondary | ICD-10-CM | POA: Diagnosis not present

## 2019-08-23 DIAGNOSIS — C3490 Malignant neoplasm of unspecified part of unspecified bronchus or lung: Secondary | ICD-10-CM | POA: Insufficient documentation

## 2019-08-23 DIAGNOSIS — C7931 Secondary malignant neoplasm of brain: Secondary | ICD-10-CM | POA: Insufficient documentation

## 2019-08-23 DIAGNOSIS — Z79899 Other long term (current) drug therapy: Secondary | ICD-10-CM | POA: Insufficient documentation

## 2019-08-23 DIAGNOSIS — Z923 Personal history of irradiation: Secondary | ICD-10-CM | POA: Diagnosis not present

## 2019-08-23 HISTORY — DX: Personal history of irradiation: Z92.3

## 2019-08-26 ENCOUNTER — Other Ambulatory Visit: Payer: Self-pay | Admitting: Radiation Therapy

## 2019-08-26 DIAGNOSIS — C7931 Secondary malignant neoplasm of brain: Secondary | ICD-10-CM

## 2019-08-26 DIAGNOSIS — C7949 Secondary malignant neoplasm of other parts of nervous system: Secondary | ICD-10-CM

## 2019-10-17 ENCOUNTER — Other Ambulatory Visit: Payer: Self-pay

## 2019-10-17 ENCOUNTER — Ambulatory Visit
Admission: RE | Admit: 2019-10-17 | Discharge: 2019-10-17 | Disposition: A | Discharge status: Home / Self Care | Payer: Medicare HMO | Source: Ambulatory Visit | Attending: Internal Medicine | Admitting: Internal Medicine

## 2019-10-17 DIAGNOSIS — C7931 Secondary malignant neoplasm of brain: Secondary | ICD-10-CM

## 2019-10-17 MED ORDER — GADOBENATE DIMEGLUMINE 529 MG/ML IV SOLN
15.0000 mL | Freq: Once | INTRAVENOUS | Status: AC | PRN
  Administered 2019-10-17: 15 mL via INTRAVENOUS

## 2019-10-21 ENCOUNTER — Inpatient Hospital Stay: Payer: Medicare HMO | Attending: Internal Medicine | Admitting: Internal Medicine

## 2019-10-21 ENCOUNTER — Other Ambulatory Visit: Payer: Self-pay

## 2019-10-21 ENCOUNTER — Telehealth: Payer: Self-pay | Admitting: Internal Medicine

## 2019-10-21 VITALS — BP 120/83 | HR 83 | Temp 98.2°F | Resp 18 | Ht 68.0 in | Wt 156.1 lb

## 2019-10-21 DIAGNOSIS — C7931 Secondary malignant neoplasm of brain: Secondary | ICD-10-CM | POA: Insufficient documentation

## 2019-10-21 DIAGNOSIS — C3490 Malignant neoplasm of unspecified part of unspecified bronchus or lung: Secondary | ICD-10-CM | POA: Insufficient documentation

## 2019-10-21 NOTE — Telephone Encounter (Signed)
Scheduled appt per 12/14 los.  Printed calendar and avs.

## 2019-10-21 NOTE — Progress Notes (Signed)
Hailey Hill, Hailey Hill  Interval Evaluation  Date of Service: 10/21/19 Patient Name: Hailey Hill Patient MRN: [id number] Patient DOB: 10/03/1951 Provider: Ventura Sellers, MD  Identifying Statement:  Hailey Hill is a 68 y.o. female with Metastatic cancer to brain [facility]) [C79.31]   Primary Cancer: Small Cell Lung  CNS Oncologic History: 2012: Undergoes PCI 07/22/19: Completes 3 frx SRS to cerebllar metastases with Isidore Moos  Interval History:  Hailey Hill is clinically stable today.  She has not experienced worsening imbalance.  No further "fall episodes".  Successfully weaned off steroids.  Has restaging imaging scheduled for later this month.  presents today to review CNS tumor burden and neurologic deficits.  She describes several weeks ago experiencing multiple falls due to feeling off balanced and vertiginous.  Although these symptoms were described as new, she does have several months history of difficulty walking at night without the lights on.  During one fall, she broke her left arm, requiring hospitalization.  CNS imaging demonstrated 2 enhancing cerebellar metastases.  Systemic imaging had been stable in July, as she has been on observation for several years; last brain MRI on record was in 2012 following PCI.  At present she is at a rehab facility.  Main deficit is difficulty walking, and though she is able to walk some on her own, she doesn't feel safe and is requiring assistance.  Continues on decadron taper currently 4mg  TID.    Medications: No current outpatient medications on file prior to visit.   No current facility-administered medications on file prior to visit.    Allergies:  Allergies  Allergen Reactions  . Promethazine Hcl Other (See Comments)    Pt reports feeling shaky and "sick" after phenergan   Past Medical History:  Past Medical History:  Diagnosis Date  .  Complication of anesthesia    had complications with "gas buildup" after tubal lig  . Dyslipidemia   . History of radiation therapy 07/16/19, 07/19/19, 07/22/19   SRS radiation to 2 cerebellum targets.   . Lung cancer ([place]) dx'd 07/2010   sm cell   . Peripheral neuropathy   . SVC syndrome   . Thrombus 03/00/9233   Left basilic vein,  Left subclavian vein   Past Surgical History:  Past Surgical History:  Procedure Laterality Date  . CERVICAL BIOPSY    . CHOLECYSTECTOMY N/A 07/25/2016   Procedure: LAPAROSCOPIC CHOLECYSTECTOMY;  Surgeon: Coralie Keens, MD;  Location: [facility];  Service: General;  Laterality: N/A;  . TONSILLECTOMY    . TUBAL LIGATION     Social History:  Social History   Socioeconomic History  . Marital status: Married    Spouse name: Not on file  . Number of children: Not on file  . Years of education: Not on file  . Highest education level: Not on file  Occupational History  . Not on file  Tobacco Use  . Smoking status: Former Smoker    Quit date: 03/08/2010    Years since quitting: 9.6  . Smokeless tobacco: Never Used  Substance and Sexual Activity  . Alcohol use: No  . Drug use: No  . Sexual activity: Never    Birth control/protection: Surgical  Other Topics Concern  . Not on file  Social History Narrative  . Not on file   Social Determinants of Health   Financial Resource Strain:   . Difficulty of Paying Living  Expenses: Not on file  Food Insecurity:   . Worried About Charity fundraiser in the Last Year: Not on file  . Ran Out of Food in the Last Year: Not on file  Transportation Needs: No Transportation Needs  . Lack of Transportation (Medical): No  . Lack of Transportation (Non-Medical): No  Physical Activity:   . Days of Exercise per Week: Not on file  . Minutes of Exercise per Session: Not on file  Stress:   . Feeling of Stress : Not on file  Social Connections:   . Frequency of Communication with Friends and Family:  Not on file  . Frequency of Social Gatherings with Friends and Family: Not on file  . Attends Religious Services: Not on file  . Active Member of Clubs or Organizations: Not on file  . Attends Archivist Meetings: Not on file  . Marital Status: Not on file  Intimate Partner Violence: Not At Risk  . Fear of Current or Ex-Partner: No  . Emotionally Abused: No  . Physically Abused: No  . Sexually Abused: No   Family History:  Family History  Problem Relation Age of Onset  . Nephrolithiasis Son   . Heart attack Father   . Heart attack Paternal Uncle   . Heart attack Paternal Grandfather     Review of Systems: Constitutional: Denies fevers, chills or abnormal weight loss Eyes: Denies blurriness of vision Ears, nose, mouth, throat, and face: Denies mucositis or sore throat Respiratory: Denies cough, dyspnea or wheezes Cardiovascular: Denies palpitation, chest discomfort or lower extremity swelling Gastrointestinal:  Denies nausea, constipation, diarrhea GU: Denies dysuria or incontinence Skin: Denies abnormal skin rashes Neurological: Per HPI Musculoskeletal: Denies joint pain, back or neck discomfort. No decrease in ROM Behavioral/Psych: Denies anxiety, disturbance in thought content, and mood instability   Physical Exam: Vitals:   10/21/19 1128 10/21/19 1138  BP: 126/68 120/83  Pulse: 83   Resp: 18   Temp: 98.2 F (36.8 C)   SpO2: 98%    KPS: 80. General: Alert, cooperative, pleasant, in no acute distress Head: Normal EENT: No conjunctival injection or scleral icterus. Oral mucosa moist Lungs: Resp effort normal Cardiac: Regular rate and rhythm Abdomen: Soft, non-distended abdomen Skin: No rashes cyanosis or petechiae. Extremities: No clubbing or edema  Neurologic Exam: Mental Status: Awake, alert, attentive to examiner. Oriented to self and environment. Language is fluent with intact comprehension.  Cranial Nerves: Visual acuity is grossly normal.  Visual fields are full. Extra-ocular movements intact. No ptosis. Face is symmetric, tongue midline. Motor: Tone and bulk are normal. Power is full in both arms and legs. Reflexes are symmetric, no pathologic reflexes present. Intact finger to nose bilaterally Sensory: Decreased to ankles in stocking pattern Gait: independent   Labs: I have reviewed the data as listed    Component Value Date/Time   NA 139 07/11/2019 0555   NA 142 04/25/2017 0805   K 4.4 07/11/2019 0555   K 4.5 04/25/2017 0805   CL 106 07/11/2019 0555   CL 108 (H) 01/09/2013 0913   CO2 22 07/11/2019 0555   CO2 26 04/25/2017 0805   GLUCOSE 110 (H) 07/11/2019 0555   GLUCOSE 95 04/25/2017 0805   GLUCOSE 101 (H) 01/09/2013 0913   BUN 23 07/11/2019 0555   BUN 12.8 04/25/2017 0805   CREATININE 0.79 07/11/2019 0555   CREATININE 0.90 05/08/2019 0739   CREATININE 0.9 04/25/2017 0805   CALCIUM 9.1 07/11/2019 0555   CALCIUM 9.6 04/25/2017 0805  PROT 6.7 05/08/2019 0739   PROT 6.6 04/25/2017 0805   ALBUMIN 3.9 05/08/2019 0739   ALBUMIN 3.8 04/25/2017 0805   AST 13 (L) 05/08/2019 0739   AST 13 04/25/2017 0805   ALT 12 05/08/2019 0739   ALT 15 04/25/2017 0805   ALKPHOS 94 05/08/2019 0739   ALKPHOS 79 04/25/2017 0805   BILITOT 0.3 05/08/2019 0739   BILITOT 0.47 04/25/2017 0805   GFRNONAA >60 07/11/2019 0555   GFRNONAA >60 05/08/2019 0739   GFRAA >60 07/11/2019 0555   GFRAA >60 05/08/2019 0739   Lab Results  Component Value Date   WBC 11.2 (H) 07/07/2019   NEUTROABS 9.3 (H) 07/07/2019   HGB 12.8 07/07/2019   HCT 37.6 07/07/2019   MCV 90.8 07/07/2019   PLT 244 07/07/2019    Imaging:  MR Brain W Wo Contrast  Result Date: 10/17/2019 CLINICAL DATA:  Metastatic lung cancer, follow-up EXAM: MRI HEAD WITHOUT AND WITH CONTRAST TECHNIQUE: Multiplanar, multiecho pulse sequences of the brain and surrounding structures were obtained without and with intravenous contrast. CONTRAST:  68mL MULTIHANCE GADOBENATE  DIMEGLUMINE 529 MG/ML IV SOLN COMPARISON:  07/09/2019 FINDINGS: Brain: Right cerebellar lesion with peripheral irregular enhancement measures smaller in size. Representative axial measurements of 2.7 x 2.1 cm on series 11, image 34 (previously 2.9 x 2.2 cm). Midline cerebellar vermis lesion also measures decreased in size at 0.9 x 1.7 cm (previously 1.6 x 2.2 cm). Associated edema is decreased and there is resolution of mass effect on the cerebral aqueduct. There is no new mass or abnormal enhancement.  No acute infarction. Vascular: Major vessel flow voids at the skull base are preserved Skull and upper cervical spine: Normal marrow signal. Sinuses/Orbits: Susceptibility artifact obscures the right maxillary sinus and adjacent nasal cavity. Trace paranasal sinus mucosal thickening. Orbits are unremarkable. Other: Mastoid air cells are clear. IMPRESSION: Decrease in size of cerebellar lesions since 07/09/2019 with improvement in edema and mass effect. No new mass or abnormal enhancement. Electronically Signed   By: Macy Mis M.D.   On: 10/17/2019 14:58    Assessment/Plan Metastatic cancer to brain [facility]) [C79.31]  Ms. Lemon is clinically and radiographically stable today.  She should return in 3 months with MRI brain for evaluation, or sooner if needed.  We appreciate the opportunity to participate in the care of Hailey Hill.    All questions were answered. The patient knows to call the clinic with any problems, questions or concerns. No barriers to learning were detected.  The total time spent in the encounter was 25 minutes and more than 50% was on counseling and review of test results   Ventura Sellers, MD Medical Director of Neuro-Oncology [facility] [place] 10/21/19 4:39 PM

## 2019-10-22 ENCOUNTER — Ambulatory Visit: Payer: Medicare HMO | Admitting: Internal Medicine

## 2019-10-29 ENCOUNTER — Encounter (HOSPITAL_COMMUNITY): Payer: Self-pay

## 2019-10-29 ENCOUNTER — Inpatient Hospital Stay: Payer: Medicare HMO

## 2019-10-29 ENCOUNTER — Other Ambulatory Visit: Payer: Self-pay

## 2019-10-29 ENCOUNTER — Ambulatory Visit (HOSPITAL_COMMUNITY)
Admission: RE | Admit: 2019-10-29 | Discharge: 2019-10-29 | Disposition: A | Discharge status: Home / Self Care | Payer: Medicare HMO | Source: Ambulatory Visit | Attending: Internal Medicine | Admitting: Internal Medicine

## 2019-10-29 DIAGNOSIS — C3490 Malignant neoplasm of unspecified part of unspecified bronchus or lung: Secondary | ICD-10-CM | POA: Diagnosis not present

## 2019-10-29 LAB — CBC WITH DIFFERENTIAL (CANCER CENTER ONLY)
Abs Immature Granulocytes: 0.04 10*3/uL (ref 0.00–0.07)
Basophils Absolute: 0.1 10*3/uL (ref 0.0–0.1)
Basophils Relative: 1 %
Eosinophils Absolute: 0.1 10*3/uL (ref 0.0–0.5)
Eosinophils Relative: 1 %
HCT: 41.4 % (ref 36.0–46.0)
Hemoglobin: 13.9 g/dL (ref 12.0–15.0)
Immature Granulocytes: 1 %
Lymphocytes Relative: 24 %
Lymphs Abs: 1.5 10*3/uL (ref 0.7–4.0)
MCH: 31 pg (ref 26.0–34.0)
MCHC: 33.6 g/dL (ref 30.0–36.0)
MCV: 92.4 fL (ref 80.0–100.0)
Monocytes Absolute: 0.4 10*3/uL (ref 0.1–1.0)
Monocytes Relative: 6 %
Neutro Abs: 4.2 10*3/uL (ref 1.7–7.7)
Neutrophils Relative %: 67 %
Platelet Count: 260 10*3/uL (ref 150–400)
RBC: 4.48 MIL/uL (ref 3.87–5.11)
RDW: 12.9 % (ref 11.5–15.5)
WBC Count: 6.2 10*3/uL (ref 4.0–10.5)
nRBC: 0 % (ref 0.0–0.2)

## 2019-10-29 LAB — CMP (CANCER CENTER ONLY)
ALT: 8 U/L (ref 0–44)
AST: 11 U/L — ABNORMAL LOW (ref 15–41)
Albumin: 3.8 g/dL (ref 3.5–5.0)
Alkaline Phosphatase: 82 U/L (ref 38–126)
Anion gap: 11 (ref 5–15)
BUN: 9 mg/dL (ref 8–23)
CO2: 26 mmol/L (ref 22–32)
Calcium: 9.1 mg/dL (ref 8.9–10.3)
Chloride: 106 mmol/L (ref 98–111)
Creatinine: 0.77 mg/dL (ref 0.44–1.00)
GFR, Est AFR Am: >60 mL/min (ref >60)
GFR, Estimated: >60 mL/min (ref >60)
Glucose, Bld: 94 mg/dL (ref 70–99)
Potassium: 4.3 mmol/L (ref 3.5–5.1)
Sodium: 143 mmol/L (ref 135–145)
Total Bilirubin: 0.4 mg/dL (ref 0.3–1.2)
Total Protein: 6.4 g/dL — ABNORMAL LOW (ref 6.5–8.1)

## 2019-10-29 MED ORDER — SODIUM CHLORIDE (PF) 0.9 % IJ SOLN
INTRAMUSCULAR | Status: AC
  Filled 2019-10-29: qty 50

## 2019-10-29 MED ORDER — IOHEXOL 300 MG/ML  SOLN
100.0000 mL | Freq: Once | INTRAMUSCULAR | Status: AC | PRN
  Administered 2019-10-29: 11:00:00 100 mL via INTRAVENOUS

## 2019-10-31 ENCOUNTER — Inpatient Hospital Stay: Payer: Medicare HMO | Admitting: Internal Medicine

## 2019-10-31 ENCOUNTER — Other Ambulatory Visit: Payer: Self-pay

## 2019-10-31 ENCOUNTER — Encounter: Payer: Self-pay | Admitting: Internal Medicine

## 2019-10-31 VITALS — BP 136/86 | HR 78 | Temp 98.5°F | Resp 18 | Ht 68.0 in | Wt 157.2 lb

## 2019-10-31 DIAGNOSIS — C3490 Malignant neoplasm of unspecified part of unspecified bronchus or lung: Secondary | ICD-10-CM

## 2019-10-31 NOTE — Progress Notes (Signed)
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 She received radiation treatments to the chest from 10/04/2010 through 11/19/2010. 2) prophylactic whole- brain radiation 2500 cGy in 10 fractions from 12/07/2010 through 12/20/2010. 3) status post stereotactic radiotherapy to the metastatic brain lesion under the care of Dr. Isidore Moos in September 2020  CURRENT THERAPY: Observation.  INTERVAL HISTORY: Hailey Hill 68 y.o. female returns to the clinic today for follow-up visit.  The patient is feeling fine today with no concerning complaints except for occasional dizzy spells.  She had recent MRI of the brain and she was seen by Dr. Mickeal Skinner.  There was no evidence for progression in her brain.  The patient denied having any current chest pain, shortness of breath, cough or hemoptysis.  She denied having any fever or chills.  She has no nausea, vomiting, diarrhea or constipation.  She denied having any headache or visual changes.  She has no recent weight loss or night sweats.  The patient had repeat CT scan of the chest, abdomen pelvis performed recently and she is here for evaluation and discussion of her scan results.  MEDICAL HISTORY: Past Medical History:  Diagnosis Date   Complication of anesthesia    had complications with "gas buildup" after tubal lig   Dyslipidemia    History of radiation  therapy 07/16/19, 07/19/19, 07/22/19   SRS radiation to 2 cerebellum targets.    Lung cancer ([place]) dx'd 07/2010   sm cell    Peripheral neuropathy    SVC syndrome    Thrombus 81/19/1478   Left basilic vein,  Left subclavian vein    ALLERGIES:  is allergic to promethazine hcl.  MEDICATIONS:  No current outpatient medications on file.   No current facility-administered medications for this visit.    REVIEW OF SYSTEMS:  A comprehensive review of systems was negative except for: Constitutional: positive for fatigue Neurological: positive for dizziness   PHYSICAL EXAMINATION: General appearance: alert, cooperative, fatigued and no distress Head: Normocephalic, without obvious abnormality, atraumatic Neck: no adenopathy, no JVD, supple, symmetrical, trachea midline and thyroid not enlarged, symmetric, no tenderness/mass/nodules Lymph nodes: Cervical, supraclavicular, and axillary nodes normal. Resp: clear to auscultation bilaterally Back: symmetric, no curvature. ROM normal. No CVA tenderness. Cardio: regular rate and rhythm, S1, S2 normal, no murmur, click, rub or gallop GI: soft, non-tender; bowel sounds normal; no masses,  no organomegaly Extremities: extremities normal, atraumatic, no cyanosis or edema  ECOG PERFORMANCE STATUS: 1 - Symptomatic but completely ambulatory  Blood pressure 136/86, pulse 78, temperature 98.5 F (36.9 C), temperature source Temporal, resp. rate 18, height 5\' 8"  (1.727 m), weight 157 lb 3.2 oz (71.3 kg), SpO2 100 %.  LABORATORY DATA: Lab Results  Component Value Date   WBC 6.2 10/29/2019   HGB 13.9 10/29/2019   HCT 41.4 10/29/2019   MCV 92.4 10/29/2019   PLT 260 10/29/2019      Chemistry  Component Value Date/Time   NA 143 10/29/2019 0949   NA 142 04/25/2017 0805   K 4.3 10/29/2019 0949   K 4.5 04/25/2017 0805   CL 106 10/29/2019 0949   CL 108 (H) 01/09/2013 0913   CO2 26 10/29/2019 0949   CO2 26 04/25/2017 0805   BUN 9 10/29/2019  0949   BUN 12.8 04/25/2017 0805   CREATININE 0.77 10/29/2019 0949   CREATININE 0.9 04/25/2017 0805      Component Value Date/Time   CALCIUM 9.1 10/29/2019 0949   CALCIUM 9.6 04/25/2017 0805   ALKPHOS 82 10/29/2019 0949   ALKPHOS 79 04/25/2017 0805   AST 11 (L) 10/29/2019 0949   AST 13 04/25/2017 0805   ALT 8 10/29/2019 0949   ALT 15 04/25/2017 0805   BILITOT 0.4 10/29/2019 0949   BILITOT 0.47 04/25/2017 0805       RADIOGRAPHIC STUDIES: CT Chest W Contrast  Result Date: 10/29/2019 CLINICAL DATA:  Extensive stage small cell lung cancer diagnosed 2011 status post chemotherapy and radiation therapy. Restaging. EXAM: CT CHEST, ABDOMEN, AND PELVIS WITH CONTRAST TECHNIQUE: Multidetector CT imaging of the chest, abdomen and pelvis was performed following the standard protocol during bolus administration of intravenous contrast. CONTRAST:  156mL OMNIPAQUE IOHEXOL 300 MG/ML  SOLN COMPARISON:  05/08/2019 chest CT.  07/13/2010 PET-CT. FINDINGS: CT CHEST FINDINGS Cardiovascular: Normal heart size. No significant pericardial effusion/thickening. Atherosclerotic nonaneurysmal thoracic aorta. Normal caliber pulmonary arteries. No central pulmonary emboli. Mediastinum/Nodes: Subcentimeter hypodense posterior left thyroid nodule is stable. Unremarkable esophagus. No pathologically enlarged axillary, mediastinal or hilar lymph nodes. Lungs/Pleura: No pneumothorax. No pleural effusion. No acute consolidative airspace disease or lung masses. Peripheral right middle lobe 3 mm solid pulmonary nodule (series 6/image 88), stable since at least 05/01/2013 chest CT, considered benign. No new significant pulmonary nodules. Sharply marginated bandlike left perihilar consolidation with associated mild volume loss, distortion and bronchiectasis, compatible with radiation fibrosis, stable. Minimal right upper paramediastinal fibrosis. Musculoskeletal: No aggressive appearing focal osseous lesions. Mild thoracic spondylosis.  CT ABDOMEN PELVIS FINDINGS Hepatobiliary: Normal liver size. Simple left liver dome 1.2 cm cyst. Subcentimeter hypodense anterolateral segment left liver lobe lesion, too small to characterize, unchanged. No new liver lesions. Cholecystectomy. No biliary ductal dilatation. Pancreas: Normal, with no mass or duct dilation. Spleen: Normal size. No mass. Adrenals/Urinary Tract: Normal adrenals. Normal kidneys with no hydronephrosis and no renal mass. Normal bladder. Stomach/Bowel: Normal non-distended stomach. Normal caliber small bowel with no small bowel wall thickening. Appendix not discretely visualized. Oral contrast transits to the left colon. Normal large bowel with no significant diverticulosis, large bowel wall thickening or pericolonic fat stranding. Vascular/Lymphatic: Atherosclerotic nonaneurysmal abdominal aorta. Patent portal, splenic, hepatic and renal veins. No pathologically enlarged lymph nodes in the abdomen or pelvis. Reproductive: Grossly normal uterus.  No adnexal mass. Other: No pneumoperitoneum, ascites or focal fluid collection. Musculoskeletal: No aggressive appearing focal osseous lesions. Moderate lower lumbar spondylosis. IMPRESSION: 1. Stable post treatment change in the perihilar left lung. 2. No findings of metastatic disease in the chest, abdomen or pelvis. 3.  Aortic Atherosclerosis (ICD10-I70.0). Electronically Signed   By: Ilona Sorrel M.D.   On: 10/29/2019 15:05   MR Brain W Wo Contrast  Result Date: 10/17/2019 CLINICAL DATA:  Metastatic lung cancer, follow-up EXAM: MRI HEAD WITHOUT AND WITH CONTRAST TECHNIQUE: Multiplanar, multiecho pulse sequences of the brain and surrounding structures were obtained without and with intravenous contrast. CONTRAST:  19mL MULTIHANCE GADOBENATE DIMEGLUMINE 529 MG/ML IV SOLN COMPARISON:  07/09/2019 FINDINGS: Brain:  Right cerebellar lesion with peripheral irregular enhancement measures smaller in size. Representative axial measurements of 2.7 x  2.1 cm on series 11, image 34 (previously 2.9 x 2.2 cm). Midline cerebellar vermis lesion also measures decreased in size at 0.9 x 1.7 cm (previously 1.6 x 2.2 cm). Associated edema is decreased and there is resolution of mass effect on the cerebral aqueduct. There is no new mass or abnormal enhancement.  No acute infarction. Vascular: Major vessel flow voids at the skull base are preserved Skull and upper cervical spine: Normal marrow signal. Sinuses/Orbits: Susceptibility artifact obscures the right maxillary sinus and adjacent nasal cavity. Trace paranasal sinus mucosal thickening. Orbits are unremarkable. Other: Mastoid air cells are clear. IMPRESSION: Decrease in size of cerebellar lesions since 07/09/2019 with improvement in edema and mass effect. No new mass or abnormal enhancement. Electronically Signed   By: Macy Mis M.D.   On: 10/17/2019 14:58   CT Abdomen Pelvis W Contrast  Result Date: 10/29/2019 CLINICAL DATA:  Extensive stage small cell lung cancer diagnosed 2011 status post chemotherapy and radiation therapy. Restaging. EXAM: CT CHEST, ABDOMEN, AND PELVIS WITH CONTRAST TECHNIQUE: Multidetector CT imaging of the chest, abdomen and pelvis was performed following the standard protocol during bolus administration of intravenous contrast. CONTRAST:  174mL OMNIPAQUE IOHEXOL 300 MG/ML  SOLN COMPARISON:  05/08/2019 chest CT.  07/13/2010 PET-CT. FINDINGS: CT CHEST FINDINGS Cardiovascular: Normal heart size. No significant pericardial effusion/thickening. Atherosclerotic nonaneurysmal thoracic aorta. Normal caliber pulmonary arteries. No central pulmonary emboli. Mediastinum/Nodes: Subcentimeter hypodense posterior left thyroid nodule is stable. Unremarkable esophagus. No pathologically enlarged axillary, mediastinal or hilar lymph nodes. Lungs/Pleura: No pneumothorax. No pleural effusion. No acute consolidative airspace disease or lung masses. Peripheral right middle lobe 3 mm solid pulmonary nodule  (series 6/image 88), stable since at least 05/01/2013 chest CT, considered benign. No new significant pulmonary nodules. Sharply marginated bandlike left perihilar consolidation with associated mild volume loss, distortion and bronchiectasis, compatible with radiation fibrosis, stable. Minimal right upper paramediastinal fibrosis. Musculoskeletal: No aggressive appearing focal osseous lesions. Mild thoracic spondylosis. CT ABDOMEN PELVIS FINDINGS Hepatobiliary: Normal liver size. Simple left liver dome 1.2 cm cyst. Subcentimeter hypodense anterolateral segment left liver lobe lesion, too small to characterize, unchanged. No new liver lesions. Cholecystectomy. No biliary ductal dilatation. Pancreas: Normal, with no mass or duct dilation. Spleen: Normal size. No mass. Adrenals/Urinary Tract: Normal adrenals. Normal kidneys with no hydronephrosis and no renal mass. Normal bladder. Stomach/Bowel: Normal non-distended stomach. Normal caliber small bowel with no small bowel wall thickening. Appendix not discretely visualized. Oral contrast transits to the left colon. Normal large bowel with no significant diverticulosis, large bowel wall thickening or pericolonic fat stranding. Vascular/Lymphatic: Atherosclerotic nonaneurysmal abdominal aorta. Patent portal, splenic, hepatic and renal veins. No pathologically enlarged lymph nodes in the abdomen or pelvis. Reproductive: Grossly normal uterus.  No adnexal mass. Other: No pneumoperitoneum, ascites or focal fluid collection. Musculoskeletal: No aggressive appearing focal osseous lesions. Moderate lower lumbar spondylosis. IMPRESSION: 1. Stable post treatment change in the perihilar left lung. 2. No findings of metastatic disease in the chest, abdomen or pelvis. 3.  Aortic Atherosclerosis (ICD10-I70.0). Electronically Signed   By: Ilona Sorrel M.D.   On: 10/29/2019 15:05    ASSESSMENT AND PLAN:  This is a very pleasant 68 years old white female with metastatic small cell  lung cancer with recent brain metastasis diagnosed in August 2020.  She was initially diagnosed with limited stage small cell lung cancer diagnosed in September 2011 status post systemic chemotherapy  with cisplatin and etoposide for 6 cycles concurrent with radiation and followed by prophylactic cranial irradiation.  She has been on observation for more than 9 years with no evidence of systemic metastasis except for the recent brain metastasis. The patient received stereotactic radiotherapy to the metastatic brain lesions in September 2020. The patient is currently on observation and she is feeling fine with no concerning complaints. Her recent CT scan of the chest, abdomen pelvis showed no concerning findings for disease recurrence or metastasis in the chest, abdomen or pelvis. MRI of the brain showed no new lesions and there was improvement of the previous metastasis. I discussed the imaging studies with the patient today and recommended for her to continue on observation with repeat CT scan of the chest, abdomen and pelvis in 4 months. For the brain metastasis, she is followed by Dr. Mickeal Skinner and Dr. Isidore Moos. She was advised to call immediately if she has any concerning symptoms in the interval. The patient voices understanding of current disease status and treatment options and is in agreement with the current care plan. All questions were answered. The patient knows to call the clinic with any problems, questions or concerns. We can certainly see the patient much sooner if necessary.  Disclaimer: This note was dictated with voice recognition software. Similar sounding words can inadvertently be transcribed and may not be corrected upon review.

## 2019-11-05 ENCOUNTER — Telehealth: Payer: Self-pay | Admitting: Internal Medicine

## 2019-11-05 NOTE — Telephone Encounter (Signed)
Scheduled per los. Called and left msg. Mailed printout  °

## 2019-11-27 ENCOUNTER — Other Ambulatory Visit: Payer: Self-pay

## 2019-11-27 ENCOUNTER — Encounter (HOSPITAL_COMMUNITY): Payer: Self-pay | Admitting: Internal Medicine

## 2019-11-27 ENCOUNTER — Emergency Department (HOSPITAL_COMMUNITY): Payer: Medicare HMO

## 2019-11-27 ENCOUNTER — Inpatient Hospital Stay (HOSPITAL_COMMUNITY)
Admission: EM | Admit: 2019-11-27 | Discharge: 2019-12-04 | DRG: 054 | Disposition: A | Discharge status: Rehabilitation Facility | Payer: Medicare HMO | Attending: Internal Medicine | Admitting: Internal Medicine

## 2019-11-27 DIAGNOSIS — I10 Essential (primary) hypertension: Secondary | ICD-10-CM | POA: Diagnosis present

## 2019-11-27 DIAGNOSIS — K5900 Constipation, unspecified: Secondary | ICD-10-CM | POA: Diagnosis not present

## 2019-11-27 DIAGNOSIS — E860 Dehydration: Secondary | ICD-10-CM | POA: Diagnosis not present

## 2019-11-27 DIAGNOSIS — S0219XA Other fracture of base of skull, initial encounter for closed fracture: Secondary | ICD-10-CM

## 2019-11-27 DIAGNOSIS — Z86718 Personal history of other venous thrombosis and embolism: Secondary | ICD-10-CM

## 2019-11-27 DIAGNOSIS — R29701 NIHSS score 1: Secondary | ICD-10-CM | POA: Diagnosis present

## 2019-11-27 DIAGNOSIS — Z8249 Family history of ischemic heart disease and other diseases of the circulatory system: Secondary | ICD-10-CM | POA: Diagnosis not present

## 2019-11-27 DIAGNOSIS — I614 Nontraumatic intracerebral hemorrhage in cerebellum: Secondary | ICD-10-CM | POA: Diagnosis not present

## 2019-11-27 DIAGNOSIS — G629 Polyneuropathy, unspecified: Secondary | ICD-10-CM | POA: Diagnosis present

## 2019-11-27 DIAGNOSIS — H532 Diplopia: Secondary | ICD-10-CM | POA: Diagnosis present

## 2019-11-27 DIAGNOSIS — Z7189 Other specified counseling: Secondary | ICD-10-CM

## 2019-11-27 DIAGNOSIS — Y92007 Garden or yard of unspecified non-institutional (private) residence as the place of occurrence of the external cause: Secondary | ICD-10-CM

## 2019-11-27 DIAGNOSIS — R471 Dysarthria and anarthria: Secondary | ICD-10-CM | POA: Diagnosis present

## 2019-11-27 DIAGNOSIS — R278 Other lack of coordination: Secondary | ICD-10-CM | POA: Diagnosis present

## 2019-11-27 DIAGNOSIS — G9349 Other encephalopathy: Secondary | ICD-10-CM | POA: Diagnosis present

## 2019-11-27 DIAGNOSIS — C7931 Secondary malignant neoplasm of brain: Secondary | ICD-10-CM | POA: Diagnosis present

## 2019-11-27 DIAGNOSIS — C3490 Malignant neoplasm of unspecified part of unspecified bronchus or lung: Secondary | ICD-10-CM | POA: Diagnosis present

## 2019-11-27 DIAGNOSIS — Z87891 Personal history of nicotine dependence: Secondary | ICD-10-CM | POA: Diagnosis not present

## 2019-11-27 DIAGNOSIS — Z888 Allergy status to other drugs, medicaments and biological substances status: Secondary | ICD-10-CM

## 2019-11-27 DIAGNOSIS — S066X9A Traumatic subarachnoid hemorrhage with loss of consciousness of unspecified duration, initial encounter: Secondary | ICD-10-CM | POA: Diagnosis present

## 2019-11-27 DIAGNOSIS — N179 Acute kidney failure, unspecified: Secondary | ICD-10-CM | POA: Diagnosis not present

## 2019-11-27 DIAGNOSIS — R159 Full incontinence of feces: Secondary | ICD-10-CM | POA: Diagnosis present

## 2019-11-27 DIAGNOSIS — R569 Unspecified convulsions: Secondary | ICD-10-CM | POA: Diagnosis not present

## 2019-11-27 DIAGNOSIS — R112 Nausea with vomiting, unspecified: Secondary | ICD-10-CM

## 2019-11-27 DIAGNOSIS — Z515 Encounter for palliative care: Secondary | ICD-10-CM | POA: Diagnosis not present

## 2019-11-27 DIAGNOSIS — I612 Nontraumatic intracerebral hemorrhage in hemisphere, unspecified: Secondary | ICD-10-CM | POA: Diagnosis present

## 2019-11-27 DIAGNOSIS — I629 Nontraumatic intracranial hemorrhage, unspecified: Secondary | ICD-10-CM

## 2019-11-27 DIAGNOSIS — F4322 Adjustment disorder with anxiety: Secondary | ICD-10-CM | POA: Diagnosis not present

## 2019-11-27 DIAGNOSIS — E785 Hyperlipidemia, unspecified: Secondary | ICD-10-CM | POA: Diagnosis present

## 2019-11-27 DIAGNOSIS — Z9049 Acquired absence of other specified parts of digestive tract: Secondary | ICD-10-CM | POA: Diagnosis not present

## 2019-11-27 DIAGNOSIS — Z923 Personal history of irradiation: Secondary | ICD-10-CM | POA: Diagnosis not present

## 2019-11-27 DIAGNOSIS — N390 Urinary tract infection, site not specified: Secondary | ICD-10-CM | POA: Diagnosis present

## 2019-11-27 DIAGNOSIS — W19XXXA Unspecified fall, initial encounter: Secondary | ICD-10-CM | POA: Diagnosis present

## 2019-11-27 DIAGNOSIS — D72829 Elevated white blood cell count, unspecified: Secondary | ICD-10-CM

## 2019-11-27 DIAGNOSIS — Z20822 Contact with and (suspected) exposure to covid-19: Secondary | ICD-10-CM | POA: Diagnosis present

## 2019-11-27 DIAGNOSIS — I609 Nontraumatic subarachnoid hemorrhage, unspecified: Secondary | ICD-10-CM

## 2019-11-27 DIAGNOSIS — Z79899 Other long term (current) drug therapy: Secondary | ICD-10-CM

## 2019-11-27 LAB — I-STAT CHEM 8, ED
BUN: 13 mg/dL (ref 8–23)
Calcium, Ion: 1.18 mmol/L (ref 1.15–1.40)
Chloride: 104 mmol/L (ref 98–111)
Creatinine, Ser: 0.7 mg/dL (ref 0.44–1.00)
Glucose, Bld: 150 mg/dL — ABNORMAL HIGH (ref 70–99)
HCT: 43 % (ref 36.0–46.0)
Hemoglobin: 14.6 g/dL (ref 12.0–15.0)
Potassium: 3.5 mmol/L (ref 3.5–5.1)
Sodium: 139 mmol/L (ref 135–145)
TCO2: 22 mmol/L (ref 22–32)

## 2019-11-27 LAB — URINALYSIS, ROUTINE W REFLEX MICROSCOPIC
Bilirubin Urine: NEGATIVE
Glucose, UA: NEGATIVE mg/dL
Hgb urine dipstick: NEGATIVE
Ketones, ur: 5 mg/dL — AB
Nitrite: NEGATIVE
Protein, ur: NEGATIVE mg/dL
Specific Gravity, Urine: 1.014 (ref 1.005–1.030)
pH: 7 (ref 5.0–8.0)

## 2019-11-27 LAB — DIFFERENTIAL
Abs Immature Granulocytes: 0.11 10*3/uL — ABNORMAL HIGH (ref 0.00–0.07)
Basophils Absolute: 0.1 10*3/uL (ref 0.0–0.1)
Basophils Relative: 0 %
Eosinophils Absolute: 0 10*3/uL (ref 0.0–0.5)
Eosinophils Relative: 0 %
Immature Granulocytes: 1 %
Lymphocytes Relative: 4 %
Lymphs Abs: 0.6 10*3/uL — ABNORMAL LOW (ref 0.7–4.0)
Monocytes Absolute: 0.8 10*3/uL (ref 0.1–1.0)
Monocytes Relative: 5 %
Neutro Abs: 15.7 10*3/uL — ABNORMAL HIGH (ref 1.7–7.7)
Neutrophils Relative %: 90 %

## 2019-11-27 LAB — COMPREHENSIVE METABOLIC PANEL
ALT: 14 U/L (ref 0–44)
AST: 19 U/L (ref 15–41)
Albumin: 4.1 g/dL (ref 3.5–5.0)
Alkaline Phosphatase: 76 U/L (ref 38–126)
Anion gap: 15 (ref 5–15)
BUN: 12 mg/dL (ref 8–23)
CO2: 18 mmol/L — ABNORMAL LOW (ref 22–32)
Calcium: 9.5 mg/dL (ref 8.9–10.3)
Chloride: 107 mmol/L (ref 98–111)
Creatinine, Ser: 0.94 mg/dL (ref 0.44–1.00)
GFR calc Af Amer: >60 mL/min (ref >60)
GFR calc non Af Amer: >60 mL/min (ref >60)
Glucose, Bld: 158 mg/dL — ABNORMAL HIGH (ref 70–99)
Potassium: 3.5 mmol/L (ref 3.5–5.1)
Sodium: 140 mmol/L (ref 135–145)
Total Bilirubin: 0.7 mg/dL (ref 0.3–1.2)
Total Protein: 6.7 g/dL (ref 6.5–8.1)

## 2019-11-27 LAB — CBC
HCT: 44.1 % (ref 36.0–46.0)
Hemoglobin: 14.9 g/dL (ref 12.0–15.0)
MCH: 31 pg (ref 26.0–34.0)
MCHC: 33.8 g/dL (ref 30.0–36.0)
MCV: 91.9 fL (ref 80.0–100.0)
Platelets: 276 10*3/uL (ref 150–400)
RBC: 4.8 MIL/uL (ref 3.87–5.11)
RDW: 12.5 % (ref 11.5–15.5)
WBC: 17.3 10*3/uL — ABNORMAL HIGH (ref 4.0–10.5)
nRBC: 0 % (ref 0.0–0.2)

## 2019-11-27 LAB — RAPID URINE DRUG SCREEN, HOSP PERFORMED
Amphetamines: NOT DETECTED
Barbiturates: NOT DETECTED
Benzodiazepines: NOT DETECTED
Cocaine: NOT DETECTED
Opiates: NOT DETECTED
Tetrahydrocannabinol: NOT DETECTED

## 2019-11-27 LAB — PROTIME-INR
INR: 1 (ref 0.8–1.2)
Prothrombin Time: 12.6 seconds (ref 11.4–15.2)

## 2019-11-27 LAB — RESPIRATORY PANEL BY RT PCR (FLU A&B, COVID)
Influenza A by PCR: NEGATIVE
Influenza B by PCR: NEGATIVE
SARS Coronavirus 2 by RT PCR: NEGATIVE

## 2019-11-27 LAB — ETHANOL: Alcohol, Ethyl (B): <10 mg/dL (ref <10)

## 2019-11-27 LAB — APTT: aPTT: 23 seconds — ABNORMAL LOW (ref 24–36)

## 2019-11-27 LAB — CBG MONITORING, ED: Glucose-Capillary: 152 mg/dL — ABNORMAL HIGH (ref 70–99)

## 2019-11-27 MED ORDER — ACETAMINOPHEN 650 MG RE SUPP: 650.0000 mg | RECTAL | Status: DC | PRN

## 2019-11-27 MED ORDER — ACETAMINOPHEN 160 MG/5ML PO SOLN: 650.0000 mg | ORAL | Status: DC | PRN

## 2019-11-27 MED ORDER — HYDRALAZINE HCL 20 MG/ML IJ SOLN
5.0000 mg | Freq: Four times a day (QID) | INTRAMUSCULAR | Status: DC | PRN
  Administered 2019-11-27: 5 mg via INTRAVENOUS
  Filled 2019-11-27: qty 1

## 2019-11-27 MED ORDER — ONDANSETRON HCL 4 MG/2ML IJ SOLN
4.0000 mg | Freq: Once | INTRAMUSCULAR | Status: AC
  Administered 2019-11-27: 4 mg via INTRAVENOUS
  Filled 2019-11-27: qty 2

## 2019-11-27 MED ORDER — DEXAMETHASONE SODIUM PHOSPHATE 10 MG/ML IJ SOLN
10.0000 mg | Freq: Once | INTRAMUSCULAR | Status: AC
  Administered 2019-11-27: 10 mg via INTRAVENOUS
  Filled 2019-11-27: qty 1

## 2019-11-27 MED ORDER — ACETAMINOPHEN 325 MG PO TABS: 650.0000 mg | ORAL_TABLET | ORAL | Status: DC | PRN

## 2019-11-27 MED ORDER — LEVETIRACETAM IN NACL 1000 MG/100ML IV SOLN
1000.0000 mg | Freq: Once | INTRAVENOUS | Status: AC
  Administered 2019-11-27: 1000 mg via INTRAVENOUS
  Filled 2019-11-27: qty 100

## 2019-11-27 MED ORDER — STROKE: EARLY STAGES OF RECOVERY BOOK
Freq: Once | Status: AC
  Filled 2019-11-27: qty 1

## 2019-11-27 MED ORDER — ONDANSETRON HCL 4 MG/2ML IJ SOLN
4.0000 mg | Freq: Four times a day (QID) | INTRAMUSCULAR | Status: DC | PRN
  Administered 2019-11-27 – 2019-11-30 (×3): 4 mg via INTRAVENOUS
  Filled 2019-11-27 (×4): qty 2

## 2019-11-27 NOTE — H&P (Signed)
TRH H&P    Patient Demographics:    Hailey Hill, is a 69 y.o. female  MRN: [id number]  DOB - [birth year]  Admit Date - 11/27/2019  Referring MD/NP/PA:  Blanchie Dessert  Outpatient Primary MD for the patient is [name], NP  Patient coming from:  home  Chief complaint-  N/v, found out in yard   HPI:    Hailey Hill  is a 69 y.o. 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 Pt was normal at 430 pm and when her husband came back about 6pm found her sitting in the yard confused.   In ED,  T 97.5, P 94 R 22, Bp 152/76  Pox 96% on RA  CT brain  IMPRESSION: 1. Prominent edema within the right greater than left cerebellar hemispheres at site of known metastases. Superimposed acute parenchymal hemorrhage within the right cerebellum measuring 2.6 x 1.9 x 2.2 cm. Posterior fossa mass effect is similar to prior MRI with partial effacement of the fourth ventricle. 2. Small hemorrhagic contusion within the inferolateral right temporal lobe with adjacent small volume acute subarachnoid hemorrhage. 3. Although not definitively identified, an acute fracture of the left temporal bone is suspected given the presence of a left mastoid effusion and adjacent soft tissue gas within the left masticator/parapharyngeal space. 4. Left posterior scalp hematoma.   Wbc 17.3, Hgb 14.9, Plt 276 Na 140, K 3.5,  Bun 12, Creatinine 0.94 Ast 19, Alt 14 INR 1.0  Urinalysis wbc 21-50, rbc 6-10 UDS negative  ED consulted neurology for ICH, and also neurosurgery per ED.  Pt will be admitted for n/v, acute intracranial hemorrhage.       Review of systems:    In addition to the HPI above,  No  Fever-chills, No Headache, No changes with Vision or hearing, No problems swallowing food or Liquids, No Chest pain, Cough or Shortness of Breath, No Abdominal pain, bowel movements are regular, No Blood in stool or Urine, No dysuria, No new skin rashes or bruises, No new joints pains-aches,  No new weakness, tingling, numbness in any extremity, No recent weight gain or loss, No polyuria, polydypsia or polyphagia, No significant Mental Stressors.  All other systems reviewed and are negative.    Past History of the following :    Past Medical History:  Diagnosis Date  . Complication of anesthesia    had complications with "gas buildup" after tubal lig  . Dyslipidemia   . History of radiation therapy 07/16/19, 07/19/19, 07/22/19   SRS radiation to 2 cerebellum targets.   . Lung cancer ([place]) dx'd 07/2010   sm cell   . Peripheral neuropathy   . SVC syndrome   . Thrombus 78/67/6720   Left basilic vein,  Left subclavian vein      Past Surgical History:  Procedure Laterality Date  . CERVICAL BIOPSY    .  CHOLECYSTECTOMY N/A 07/25/2016   Procedure: LAPAROSCOPIC CHOLECYSTECTOMY;  Surgeon: Coralie Keens, MD;  Location: [facility];  Service: General;  Laterality: N/A;  . TONSILLECTOMY    . TUBAL LIGATION        Social History:      Social History   Tobacco Use  . Smoking status: Former Smoker    Quit date: 03/08/2010    Years since quitting: 9.7  . Smokeless tobacco: Never Used  Substance Use Topics  . Alcohol use: No       Family History :     Family History  Problem Relation Age of Onset  . Nephrolithiasis Son   . Heart attack Father   . Heart attack Paternal Uncle   . Heart attack Paternal Grandfather       Home Medications:   Prior to Admission medications   Not on File     Allergies:     Allergies  Allergen Reactions  . Promethazine Hcl Other (See Comments)    Pt reports feeling shaky and "sick" after phenergan     Physical  Exam:   Vitals  Blood pressure (!) 153/78, pulse 91, resp. rate 20, SpO2 100 %.  1.  General: axox1 (person, not place or year)  2. Psychiatric: euthymic  3. Neurologic: Nonfocal, pt moves all 4 ext   4. HEENMT:  Anicteric, pupils 1.67m symmetric, direct, consensual , near intact Neck: no jvd, no bruit  5. Respiratory : CTAB  6. Cardiovascular : rrr s1, s2, no m/g/r  7. Gastrointestinal:  Abd: soft, nt, nd, +bs  8. Skin:  Ext: no c/c/e, no rash  9.Musculoskeletal:  Good ROM    Data Review:    CBC Recent Labs  Lab 11/27/19 1939 11/27/19 1945  WBC 17.3*  --   HGB 14.9 14.6  HCT 44.1 43.0  PLT 276  --   MCV 91.9  --   MCH 31.0  --   MCHC 33.8  --   RDW 12.5  --   LYMPHSABS 0.6*  --   MONOABS 0.8  --   EOSABS 0.0  --   BASOSABS 0.1  --    ------------------------------------------------------------------------------------------------------------------  Results for orders placed or performed during the hospital encounter of 11/27/19 (from the past 48 hour(s))  Protime-INR     Status: None   Collection Time: 11/27/19  7:39 PM  Result Value Ref Range   Prothrombin Time 12.6 11.4 - 15.2 seconds   INR 1.0 0.8 - 1.2    Comment: (NOTE) INR goal varies based on device and disease states. Performed at [facility] [address], [place] [address]  APTT     Status: Abnormal   Collection Time: 11/27/19  7:39 PM  Result Value Ref Range   aPTT 23 (L) 24 - 36 seconds    Comment: Performed at [place], [place] [id number] CBC     Status: Abnormal   Collection Time: 11/27/19  7:39 PM  Result Value Ref Range   WBC 17.3 (H) 4.0 - 10.5 K/uL   RBC 4.80 3.87 - 5.11 MIL/uL   Hemoglobin 14.9 12.0 - 15.0 g/dL   HCT 44.1 36.0 - 46.0 %   MCV 91.9 80.0 - 100.0 fL   MCH 31.0 26.0 - 34.0 pg   MCHC 33.8 30.0 - 36.0 g/dL   RDW 12.5 11.5 - 15.5 %   Platelets 276 150 - 400 K/uL   nRBC  0.0 0.0 - 0.2 %    Comment:  Performed at [facility], [place] [address], [place], [place] [address]  Differential     Status: Abnormal   Collection Time: 11/27/19  7:39 PM  Result Value Ref Range   Neutrophils Relative % 90 %   Neutro Abs 15.7 (H) 1.7 - 7.7 K/uL   Lymphocytes Relative 4 %   Lymphs Abs 0.6 (L) 0.7 - 4.0 K/uL   Monocytes Relative 5 %   Monocytes Absolute 0.8 0.1 - 1.0 K/uL   Eosinophils Relative 0 %   Eosinophils Absolute 0.0 0.0 - 0.5 K/uL   Basophils Relative 0 %   Basophils Absolute 0.1 0.0 - 0.1 K/uL   Immature Granulocytes 1 %   Abs Immature Granulocytes 0.11 (H) 0.00 - 0.07 K/uL    Comment: Performed at [facility] [address], [place], [place] [address]  Comprehensive metabolic panel     Status: Abnormal   Collection Time: 11/27/19  7:39 PM  Result Value Ref Range   Sodium 140 135 - 145 mmol/L   Potassium 3.5 3.5 - 5.1 mmol/L   Chloride 107 98 - 111 mmol/L   CO2 18 (L) 22 - 32 mmol/L   Glucose, Bld 158 (H) 70 - 99 mg/dL   BUN 12 8 - 23 mg/dL   Creatinine, Ser 0.94 0.44 - 1.00 mg/dL   Calcium 9.5 8.9 - 10.3 mg/dL   Total Protein 6.7 6.5 - 8.1 g/dL   Albumin 4.1 3.5 - 5.0 g/dL   AST 19 15 - 41 U/L   ALT 14 0 - 44 U/L   Alkaline Phosphatase 76 38 - 126 U/L   Total Bilirubin 0.7 0.3 - 1.2 mg/dL   GFR calc non Af Amer >60 >60 mL/min   GFR calc Af Amer >60 >60 mL/min   Anion gap 15 5 - 15    Comment: Performed at [facility] [address] [place], [place] [address]  CBG monitoring, ED     Status: Abnormal   Collection Time: 11/27/19  7:41 PM  Result Value Ref Range   Glucose-Capillary 152 (H) 70 - 99 mg/dL  I-stat chem 8, ED     Status: Abnormal   Collection Time: 11/27/19  7:45 PM  Result Value Ref Range   Sodium 139 135 - 145 mmol/L   Potassium 3.5 3.5 - 5.1 mmol/L   Chloride 104 98 - 111 mmol/L   BUN 13 8 - 23 mg/dL   Creatinine, Ser 0.70 0.44 - 1.00 mg/dL   Glucose, Bld 150 (H) 70 - 99 mg/dL   Calcium, Ion 1.18 1.15 - 1.40 mmol/L   TCO2 22 22 - 32  mmol/L   Hemoglobin 14.6 12.0 - 15.0 g/dL   HCT 43.0 36.0 - 46.0 %  Ethanol     Status: None   Collection Time: 11/27/19  8:16 PM  Result Value Ref Range   Alcohol, Ethyl (B) <10 <10 mg/dL    Comment: (NOTE) Lowest detectable limit for serum alcohol is 10 mg/dL. For medical purposes only. Performed at [facility], [place] [address], [place], [place] [address]   Urine rapid drug screen (hosp performed)     Status: None   Collection Time: 11/27/19  9:39 PM  Result Value Ref Range   Opiates NONE DETECTED NONE DETECTED   Cocaine NONE DETECTED NONE DETECTED   Benzodiazepines NONE DETECTED NONE DETECTED   Amphetamines NONE DETECTED NONE DETECTED  Tetrahydrocannabinol NONE DETECTED NONE DETECTED   Barbiturates NONE DETECTED NONE DETECTED    Comment: (NOTE) DRUG SCREEN FOR MEDICAL PURPOSES ONLY.  IF CONFIRMATION IS NEEDED FOR ANY PURPOSE, NOTIFY LAB WITHIN 5 DAYS. LOWEST DETECTABLE LIMITS FOR URINE DRUG SCREEN Drug Class                     Cutoff (ng/mL) Amphetamine and metabolites    1000 Barbiturate and metabolites    200 Benzodiazepine                 710 Tricyclics and metabolites     300 Opiates and metabolites        300 Cocaine and metabolites        300 THC                            50 Performed at [facility], [place] [address], [place], [place] [address]   Urinalysis, Routine w reflex microscopic     Status: Abnormal   Collection Time: 11/27/19  9:39 PM  Result Value Ref Range   Color, Urine YELLOW YELLOW   APPearance HAZY (A) CLEAR   Specific Gravity, Urine 1.014 1.005 - 1.030   pH 7.0 5.0 - 8.0   Glucose, UA NEGATIVE NEGATIVE mg/dL   Hgb urine dipstick NEGATIVE NEGATIVE   Bilirubin Urine NEGATIVE NEGATIVE   Ketones, ur 5 (A) NEGATIVE mg/dL   Protein, ur NEGATIVE NEGATIVE mg/dL   Nitrite NEGATIVE NEGATIVE   Leukocytes,Ua LARGE (A) NEGATIVE   RBC / HPF 6-10 0 - 5 RBC/hpf   WBC, UA 21-50 0 - 5 WBC/hpf   Bacteria, UA MANY (A) NONE SEEN   Squamous  Epithelial / LPF 0-5 0 - 5    Comment: Performed at [facility] [address], [place], [place] [address]    Chemistries  Recent Labs  Lab 11/27/19 1939 11/27/19 1945  NA 140 139  K 3.5 3.5  CL 107 104  CO2 18*  --   GLUCOSE 158* 150*  BUN 12 13  CREATININE 0.94 0.70  CALCIUM 9.5  --   AST 19  --   ALT 14  --   ALKPHOS 76  --   BILITOT 0.7  --    ------------------------------------------------------------------------------------------------------------------  ------------------------------------------------------------------------------------------------------------------ GFR: CrCl cannot be calculated (Unknown ideal weight.). Liver Function Tests: Recent Labs  Lab 11/27/19 1939  AST 19  ALT 14  ALKPHOS 76  BILITOT 0.7  PROT 6.7  ALBUMIN 4.1   No results for input(s): LIPASE, AMYLASE in the last 168 hours. No results for input(s): AMMONIA in the last 168 hours. Coagulation Profile: Recent Labs  Lab 11/27/19 1939  INR 1.0   Cardiac Enzymes: No results for input(s): CKTOTAL, CKMB, CKMBINDEX, TROPONINI in the last 168 hours. BNP (last 3 results) No results for input(s): PROBNP in the last 8760 hours. HbA1C: No results for input(s): HGBA1C in the last 72 hours. CBG: Recent Labs  Lab 11/27/19 1941  GLUCAP 152*   Lipid Profile: No results for input(s): CHOL, HDL, LDLCALC, TRIG, CHOLHDL, LDLDIRECT in the last 72 hours. Thyroid Function Tests: No results for input(s): TSH, T4TOTAL, FREET4, T3FREE, THYROIDAB in the last 72 hours. Anemia Panel: No results for input(s): VITAMINB12, FOLATE, FERRITIN, TIBC, IRON, RETICCTPCT in the last 72 hours.  --------------------------------------------------------------------------------------------------------------- Urine analysis:    Component Value Date/Time   COLORURINE YELLOW 11/27/2019 2139   APPEARANCEUR HAZY (A) 11/27/2019  2139   LABSPEC 1.014 11/27/2019 2139   PHURINE 7.0 11/27/2019 2139   GLUCOSEU  NEGATIVE 11/27/2019 2139   HGBUR NEGATIVE 11/27/2019 2139   BILIRUBINUR NEGATIVE 11/27/2019 2139   KETONESUR 5 (A) 11/27/2019 2139   PROTEINUR NEGATIVE 11/27/2019 2139   NITRITE NEGATIVE 11/27/2019 2139   LEUKOCYTESUR LARGE (A) 11/27/2019 2139      Imaging Results:    CT HEAD CODE STROKE WO CONTRAST  Result Date: 11/27/2019 CLINICAL DATA:  Altered mental status, slurred speech. Last known normal 16:30 EXAM: CT HEAD WITHOUT CONTRAST TECHNIQUE: Contiguous axial images were obtained from the base of the skull through the vertex without intravenous contrast. COMPARISON:  Brain MRI 10/17/2019 FINDINGS: Brain: Again demonstrated is edema within the right greater than left cerebellar hemispheres. Known metastatic lesions within the right cerebellum and midline cerebellar vermis were better appreciated on brain MRI 10/17/2019. Superimposed acute parenchymal hemorrhage within the right cerebellum. The dominant component of the hemorrhage measures 2.6 x 1.9 x 2.2 cm (series 2, image 7) (series 4, image 45). Posterior fossa mass effect appears similar to prior MRI 10/17/2019 with partial effacement of the fourth ventricle. The lateral and third ventricles are similar in size as compared to prior MRI. There is a small hemorrhagic parenchymal contusion within the inferolateral right temporal lobe with a small amount of adjacent acute subarachnoid hemorrhage (series 4, image 32) (series 2, image 13). No supratentorial midline shift. Unchanged generalized parenchymal atrophy. Vascular: No hyperdense vessel. Skull: Although not definitively identified, a left temporal bone fracture is suspected given the presence of a left mastoid effusion and adjacent subcutaneous emphysema. Sinuses/Orbits: Visualized orbits demonstrate no acute abnormality. Mild mucosal thickening within the right maxillary sinus. Left mastoid effusion. Other: There is subcutaneous gas within the left masticator/parapharyngeal space. Left posterior  scalp hematoma. These results were called by telephone at the time of interpretation on 11/27/2019 at 8:02 pm to provider Dr. Lorraine Lax, who verbally acknowledged these results. IMPRESSION: 1. Prominent edema within the right greater than left cerebellar hemispheres at site of known metastases. Superimposed acute parenchymal hemorrhage within the right cerebellum measuring 2.6 x 1.9 x 2.2 cm. Posterior fossa mass effect is similar to prior MRI with partial effacement of the fourth ventricle. 2. Small hemorrhagic contusion within the inferolateral right temporal lobe with adjacent small volume acute subarachnoid hemorrhage. 3. Although not definitively identified, an acute fracture of the left temporal bone is suspected given the presence of a left mastoid effusion and adjacent soft tissue gas within the left masticator/parapharyngeal space. 4. Left posterior scalp hematoma. Electronically Signed   By: Kellie Simmering DO   On: 11/27/2019 20:19   nsr at 2, nl axis, early R progression, no st-t changes c/w ischemia   Assessment & Plan:    Principal Problem:   ICH (intracerebral hemorrhage) (HCC) Active Problems:   Metastatic lung cancer (metastasis from lung to other site) [facility])   Metastatic cancer to brain [facility])   Acute lower UTI  ICH (hemorrhage brain met in the right cerebellum), trace right SAH, and temporal lobe contusion, possible seizure NPO MRI brani  EEG keppra 1080m iv x1 in ED, keppra  5042miv bid neuroloyg consulted by ED, appreciate input  N/v Check CXR Zofran 55m75mv q6h prn   Acute lower uti Urine culture Rocephin 1gm iv qday  Metastatic lung cancer to brain Decadron 64m72m x1 in ED Will use decadron 55mg 2mq6h  Please contact oncology this am to see if have suggestions regarding treatment family  would like to know what is the next step     DVT Prophylaxis-   SCDs   AM Labs Ordered, also please review Full Orders  Family Communication: Admission, patients condition and  plan of care including tests being ordered have been discussed with the patient  who indicate understanding and agree with the plan and Code Status.  Code Status:  FULL CODE per patient, notified husband that   Admission status: inpatient: Based on patients clinical presentation and evaluation of above clinical data, I have made determination that patient meets Inpatient criteria at this time. Pt has intracranial hemorrhage, into brain met and also possibly trace SAH.  Pt will require > 2 nites stay   Time spent in minutes : 55 minutes   Jani Gravel M.D on 11/27/2019 at 10:17 PM

## 2019-11-27 NOTE — ED Triage Notes (Signed)
Patient has hx of brain tumor; arrived to [place] with stroke symptoms per EMS - aphasia. LSN @ 16:30.

## 2019-11-27 NOTE — ED Provider Notes (Signed)
[facility] EMERGENCY DEPARTMENT Provider Note   CSN: [id number] Arrival date & time: 11/27/19  1938     History Chief Complaint  Patient presents with  . Code Stroke    Hailey Hill is a 69 y.o. female.  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  Husband provides the history but states that she was her normal self at 430 when he left to get something to eat.  He came back to the house around 6 or 630 and he found her sitting in the lawn confused and covered in emesis and stool.  He initially tried to figure out what was going on and then called 911.  Patient has no prior history of seizure or stroke.  She had been feeling well earlier today.  Husband noted confusion and slurred speech when he arrived.  No focal weakness  The history is provided by the patient.       Past Medical History:  Diagnosis Date  . Complication of anesthesia    had complications with "gas buildup" after tubal lig  . Dyslipidemia   . History of radiation therapy 07/16/19, 07/19/19, 07/22/19   SRS radiation to 2 cerebellum targets.   . Lung cancer ([place]) dx'd 07/2010   sm cell   . Peripheral neuropathy   . SVC syndrome   . Thrombus 63/78/5885   Left basilic vein,  Left subclavian vein    Patient Active Problem List   Diagnosis Date Noted  . Fall 07/10/2019  . Closed fracture of head of left humerus   . Metastatic lung cancer (metastasis from lung to other site) ([place]) 07/07/2019  . Fracture, humerus, anatomical neck, left, closed, initial encounter 07/07/2019  . Metastatic cancer to brain ([place]) 07/07/2019  . Chest pain with moderate risk for cardiac etiology 12/14/2015  . Small cell lung cancer ([place]) 09/16/2013    Past Surgical History:  Procedure Laterality Date  . CERVICAL BIOPSY    . CHOLECYSTECTOMY N/A 07/25/2016   Procedure: LAPAROSCOPIC  CHOLECYSTECTOMY;  Surgeon: Coralie Keens, MD;  Location: [facility];  Service: General;  Laterality: N/A;  . TONSILLECTOMY    . TUBAL LIGATION       OB History   No obstetric history on file.     Family History  Problem Relation Age of Onset  . Nephrolithiasis Son   . Heart attack Father   . Heart attack Paternal Uncle   . Heart attack Paternal Grandfather     Social History   Tobacco Use  . Smoking status: Former Smoker    Quit date: 03/08/2010    Years since quitting: 9.7  . Smokeless tobacco: Never Used  Substance Use Topics  . Alcohol use: No  . Drug use: No    Home Medications Prior to Admission medications   Not on File    Allergies    Promethazine hcl  Review of Systems   Review of Systems  All other systems reviewed and are negative.   Physical Exam Updated Vital Signs BP (!) 153/78   Pulse 91   Resp 20   SpO2 100%   Physical Exam Vitals and nursing note reviewed.  Constitutional:      General: She is not in acute distress.    Appearance: She is well-developed and normal weight.     Comments: Dried emesis on the shirt and around  the neck  HENT:     Head: Normocephalic. No raccoon eyes or Battle's sign.     Comments: Hematoma present to the left parietal area    Left Ear: There is impacted cerumen.     Ears:     Comments: No mastoid tenderness or bruising    Nose:     Comments: Dried blood at the right nare    Mouth/Throat:     Mouth: Mucous membranes are moist.  Eyes:     Extraocular Movements: Extraocular movements intact.     Pupils: Pupils are equal, round, and reactive to light.  Cardiovascular:     Rate and Rhythm: Normal rate and regular rhythm.     Heart sounds: Normal heart sounds. No murmur. No friction rub.  Pulmonary:     Effort: Pulmonary effort is normal.     Breath sounds: Normal breath sounds. No wheezing or rales.  Abdominal:     General: Bowel sounds are normal. There is no distension.      Palpations: Abdomen is soft.     Tenderness: There is no abdominal tenderness. There is no guarding or rebound.  Musculoskeletal:        General: No tenderness. Normal range of motion.     Cervical back: Normal range of motion and neck supple. No tenderness.     Right lower leg: No edema.     Left lower leg: No edema.     Comments: No edema.  Moving all ext without pain or concern for injury  Skin:    General: Skin is warm and dry.     Capillary Refill: Capillary refill takes 2 to 3 seconds.     Findings: No rash.     Comments: Ext are cold to touch  Neurological:     Mental Status: She is alert.     Cranial Nerves: No cranial nerve deficit.     Comments: Oriented to person and place.  Mild slurred speech but no specific aphasia.  Moving all extremities and able to follow commands.  No significant ataxia noted on finger-to-nose testing.  Strength equal in all extremities.  Psychiatric:        Behavior: Behavior normal.     ED Results / Procedures / Treatments   Labs (all labs ordered are listed, but only abnormal results are displayed) Labs Reviewed  APTT - Abnormal; Notable for the following components:      Result Value   aPTT 23 (*)    All other components within normal limits  CBC - Abnormal; Notable for the following components:   WBC 17.3 (*)    All other components within normal limits  DIFFERENTIAL - Abnormal; Notable for the following components:   Neutro Abs 15.7 (*)    Lymphs Abs 0.6 (*)    Abs Immature Granulocytes 0.11 (*)    All other components within normal limits  COMPREHENSIVE METABOLIC PANEL - Abnormal; Notable for the following components:   CO2 18 (*)    Glucose, Bld 158 (*)    All other components within normal limits  URINALYSIS, ROUTINE W REFLEX MICROSCOPIC - Abnormal; Notable for the following components:   APPearance HAZY (*)    Ketones, ur 5 (*)    Leukocytes,Ua LARGE (*)    Bacteria, UA MANY (*)    All other components within normal limits   I-STAT CHEM 8, ED - Abnormal; Notable for the following components:   Glucose, Bld 150 (*)    All other components within  normal limits  CBG MONITORING, ED - Abnormal; Notable for the following components:   Glucose-Capillary 152 (*)    All other components within normal limits  RESPIRATORY PANEL BY RT PCR (FLU A&B, COVID)  ETHANOL  PROTIME-INR  RAPID URINE DRUG SCREEN, HOSP PERFORMED  HEMOGLOBIN A1C  LIPID PANEL  COMPREHENSIVE METABOLIC PANEL    EKG None  Radiology CT HEAD CODE STROKE WO CONTRAST  Result Date: 11/27/2019 CLINICAL DATA:  Altered mental status, slurred speech. Last known normal 16:30 EXAM: CT HEAD WITHOUT CONTRAST TECHNIQUE: Contiguous axial images were obtained from the base of the skull through the vertex without intravenous contrast. COMPARISON:  Brain MRI 10/17/2019 FINDINGS: Brain: Again demonstrated is edema within the right greater than left cerebellar hemispheres. Known metastatic lesions within the right cerebellum and midline cerebellar vermis were better appreciated on brain MRI 10/17/2019. Superimposed acute parenchymal hemorrhage within the right cerebellum. The dominant component of the hemorrhage measures 2.6 x 1.9 x 2.2 cm (series 2, image 7) (series 4, image 45). Posterior fossa mass effect appears similar to prior MRI 10/17/2019 with partial effacement of the fourth ventricle. The lateral and third ventricles are similar in size as compared to prior MRI. There is a small hemorrhagic parenchymal contusion within the inferolateral right temporal lobe with a small amount of adjacent acute subarachnoid hemorrhage (series 4, image 32) (series 2, image 13). No supratentorial midline shift. Unchanged generalized parenchymal atrophy. Vascular: No hyperdense vessel. Skull: Although not definitively identified, a left temporal bone fracture is suspected given the presence of a left mastoid effusion and adjacent subcutaneous emphysema. Sinuses/Orbits: Visualized orbits  demonstrate no acute abnormality. Mild mucosal thickening within the right maxillary sinus. Left mastoid effusion. Other: There is subcutaneous gas within the left masticator/parapharyngeal space. Left posterior scalp hematoma. These results were called by telephone at the time of interpretation on 11/27/2019 at 8:02 pm to provider Dr. Lorraine Lax, who verbally acknowledged these results. IMPRESSION: 1. Prominent edema within the right greater than left cerebellar hemispheres at site of known metastases. Superimposed acute parenchymal hemorrhage within the right cerebellum measuring 2.6 x 1.9 x 2.2 cm. Posterior fossa mass effect is similar to prior MRI with partial effacement of the fourth ventricle. 2. Small hemorrhagic contusion within the inferolateral right temporal lobe with adjacent small volume acute subarachnoid hemorrhage. 3. Although not definitively identified, an acute fracture of the left temporal bone is suspected given the presence of a left mastoid effusion and adjacent soft tissue gas within the left masticator/parapharyngeal space. 4. Left posterior scalp hematoma. Electronically Signed   By: Kellie Simmering DO   On: 11/27/2019 20:19    Procedures Procedures (including critical care time)  Medications Ordered in ED Medications  dexamethasone (DECADRON) injection 10 mg (has no administration in time range)  levETIRAcetam (KEPPRA) IVPB 1000 mg/100 mL premix (has no administration in time range)    ED Course  I have reviewed the triage vital signs and the nursing notes.  Pertinent labs & imaging results that were available during my care of the patient were reviewed by me and considered in my medical decision making (see chart for details).    MDM Rules/Calculators/A&P                      69 year old female presenting today as a code stroke due to altered mental status.  Patient with a known mass in the brain most likely from metastatic lung cancer.  Stroke team present on arrival.   Patient went directly  to scanner.  Patient found to have a bleed into the metastatic lesion.  Concerned that patient most likely had a seizure then fell that is what was causing her vomiting earlier.  Patient has no midline shift today but does appear to have a scalp hematoma and small subarachnoid hemorrhage.  Neurology is following but states that she needs Keppra, Decadron, MRI and stepdown admission.  Feel likelihood of worsening bleed is low due to the bleeding within the mass.  Findings discussed with the patient and her husband.  CBC of 17,000 today but could be an acute phase reaction from the recent trauma.  Chem-8 without acute findings.  8:37 PM Head CT shows prominent edema within the right greater than left cerebellar hemisphere with superimposed acute parenchymal hemorrhage within the right cerebellum measuring 2 x 1.9 x 2.2 cm. Also patient has a small hemorrhagic contusion with inferior lateral right temporal lobe and small acute subarachnoid hemorrhage. Also concern for acute fracture of the left temporal bone. On exam patient's left TM is occluded by cerumen unable to visualize if she has hemotympanum. Will discuss with neurosurgery given potential fracture.  10:21 PM Spoke with [facility] and they will consult on the pt.  Pt remains stable with no further sz like activity.  Will admit for further care.  CRITICAL CARE Performed by: Czarina Gingras Total critical care time: 30 minutes Critical care time was exclusive of separately billable procedures and treating other patients. Critical care was necessary to treat or prevent imminent or life-threatening deterioration. Critical care was time spent personally by me on the following activities: development of treatment plan with patient and/or surrogate as well as nursing, discussions with consultants, evaluation of patient's response to treatment, examination of patient, obtaining history from patient or surrogate, ordering and performing  treatments and interventions, ordering and review of laboratory studies, ordering and review of radiographic studies, pulse oximetry and re-evaluation of patient's condition.    Final Clinical Impression(s) / ED Diagnoses Final diagnoses:  Closed fracture of temporal bone, initial encounter ([place])  Subarachnoid hemorrhage ([place])  New onset seizure ([place])  Intracranial hemorrhage, nontraumatic ([place])    Rx / DC Orders ED Discharge Orders    None       Blanchie Dessert, MD 11/27/19 2224

## 2019-11-27 NOTE — ED Notes (Signed)
Tried to give report, RN not available at this time. 

## 2019-11-27 NOTE — Consult Note (Addendum)
Requesting Physician: Dr. Maryan Rued    Chief Complaint: Altered mental status, slurred speech  History obtained from: Patient and Chart  HPI:                                                                                                                                       Hailey Hill is a 69 y.o. female with history of metastatic lung cancer with brain metastatis status post radiation, peripheral neuropathy, SVC syndrome, hyperlipidemia presents to the emergency department for altered mental status and slurred speech.  Last known normal was 4:30 PM witnessed by her husband.  Patient then found her around 7 PM outside standing in the yard standing confused with slurred speech.  EMS was called and patient noted to have slurred speech and was confused.  No other focal deficits noted.  Patient also noted to have defecated on herself and also was vomiting which was blood-tinged.  No tongue bite noted.  Blood pressure was 270 systolic.  Patient was brought as a code stroke.  On arrival, patient alert and oriented x4, mild slurred speech.  Stat CT head was obtained which showed hemorrhage in the right cerebellum, at the site of known metastasis.  Mass-effect similar to prior MRIs.  Also noted to have small hemorrhagic contusion in the inferior right temporal lobe with small volume.   Date last known well: 1.20.21  Time last known well: 4:30 PM tPA Given: No, hemorrhage NIHSS: 1 Baseline MRS 0  Intracerebral Hemorrhage (ICH) Score  Glascow Coma Score 13-15 0  Age >/= 80 yes no 0  ICH volume >/= no 0  IVH yes no 0 Infratentorial origin yes +1Total:  1   Past Medical History:  Diagnosis Date  . Complication of anesthesia    had complications with "gas buildup" after tubal lig  . Dyslipidemia   . History of radiation therapy 07/16/19, 07/19/19, 07/22/19   SRS radiation to 2 cerebellum targets.   . Lung cancer ([place]) dx'd 07/2010   sm cell   . Peripheral neuropathy   . SVC syndrome   .  Thrombus 62/37/6283   Left basilic vein,  Left subclavian vein    Past Surgical History:  Procedure Laterality Date  . CERVICAL BIOPSY    . CHOLECYSTECTOMY N/A 07/25/2016   Procedure: LAPAROSCOPIC CHOLECYSTECTOMY;  Surgeon: Coralie Keens, MD;  Location: [facility];  Service: General;  Laterality: N/A;  . TONSILLECTOMY    . TUBAL LIGATION      Family History  Problem Relation Age of Onset  . Nephrolithiasis Son   . Heart attack Father   . Heart attack Paternal Uncle   . Heart attack Paternal Grandfather    Social History:  reports that she quit smoking about 9 years ago. She has never used smokeless tobacco. She reports that she does not drink alcohol or use drugs.  Allergies:  Allergies  Allergen Reactions  .  Promethazine Hcl Other (See Comments)    Pt reports feeling shaky and "sick" after phenergan    Medications:                                                                                                                        I reviewed home medications   ROS:                                                                                                                                     14 systems reviewed and negative except above    Examination:                                                                                                      General: Appears well-developed  Psych: Affect appropriate to situation Eyes: No scleral injection HENT: No OP obstrucion Head: Normocephalic.  Cardiovascular: Normal rate and regular rhythm. Respiratory: Effort normal and breath sounds normal to anterior ascultation GI: Soft.  No distension. There is no tenderness.  Skin: WDI    Neurological Examination Mental Status: Alert, oriented, thought content appropriate.  Speech fluent without evidence of aphasia.  Mild slurred speech.  Able to follow 3 step commands without difficulty. Cranial Nerves: II: Visual fields grossly normal,  III,IV, VI:  ptosis not present, extra-ocular motions intact bilaterally, pupils equal, round, reactive to light and accommodation V,VII: smile symmetric, facial light touch sensation normal bilaterally VIII: hearing normal bilaterally IX,X: uvula rises symmetrically XI: bilateral shoulder shrug XII: midline tongue extension Motor: Right : Upper extremity   5/5    Left:     Upper extremity   5/5  Lower extremity   5/5     Lower extremity   5/5 Tone and bulk:normal tone throughout; no atrophy noted Sensory: Pinprick and light touch intact throughout, bilaterally Plantars: Right: downgoing   Left: downgoing Cerebellar: normal finger-to-nose, normal rapid alternating movements and normal heel-to-shin test      Lab Results: Basic Metabolic  Panel: Recent Labs  Lab 11/27/19 1939 11/27/19 1945  NA 140 139  K 3.5 3.5  CL 107 104  CO2 18*  --   GLUCOSE 158* 150*  BUN 12 13  CREATININE 0.94 0.70  CALCIUM 9.5  --     CBC: Recent Labs  Lab 11/27/19 1939 11/27/19 1945  WBC 17.3*  --   NEUTROABS 15.7*  --   HGB 14.9 14.6  HCT 44.1 43.0  MCV 91.9  --   PLT 276  --     Coagulation Studies: Recent Labs    11/27/19 1939  LABPROT 12.6  INR 1.0    Imaging: No results found.   ASSESSMENT AND PLAN  69 y.o. female with history of metastatic lung cancer with brain metastatis status post radiation, peripheral neuropathy, SVC syndrome, hyperlipidemia presents to the emergency department for altered mental status and slurred speech.  Patient noted to have hemorrhage at the site of previous metastatic lesion.  Location of hemorrhage should not cause altered mental status, suspect patient likely had seizure.  Also CT head shows area of contusion in the right temporal lobe with trace subarachnoid hemorrhage along with subcutaneous hematoma likely favors the hypothesis along with the fact that she defecated on herself likely favors patient passed out and hit her head.  Patient not on any blood  thinners so no role for reversal.  Platelets 276, INR and APTT normal.  BP in normal range making this less likely to be hypertensive etiology.  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 MRI brain when compared to study performed on 12/10 shows increasing size of cerebellar vermis lesion as well as increased size of anterior right cerebellar lesion with new contrast enhancing lesion indicating worsening intracranial metastatic disease.  Does show severe cerebral edema but no tonsillar or transtentorial herniation.     Burle Kwan [facility] Pager Number [phone]

## 2019-11-28 ENCOUNTER — Inpatient Hospital Stay (HOSPITAL_COMMUNITY): Payer: Medicare HMO

## 2019-11-28 ENCOUNTER — Telehealth: Payer: Self-pay | Admitting: Medical Oncology

## 2019-11-28 DIAGNOSIS — R569 Unspecified convulsions: Secondary | ICD-10-CM

## 2019-11-28 DIAGNOSIS — I614 Nontraumatic intracerebral hemorrhage in cerebellum: Secondary | ICD-10-CM

## 2019-11-28 DIAGNOSIS — C7931 Secondary malignant neoplasm of brain: Principal | ICD-10-CM

## 2019-11-28 LAB — COMPREHENSIVE METABOLIC PANEL
ALT: 13 U/L (ref 0–44)
AST: 18 U/L (ref 15–41)
Albumin: 3.9 g/dL (ref 3.5–5.0)
Alkaline Phosphatase: 79 U/L (ref 38–126)
Anion gap: 13 (ref 5–15)
BUN: 12 mg/dL (ref 8–23)
CO2: 24 mmol/L (ref 22–32)
Calcium: 9.5 mg/dL (ref 8.9–10.3)
Chloride: 102 mmol/L (ref 98–111)
Creatinine, Ser: 0.81 mg/dL (ref 0.44–1.00)
GFR calc Af Amer: >60 mL/min (ref >60)
GFR calc non Af Amer: >60 mL/min (ref >60)
Glucose, Bld: 155 mg/dL — ABNORMAL HIGH (ref 70–99)
Potassium: 3.5 mmol/L (ref 3.5–5.1)
Sodium: 139 mmol/L (ref 135–145)
Total Bilirubin: 0.7 mg/dL (ref 0.3–1.2)
Total Protein: 6.6 g/dL (ref 6.5–8.1)

## 2019-11-28 LAB — LIPID PANEL
Cholesterol: 311 mg/dL — ABNORMAL HIGH (ref 0–200)
HDL: 53 mg/dL (ref >40)
LDL Cholesterol: 241 mg/dL — ABNORMAL HIGH (ref 0–99)
Total CHOL/HDL Ratio: 5.9 RATIO
Triglycerides: 84 mg/dL (ref <150)
VLDL: 17 mg/dL (ref 0–40)

## 2019-11-28 LAB — HEMOGLOBIN A1C
Hgb A1c MFr Bld: 4.8 % (ref 4.8–5.6)
Mean Plasma Glucose: 91.06 mg/dL

## 2019-11-28 MED ORDER — LEVETIRACETAM IN NACL 500 MG/100ML IV SOLN
500.0000 mg | Freq: Two times a day (BID) | INTRAVENOUS | Status: DC
  Administered 2019-11-28: 500 mg via INTRAVENOUS
  Filled 2019-11-28: qty 100

## 2019-11-28 MED ORDER — LEVETIRACETAM 500 MG PO TABS
500.0000 mg | ORAL_TABLET | Freq: Two times a day (BID) | ORAL | Status: DC
  Administered 2019-11-28 – 2019-12-04 (×12): 500 mg via ORAL
  Filled 2019-11-28 (×12): qty 1

## 2019-11-28 MED ORDER — SODIUM CHLORIDE 0.9 % IV SOLN
1.0000 g | INTRAVENOUS | Status: DC
  Administered 2019-11-28 – 2019-11-29 (×2): 1 g via INTRAVENOUS
  Filled 2019-11-28 (×2): qty 10

## 2019-11-28 MED ORDER — SODIUM CHLORIDE 0.9 % IV SOLN
1.0000 g | Freq: Once | INTRAVENOUS | Status: AC
  Administered 2019-11-28: 1 g via INTRAVENOUS
  Filled 2019-11-28: qty 10

## 2019-11-28 MED ORDER — DEXAMETHASONE SODIUM PHOSPHATE 4 MG/ML IJ SOLN
4.0000 mg | Freq: Four times a day (QID) | INTRAMUSCULAR | Status: DC
  Administered 2019-11-28 – 2019-11-29 (×6): 4 mg via INTRAVENOUS
  Filled 2019-11-28 (×6): qty 1

## 2019-11-28 MED ORDER — GADOBUTROL 1 MMOL/ML IV SOLN
7.0000 mL | Freq: Once | INTRAVENOUS | Status: AC | PRN
  Administered 2019-11-28: 7 mL via INTRAVENOUS

## 2019-11-28 MED ORDER — SODIUM CHLORIDE 0.9 % IV SOLN
INTRAVENOUS | Status: DC | PRN
  Administered 2019-11-28 – 2019-11-29 (×2): 250 mL via INTRAVENOUS

## 2019-11-28 MED ORDER — ONDANSETRON HCL 4 MG/2ML IJ SOLN
4.0000 mg | Freq: Once | INTRAMUSCULAR | Status: AC
  Administered 2019-11-28: 4 mg via INTRAVENOUS

## 2019-11-28 NOTE — Plan of Care (Signed)
  Problem: Education: Goal: Knowledge of disease or condition will improve Outcome: Progressing Goal: Knowledge of secondary prevention will improve Outcome: Progressing Goal: Individualized Educational Video(s) Outcome: Progressing   Problem: Coping: Goal: Will verbalize positive feelings about self Outcome: Progressing Goal: Will identify appropriate support needs Outcome: Progressing   Problem: Health Behavior/Discharge Planning: Goal: Ability to manage health-related needs will improve Outcome: Progressing

## 2019-11-28 NOTE — Progress Notes (Signed)
Pt too lethargic to complete admission documentation at this time.

## 2019-11-28 NOTE — Progress Notes (Signed)
SLP Cancellation Note  Patient Details Name: KRISTIEN SALATINO MRN: [id number] DOB: Dec 26, 1950   Cancelled treatment:        Attempted to see pt for speech and swallowing evaluations.  Pt working with another therapy discipline at time of attempt.  SLP will reattempt as schedule permits.   Celedonio Savage, MA, [place] Office: [phone] 11/28/2019, 9:55 AM

## 2019-11-28 NOTE — Procedures (Signed)
Patient Name: Hailey Hill  MRN: [id number]  Epilepsy Attending: Lora Havens  Referring Physician/Provider: Dr. Jani Gravel Date: 11/28/2019 Duration: 23.29 minutes  Patient history: 69 year old female with history of metastatic lung cancer with brain metastatic cysts who presented with Dr. Lennox Solders status and slurred speech.  EEG to evaluate for seizures.  Level of alertness: Awake, asleep  AEDs during EEG study: Keppra  Technical aspects: This EEG study was done with scalp electrodes positioned according to the 10-20 International system of electrode placement. Electrical activity was acquired at a sampling rate of 500Hz  and reviewed with a high frequency filter of 70Hz  and a low frequency filter of 1Hz . EEG data were recorded continuously and digitally stored.   Description: The posterior dominant rhythm consists of 8 Hz activity of moderate voltage (25-35 uV) seen predominantly in posterior head regions, symmetric and reactive to eye opening and eye closing.  Sleep was characterized by vertex waves, sleep spindles (12 to 14 Hz), maximal frontocentral.  EEG showed continuous generalized and maximal right temporal 3 to 5 Hz theta-delta slowing.  Hyperventilation and photic stimulation were not performed.  Abnormality -Continuous slow, generalized, maximal right temporal      IMPRESSION: This study showed evidence of cortical dysfunction in right temporal region likely secondary to underlying hemorrhage as well as mild diffuse encephalopathy, nonspecific etiology. No seizures or epileptiform discharges were seen throughout the recording.  Hailey Hill

## 2019-11-28 NOTE — Consult Note (Signed)
Reason for Consult: Metastatic lung cancer Referring Physician: [facility] hospitalist  Hailey Hill is an 69 y.o. female.  HPI: 69 year old female with known metastatic disease has undergone radiation treatment and stereotactic radiosurgery to multiple brain mets primarily cerebellum.  Presented with altered mental status last night showing small amount of hemorrhage around a known right cerebellar hemisphere lesion.  Vermian lesion stable additional lesions also noted that were not there on MRI scan a month ago.    Past Medical History:  Diagnosis Date  . Complication of anesthesia    had complications with "gas buildup" after tubal lig  . Dyslipidemia   . History of radiation therapy 07/16/19, 07/19/19, 07/22/19   SRS radiation to 2 cerebellum targets.   . 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 Peripheral neuropathy   . SVC syndrome   . Thrombus 26/71/2458   Left basilic vein,  Left subclavian vein    Past Surgical History:  Procedure Laterality Date  . CERVICAL BIOPSY    . CHOLECYSTECTOMY N/A 07/25/2016   Procedure: LAPAROSCOPIC CHOLECYSTECTOMY;  Surgeon: Coralie Keens, MD;  Location: [facility];  Service: General;  Laterality: N/A;  . TONSILLECTOMY    . TUBAL LIGATION      Family History  Problem Relation Age of Onset  . Nephrolithiasis Son   . Heart attack Father   . Heart attack Paternal Uncle   . Heart attack Paternal Grandfather     Social History:  reports that she quit smoking about 9 years ago. She has never used smokeless tobacco. She reports that she does not drink alcohol or use drugs.  Allergies:  Allergies  Allergen Reactions  . Promethazine Hcl Other (See Comments)    Pt reports feeling shaky and "sick" after phenergan    Medications: I have reviewed the patient's current medications.  Results for orders placed or performed during the hospital encounter of 11/27/19 (from the past 48 hour(s))  Protime-INR     Status: None   Collection  Time: 11/27/19  7:39 PM  Result Value Ref Range   Prothrombin Time 12.6 11.4 - 15.2 seconds   INR 1.0 0.8 - 1.2    Comment: (NOTE) INR goal varies based on device and disease states. Performed at [facility], [place] [address] [place], [place] [address]   APTT     Status: Abnormal   Collection Time: 11/27/19  7:39 PM  Result Value Ref Range   aPTT 23 (L) 24 - 36 seconds    Comment: Performed at [facility] [address] [place], [place] [address]  CBC     Status: Abnormal   Collection Time: 11/27/19  7:39 PM  Result Value Ref Range   WBC 17.3 (H) 4.0 - 10.5 K/uL   RBC 4.80 3.87 - 5.11 MIL/uL   Hemoglobin 14.9 12.0 - 15.0 g/dL   HCT 44.1 36.0 - 46.0 %   MCV 91.9 80.0 - 100.0 fL   MCH 31.0 26.0 - 34.0 pg   MCHC 33.8 30.0 - 36.0 g/dL   RDW 12.5 11.5 - 15.5 %   Platelets 276 150 - 400 K/uL   nRBC 0.0 0.0 - 0.2 %    Comment: Performed at [facility], [place] [address], [place], [place] [address]  Differential     Status: Abnormal   Collection Time: 11/27/19  7:39 PM  Result Value Ref Range   Neutrophils Relative % 90 %   Neutro Abs  15.7 (H) 1.7 - 7.7 K/uL   Lymphocytes Relative 4 %   Lymphs Abs 0.6 (L) 0.7 - 4.0 K/uL   Monocytes Relative 5 %   Monocytes Absolute 0.8 0.1 - 1.0 K/uL   Eosinophils Relative 0 %   Eosinophils Absolute 0.0 0.0 - 0.5 K/uL   Basophils Relative 0 %   Basophils Absolute 0.1 0.0 - 0.1 K/uL   Immature Granulocytes 1 %   Abs Immature Granulocytes 0.11 (H) 0.00 - 0.07 K/uL    Comment: Performed at [facility] [address], [place], [place] [address]  Comprehensive metabolic panel     Status: Abnormal   Collection Time: 11/27/19  7:39 PM  Result Value Ref Range   Sodium 140 135 - 145 mmol/L   Potassium 3.5 3.5 - 5.1 mmol/L   Chloride 107 98 - 111 mmol/L   CO2 18 (L) 22 - 32 mmol/L   Glucose, Bld 158 (H) 70 - 99 mg/dL   BUN 12 8 - 23 mg/dL   Creatinine, Ser 0.94 0.44 - 1.00 mg/dL   Calcium 9.5 8.9 - 10.3 mg/dL   Total  Protein 6.7 6.5 - 8.1 g/dL   Albumin 4.1 3.5 - 5.0 g/dL   AST 19 15 - 41 U/L   ALT 14 0 - 44 U/L   Alkaline Phosphatase 76 38 - 126 U/L   Total Bilirubin 0.7 0.3 - 1.2 mg/dL   GFR calc non Af Amer >60 >60 mL/min   GFR calc Af Amer >60 >60 mL/min   Anion gap 15 5 - 15    Comment: Performed at [facility] [address], [place], [place] [address]  CBG monitoring, ED     Status: Abnormal   Collection Time: 11/27/19  7:41 PM  Result Value Ref Range   Glucose-Capillary 152 (H) 70 - 99 mg/dL  I-stat chem 8, ED     Status: Abnormal   Collection Time: 11/27/19  7:45 PM  Result Value Ref Range   Sodium 139 135 - 145 mmol/L   Potassium 3.5 3.5 - 5.1 mmol/L   Chloride 104 98 - 111 mmol/L   BUN 13 8 - 23 mg/dL   Creatinine, Ser 0.70 0.44 - 1.00 mg/dL   Glucose, Bld 150 (H) 70 - 99 mg/dL   Calcium, Ion 1.18 1.15 - 1.40 mmol/L   TCO2 22 22 - 32 mmol/L   Hemoglobin 14.6 12.0 - 15.0 g/dL   HCT 43.0 36.0 - 46.0 %  Ethanol     Status: None   Collection Time: 11/27/19  8:16 PM  Result Value Ref Range   Alcohol, Ethyl (B) <10 <10 mg/dL    Comment: (NOTE) Lowest detectable limit for serum alcohol is 10 mg/dL. For medical purposes only. Performed at [facility], [place] [address] [place], [place] [address]   Respiratory Panel by RT PCR (Flu A&B, Covid) - Nasopharyngeal Swab     Status: None   Collection Time: 11/27/19  8:28 PM   Specimen: Nasopharyngeal Swab  Result Value Ref Range   SARS Coronavirus 2 by RT PCR NEGATIVE NEGATIVE    Comment: (NOTE) SARS-CoV-2 target nucleic acids are NOT DETECTED. The SARS-CoV-2 RNA is generally detectable in upper respiratoy specimens during the acute phase of infection. The lowest concentration of SARS-CoV-2 viral copies this assay can detect is 131 copies/mL. A negative result does not preclude SARS-Cov-2 infection and should not be used as the sole basis for treatment or other patient management  decisions. A negative result may occur with   improper specimen collection/handling, submission of specimen other than nasopharyngeal swab, presence of viral mutation(s) within the areas targeted by this assay, and inadequate number of viral copies (<131 copies/mL). A negative result must be combined with clinical observations, patient history, and epidemiological information. The expected result is Negative. Fact Sheet for Patients:  PinkCheek.be Fact Sheet for Healthcare Providers:  GravelBags.it This test is not yet ap proved or cleared by the [place] FDA and  has been authorized for detection and/or diagnosis of SARS-CoV-2 by FDA under an Emergency Use Authorization (EUA). This EUA will remain  in effect (meaning this test can be used) for the duration of the COVID-19 declaration under Section 564(b)(1) of the Act, 21 U.S.C. section 360bbb-3(b)(1), unless the authorization is terminated or revoked sooner.    Influenza A by PCR NEGATIVE NEGATIVE   Influenza B by PCR NEGATIVE NEGATIVE    Comment: (NOTE) The Xpert Xpress SARS-CoV-2/FLU/RSV assay is intended as an aid in  the diagnosis of influenza from Nasopharyngeal swab specimens and  should not be used as a sole basis for treatment. Nasal washings and  aspirates are unacceptable for Xpert Xpress SARS-CoV-2/FLU/RSV  testing. Fact Sheet for Patients: PinkCheek.be Fact Sheet for Healthcare Providers: GravelBags.it This test is not yet approved or cleared by the [place] FDA and  has been authorized for detection and/or diagnosis of SARS-CoV-2 by  FDA under an Emergency Use Authorization (EUA). This EUA will remain  in effect (meaning this test can be used) for the duration of the  Covid-19 declaration under Section 564(b)(1) of the Act, 21  U.S.C. section 360bbb-3(b)(1), unless the authorization is  terminated or revoked. Performed at [facility], [place] [address], [place], [place] [address]   Urine rapid drug screen (hosp performed)     Status: None   Collection Time: 11/27/19  9:39 PM  Result Value Ref Range   Opiates NONE DETECTED NONE DETECTED   Cocaine NONE DETECTED NONE DETECTED   Benzodiazepines NONE DETECTED NONE DETECTED   Amphetamines NONE DETECTED NONE DETECTED   Tetrahydrocannabinol NONE DETECTED NONE DETECTED   Barbiturates NONE DETECTED NONE DETECTED    Comment: (NOTE) DRUG SCREEN FOR MEDICAL PURPOSES ONLY.  IF CONFIRMATION IS NEEDED FOR ANY PURPOSE, NOTIFY LAB WITHIN 5 DAYS. LOWEST DETECTABLE LIMITS FOR URINE DRUG SCREEN Drug Class                     Cutoff (ng/mL) Amphetamine and metabolites    1000 Barbiturate and metabolites    200 Benzodiazepine                 151 Tricyclics and metabolites     300 Opiates and metabolites        300 Cocaine and metabolites        300 THC                            50 Performed at [facility], [place] [address] [place], [place] [address]   Urinalysis, Routine w reflex microscopic     Status: Abnormal   Collection Time: 11/27/19  9:39 PM  Result Value Ref Range   Color, Urine YELLOW YELLOW   APPearance HAZY (A) CLEAR   Specific Gravity, Urine 1.014 1.005 - 1.030   pH 7.0 5.0 - 8.0   Glucose, UA NEGATIVE NEGATIVE mg/dL   Hgb urine dipstick  NEGATIVE NEGATIVE   Bilirubin Urine NEGATIVE NEGATIVE   Ketones, ur 5 (A) NEGATIVE mg/dL   Protein, ur NEGATIVE NEGATIVE mg/dL   Nitrite NEGATIVE NEGATIVE   Leukocytes,Ua LARGE (A) NEGATIVE   RBC / HPF 6-10 0 - 5 RBC/hpf   WBC, UA 21-50 0 - 5 WBC/hpf   Bacteria, UA MANY (A) NONE SEEN   Squamous Epithelial / LPF 0-5 0 - 5    Comment: Performed at [facility], [place] [address], [place], [place] [address]  Hemoglobin A1c     Status: None   Collection Time: 11/28/19  4:35 AM  Result Value Ref Range   Hgb A1c MFr Bld 4.8 4.8 - 5.6 %    Comment: (NOTE) Pre diabetes:          5.7%-6.4% Diabetes:               >6.4% Glycemic control for   <7.0% adults with diabetes    Mean Plasma Glucose 91.06 mg/dL    Comment: Performed at [facility] [address], [place], [place] [address]  Comprehensive metabolic panel     Status: Abnormal   Collection Time: 11/28/19  4:35 AM  Result Value Ref Range   Sodium 139 135 - 145 mmol/L   Potassium 3.5 3.5 - 5.1 mmol/L   Chloride 102 98 - 111 mmol/L   CO2 24 22 - 32 mmol/L   Glucose, Bld 155 (H) 70 - 99 mg/dL   BUN 12 8 - 23 mg/dL   Creatinine, Ser 0.81 0.44 - 1.00 mg/dL   Calcium 9.5 8.9 - 10.3 mg/dL   Total Protein 6.6 6.5 - 8.1 g/dL   Albumin 3.9 3.5 - 5.0 g/dL   AST 18 15 - 41 U/L   ALT 13 0 - 44 U/L   Alkaline Phosphatase 79 38 - 126 U/L   Total Bilirubin 0.7 0.3 - 1.2 mg/dL   GFR calc non Af Amer >60 >60 mL/min   GFR calc Af Amer >60 >60 mL/min   Anion gap 13 5 - 15    Comment: Performed at [facility], [place] [address] [place], [place] [address]  Lipid panel     Status: Abnormal   Collection Time: 11/28/19  4:35 AM  Result Value Ref Range   Cholesterol 311 (H) 0 - 200 mg/dL   Triglycerides 84 <150 mg/dL   HDL 53 >40 mg/dL   Total CHOL/HDL Ratio 5.9 RATIO   VLDL 17 0 - 40 mg/dL   LDL Cholesterol 241 (H) 0 - 99 mg/dL    Comment:        Total Cholesterol/HDL:CHD Risk Coronary Heart Disease Risk Table                     Men   Women  1/2 Average Risk   3.4   3.3  Average Risk       5.0   4.4  2 X Average Risk   9.6   7.1  3 X Average Risk  23.4   11.0        Use the calculated Patient Ratio above and the CHD Risk Table to determine the patient's CHD Risk.        ATP III CLASSIFICATION (LDL):  <100     mg/dL   Optimal  100-129  mg/dL   Near or Above  Optimal  130-159  mg/dL   Borderline  160-189  mg/dL   High  >190     mg/dL   Very High Performed at [facility] [address], [place], [place] [address]     MR Brain W and Wo Contrast  Result Date: 11/28/2019 CLINICAL DATA:  Seizure and  intracranial metastatic disease. EXAM: MRI HEAD WITHOUT AND WITH CONTRAST TECHNIQUE: Multiplanar, multiecho pulse sequences of the brain and surrounding structures were obtained without and with intravenous contrast. CONTRAST:  56mL GADAVIST GADOBUTROL 1 MMOL/ML IV SOLN COMPARISON:  10/17/2019 brain MRI Head CT 11/27/2019 FINDINGS: BRAIN: Intraparenchymal hematomas within the right cerebellum and right temporal lobe unchanged. There is severe cerebellar edema but no transtentorial herniation. Small amount of subarachnoid blood over the right temporal lobe is unchanged. The white matter signal is normal for the patient's age. No hydrocephalus. There is heterogeneous contrast enhancement associated with the right cerebellar hemorrhage. Inferior vermis lesion has increased in size and now measures 3.3 cm. A smaller lesion at the dorsal aspect of the cerebral aqua duct is decreased in size and now measures 1.2 cm. Anterior right cerebellar lesion is new or increased in size, measuring 8 mm (series 22, image 15). VASCULAR: The major intracranial arterial and venous sinus flow voids are normal. Susceptibility-sensitive sequences show no chronic microhemorrhage or superficial siderosis. SKULL AND UPPER CERVICAL SPINE: Left parietal scalp subgaleal hematoma. No focal calvarial lesions. SINUSES/ORBITS: Bilateral mastoid fluid. The orbits are normal. IMPRESSION: 1. Unchanged size of intraparenchymal hematomas within the right cerebellum and right temporal lobe. 2. Severe cerebellar edema without transtentorial or tonsillar herniation. 3. New contrast-enhancing lesions within the right cerebellar hemisphere, consistent with worsening intracranial metastatic disease. No supratentorial metastatic disease. Electronically Signed   By: Ulyses Jarred M.D.   On: 11/28/2019 03:50   DG CHEST PORT 1 VIEW  Result Date: 11/28/2019 CLINICAL DATA:  Nausea and vomiting.  History of stroke. EXAM: PORTABLE CHEST 1 VIEW COMPARISON:  CT  10/29/2019.  Chest x-ray 07/07/2019, 12/14/2015. FINDINGS: Mediastinum appears stable. Heart size stable. Stable changes of pleural-parenchymal scarring noted over the left suprahilar region consistent with prior radiation therapy. Mild bibasilar atelectasis. No prominent pleural effusion. No pneumothorax. Surgical clips left chest. Thoracic spine scoliosis. IMPRESSION: 1. Stable changes of pleural-parenchymal scarring noted over the left suprahilar region consistent prior radiation therapy. 2.  Mild bibasilar atelectasis. Electronically Signed   By: Marcello Moores  Register   On: 11/28/2019 07:25   CT HEAD CODE STROKE WO CONTRAST  Result Date: 11/27/2019 CLINICAL DATA:  Altered mental status, slurred speech. Last known normal 16:30 EXAM: CT HEAD WITHOUT CONTRAST TECHNIQUE: Contiguous axial images were obtained from the base of the skull through the vertex without intravenous contrast. COMPARISON:  Brain MRI 10/17/2019 FINDINGS: Brain: Again demonstrated is edema within the right greater than left cerebellar hemispheres. Known metastatic lesions within the right cerebellum and midline cerebellar vermis were better appreciated on brain MRI 10/17/2019. Superimposed acute parenchymal hemorrhage within the right cerebellum. The dominant component of the hemorrhage measures 2.6 x 1.9 x 2.2 cm (series 2, image 7) (series 4, image 45). Posterior fossa mass effect appears similar to prior MRI 10/17/2019 with partial effacement of the fourth ventricle. The lateral and third ventricles are similar in size as compared to prior MRI. There is a small hemorrhagic parenchymal contusion within the inferolateral right temporal lobe with a small amount of adjacent acute subarachnoid hemorrhage (series 4, image 32) (series 2, image 13). No supratentorial midline shift. Unchanged  generalized parenchymal atrophy. Vascular: No hyperdense vessel. Skull: Although not definitively identified, a left temporal bone fracture is suspected given the  presence of a left mastoid effusion and adjacent subcutaneous emphysema. Sinuses/Orbits: Visualized orbits demonstrate no acute abnormality. Mild mucosal thickening within the right maxillary sinus. Left mastoid effusion. Other: There is subcutaneous gas within the left masticator/parapharyngeal space. Left posterior scalp hematoma. These results were called by telephone at the time of interpretation on 11/27/2019 at 8:02 pm to provider Dr. Lorraine Lax, who verbally acknowledged these results. IMPRESSION: 1. Prominent edema within the right greater than left cerebellar hemispheres at site of known metastases. Superimposed acute parenchymal hemorrhage within the right cerebellum measuring 2.6 x 1.9 x 2.2 cm. Posterior fossa mass effect is similar to prior MRI with partial effacement of the fourth ventricle. 2. Small hemorrhagic contusion within the inferolateral right temporal lobe with adjacent small volume acute subarachnoid hemorrhage. 3. Although not definitively identified, an acute fracture of the left temporal bone is suspected given the presence of a left mastoid effusion and adjacent soft tissue gas within the left masticator/parapharyngeal space. 4. Left posterior scalp hematoma. Electronically Signed   By: Kellie Simmering DO   On: 11/27/2019 20:19    Review of Systems  Unable to perform ROS: Mental status change   Blood pressure 131/62, pulse 87, temperature 97.9 F (36.6 C), temperature source Oral, resp. rate 18, SpO2 98 %. Physical Exam  Constitutional: She appears lethargic.  Neurological: She appears lethargic. GCS eye subscore is 4. GCS verbal subscore is 5. GCS motor subscore is 6.  Lethargic and somnolent however opens eyes to stimulation will vocalize but is not awake enough to be tested in orientation.  Moves all extremities with equal strength.  Pulls are equal and reactive    Assessment/Plan: 69 year old female whose failing treatment for metastatic lung cancer.  I do not see a role for  neurosurgery unfortunately in this situation as patient has multiple brain mets multiple cerebellar mets small amount of hemorrhage that currently is causing minimal to no mass-effect.  Recommend continue supportive care  Hailey Hill 11/28/2019, 7:49 AM

## 2019-11-28 NOTE — Progress Notes (Signed)
Rehab Admissions Coordinator Note:  Per PT and OT recommendation, this patient was screened by Raechel Ache for appropriateness for an Inpatient Acute Rehab Consult.  At this time, we are recommending an Inpatient Rehab consult.   AC will place consult order in the chart to allow for further follow up and assessment of pt's candidacy for IP Rehab.   Raechel Ache 11/28/2019, 2:08 PM  I can be reached at [phone].

## 2019-11-28 NOTE — H&P (Signed)
[facility] Consult Note  Patient Care Team: [name], NP as PCP - General  CHIEF COMPLAINTS/PURPOSE OF CONSULTATION:  Brain Metastases, Altered Mental Status  HISTORY OF PRESENTING ILLNESS:  [place] 69 y.o. female presented with sudden change in mental status at 6pm yesterday.  She was found down by her husband, minimally responsive, after having been at baseline 2 hours prior.  She was brought to the ED where CNS imaging demonstrated hemorrhage in the cerebellum.  At present she has difficulty communicating details of history because of poor mentation.  Currently on standing decadron and supportive care after no neurosurgical intervention was offered.   MEDICAL HISTORY:  Past Medical History:  Diagnosis Date  . Complication of anesthesia    had complications with "gas buildup" after tubal lig  . Dyslipidemia   . History of radiation therapy 07/16/19, 07/19/19, 07/22/19   SRS radiation to 2 cerebellum targets.   . Lung cancer ([place]) dx'd 07/2010   sm cell   . Peripheral neuropathy   . SVC syndrome   . Thrombus 37/62/8315   Left basilic vein,  Left subclavian vein    SURGICAL HISTORY: Past Surgical History:  Procedure Laterality Date  . CERVICAL BIOPSY    . CHOLECYSTECTOMY N/A 07/25/2016   Procedure: LAPAROSCOPIC CHOLECYSTECTOMY;  Surgeon: Coralie Keens, MD;  Location: [place] [facility];  Service: General;  Laterality: N/A;  . TONSILLECTOMY    . TUBAL LIGATION      SOCIAL HISTORY: Social History   Socioeconomic History  . Marital status: Married    Spouse name: Not on file  . Number of children: Not on file  . Years of education: Not on file  . Highest education level: Not on file  Occupational History  . Not on file  Tobacco Use  . Smoking status: Former Smoker    Quit date: 03/08/2010    Years since quitting: 9.7  . Smokeless tobacco: Never Used  Substance and Sexual Activity  . Alcohol use: No  . Drug use: No   . Sexual activity: Never    Birth control/protection: Surgical  Other Topics Concern  . Not on file  Social History Narrative  . Not on file   Social Determinants of Health   Financial Resource Strain:   . Difficulty of Paying Living Expenses: Not on file  Food Insecurity:   . Worried About Charity fundraiser in the Last Year: Not on file  . Ran Out of Food in the Last Year: Not on file  Transportation Needs: No Transportation Needs  . Lack of Transportation (Medical): No  . Lack of Transportation (Non-Medical): No  Physical Activity:   . Days of Exercise per Week: Not on file  . Minutes of Exercise per Session: Not on file  Stress:   . Feeling of Stress : Not on file  Social Connections:   . Frequency of Communication with Friends and Family: Not on file  . Frequency of Social Gatherings with Friends and Family: Not on file  . Attends Religious Services: Not on file  . Active Member of Clubs or Organizations: Not on file  . Attends Archivist Meetings: Not on file  . Marital Status: Not on file  Intimate Partner Violence: Not At Risk  . Fear of Current or Ex-Partner: No  . Emotionally Abused: No  . Physically Abused: No  . Sexually Abused: No    FAMILY HISTORY: Family History  Problem Relation Age of Onset  .  Nephrolithiasis Son   . Heart attack Father   . Heart attack Paternal Uncle   . Heart attack Paternal Grandfather     ALLERGIES:  is allergic to promethazine hcl.  MEDICATIONS:  Current Facility-Administered Medications  Medication Dose Route Frequency Provider Last Rate Last Admin  . acetaminophen (TYLENOL) tablet 650 mg  650 mg Oral Q4H PRN Jani Gravel, MD       Or  . acetaminophen (TYLENOL) 160 MG/5ML solution 650 mg  650 mg Per Tube Q4H PRN Jani Gravel, MD       Or  . acetaminophen (TYLENOL) suppository 650 mg  650 mg Rectal Q4H PRN Jani Gravel, MD      . cefTRIAXone (ROCEPHIN) 1 g in sodium chloride 0.9 % 100 mL IVPB  1 g Intravenous Q24H  Jani Gravel, MD      . dexamethasone (DECADRON) injection 4 mg  4 mg Intravenous Q6H Jani Gravel, MD   4 mg at 11/28/19 1255  . hydrALAZINE (APRESOLINE) injection 5 mg  5 mg Intravenous Q6H PRN Jani Gravel, MD   5 mg at 11/27/19 2359  . levETIRAcetam (KEPPRA) tablet 500 mg  500 mg Oral BID Harold Hedge, MD      . ondansetron [facility]) injection 4 mg  4 mg Intravenous Q6H PRN Jani Gravel, MD   4 mg at 11/27/19 2334    REVIEW OF SYSTEMS:   Limited by altered mental status   PHYSICAL EXAMINATION: Vitals:   11/28/19 0200 11/28/19 0403  BP: (!) 145/72 131/62  Pulse: 95 87  Resp: 18 18  Temp: 97.8 F (36.6 C) 97.9 F (36.6 C)  SpO2: 99% 98%   KPS: 50. General: Drowsy Head: Normal EENT: No conjunctival injection or scleral icterus. Oral mucosa moist Lungs: Resp effort normal Cardiac: Regular rate and rhythm Abdomen: Soft, non-distended abdomen Skin: No rashes cyanosis or petechiae. Extremities: No clubbing or edema  NEUROLOGIC EXAM: Mental Status: Eyes closed, but awake and responsive to loud verbal stimuli.  Not alert or attentive without ongoing stimulus.  Can tell me her name and follow simple commands.  Further testing of language and cognition limited by arousal level. Cranial Nerves: Extra-ocular movements intact grossly. No ptosis. Face is symmetric. Motor: Tone and bulk are normal. Power is antigravity both arms and legs. Reflexes are symmetric, no pathologic reflexes present. Dense dysmetria with coordinated movements of right arm Sensory: Grossly intact Gait: non ambulatory   LABORATORY DATA:  I have reviewed the data as listed Lab Results  Component Value Date   WBC 17.3 (H) 11/27/2019   HGB 14.6 11/27/2019   HCT 43.0 11/27/2019   MCV 91.9 11/27/2019   PLT 276 11/27/2019   Recent Labs    10/29/19 0949 10/29/19 0949 11/27/19 1939 11/27/19 1945 11/28/19 0435  NA 143   < > 140 139 139  K 4.3   < > 3.5 3.5 3.5  CL 106   < > 107 104 102  CO2 26  --  18*  --  24   GLUCOSE 94   < > 158* 150* 155*  BUN 9   < > 12 13 12   CREATININE 0.77  --  0.94 0.70 0.81  CALCIUM 9.1  --  9.5  --  9.5  GFRNONAA >60  --  >60  --  >60  GFRAA >60  --  >60  --  >60  PROT 6.4*  --  6.7  --  6.6  ALBUMIN 3.8  --  4.1  --  3.9  AST 11*  --  19  --  18  ALT 8  --  14  --  13  ALKPHOS 82  --  76  --  79  BILITOT 0.4  --  0.7  --  0.7   < > = values in this interval not displayed.    RADIOGRAPHIC STUDIES: I have personally reviewed the radiological images as listed and agreed with the findings in the report. EEG  Result Date: 11/28/2019 Lora Havens, MD     11/28/2019  9:51 AM Patient Name: KIEU QUIGGLE MRN: [id number] Epilepsy Attending: Lora Havens Referring Physician/Provider: Dr. 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 Lennox Solders status and slurred speech.  EEG to evaluate for seizures. Level of alertness: Awake, asleep AEDs during EEG study: Keppra Technical aspects: This EEG study was done with scalp electrodes positioned according to the 10-20 International system of electrode placement. Electrical activity was acquired at a sampling rate of 500Hz  and reviewed with a high frequency filter of 70Hz  and a low frequency filter of 1Hz . EEG data were recorded continuously and digitally stored. Description: The posterior dominant rhythm consists of 8 Hz activity of moderate voltage (25-35 uV) seen predominantly in posterior head regions, symmetric and reactive to eye opening and eye closing.  Sleep was characterized by vertex waves, sleep spindles (12 to 14 Hz), maximal frontocentral.  EEG showed continuous generalized and maximal right temporal 3 to 5 Hz theta-delta slowing.  Hyperventilation and photic stimulation were not performed. Abnormality -Continuous slow, generalized, maximal right temporal    IMPRESSION: This study showed evidence  of cortical dysfunction in right temporal region likely secondary to underlying hemorrhage as well as mild diffuse encephalopathy, nonspecific etiology. No seizures or epileptiform discharges were seen throughout the recording. Lora Havens   MR Brain W and Wo Contrast  Result Date: 11/28/2019 CLINICAL DATA:  Seizure and intracranial metastatic disease. EXAM: MRI HEAD WITHOUT AND WITH CONTRAST TECHNIQUE: Multiplanar, multiecho pulse sequences of the brain and surrounding structures were obtained without and with intravenous contrast. CONTRAST:  107mL GADAVIST GADOBUTROL 1 MMOL/ML IV SOLN COMPARISON:  10/17/2019 brain MRI Head CT 11/27/2019 FINDINGS: BRAIN: Intraparenchymal hematomas within the right cerebellum and right temporal lobe unchanged. There is severe cerebellar edema but no transtentorial herniation. Small amount of subarachnoid blood over the right temporal lobe is unchanged. The white matter signal is normal for the patient's age. No hydrocephalus. There is heterogeneous contrast enhancement associated with the right cerebellar hemorrhage. Inferior vermis lesion has increased in size and now measures 3.3 cm. A smaller lesion at the dorsal aspect of the cerebral aqua duct is decreased in size and now measures 1.2 cm. Anterior right cerebellar lesion is new or increased in size, measuring 8 mm (series 22, image 15). VASCULAR: The major intracranial arterial and venous sinus flow voids are normal. Susceptibility-sensitive sequences show no chronic microhemorrhage or superficial siderosis. SKULL AND UPPER CERVICAL SPINE: Left parietal scalp subgaleal hematoma. No focal calvarial lesions. SINUSES/ORBITS: Bilateral mastoid fluid. The orbits are normal. IMPRESSION: 1. Unchanged size of intraparenchymal hematomas within the right cerebellum and right temporal lobe. 2. Severe cerebellar edema without transtentorial or tonsillar herniation. 3. New contrast-enhancing lesions within the right cerebellar  hemisphere, consistent with worsening intracranial metastatic disease. No supratentorial metastatic disease. Electronically Signed   By: Ulyses Jarred M.D.   On: 11/28/2019 03:50   DG  CHEST PORT 1 VIEW  Result Date: 11/28/2019 CLINICAL DATA:  Nausea and vomiting.  History of stroke. EXAM: PORTABLE CHEST 1 VIEW COMPARISON:  CT 10/29/2019.  Chest x-ray 07/07/2019, 12/14/2015. FINDINGS: Mediastinum appears stable. Heart size stable. Stable changes of pleural-parenchymal scarring noted over the left suprahilar region consistent with prior radiation therapy. Mild bibasilar atelectasis. No prominent pleural effusion. No pneumothorax. Surgical clips left chest. Thoracic spine scoliosis. IMPRESSION: 1. Stable changes of pleural-parenchymal scarring noted over the left suprahilar region consistent prior radiation therapy. 2.  Mild bibasilar atelectasis. Electronically Signed   By: Marcello Moores  Register   On: 11/28/2019 07:25   CT HEAD CODE STROKE WO CONTRAST  Result Date: 11/27/2019 CLINICAL DATA:  Altered mental status, slurred speech. Last known normal 16:30 EXAM: CT HEAD WITHOUT CONTRAST TECHNIQUE: Contiguous axial images were obtained from the base of the skull through the vertex without intravenous contrast. COMPARISON:  Brain MRI 10/17/2019 FINDINGS: Brain: Again demonstrated is edema within the right greater than left cerebellar hemispheres. Known metastatic lesions within the right cerebellum and midline cerebellar vermis were better appreciated on brain MRI 10/17/2019. Superimposed acute parenchymal hemorrhage within the right cerebellum. The dominant component of the hemorrhage measures 2.6 x 1.9 x 2.2 cm (series 2, image 7) (series 4, image 45). Posterior fossa mass effect appears similar to prior MRI 10/17/2019 with partial effacement of the fourth ventricle. The lateral and third ventricles are similar in size as compared to prior MRI. There is a small hemorrhagic parenchymal contusion within the  inferolateral right temporal lobe with a small amount of adjacent acute subarachnoid hemorrhage (series 4, image 32) (series 2, image 13). No supratentorial midline shift. Unchanged generalized parenchymal atrophy. Vascular: No hyperdense vessel. Skull: Although not definitively identified, a left temporal bone fracture is suspected given the presence of a left mastoid effusion and adjacent subcutaneous emphysema. Sinuses/Orbits: Visualized orbits demonstrate no acute abnormality. Mild mucosal thickening within the right maxillary sinus. Left mastoid effusion. Other: There is subcutaneous gas within the left masticator/parapharyngeal space. Left posterior scalp hematoma. These results were called by telephone at the time of interpretation on 11/27/2019 at 8:02 pm to provider Dr. Lorraine Lax, who verbally acknowledged these results. IMPRESSION: 1. Prominent edema within the right greater than left cerebellar hemispheres at site of known metastases. Superimposed acute parenchymal hemorrhage within the right cerebellum measuring 2.6 x 1.9 x 2.2 cm. Posterior fossa mass effect is similar to prior MRI with partial effacement of the fourth ventricle. 2. Small hemorrhagic contusion within the inferolateral right temporal lobe with adjacent small volume acute subarachnoid hemorrhage. 3. Although not definitively identified, an acute fracture of the left temporal bone is suspected given the presence of a left mastoid effusion and adjacent soft tissue gas within the left masticator/parapharyngeal space. 4. Left posterior scalp hematoma. Electronically Signed   By: Kellie Simmering DO   On: 11/27/2019 20:19    ASSESSMENT & PLAN:  Brain Metastases Altered Mental Status  Ms. 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  Two lesions lateralized to the right cerebellar hemisphere are responsible for hemorrhage and subsequent edema.   In addition, progression of recently treated (Sept 2020) large midline vermis lesion is contributory.  The etiology of this progression is likely radio-inflammatory change, though organic tumor progression cannot be ruled out.   Novel lesions are best suited for radiosurgery once her clinical condition stabilizes.  Will discuss timing of treatment with Dr. Isidore Moos, as well as  possible utility of repeat MRI brain to characterize lesions without extent of blood products.  At this time, recommend continued dexamethasone (transition to 4mg  BID once arousal level improves, then 4mg  daily after 5 days of BID therapy).  Otherwise, supportive care and physical rehabilitation are priorities.  Will continue to follow along and case will be further discussed in brain/spine tumor board meeting.  All questions were answered. The patient knows to call the clinic with any problems, questions or concerns.  The total time spent in the encounter was 55 minutes and more than 50% was on counseling and review of test results     Ventura Sellers, MD 11/28/2019 4:01 PM

## 2019-11-28 NOTE — Progress Notes (Signed)
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 Earlie Server, oncology, who presented with complaints of nausea/vomiting found down by her husband in yard with last known normal 4:30 PM 1/20.  Since being admitted neurology, neurosurgery and neuro-oncology have been consulted and she has had an EEG done.  Has been getting steroids.  Passed the swallow study.  Has been approved for CIR when medically stable and bed available.  A & P   Principal Problem:   ICH (intracerebral hemorrhage) (HCC) Active Problems:   Metastatic lung cancer (metastasis from lung to other site) [facility])   Metastatic cancer to brain ([place])   Acute lower UTI   1. Found down with confusion in setting of Small cell lung cancer with metastases to brain a. CT brain with small SAH b. MRI with: Severe cerebellar edema, New contrast-enhancing lesions in right cerebellar hemisphere consistent with metastatic disease c. EEG with no epileptiform discharges seen, study consistent with underlying hemorrhage and mild encephalopathy d. No role for surgery per neurosurgery, continue supportive care e. Keep SBP<160 per neurology f. Per neuro-oncology: Clinical and radiographic signs consistent with metastases and subsequent hemorrhagic conversion with progression of recently treated large midline vermis lesion contributing.  Etiology of progression is likely radial inflammatory change however organic tumor progression not ruled out.  Novel lesions best suited for radiosurgery 1 subclinical condition stabilizes.  Case to be discussed at brain/spine tumor board meeting. To discuss  with Dr. Isidore Moos and possible utility of repeat MRI brain.  Continue dexamethasone transition to 4 mg twice daily once improved arousal then 4 mg daily after 5 days of twice daily therapy. 2. Nausea/vomiting a. Continue Zofran 3. UTI a. Continue Rocephin b. Follow-up urine culture  DVT prophylaxis: SCD Diet: Regular Family Communication: Patient's husband has been updated by phone, prefers to be contacted by cell phone [phone] Disposition Plan: CIR once clinically stable when bed available  Consultants  Neurology Neurosurgery Neuro-oncology  Procedures  EEG  Antibiotics   Anti-infectives (From admission, onward)   Start     Dose/Rate Route Frequency Ordered Stop   11/28/19 2200  cefTRIAXone (ROCEPHIN) 1 g in sodium chloride 0.9 % 100 mL IVPB     1 g 200 mL/hr over 30 Minutes Intravenous Every 24 hours 11/28/19 0129     11/28/19 0145  cefTRIAXone (ROCEPHIN) 1 g in sodium chloride 0.9 % 100 mL IVPB     1 g 200 mL/hr over 30 Minutes Intravenous  Once 11/28/19 0131 11/28/19 0226           Subjective   Evaluated at bedside in no acute distress resting comfortably.  Due to her underlying condition she is a poor historian but does not seem to have any complaints at this time. Objective   Vitals:   11/27/19 2334 11/28/19 0200 11/28/19 0403 11/28/19 1610  BP: (!) 161/99 (!) 145/72 131/62 107/64  Pulse: 95 95 87 80  Resp: 16 18 18 16   Temp: (!) 97.5 F (36.4 C) 97.8 F (36.6 C) 97.9 F (36.6 C) 98.3 F (36.8 C)  TempSrc: Oral Oral Oral Oral  SpO2: 98% 99% 98% 97%    Intake/Output  Summary (Last 24 hours) at 11/28/2019 1708 Last data filed at 11/28/2019 0420 Gross per 24 hour  Intake 200 ml  Output 100 ml  Net 100 ml   There were no vitals filed for this visit.  Examination:  Physical Exam Vitals and nursing note reviewed.  Constitutional:      Appearance: She is ill-appearing.  HENT:     Head: Normocephalic.     Mouth/Throat:     Mouth: Mucous  membranes are moist.  Eyes:     Conjunctiva/sclera: Conjunctivae normal.  Cardiovascular:     Rate and Rhythm: Normal rate and regular rhythm.  Pulmonary:     Effort: Pulmonary effort is normal.     Breath sounds: Normal breath sounds.  Abdominal:     General: Abdomen is flat. There is no distension.  Musculoskeletal:        General: No swelling or tenderness. Normal range of motion.  Neurological:     Sensory: No sensory deficit.     Motor: No weakness.     Coordination: Coordination abnormal.     Comments: Coordination impaired of bilateral upper and lower extremity + dysmetria Poor heel to shin      Data Reviewed: I have personally reviewed following labs and imaging studies  CBC: Recent Labs  Lab 11/27/19 1939 11/27/19 1945  WBC 17.3*  --   NEUTROABS 15.7*  --   HGB 14.9 14.6  HCT 44.1 43.0  MCV 91.9  --   PLT 276  --    Basic Metabolic Panel: Recent Labs  Lab 11/27/19 1939 11/27/19 1945 11/28/19 0435  NA 140 139 139  K 3.5 3.5 3.5  CL 107 104 102  CO2 18*  --  24  GLUCOSE 158* 150* 155*  BUN 12 13 12   CREATININE 0.94 0.70 0.81  CALCIUM 9.5  --  9.5   GFR: CrCl cannot be calculated (Unknown ideal weight.). Liver Function Tests: Recent Labs  Lab 11/27/19 1939 11/28/19 0435  AST 19 18  ALT 14 13  ALKPHOS 76 79  BILITOT 0.7 0.7  PROT 6.7 6.6  ALBUMIN 4.1 3.9   No results for input(s): LIPASE, AMYLASE in the last 168 hours. No results for input(s): AMMONIA in the last 168 hours. Coagulation Profile: Recent Labs  Lab 11/27/19 1939  INR 1.0   Cardiac Enzymes: No results for input(s): CKTOTAL, CKMB, CKMBINDEX, TROPONINI in the last 168 hours. BNP (last 3 results) No results for input(s): PROBNP in the last 8760 hours. HbA1C: Recent Labs    11/28/19 0435  HGBA1C 4.8   CBG: Recent Labs  Lab 11/27/19 1941  GLUCAP 152*   Lipid Profile: Recent Labs    11/28/19 0435  CHOL 311*  HDL 53  LDLCALC 241*  TRIG 84  CHOLHDL 5.9    Thyroid Function Tests: No results for input(s): TSH, T4TOTAL, FREET4, T3FREE, THYROIDAB in the last 72 hours. Anemia Panel: No results for input(s): VITAMINB12, FOLATE, FERRITIN, TIBC, IRON, RETICCTPCT in the last 72 hours. Sepsis Labs: No results for input(s): PROCALCITON, LATICACIDVEN in the last 168 hours.  Recent Results (from the past 240 hour(s))  Respiratory Panel by RT PCR (Flu A&B, Covid) - Nasopharyngeal Swab     Status: None   Collection Time: 11/27/19  8:28 PM   Specimen: Nasopharyngeal Swab  Result Value Ref Range Status   SARS Coronavirus 2 by RT PCR NEGATIVE NEGATIVE Final    Comment: (NOTE) SARS-CoV-2 target nucleic acids are NOT DETECTED. The SARS-CoV-2 RNA is generally  detectable in upper respiratoy specimens during the acute phase of infection. The lowest concentration of SARS-CoV-2 viral copies this assay can detect is 131 copies/mL. A negative result does not preclude SARS-Cov-2 infection and should not be used as the sole basis for treatment or other patient management decisions. A negative result may occur with  improper specimen collection/handling, submission of specimen other than nasopharyngeal swab, presence of viral mutation(s) within the areas targeted by this assay, and inadequate number of viral copies (<131 copies/mL). A negative result must be combined with clinical observations, patient history, and epidemiological information. The expected result is Negative. Fact Sheet for Patients:  PinkCheek.be Fact Sheet for Healthcare Providers:  GravelBags.it This test is not yet ap proved or cleared by the [place] FDA and  has been authorized for detection and/or diagnosis of SARS-CoV-2 by FDA under an Emergency Use Authorization (EUA). This EUA will remain  in effect (meaning this test can be used) for the duration of the COVID-19 declaration under Section 564(b)(1) of the Act, 21  U.S.C. section 360bbb-3(b)(1), unless the authorization is terminated or revoked sooner.    Influenza A by PCR NEGATIVE NEGATIVE Final   Influenza B by PCR NEGATIVE NEGATIVE Final    Comment: (NOTE) The Xpert Xpress SARS-CoV-2/FLU/RSV assay is intended as an aid in  the diagnosis of influenza from Nasopharyngeal swab specimens and  should not be used as a sole basis for treatment. Nasal washings and  aspirates are unacceptable for Xpert Xpress SARS-CoV-2/FLU/RSV  testing. Fact Sheet for Patients: PinkCheek.be Fact Sheet for Healthcare Providers: GravelBags.it This test is not yet approved or cleared by the [place] FDA and  has been authorized for detection and/or diagnosis of SARS-CoV-2 by  FDA under an Emergency Use Authorization (EUA). This EUA will remain  in effect (meaning this test can be used) for the duration of the  Covid-19 declaration under Section 564(b)(1) of the Act, 21  U.S.C. section 360bbb-3(b)(1), unless the authorization is  terminated or revoked. Performed at [facility], [place] [address] [place], [place] [address]          Radiology Studies: EEG  Result Date: 11/28/2019 Lora Havens, MD     11/28/2019  9:51 AM Patient Name: NAZLI PENN MRN: [id number] Epilepsy Attending: Lora Havens Referring Physician/Provider: Dr. 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 Lennox Solders status and slurred speech.  EEG to evaluate for seizures. Level of alertness: Awake, asleep AEDs during EEG study: Keppra Technical aspects: This EEG study was done with scalp electrodes positioned according to the 10-20 International system of electrode placement. Electrical activity was acquired at a sampling rate of 500Hz  and reviewed with a high frequency filter of 70Hz  and a low  frequency filter of 1Hz . EEG data were recorded continuously and digitally stored. Description: The posterior dominant rhythm consists of 8 Hz activity of moderate voltage (25-35 uV) seen predominantly in posterior head regions, symmetric and reactive to eye opening and eye closing.  Sleep was characterized by vertex waves, sleep spindles (12 to 14 Hz), maximal frontocentral.  EEG showed continuous generalized and maximal right temporal 3 to 5 Hz theta-delta slowing.  Hyperventilation and photic stimulation were not performed. Abnormality -Continuous slow, generalized, maximal right temporal    IMPRESSION: This study showed evidence of cortical dysfunction in right temporal region likely secondary to underlying hemorrhage as well as mild diffuse encephalopathy,  nonspecific etiology. No seizures or epileptiform discharges were seen throughout the recording. Lora Havens   MR Brain W and Wo Contrast  Result Date: 11/28/2019 CLINICAL DATA:  Seizure and intracranial metastatic disease. EXAM: MRI HEAD WITHOUT AND WITH CONTRAST TECHNIQUE: Multiplanar, multiecho pulse sequences of the brain and surrounding structures were obtained without and with intravenous contrast. CONTRAST:  46mL GADAVIST GADOBUTROL 1 MMOL/ML IV SOLN COMPARISON:  10/17/2019 brain MRI Head CT 11/27/2019 FINDINGS: BRAIN: Intraparenchymal hematomas within the right cerebellum and right temporal lobe unchanged. There is severe cerebellar edema but no transtentorial herniation. Small amount of subarachnoid blood over the right temporal lobe is unchanged. The white matter signal is normal for the patient's age. No hydrocephalus. There is heterogeneous contrast enhancement associated with the right cerebellar hemorrhage. Inferior vermis lesion has increased in size and now measures 3.3 cm. A smaller lesion at the dorsal aspect of the cerebral aqua duct is decreased in size and now measures 1.2 cm. Anterior right cerebellar lesion is new or increased  in size, measuring 8 mm (series 22, image 15). VASCULAR: The major intracranial arterial and venous sinus flow voids are normal. Susceptibility-sensitive sequences show no chronic microhemorrhage or superficial siderosis. SKULL AND UPPER CERVICAL SPINE: Left parietal scalp subgaleal hematoma. No focal calvarial lesions. SINUSES/ORBITS: Bilateral mastoid fluid. The orbits are normal. IMPRESSION: 1. Unchanged size of intraparenchymal hematomas within the right cerebellum and right temporal lobe. 2. Severe cerebellar edema without transtentorial or tonsillar herniation. 3. New contrast-enhancing lesions within the right cerebellar hemisphere, consistent with worsening intracranial metastatic disease. No supratentorial metastatic disease. Electronically Signed   By: Ulyses Jarred M.D.   On: 11/28/2019 03:50   DG CHEST PORT 1 VIEW  Result Date: 11/28/2019 CLINICAL DATA:  Nausea and vomiting.  History of stroke. EXAM: PORTABLE CHEST 1 VIEW COMPARISON:  CT 10/29/2019.  Chest x-ray 07/07/2019, 12/14/2015. FINDINGS: Mediastinum appears stable. Heart size stable. Stable changes of pleural-parenchymal scarring noted over the left suprahilar region consistent with prior radiation therapy. Mild bibasilar atelectasis. No prominent pleural effusion. No pneumothorax. Surgical clips left chest. Thoracic spine scoliosis. IMPRESSION: 1. Stable changes of pleural-parenchymal scarring noted over the left suprahilar region consistent prior radiation therapy. 2.  Mild bibasilar atelectasis. Electronically Signed   By: Marcello Moores  Register   On: 11/28/2019 07:25   CT HEAD CODE STROKE WO CONTRAST  Result Date: 11/27/2019 CLINICAL DATA:  Altered mental status, slurred speech. Last known normal 16:30 EXAM: CT HEAD WITHOUT CONTRAST TECHNIQUE: Contiguous axial images were obtained from the base of the skull through the vertex without intravenous contrast. COMPARISON:  Brain MRI 10/17/2019 FINDINGS: Brain: Again demonstrated is edema within  the right greater than left cerebellar hemispheres. Known metastatic lesions within the right cerebellum and midline cerebellar vermis were better appreciated on brain MRI 10/17/2019. Superimposed acute parenchymal hemorrhage within the right cerebellum. The dominant component of the hemorrhage measures 2.6 x 1.9 x 2.2 cm (series 2, image 7) (series 4, image 45). Posterior fossa mass effect appears similar to prior MRI 10/17/2019 with partial effacement of the fourth ventricle. The lateral and third ventricles are similar in size as compared to prior MRI. There is a small hemorrhagic parenchymal contusion within the inferolateral right temporal lobe with a small amount of adjacent acute subarachnoid hemorrhage (series 4, image 32) (series 2, image 13). No supratentorial midline shift. Unchanged generalized parenchymal atrophy. Vascular: No hyperdense vessel. Skull: Although not definitively identified, a left temporal bone fracture is suspected given the presence of a left mastoid  effusion and adjacent subcutaneous emphysema. Sinuses/Orbits: Visualized orbits demonstrate no acute abnormality. Mild mucosal thickening within the right maxillary sinus. Left mastoid effusion. Other: There is subcutaneous gas within the left masticator/parapharyngeal space. Left posterior scalp hematoma. These results were called by telephone at the time of interpretation on 11/27/2019 at 8:02 pm to provider Dr. Lorraine Lax, who verbally acknowledged these results. IMPRESSION: 1. Prominent edema within the right greater than left cerebellar hemispheres at site of known metastases. Superimposed acute parenchymal hemorrhage within the right cerebellum measuring 2.6 x 1.9 x 2.2 cm. Posterior fossa mass effect is similar to prior MRI with partial effacement of the fourth ventricle. 2. Small hemorrhagic contusion within the inferolateral right temporal lobe with adjacent small volume acute subarachnoid hemorrhage. 3. Although not definitively  identified, an acute fracture of the left temporal bone is suspected given the presence of a left mastoid effusion and adjacent soft tissue gas within the left masticator/parapharyngeal space. 4. Left posterior scalp hematoma. Electronically Signed   By: Kellie Simmering DO   On: 11/27/2019 20:19        Scheduled Meds: . dexamethasone (DECADRON) injection  4 mg Intravenous Q6H  . levETIRAcetam  500 mg Oral BID   Continuous Infusions: . cefTRIAXone (ROCEPHIN)  IV       LOS: 1 day    Time spent: 33 minutes with over 50% of the time coordinating the patient's care    Harold Hedge, DO [facility] Pager [phone]  If 7PM-7AM, please contact night-coverage [email or web address] Password [facility] 11/28/2019, 5:08 PM

## 2019-11-28 NOTE — Progress Notes (Signed)
STROKE TEAM PROGRESS NOTE   INTERVAL HISTORY I have reviewed history of presenting illness with the patient, electronic medical records and imaging films in PACS.  She presented with confusion and brain imaging shows slight hemorrhage adjacent to her known metastasis as well as small area of cerebral arachnoid hemorrhage and multiple new additional metastasis.  Vitals:   11/27/19 2230 11/27/19 2334 11/28/19 0200 11/28/19 0403  BP: (!) 145/74 (!) 161/99 (!) 145/72 131/62  Pulse: 90 95 95 87  Resp: 20 16 18 18   Temp:  (!) 97.5 F (36.4 C) 97.8 F (36.6 C) 97.9 F (36.6 C)  TempSrc:  Oral Oral Oral  SpO2: 99% 98% 99% 98%    CBC:  Recent Labs  Lab 11/27/19 1939 11/27/19 1945  WBC 17.3*  --   NEUTROABS 15.7*  --   HGB 14.9 14.6  HCT 44.1 43.0  MCV 91.9  --   PLT 276  --     Basic Metabolic Panel:  Recent Labs  Lab 11/27/19 1939 11/27/19 1939 11/27/19 1945 11/28/19 0435  NA 140   < > 139 139  K 3.5   < > 3.5 3.5  CL 107   < > 104 102  CO2 18*  --   --  24  GLUCOSE 158*   < > 150* 155*  BUN 12   < > 13 12  CREATININE 0.94   < > 0.70 0.81  CALCIUM 9.5  --   --  9.5   < > = values in this interval not displayed.   Lipid Panel:     Component Value Date/Time   CHOL 311 (H) 11/28/2019 0435   TRIG 84 11/28/2019 0435   HDL 53 11/28/2019 0435   CHOLHDL 5.9 11/28/2019 0435   VLDL 17 11/28/2019 0435   LDLCALC 241 (H) 11/28/2019 0435   HgbA1c:  Lab Results  Component Value Date   HGBA1C 4.8 11/28/2019   Urine Drug Screen:     Component Value Date/Time   LABOPIA NONE DETECTED 11/27/2019 2139   COCAINSCRNUR NONE DETECTED 11/27/2019 2139   LABBENZ NONE DETECTED 11/27/2019 2139   AMPHETMU NONE DETECTED 11/27/2019 2139   THCU NONE DETECTED 11/27/2019 2139   LABBARB NONE DETECTED 11/27/2019 2139    Alcohol Level     Component Value Date/Time   ETH <10 11/27/2019 2016    IMAGING past 48 hours EEG  Result Date: 11/28/2019 Hailey Havens, MD     11/28/2019   9:51 AM Patient Name: Hailey Hill MRN: [id number] Epilepsy Attending: Lora Hill Referring Physician/Provider: Dr. 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 Lennox Solders status and slurred speech.  EEG to evaluate for seizures. Level of alertness: Awake, asleep AEDs during EEG study: Keppra Technical aspects: This EEG study was done with scalp electrodes positioned according to the 10-20 International system of electrode placement. Electrical activity was acquired at a sampling rate of 500Hz  and reviewed with a high frequency filter of 70Hz  and a low frequency filter of 1Hz . EEG data were recorded continuously and digitally stored. Description: The posterior dominant rhythm consists of 8 Hz activity of moderate voltage (25-35 uV) seen predominantly in posterior head regions, symmetric and reactive to eye opening and eye closing.  Sleep was characterized by vertex waves, sleep spindles (12 to 14 Hz), maximal frontocentral.  EEG showed continuous generalized and maximal right temporal 3 to 5 Hz  theta-delta slowing.  Hyperventilation and photic stimulation were not performed. Abnormality -Continuous slow, generalized, maximal right temporal    IMPRESSION: This study showed evidence of cortical dysfunction in right temporal region likely secondary to underlying hemorrhage as well as mild diffuse encephalopathy, nonspecific etiology. No seizures or epileptiform discharges were seen throughout the recording. Hailey Hill   MR Brain W and Wo Contrast  Result Date: 11/28/2019 CLINICAL DATA:  Seizure and intracranial metastatic disease. EXAM: MRI HEAD WITHOUT AND WITH CONTRAST TECHNIQUE: Multiplanar, multiecho pulse sequences of the brain and surrounding structures were obtained without and with intravenous contrast. CONTRAST:  1mL GADAVIST GADOBUTROL 1 MMOL/ML IV SOLN COMPARISON:   10/17/2019 brain MRI Head CT 11/27/2019 FINDINGS: BRAIN: Intraparenchymal hematomas within the right cerebellum and right temporal lobe unchanged. There is severe cerebellar edema but no transtentorial herniation. Small amount of subarachnoid blood over the right temporal lobe is unchanged. The white matter signal is normal for the patient's age. No hydrocephalus. There is heterogeneous contrast enhancement associated with the right cerebellar hemorrhage. Inferior vermis lesion has increased in size and now measures 3.3 cm. A smaller lesion at the dorsal aspect of the cerebral aqua duct is decreased in size and now measures 1.2 cm. Anterior right cerebellar lesion is new or increased in size, measuring 8 mm (series 22, image 15). VASCULAR: The major intracranial arterial and venous sinus flow voids are normal. Susceptibility-sensitive sequences show no chronic microhemorrhage or superficial siderosis. SKULL AND UPPER CERVICAL SPINE: Left parietal scalp subgaleal hematoma. No focal calvarial lesions. SINUSES/ORBITS: Bilateral mastoid fluid. The orbits are normal. IMPRESSION: 1. Unchanged size of intraparenchymal hematomas within the right cerebellum and right temporal lobe. 2. Severe cerebellar edema without transtentorial or tonsillar herniation. 3. New contrast-enhancing lesions within the right cerebellar hemisphere, consistent with worsening intracranial metastatic disease. No supratentorial metastatic disease. Electronically Signed   By: Ulyses Jarred M.D.   On: 11/28/2019 03:50   DG CHEST PORT 1 VIEW  Result Date: 11/28/2019 CLINICAL DATA:  Nausea and vomiting.  History of stroke. EXAM: PORTABLE CHEST 1 VIEW COMPARISON:  CT 10/29/2019.  Chest x-ray 07/07/2019, 12/14/2015. FINDINGS: Mediastinum appears stable. Heart size stable. Stable changes of pleural-parenchymal scarring noted over the left suprahilar region consistent with prior radiation therapy. Mild bibasilar atelectasis. No prominent pleural  effusion. No pneumothorax. Surgical clips left chest. Thoracic spine scoliosis. IMPRESSION: 1. Stable changes of pleural-parenchymal scarring noted over the left suprahilar region consistent prior radiation therapy. 2.  Mild bibasilar atelectasis. Electronically Signed   By: Marcello Moores  Register   On: 11/28/2019 07:25   CT HEAD CODE STROKE WO CONTRAST  Result Date: 11/27/2019 CLINICAL DATA:  Altered mental status, slurred speech. Last known normal 16:30 EXAM: CT HEAD WITHOUT CONTRAST TECHNIQUE: Contiguous axial images were obtained from the base of the skull through the vertex without intravenous contrast. COMPARISON:  Brain MRI 10/17/2019 FINDINGS: Brain: Again demonstrated is edema within the right greater than left cerebellar hemispheres. Known metastatic lesions within the right cerebellum and midline cerebellar vermis were better appreciated on brain MRI 10/17/2019. Superimposed acute parenchymal hemorrhage within the right cerebellum. The dominant component of the hemorrhage measures 2.6 x 1.9 x 2.2 cm (series 2, image 7) (series 4, image 45). Posterior fossa mass effect appears similar to prior MRI 10/17/2019 with partial effacement of the fourth ventricle. The lateral and third ventricles are similar in size as compared to prior MRI. There is a small hemorrhagic parenchymal contusion within the inferolateral right temporal lobe with a small amount of  adjacent acute subarachnoid hemorrhage (series 4, image 32) (series 2, image 13). No supratentorial midline shift. Unchanged generalized parenchymal atrophy. Vascular: No hyperdense vessel. Skull: Although not definitively identified, a left temporal bone fracture is suspected given the presence of a left mastoid effusion and adjacent subcutaneous emphysema. Sinuses/Orbits: Visualized orbits demonstrate no acute abnormality. Mild mucosal thickening within the right maxillary sinus. Left mastoid effusion. Other: There is subcutaneous gas within the left  masticator/parapharyngeal space. Left posterior scalp hematoma. These results were called by telephone at the time of interpretation on 11/27/2019 at 8:02 pm to provider Dr. Lorraine Lax, who verbally acknowledged these results. IMPRESSION: 1. Prominent edema within the right greater than left cerebellar hemispheres at site of known metastases. Superimposed acute parenchymal hemorrhage within the right cerebellum measuring 2.6 x 1.9 x 2.2 cm. Posterior fossa mass effect is similar to prior MRI with partial effacement of the fourth ventricle. 2. Small hemorrhagic contusion within the inferolateral right temporal lobe with adjacent small volume acute subarachnoid hemorrhage. 3. Although not definitively identified, an acute fracture of the left temporal bone is suspected given the presence of a left mastoid effusion and adjacent soft tissue gas within the left masticator/parapharyngeal space. 4. Left posterior scalp hematoma. Electronically Signed   By: Kellie Simmering DO   On: 11/27/2019 20:19    PHYSICAL EXAM Pleasant middle-aged Caucasian lady sitting up in bedside chair.  Not in distress. . Afebrile. Head is nontraumatic. Neck is supple without bruit.    Cardiac exam no murmur or gallop. Lungs are clear to auscultation. Distal pulses are well felt. Neurological Exam :  She is awake alert oriented to time place and person.  Speech is hypophonic can be understood.  Mild dysarthria but no aphasia.  Extraocular movements are full range.  Saccadic dysmetria on right lateral gaze.  Blinks to threat bilaterally.  Pupils equal reactive.  Face is symmetric without weakness.  Tongue midline.  Motor system exam symmetric upper and lower extremity strength without focal weakness.  Deep tendon reflexes are symmetric plantars downgoing.  Finger-to-nose and neutral coordination of slow but accurate.  Gait not tested.  ASSESSMENT/PLAN Hailey Hill is a 69 y.o. 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   Metastatic small cell lung cancer w/ brain mets s/p XRT, now w/ hemorrhage in brain mets Fall w/ TBI, resultant R temporal lobe contusion w/ SAH  Code Stroke CT head R>L cerebellar edema at site of known mets. R cerebellar ICH w/ mass effect and partial effacement 4th ventricle. Small hemorrhagic contusion inferolateral R temporal lobe w/ adjacent small SAH.  Acute fx L temporal bone suspected given L mastoid effusion and soft tissue gas L masticuator/parapharyngeal space. L posterior scalp hematoma.   MRI  Unchanged R cerebellar and R temporal lobe hemorrhages. Severe cerebellar edema w/o herniation. New contrast-enhancing lesions R cerebellar c/w worsening metastatic dz.   Treated w/ decadron  NSG consulted [facility]) not a surgical candidate  Last chest, abd pelvis imaging neg for mets  No antithrombotic prior to admission  Consulted Dr. Cecil Cobbs, neuro oncologist  Therapy recommendations:  CIR  Disposition:  pending   Likely Seizure   given altered mental status, Secondary to bleeding into brain mets  EEG -Continuous slow, generalized, maximal right temporal     on Keppra 500 bid following 1 gm load  Seizure precautions  Repeat EEG pending  Hypertension . BP goal not indicated for metastatic bleeding  . BP goal normotensive  Hyperlipidemia  Home meds:  No statin  LDL 241  Other Stroke Risk Factors  Advanced age  Former Cigarette smoker, quit 9 yrs ago  Other Active Problems  Nausea/vomiting d/t cerebral edema, hemorrhages  Acute lower UTI  Hospital day # 1  I have personally obtained history,examined this patient, reviewed notes, independently viewed imaging studies, participated in medical decision making and plan of care.ROS completed by me personally and pertinent positives fully documented  I have  made any additions or clarifications directly to the above note.  She presented with confusion and CT scan and MRI showed hemorrhagic right cerebellar metastasis with postcontrast imaging showing additionally metastasis.  I do not have anything much to contribute from neurovascular standpoint except keep systolic blood pressure less than 160.  Patient has been started on steroids.  I recommend consultation with Dr. Mickeal Skinner neuro oncologist to see if he has anything to contribute.  Neurosurgery has already seen the patient and declined further surgical intervention.  Greater than 50% time during the 35-minute visit was spent in counseling and coordination of care and discussion with care team.  Stroke team will sign off.  Kindly call for questions.  Antony Contras, MD Medical Director [facility] Pager: [phone] 11/28/2019 3:05 PM   To contact Stroke Continuity provider, please refer to [email or web address] After hours, contact General Neurology

## 2019-11-28 NOTE — Telephone Encounter (Signed)
Dr Neysa Bonito called to let Dr Julien Nordmann know that Hailey Hill was admitted to [facility] - MRI>new cerebellar brain mets, on steroids. Neuro saw today -no other recommendations. Having EEG today.

## 2019-11-28 NOTE — Progress Notes (Signed)
EEG complete - results pending 

## 2019-11-28 NOTE — Evaluation (Signed)
Physical Therapy Evaluation Patient Details Name: Hailey Hill MRN: [id number] DOB: 10/04/1951 Today's Date: 11/28/2019   History of Present Illness  69 year old female with known metastatic disease has undergone radiation treatment and stereotactic radiosurgery to multiple brain mets primarily cerebellum.  Presented with altered mental status last night showing small amount of hemorrhage around a known right cerebellar hemisphere lesion.  Vermian lesion stable additional lesions also noted that were not there on MRI scan a month ago.  Clinical Impression  Pt admitted with above. Pt presents with decreased functional mobility secondary to balance impairments, impaired motor planning, decreased coordination, and cognitive deficits. Pt requiring moderate assist for transfers (+2 safety) and limited room ambulation with and without use of walker. Further distance deferred due to pt report of nausea. Would benefit from CIR to address deficits and maximize functional independence.     Follow Up Recommendations CIR;Supervision/Assistance - 24 hour    Equipment Recommendations  Rolling walker with 5" wheels    Recommendations for Other Services Rehab consult     Precautions / Restrictions Precautions Precautions: Fall Restrictions Weight Bearing Restrictions: No      Mobility  Bed Mobility Overal bed mobility: Needs Assistance Bed Mobility: Supine to Sit     Supine to sit: Mod assist;+2 for safety/equipment     General bed mobility comments: ModA to pull up to sit, and guide legs out of bed  Transfers Overall transfer level: Needs assistance Equipment used: 2 person hand held assist;Rolling walker (2 wheeled) Transfers: Sit to/from [facility] Sit to Stand: +2 safety/equipment;Mod assist Stand pivot transfers: Mod assist;+2 safety/equipment       General transfer comment: ModA (+2 for safety) to stand with HHA and transfer on and off BSC. With walker, slightly  more stable, displaying step to pattern and narrow BOS for pivotal steps from bed to recliner.   Ambulation/Gait                Stairs            Wheelchair Mobility    Modified Rankin (Stroke Patients Only) Modified Rankin (Stroke Patients Only) Pre-Morbid Rankin Score: No symptoms Modified Rankin: Moderately severe disability     Balance Overall balance assessment: Needs assistance Sitting-balance support: Feet supported Sitting balance-Leahy Scale: Fair     Standing balance support: Single extremity supported;During functional activity Standing balance-Leahy Scale: Poor Standing balance comment: reliant on external support                             Pertinent Vitals/Pain Pain Assessment: Faces Faces Pain Scale: No hurt    Home Living Family/patient expects to be discharged to:: Private residence Living Arrangements: Spouse/significant other Available Help at Discharge: Family Type of Home: House Home Access: Stairs to enter Entrance Stairs-Rails: Right Entrance Stairs-Number of Steps: 4 Home Layout: One level Home Equipment: Grab bars - tub/shower      Prior Function Level of Independence: Independent               Hand Dominance   Dominant Hand: Right    Extremity/Trunk Assessment   Upper Extremity Assessment Upper Extremity Assessment: Defer to OT evaluation LUE Deficits / Details: Pt reports decreased ROM due to h/o breaking Lt arm    Lower Extremity Assessment Lower Extremity Assessment: RLE deficits/detail;LLE deficits/detail RLE Deficits / Details: Strength 5/5 LLE Deficits / Details: Strength 5/5    Cervical / Trunk Assessment Cervical / Trunk Assessment: Normal  Communication   Communication: No difficulties  Cognition Arousal/Alertness: Awake/alert Behavior During Therapy: Flat affect Overall Cognitive Status: Impaired/Different from baseline Area of Impairment: Orientation;Safety/judgement;Problem  solving;Awareness;Following commands                 Orientation Level: Disoriented to;Situation     Following Commands: Follows one step commands inconsistently Safety/Judgement: Decreased awareness of safety;Decreased awareness of deficits Awareness: Intellectual Problem Solving: Difficulty sequencing;Requires verbal cues;Slow processing General Comments: A&Ox3, not oriented to situation. Slow psychomotor processing, slightly impulsive, decreased awareness of safety/deficits.      General Comments      Exercises     Assessment/Plan    PT Assessment Patient needs continued PT services  PT Problem List Decreased strength;Decreased activity tolerance;Decreased balance;Decreased mobility;Decreased coordination;Decreased cognition;Decreased safety awareness       PT Treatment Interventions DME instruction;Gait training;Stair training;Functional mobility training;Therapeutic activities;Therapeutic exercise;Balance training;Patient/family education    PT Goals (Current goals can be found in the Care Plan section)  Acute Rehab PT Goals Patient Stated Goal: none stated, agreeable to therapy PT Goal Formulation: With patient Time For Goal Achievement: 12/12/19 Potential to Achieve Goals: Good    Frequency Min 4X/week   Barriers to discharge        Co-evaluation PT/OT/SLP Co-Evaluation/Treatment: Yes Reason for Co-Treatment: Necessary to address cognition/behavior during functional activity;For patient/therapist safety;To address functional/ADL transfers PT goals addressed during session: Mobility/safety with mobility OT goals addressed during session: ADL's and self-care       AM-PAC PT "6 Clicks" Mobility  Outcome Measure Help needed turning from your back to your side while in a flat bed without using bedrails?: A Little Help needed moving from lying on your back to sitting on the side of a flat bed without using bedrails?: A Lot Help needed moving to and from a  bed to a chair (including a wheelchair)?: A Lot Help needed standing up from a chair using your arms (e.g., wheelchair or bedside chair)?: A Lot Help needed to walk in hospital room?: A Lot Help needed climbing 3-5 steps with a railing? : A Lot 6 Click Score: 13    End of Session Equipment Utilized During Treatment: Gait belt Activity Tolerance: Patient tolerated treatment well Patient left: in chair;with call bell/phone within reach;with chair alarm set Nurse Communication: Mobility status PT Visit Diagnosis: Unsteadiness on feet (R26.81);Difficulty in walking, not elsewhere classified (R26.2)    Time: 7408-1448 PT Time Calculation (min) (ACUTE ONLY): 29 min   Charges:   PT Evaluation $PT Eval Moderate Complexity: 1 Mod          Ellamae Sia, [place], DPT Acute Rehabilitation Services Pager [phone] Office [phone]   Willy Eddy 11/28/2019, 1:24 PM

## 2019-11-28 NOTE — Consult Note (Signed)
Physical Medicine and Rehabilitation Consult  Reason for Consult: Metastatic brain cancer Referring Physician: Dr. Marva Panda   HPI: Hailey Hill is a 69 y.o. 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 CT head done showing small hemorrhage in right cerebellum without change in mass effect and small hemorrhagic contusion in right temporal lobe. Neurology consulted and felt that patient likely with seizure activity leading to LOC, fall and trauma to her head. She was placed on Keppra and decadron added for cerebellar edema. Dr. Saintclair Halsted consulted for input and felt patient with brain mets with additions lesions likely due to failure of tx and supportive care recommended. Dr. Lanell Persons and Dr. Mickeal Skinner  to follow for input on next step.  Therapy evaluations completed revealing mild to moderate cognitive deficits with delayed processing, nausea with activity, decreased motor planning, diplopia and balance deficits affecting ADLs and mobility. CIR recommended due to functional decline.   Review of Systems  Constitutional: Negative for fever.  HENT: Negative for hearing loss and tinnitus.   Eyes: Negative for blurred vision.  Respiratory: Negative for cough and shortness of breath.   Cardiovascular: Negative for chest pain, palpitations and leg swelling.  Gastrointestinal: Negative for constipation, heartburn and nausea.  Genitourinary: Negative for dysuria and urgency.  Musculoskeletal: Positive for falls (reports that balance is not too good). Negative for back pain and myalgias.  Skin: Negative for itching and rash.  Neurological: Positive for seizures and weakness. Negative for dizziness and headaches.  Psychiatric/Behavioral: Negative for depression.  The patient is not nervous/anxious.       Past Medical History:  Diagnosis Date  . Complication of anesthesia    had complications with "gas buildup" after tubal lig  . Dyslipidemia   . History of radiation therapy 07/16/19, 07/19/19, 07/22/19   SRS radiation to 2 cerebellum targets.   . Lung cancer ([place]) dx'd 07/2010   sm cell   . Peripheral neuropathy   . SVC syndrome   . Thrombus 65/78/4696   Left basilic vein,  Left subclavian vein    Past Surgical History:  Procedure Laterality Date  . CERVICAL BIOPSY    . CHOLECYSTECTOMY N/A 07/25/2016   Procedure: LAPAROSCOPIC CHOLECYSTECTOMY;  Surgeon: Coralie Keens, MD;  Location: [place];  Service: General;  Laterality: N/A;  . TONSILLECTOMY    . TUBAL LIGATION      Family History  Problem Relation Age of Onset  . Nephrolithiasis Son   . Heart attack Father   . Heart attack Paternal Uncle   . Heart attack Paternal Grandfather     Social History: Married. Disabled? --uses cane and furniture walks at times. Husband was in maintenance for [employer]. She  reports that she quit smoking about 9 years ago. She has never used smokeless tobacco. She reports that she does not drink alcohol or use drugs.    Allergies  Allergen Reactions  . Promethazine Hcl Other (See Comments)    Pt reports feeling shaky and "sick" after phenergan    Medications Prior to Admission  Medication Sig Dispense Refill  . cholecalciferol (VITAMIN D3) 25 MCG (1000 UNIT) tablet Take 1,000 Units by mouth daily.    . vitamin B-12 (CYANOCOBALAMIN) 1000 MCG tablet Take 1,000 mcg by mouth daily.  Home: Home Living Family/patient expects to be discharged to:: Private residence Living Arrangements: Spouse/significant other Available Help at Discharge: Family Type of Home: House Home Access: Stairs to enter Technical brewer of Steps: 4 Entrance Stairs-Rails: Right Home Layout: One level Bathroom Shower/Tub: Tub/shower unit Home  Equipment: Grab bars - tub/shower  Functional History: Prior Function Level of Independence: Independent Functional Status:  Mobility: Bed Mobility Overal bed mobility: Needs Assistance Bed Mobility: Supine to Sit Supine to sit: Mod assist, +2 for safety/equipment General bed mobility comments: ModA to pull up to sit, and guide legs out of bed Transfers Overall transfer level: Needs assistance Equipment used: 2 person hand held assist, Rolling walker (2 wheeled) Transfers: Sit to/from Stand, Stand Pivot Transfers Sit to Stand: +2 safety/equipment, Mod assist Stand pivot transfers: Mod assist, +2 safety/equipment General transfer comment: ModA (+2 for safety) to stand with HHA and transfer on and off BSC. With walker, slightly more stable, displaying step to pattern and narrow BOS for pivotal steps from bed to recliner.      ADL: ADL Overall ADL's : Needs assistance/impaired Grooming: Min guard, Sitting Grooming Details (indicate cue type and reason): min guard for sitting balance due to instability and decreased balance reactions Lower Body Dressing: Maximal assistance Lower Body Dressing Details (indicate cue type and reason): Pt reports nausea when weight shifting to begin reaching towards feet, unable to complete due to nausea and decreased sustained attention to task Toilet Transfer: Moderate assistance, +2 for safety/equipment, BSC Toilet Transfer Details (indicate cue type and reason): Min assit for sit > stand difficulty with marching in place to simulate ambulation forward, therefore completed stand pivot transfer to [facility] with mod assist and +2 for safety due to decreased BLE motor control Toileting- Clothing Manipulation and Hygiene: +2 for physical assistance, Moderate assistance Functional mobility during ADLs: +2 for safety/equipment, Moderate assistance, Minimal assistance General ADL Comments: Min assist sit> stand, Mod assist for stand pivot and short distance ambulation  with RW +2 for safety  Cognition: Cognition Overall Cognitive Status: Impaired/Different from baseline Arousal/Alertness: Awake/alert(drowsy) Orientation Level: Oriented X4 Attention: Sustained Sustained Attention: Appears intact Memory: Impaired Memory Impairment: Decreased short term memory Problem Solving: Appears intact Executive Function: Reasoning Reasoning: Appears intact Cognition Arousal/Alertness: Awake/alert Behavior During Therapy: Flat affect Overall Cognitive Status: Impaired/Different from baseline Area of Impairment: Orientation, Safety/judgement, Problem solving, Awareness, Following commands Orientation Level: Disoriented to, Situation Following Commands: Follows one step commands inconsistently Safety/Judgement: Decreased awareness of safety, Decreased awareness of deficits Awareness: Intellectual Problem Solving: Difficulty sequencing, Requires verbal cues, Slow processing General Comments: A&Ox3, not oriented to situation. Slow psychomotor processing, slightly impulsive, decreased awareness of safety/deficits.   Blood pressure 131/62, pulse 87, temperature 97.9 F (36.6 C), temperature source Oral, resp. rate 18, SpO2 98 %.  Physical Exam  Nursing note and vitals reviewed. General: Alert and oriented x 3, No apparent distress HEENT: Head is normocephalic, atraumatic, PERRLA, EOMI, sclera anicteric, oral mucosa pink and moist, dentition intact, ext ear canals clear, Right ptosis but able to open eyes with cues. Neck: Supple without JVD or lymphadenopathy Heart: Reg rate and rhythm. No murmurs rubs or gallops Chest: CTA bilaterally without wheezes, rales, or rhonchi; no distress Abdomen: Soft, non-tender, non-distended, bowel sounds positive. Extremities: No clubbing, cyanosis, or edema. Pulses are 2+ Skin: Clean and intact without signs of breakdown Neurological: She is easily aroused. She appears lethargic. Right facial weakness with right  ptosis.Anarthric. Lethargic and needed cues to stay awake and engage in exam. Able to answer  orientation questions -date, DOB, president and situation. Able to follow simple one step motor commands.  Difficulty spelling WORLD backwards. 4+/5 strength throughout except 4-/5 in LLE. Psych: Pt's affect is appropriate. Pt is cooperative  Results for orders placed or performed during the hospital encounter of 11/27/19 (from the past 24 hour(s))  Protime-INR     Status: None   Collection Time: 11/27/19  7:39 PM  Result Value Ref Range   Prothrombin Time 12.6 11.4 - 15.2 seconds   INR 1.0 0.8 - 1.2  APTT     Status: Abnormal   Collection Time: 11/27/19  7:39 PM  Result Value Ref Range   aPTT 23 (L) 24 - 36 seconds  CBC     Status: Abnormal   Collection Time: 11/27/19  7:39 PM  Result Value Ref Range   WBC 17.3 (H) 4.0 - 10.5 K/uL   RBC 4.80 3.87 - 5.11 MIL/uL   Hemoglobin 14.9 12.0 - 15.0 g/dL   HCT 44.1 36.0 - 46.0 %   MCV 91.9 80.0 - 100.0 fL   MCH 31.0 26.0 - 34.0 pg   MCHC 33.8 30.0 - 36.0 g/dL   RDW 12.5 11.5 - 15.5 %   Platelets 276 150 - 400 K/uL   nRBC 0.0 0.0 - 0.2 %  Differential     Status: Abnormal   Collection Time: 11/27/19  7:39 PM  Result Value Ref Range   Neutrophils Relative % 90 %   Neutro Abs 15.7 (H) 1.7 - 7.7 K/uL   Lymphocytes Relative 4 %   Lymphs Abs 0.6 (L) 0.7 - 4.0 K/uL   Monocytes Relative 5 %   Monocytes Absolute 0.8 0.1 - 1.0 K/uL   Eosinophils Relative 0 %   Eosinophils Absolute 0.0 0.0 - 0.5 K/uL   Basophils Relative 0 %   Basophils Absolute 0.1 0.0 - 0.1 K/uL   Immature Granulocytes 1 %   Abs Immature Granulocytes 0.11 (H) 0.00 - 0.07 K/uL  Comprehensive metabolic panel     Status: Abnormal   Collection Time: 11/27/19  7:39 PM  Result Value Ref Range   Sodium 140 135 - 145 mmol/L   Potassium 3.5 3.5 - 5.1 mmol/L   Chloride 107 98 - 111 mmol/L   CO2 18 (L) 22 - 32 mmol/L   Glucose, Bld 158 (H) 70 - 99 mg/dL   BUN 12 8 - 23 mg/dL    Creatinine, Ser 0.94 0.44 - 1.00 mg/dL   Calcium 9.5 8.9 - 10.3 mg/dL   Total Protein 6.7 6.5 - 8.1 g/dL   Albumin 4.1 3.5 - 5.0 g/dL   AST 19 15 - 41 U/L   ALT 14 0 - 44 U/L   Alkaline Phosphatase 76 38 - 126 U/L   Total Bilirubin 0.7 0.3 - 1.2 mg/dL   GFR calc non Af Amer >60 >60 mL/min   GFR calc Af Amer >60 >60 mL/min   Anion gap 15 5 - 15  CBG monitoring, ED     Status: Abnormal   Collection Time: 11/27/19  7:41 PM  Result Value Ref Range   Glucose-Capillary 152 (H) 70 - 99 mg/dL  I-stat chem 8, ED     Status: Abnormal   Collection Time: 11/27/19  7:45 PM  Result Value Ref Range   Sodium 139 135 - 145 mmol/L   Potassium 3.5 3.5 - 5.1 mmol/L   Chloride 104 98 - 111 mmol/L   BUN 13 8 - 23 mg/dL   Creatinine, Ser  0.70 0.44 - 1.00 mg/dL   Glucose, Bld 150 (H) 70 - 99 mg/dL   Calcium, Ion 1.18 1.15 - 1.40 mmol/L   TCO2 22 22 - 32 mmol/L   Hemoglobin 14.6 12.0 - 15.0 g/dL   HCT 43.0 36.0 - 46.0 %  Ethanol     Status: None   Collection Time: 11/27/19  8:16 PM  Result Value Ref Range   Alcohol, Ethyl (B) <10 <10 mg/dL  Respiratory Panel by RT PCR (Flu A&B, Covid) - Nasopharyngeal Swab     Status: None   Collection Time: 11/27/19  8:28 PM   Specimen: Nasopharyngeal Swab  Result Value Ref Range   SARS Coronavirus 2 by RT PCR NEGATIVE NEGATIVE   Influenza A by PCR NEGATIVE NEGATIVE   Influenza B by PCR NEGATIVE NEGATIVE  Urine rapid drug screen (hosp performed)     Status: None   Collection Time: 11/27/19  9:39 PM  Result Value Ref Range   Opiates NONE DETECTED NONE DETECTED   Cocaine NONE DETECTED NONE DETECTED   Benzodiazepines NONE DETECTED NONE DETECTED   Amphetamines NONE DETECTED NONE DETECTED   Tetrahydrocannabinol NONE DETECTED NONE DETECTED   Barbiturates NONE DETECTED NONE DETECTED  Urinalysis, Routine w reflex microscopic     Status: Abnormal   Collection Time: 11/27/19  9:39 PM  Result Value Ref Range   Color, Urine YELLOW YELLOW   APPearance HAZY (A) CLEAR    Specific Gravity, Urine 1.014 1.005 - 1.030   pH 7.0 5.0 - 8.0   Glucose, UA NEGATIVE NEGATIVE mg/dL   Hgb urine dipstick NEGATIVE NEGATIVE   Bilirubin Urine NEGATIVE NEGATIVE   Ketones, ur 5 (A) NEGATIVE mg/dL   Protein, ur NEGATIVE NEGATIVE mg/dL   Nitrite NEGATIVE NEGATIVE   Leukocytes,Ua LARGE (A) NEGATIVE   RBC / HPF 6-10 0 - 5 RBC/hpf   WBC, UA 21-50 0 - 5 WBC/hpf   Bacteria, UA MANY (A) NONE SEEN   Squamous Epithelial / LPF 0-5 0 - 5  Hemoglobin A1c     Status: None   Collection Time: 11/28/19  4:35 AM  Result Value Ref Range   Hgb A1c MFr Bld 4.8 4.8 - 5.6 %   Mean Plasma Glucose 91.06 mg/dL  Comprehensive metabolic panel     Status: Abnormal   Collection Time: 11/28/19  4:35 AM  Result Value Ref Range   Sodium 139 135 - 145 mmol/L   Potassium 3.5 3.5 - 5.1 mmol/L   Chloride 102 98 - 111 mmol/L   CO2 24 22 - 32 mmol/L   Glucose, Bld 155 (H) 70 - 99 mg/dL   BUN 12 8 - 23 mg/dL   Creatinine, Ser 0.81 0.44 - 1.00 mg/dL   Calcium 9.5 8.9 - 10.3 mg/dL   Total Protein 6.6 6.5 - 8.1 g/dL   Albumin 3.9 3.5 - 5.0 g/dL   AST 18 15 - 41 U/L   ALT 13 0 - 44 U/L   Alkaline Phosphatase 79 38 - 126 U/L   Total Bilirubin 0.7 0.3 - 1.2 mg/dL   GFR calc non Af Amer >60 >60 mL/min   GFR calc Af Amer >60 >60 mL/min   Anion gap 13 5 - 15  Lipid panel     Status: Abnormal   Collection Time: 11/28/19  4:35 AM  Result Value Ref Range   Cholesterol 311 (H) 0 - 200 mg/dL   Triglycerides 84 <150 mg/dL   HDL 53 >40 mg/dL   Total CHOL/HDL Ratio  5.9 RATIO   VLDL 17 0 - 40 mg/dL   LDL Cholesterol 241 (H) 0 - 99 mg/dL   EEG  Result Date: 11/28/2019 Lora Havens, MD     11/28/2019  9:51 AM Patient Name: DESMOND TUFANO MRN: [id number] Epilepsy Attending: Lora Havens Referring Physician/Provider: Dr. 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 Lennox Solders  status and slurred speech.  EEG to evaluate for seizures. Level of alertness: Awake, asleep AEDs during EEG study: Keppra Technical aspects: This EEG study was done with scalp electrodes positioned according to the 10-20 International system of electrode placement. Electrical activity was acquired at a sampling rate of 500Hz  and reviewed with a high frequency filter of 70Hz  and a low frequency filter of 1Hz . EEG data were recorded continuously and digitally stored. Description: The posterior dominant rhythm consists of 8 Hz activity of moderate voltage (25-35 uV) seen predominantly in posterior head regions, symmetric and reactive to eye opening and eye closing.  Sleep was characterized by vertex waves, sleep spindles (12 to 14 Hz), maximal frontocentral.  EEG showed continuous generalized and maximal right temporal 3 to 5 Hz theta-delta slowing.  Hyperventilation and photic stimulation were not performed. Abnormality -Continuous slow, generalized, maximal right temporal    IMPRESSION: This study showed evidence of cortical dysfunction in right temporal region likely secondary to underlying hemorrhage as well as mild diffuse encephalopathy, nonspecific etiology. No seizures or epileptiform discharges were seen throughout the recording. Lora Havens   MR Brain W and Wo Contrast  Result Date: 11/28/2019 CLINICAL DATA:  Seizure and intracranial metastatic disease. EXAM: MRI HEAD WITHOUT AND WITH CONTRAST TECHNIQUE: Multiplanar, multiecho pulse sequences of the brain and surrounding structures were obtained without and with intravenous contrast. CONTRAST:  3mL GADAVIST GADOBUTROL 1 MMOL/ML IV SOLN COMPARISON:  10/17/2019 brain MRI Head CT 11/27/2019 FINDINGS: BRAIN: Intraparenchymal hematomas within the right cerebellum and right temporal lobe unchanged. There is severe cerebellar edema but no transtentorial herniation. Small amount of subarachnoid blood over the right temporal lobe is unchanged. The white  matter signal is normal for the patient's age. No hydrocephalus. There is heterogeneous contrast enhancement associated with the right cerebellar hemorrhage. Inferior vermis lesion has increased in size and now measures 3.3 cm. A smaller lesion at the dorsal aspect of the cerebral aqua duct is decreased in size and now measures 1.2 cm. Anterior right cerebellar lesion is new or increased in size, measuring 8 mm (series 22, image 15). VASCULAR: The major intracranial arterial and venous sinus flow voids are normal. Susceptibility-sensitive sequences show no chronic microhemorrhage or superficial siderosis. SKULL AND UPPER CERVICAL SPINE: Left parietal scalp subgaleal hematoma. No focal calvarial lesions. SINUSES/ORBITS: Bilateral mastoid fluid. The orbits are normal. IMPRESSION: 1. Unchanged size of intraparenchymal hematomas within the right cerebellum and right temporal lobe. 2. Severe cerebellar edema without transtentorial or tonsillar herniation. 3. New contrast-enhancing lesions within the right cerebellar hemisphere, consistent with worsening intracranial metastatic disease. No supratentorial metastatic disease. Electronically Signed   By: Ulyses Jarred M.D.   On: 11/28/2019 03:50   DG CHEST PORT 1 VIEW  Result Date: 11/28/2019 CLINICAL DATA:  Nausea and vomiting.  History of stroke. EXAM: PORTABLE CHEST 1 VIEW COMPARISON:  CT 10/29/2019.  Chest x-ray 07/07/2019, 12/14/2015. FINDINGS: Mediastinum appears stable. Heart size stable. Stable changes of pleural-parenchymal scarring noted over the left suprahilar region consistent with prior radiation therapy. Mild  bibasilar atelectasis. No prominent pleural effusion. No pneumothorax. Surgical clips left chest. Thoracic spine scoliosis. IMPRESSION: 1. Stable changes of pleural-parenchymal scarring noted over the left suprahilar region consistent prior radiation therapy. 2.  Mild bibasilar atelectasis. Electronically Signed   By: Marcello Moores  Register   On: 11/28/2019  07:25   CT HEAD CODE STROKE WO CONTRAST  Result Date: 11/27/2019 CLINICAL DATA:  Altered mental status, slurred speech. Last known normal 16:30 EXAM: CT HEAD WITHOUT CONTRAST TECHNIQUE: Contiguous axial images were obtained from the base of the skull through the vertex without intravenous contrast. COMPARISON:  Brain MRI 10/17/2019 FINDINGS: Brain: Again demonstrated is edema within the right greater than left cerebellar hemispheres. Known metastatic lesions within the right cerebellum and midline cerebellar vermis were better appreciated on brain MRI 10/17/2019. Superimposed acute parenchymal hemorrhage within the right cerebellum. The dominant component of the hemorrhage measures 2.6 x 1.9 x 2.2 cm (series 2, image 7) (series 4, image 45). Posterior fossa mass effect appears similar to prior MRI 10/17/2019 with partial effacement of the fourth ventricle. The lateral and third ventricles are similar in size as compared to prior MRI. There is a small hemorrhagic parenchymal contusion within the inferolateral right temporal lobe with a small amount of adjacent acute subarachnoid hemorrhage (series 4, image 32) (series 2, image 13). No supratentorial midline shift. Unchanged generalized parenchymal atrophy. Vascular: No hyperdense vessel. Skull: Although not definitively identified, a left temporal bone fracture is suspected given the presence of a left mastoid effusion and adjacent subcutaneous emphysema. Sinuses/Orbits: Visualized orbits demonstrate no acute abnormality. Mild mucosal thickening within the right maxillary sinus. Left mastoid effusion. Other: There is subcutaneous gas within the left masticator/parapharyngeal space. Left posterior scalp hematoma. These results were called by telephone at the time of interpretation on 11/27/2019 at 8:02 pm to provider Dr. Lorraine Lax, who verbally acknowledged these results. IMPRESSION: 1. Prominent edema within the right greater than left cerebellar hemispheres at site  of known metastases. Superimposed acute parenchymal hemorrhage within the right cerebellum measuring 2.6 x 1.9 x 2.2 cm. Posterior fossa mass effect is similar to prior MRI with partial effacement of the fourth ventricle. 2. Small hemorrhagic contusion within the inferolateral right temporal lobe with adjacent small volume acute subarachnoid hemorrhage. 3. Although not definitively identified, an acute fracture of the left temporal bone is suspected given the presence of a left mastoid effusion and adjacent soft tissue gas within the left masticator/parapharyngeal space. 4. Left posterior scalp hematoma. Electronically Signed   By: Kellie Simmering DO   On: 11/27/2019 20:19     Assessment/Plan: Diagnosis: ICH 1. Does the need for close, 24 hr/day medical supervision in concert with the patient's rehab needs make it unreasonable for this patient to be served in a less intensive setting? Yes 2. Co-Morbidities requiring supervision/potential complications: metastatic lung cancer to cerebellar vermis, acute lower UTI, left femur fracture, recent fall, chest pain 3. Due to bladder management, bowel management, safety, skin/wound care, disease management, medication administration, pain management and patient education, does the patient require 24 hr/day rehab nursing? Yes 4. Does the patient require coordinated care of a physician, rehab nurse, therapy disciplines of PT, OT to address physical and functional deficits in the context of the above medical diagnosis(es)? Yes Addressing deficits in the following areas: balance, endurance, locomotion, strength, transferring, bowel/bladder control, bathing, dressing, feeding, grooming, toileting, cognition and psychosocial support 5. Can the patient actively participate in an intensive therapy program of at least 3 hrs of therapy per day at least  5 days per week? Yes 6. The potential for patient to make measurable gains while on inpatient rehab is  excellent 7. Anticipated functional outcomes upon discharge from inpatient rehab are modified independent  with PT, modified independent with OT, modified independent with SLP. 8. Estimated rehab length of stay to reach the above functional goals is: 10-14 days 9. Anticipated discharge destination: Home 10. Overall Rehab/Functional Prognosis: excellent  RECOMMENDATIONS: This patient's condition is appropriate for continued rehabilitative care in the following setting: CIR Patient has agreed to participate in recommended program. Yes Note that insurance prior authorization may be required for reimbursement for recommended care.  Comment: Mrs. Faughnan would be an excellent CIR candidate. Lethargic on exam but easily arousable and participates well. States that she is sleeping well at night and without pain. Not receiving sedating medications. If lethargy persists in rehabilitation, may benefit from stimulant to improve activity tolerance during the day, such as methylphenidate 5mg  daily.  Bary Leriche, PA-C 11/28/2019   I have personally performed a face to face diagnostic evaluation, including, but not limited to relevant history and physical exam findings, of this patient and developed relevant assessment and plan.  Additionally, I have reviewed and concur with the physician assistant's documentation above.  Leeroy Cha, MD

## 2019-11-28 NOTE — Evaluation (Signed)
Clinical/Bedside Swallow Evaluation Patient Details  Name: Hailey Hill MRN: [id number] Date of Birth: [birth year]  Today's Date: 11/28/2019 Time: SLP Start Time (ACUTE ONLY): 1022 SLP Stop Time (ACUTE ONLY): 1030 SLP Time Calculation (min) (ACUTE ONLY): 8 min  Past Medical History:  Past Medical History:  Diagnosis Date  . Complication of anesthesia    had complications with "gas buildup" after tubal lig  . Dyslipidemia   . History of radiation therapy 07/16/19, 07/19/19, 07/22/19   SRS radiation to 2 cerebellum targets.   . Lung cancer ([place]) dx'd 07/2010   sm cell   . Peripheral neuropathy   . SVC syndrome   . Thrombus 40/98/1191   Left basilic vein,  Left subclavian vein   Past Surgical History:  Past Surgical History:  Procedure Laterality Date  . CERVICAL BIOPSY    . CHOLECYSTECTOMY N/A 07/25/2016   Procedure: LAPAROSCOPIC CHOLECYSTECTOMY;  Surgeon: Coralie Keens, MD;  Location: [facility];  Service: General;  Laterality: N/A;  . TONSILLECTOMY    . TUBAL LIGATION     HPI:  Hailey Hill  is a 69 y.o. 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  MRI 1/21: "IMPRESSION: 1. Unchanged size of intraparenchymal hematomas within the right cerebellum and right temporal lobe. 2. Severe cerebellar edema without transtentorial or tonsillar herniation. 3. New contrast-enhancing lesions within the right cerebellar hemisphere, consistent with worsening intracranial metastatic disease. No supratentorial metastatic disease."  CXR 1/21 revealed B atelectasis, and scarring c/w prior rad tx.  Assessment / Plan / Recommendation Clinical Impression  Pt presents with functional swallow as assessed clinically.  Pt tolerated all consistencies trialed with no  clinical s/s of aspiration and exhibited good oral clearance of solids.  Recommend regular texture diet with thin liquids.  Pt has no further needs related to swallowing at this time. SLP Visit Diagnosis: Dysphagia, unspecified (R13.10)    Aspiration Risk  No limitations    Diet Recommendation Regular;Thin liquid   Liquid Administration via: Cup;Straw Medication Administration: Whole meds with liquid Supervision: Patient able to self feed Compensations: Slow rate;Small sips/bites Postural Changes: Seated upright at 90 degrees    Other  Recommendations Oral Care Recommendations: Oral care BID   Follow up Recommendations None      Frequency and Duration (N/A)          Prognosis Prognosis for Safe Diet Advancement: (N/A)      Swallow Study   General Date of Onset: 11/27/19 HPI: Hailey Hill  is a 69 y.o. 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  MRI 1/21: "IMPRESSION: 1. Unchanged size of intraparenchymal hematomas within the right cerebellum and right temporal lobe. 2. Severe cerebellar edema without transtentorial or tonsillar herniation. 3. New contrast-enhancing lesions within the right cerebellar hemisphere, consistent with worsening intracranial metastatic disease. No supratentorial metastatic disease."  CXR 1/21 revealed B atelectasis, and scarring c/w prior rad tx. Type of Study: Bedside Swallow Evaluation Previous Swallow Assessment: none Diet Prior to this Study: NPO Temperature Spikes Noted: No Respiratory Status: Room air Behavior/Cognition: Alert;Cooperative;Lethargic/Drowsy;Pleasant mood Oral Cavity Assessment: Within Functional Limits Oral Care  Completed by SLP: No Oral Cavity - Dentition: Adequate natural dentition Vision: Functional  for self-feeding Self-Feeding Abilities: Able to feed self Patient Positioning: Upright in chair Baseline Vocal Quality: Low vocal intensity Volitional Swallow: Able to elicit    Oral/Motor/Sensory Function Overall Oral Motor/Sensory Function: Mild impairment Facial ROM: Reduced right;Reduced left Facial Symmetry: Within Functional Limits Lingual ROM: Within Functional Limits Lingual Symmetry: Abnormal symmetry left Lingual Strength: Within Functional Limits Velum: Within Functional Limits Mandible: Within Functional Limits   Ice Chips Ice chips: Not tested   Thin Liquid Thin Liquid: Within functional limits Presentation: Straw    Nectar Thick Nectar Thick Liquid: Not tested   Honey Thick Honey Thick Liquid: Not tested   Puree Puree: Within functional limits Presentation: Spoon   Solid     Solid: Within functional limits Presentation: [place], [place], [place] Office: [phone]  11/28/2019,11:21 AM

## 2019-11-28 NOTE — Telephone Encounter (Signed)
Her last imaging studies of the chest abdomen and pelvis has been negative for any metastatic disease.  I will leave it up to Dr. Mickeal Skinner and Dr. Isidore Moos to decide what the next step regarding her brain metastasis.  Thank you.

## 2019-11-28 NOTE — Evaluation (Signed)
Occupational Therapy Evaluation Patient Details Name: Hailey Hill MRN: [id number] DOB: [birth year] Today's Date: 11/28/2019    History of Present Illness 68 year old female with known metastatic disease has undergone radiation treatment and stereotactic radiosurgery to multiple brain mets primarily cerebellum.  Presented with altered mental status last night showing small amount of hemorrhage around a known right cerebellar hemisphere lesion.  Vermian lesion stable additional lesions also noted that were not there on MRI scan a month ago.   Clinical Impression   Pt admitted with above and presents to OT with impairments impacting ability to complete ADLs at [facility].  Pt currently requires min assist sit > stand and mod assist +2 for safety during stand pivot transfer and short distance ambulation with RW.  Pt reports increased nausea when sitting EOB and unable to engage in dressing due to nausea and decreased motor planning.  Pt impulsive with mobility, requiring cues for safety during mobility and attempts at self-care tasks.  Pt will benefit from OT acutely and will greatly benefit from CIR level therapies to increase independence and safety with ADLs.      Follow Up Recommendations  CIR    Equipment Recommendations  Other (comment)(defer to next venue of care)    Recommendations for Other Services Rehab consult     Precautions / Restrictions Precautions Precautions: Fall Restrictions Weight Bearing Restrictions: No      Mobility Bed Mobility Overal bed mobility: Needs Assistance Bed Mobility: Supine to Sit     Supine to sit: Mod assist;+2 for safety/equipment        Transfers Overall transfer level: Needs assistance Equipment used: 2 person hand held assist;Rolling walker (2 wheeled) Transfers: Sit to/from [facility] Sit to Stand: Min assist;+2 safety/equipment Stand pivot transfers: Mod assist;+2 safety/equipment                ADL either  performed or assessed with clinical judgement   ADL Overall ADL's : Needs assistance/impaired     Grooming: Min guard;Sitting Grooming Details (indicate cue type and reason): min guard for sitting balance due to instability and decreased balance reactions             Lower Body Dressing: Maximal assistance Lower Body Dressing Details (indicate cue type and reason): Pt reports nausea when weight shifting to begin reaching towards feet, unable to complete due to nausea and decreased sustained attention to task Toilet Transfer: Moderate assistance;+2 for safety/equipment;BSC Toilet Transfer Details (indicate cue type and reason): Min assit for sit > stand difficulty with marching in place to simulate ambulation forward, therefore completed stand pivot transfer to [facility] with mod assist and +2 for safety due to decreased BLE motor control Toileting- Clothing Manipulation and Hygiene: +2 for physical assistance;Moderate assistance       Functional mobility during ADLs: +2 for safety/equipment;Moderate assistance;Minimal assistance General ADL Comments: Min assist sit> stand, Mod assist for stand pivot and short distance ambulation with RW +2 for safety     Vision Baseline Vision/History: No visual deficits Patient Visual Report: Diplopia Vision Assessment?: Vision impaired- to be further tested in functional context;Yes Eye Alignment: Impaired (comment)(ptosis in Rt eye, eye marginally deviated inward) Tracking/Visual Pursuits: Right eye does not track laterally;Decreased smoothness of horizontal tracking;Unable to hold eye position out of midline Additional Comments: reports intermittent diplopia, none reported on eval.  Nystagmus noted in supine but none present when upright            Pertinent Vitals/Pain Pain Assessment: Faces Faces Pain Scale: No  hurt     Hand Dominance Right   Extremity/Trunk Assessment Upper Extremity Assessment Upper Extremity Assessment: Generalized  weakness;LUE deficits/detail LUE Deficits / Details: Pt reports decreased ROM due to h/o breaking Lt arm   Lower Extremity Assessment Lower Extremity Assessment: Defer to PT evaluation       Communication Communication Communication: No difficulties   Cognition Arousal/Alertness: Awake/alert Behavior During Therapy: Flat affect Overall Cognitive Status: Impaired/Different from baseline Area of Impairment: Orientation                 Orientation Level: Disoriented to;Situation             General Comments: A&Ox3, not oriented to situation. Slow psychomotor processing, slightly impulsive, decreased awareness of safety/deficits.              Home Living Family/patient expects to be discharged to:: Private residence Living Arrangements: Spouse/significant other Available Help at Discharge: Family Type of Home: House Home Access: Stairs to enter Technical brewer of Steps: 4 Entrance Stairs-Rails: Right Home Layout: One level     Bathroom Shower/Tub: Tub/shower unit         Home Equipment: Grab bars - tub/shower          Prior Functioning/Environment Level of Independence: Independent                 OT Problem List: Decreased strength;Decreased range of motion;Decreased activity tolerance;Impaired balance (sitting and/or standing);Impaired vision/perception;Decreased cognition;Decreased safety awareness;Decreased knowledge of precautions      OT Treatment/Interventions: Self-care/ADL training;DME and/or AE instruction;Therapeutic activities;Energy conservation;Patient/family education    OT Goals(Current goals can be found in the care plan section) Acute Rehab OT Goals Patient Stated Goal: none stated, agreeable to therapy OT Goal Formulation: With patient Time For Goal Achievement: 12/12/19 Potential to Achieve Goals: Good  OT Frequency: Min 2X/week           Co-evaluation PT/OT/SLP Co-Evaluation/Treatment: Yes Reason for  Co-Treatment: Complexity of the patient's impairments (multi-system involvement);Necessary to address cognition/behavior during functional activity;To address functional/ADL transfers   OT goals addressed during session: ADL's and self-care      AM-PAC OT "6 Clicks" Daily Activity     Outcome Measure Help from another person eating meals?: A Little Help from another person taking care of personal grooming?: A Little Help from another person toileting, which includes using toliet, bedpan, or urinal?: A Lot Help from another person bathing (including washing, rinsing, drying)?: A Lot Help from another person to put on and taking off regular upper body clothing?: A Little Help from another person to put on and taking off regular lower body clothing?: A Lot 6 Click Score: 15   End of Session Equipment Utilized During Treatment: Gait belt;Rolling walker Nurse Communication: Mobility status  Activity Tolerance: Patient tolerated treatment well Patient left: in chair;with call bell/phone within reach;with chair alarm set  OT Visit Diagnosis: Unsteadiness on feet (R26.81);History of falling (Z91.81);Other symptoms and signs involving the nervous system (R29.898);Other symptoms and signs involving cognitive function                Time: 0936-1005 OT Time Calculation (min): 29 min Charges:  OT General Charges $OT Visit: 1 Visit OT Evaluation $OT Eval Moderate Complexity: [place], [place] 11/28/2019, 11:24 AM

## 2019-11-28 NOTE — Evaluation (Signed)
Speech Language Pathology Evaluation Patient Details Name: Hailey Hill MRN: [id number] DOB: 1951/04/26 Today's Date: 11/28/2019 Time: 1093-2355 SLP Time Calculation (min) (ACUTE ONLY): 22 min  Problem List:  Patient Active Problem List   Diagnosis Date Noted  . ICH (intracerebral hemorrhage) ([place]) 11/27/2019  . Acute lower UTI 11/27/2019  . Fall 07/10/2019  . Closed fracture of head of left humerus   . Metastatic lung cancer (metastasis from lung to other site) ([place]) 07/07/2019  . Fracture, humerus, anatomical neck, left, closed, initial encounter 07/07/2019  . Metastatic cancer to brain ([place]) 07/07/2019  . Chest pain with moderate risk for cardiac etiology 12/14/2015  . Small cell lung cancer ([place]) 09/16/2013   Past Medical History:  Past Medical History:  Diagnosis Date  . Complication of anesthesia    had complications with "gas buildup" after tubal lig  . Dyslipidemia   . History of radiation therapy 07/16/19, 07/19/19, 07/22/19   SRS radiation to 2 cerebellum targets.   . Lung cancer ([place]) dx'd 07/2010   sm cell   . Peripheral neuropathy   . SVC syndrome   . Thrombus 73/22/0254   Left basilic vein,  Left subclavian vein   Past Surgical History:  Past Surgical History:  Procedure Laterality Date  . CERVICAL BIOPSY    . CHOLECYSTECTOMY N/A 07/25/2016   Procedure: LAPAROSCOPIC CHOLECYSTECTOMY;  Surgeon: Coralie Keens, MD;  Location: [facility];  Service: General;  Laterality: N/A;  . TONSILLECTOMY    . TUBAL LIGATION     HPI:  Hailey Hill  is a 69 y.o. 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  MRI 1/21: "IMPRESSION: 1. Unchanged size of intraparenchymal hematomas within the right cerebellum and right temporal lobe. 2. Severe  cerebellar edema without transtentorial or tonsillar herniation. 3. New contrast-enhancing lesions within the right cerebellar hemisphere, consistent with worsening intracranial metastatic disease. No supratentorial metastatic disease."  CXR 1/21 revealed B atelectasis, and scarring c/w prior rad tx.   Assessment / Plan / Recommendation Clinical Impression  Pt presents with mild- moderate cognitive deficits.  Pt was assessed using the COGNISTAT (see below for additional information). There were moderated deficits in language comprehension where pt exhibited difficulty following 2 and 3 step commands.  Pt exhibited severe impairment in delayed recall task.  Pt denies that she has had any changes from baseline, and declined speech therapy.  She states she has help in the evenings and believes she would be able to care for herself during the day (e.g. make her own lunch).  Pt performed within average range on more advanced executive function tasks.  Deficits in command following may have been due to lethargy.  Pt complained of tiredness on SLP arrival and this was the final task she completed.  Pt was able to generate memory compensatory strategy x1 independently.  Pt's speech was occasionally difficult to understand at sentence/conversation level, but pt was typically able to clarify mesasge when repetition requested.  Pt noted that her speech seems slow to her, but this may again be attributable to drowsiness.  Given that pt is declining ST services at this time, recommend reassessment at next level of care.  Will defer to PT/OT for recommendations regarding level of supervision/assistance post discharge.   COGNISTAT - all subtests are within the average  range except where otherwise specified Orientation: 10/12 Attention: 7/8 Comprehension: 3/6, Moderate impariment Repetition: 11/12 Naming: 8/8 Construction: Not assessed Memory: 1/12, Severe impairment Calculations: 3/4 Similarities: 5/8 Judgment:  5/6      SLP Assessment  SLP Recommendation/Assessment: All further Speech Lanaguage Pathology  needs can be addressed in the next venue of care SLP Visit Diagnosis: Cognitive communication deficit (R41.841)    Follow Up Recommendations  None    Frequency and Duration (N/A)         SLP Evaluation Cognition  Overall Cognitive Status: No family/caregiver present to determine baseline cognitive functioning Arousal/Alertness: Awake/alert(drowsy) Orientation Level: Oriented X4 Attention: Sustained Sustained Attention: Appears intact Memory: Impaired Memory Impairment: Decreased short term memory Problem Solving: Appears intact Executive Function: Reasoning Reasoning: Appears intact       Comprehension  Auditory Comprehension Overall Auditory Comprehension: Impaired Commands: Impaired Multistep Basic Commands: 0-24% accurate Conversation: Simple Visual Recognition/Discrimination Discrimination: Not tested Reading Comprehension Reading Status: Not tested    Expression Expression Primary Mode of Expression: Verbal Verbal Expression Overall Verbal Expression: Appears within functional limits for tasks assessed Level of Generative/Spontaneous Verbalization: Sentence Repetition: No impairment Naming: No impairment Pragmatics: No impairment Written Expression Dominant Hand: Right Written Expression: Not tested   Oral / Motor  Oral Motor/Sensory Function Overall Oral Motor/Sensory Function: Mild impairment Facial ROM: Reduced right;Reduced left Facial Symmetry: Within Functional Limits Lingual ROM: Within Functional Limits Lingual Symmetry: Abnormal symmetry left Lingual Strength: Within Functional Limits Velum: Within Functional Limits Mandible: Within Functional Limits Motor Speech Overall Motor Speech: Impaired Intelligibility: Intelligibility reduced Sentence: 75-100% accurate Conversation: 75-100% accurate Motor Planning: Witnin functional limits Motor  Speech Errors: Not applicable   [place], [place], [place] Office: [phone] 11/28/2019, 11:54 AM

## 2019-11-29 ENCOUNTER — Other Ambulatory Visit: Payer: Self-pay | Admitting: Radiation Therapy

## 2019-11-29 LAB — BASIC METABOLIC PANEL
Anion gap: 12 (ref 5–15)
BUN: 19 mg/dL (ref 8–23)
CO2: 22 mmol/L (ref 22–32)
Calcium: 9.3 mg/dL (ref 8.9–10.3)
Chloride: 105 mmol/L (ref 98–111)
Creatinine, Ser: 0.81 mg/dL (ref 0.44–1.00)
GFR calc Af Amer: >60 mL/min (ref >60)
GFR calc non Af Amer: >60 mL/min (ref >60)
Glucose, Bld: 126 mg/dL — ABNORMAL HIGH (ref 70–99)
Potassium: 3.8 mmol/L (ref 3.5–5.1)
Sodium: 139 mmol/L (ref 135–145)

## 2019-11-29 LAB — CBC
HCT: 39.1 % (ref 36.0–46.0)
Hemoglobin: 13.6 g/dL (ref 12.0–15.0)
MCH: 31.1 pg (ref 26.0–34.0)
MCHC: 34.8 g/dL (ref 30.0–36.0)
MCV: 89.5 fL (ref 80.0–100.0)
Platelets: 263 10*3/uL (ref 150–400)
RBC: 4.37 MIL/uL (ref 3.87–5.11)
RDW: 12.5 % (ref 11.5–15.5)
WBC: 14.4 10*3/uL — ABNORMAL HIGH (ref 4.0–10.5)
nRBC: 0 % (ref 0.0–0.2)

## 2019-11-29 LAB — MAGNESIUM: Magnesium: 2.1 mg/dL (ref 1.7–2.4)

## 2019-11-29 MED ORDER — DEXAMETHASONE SODIUM PHOSPHATE 4 MG/ML IJ SOLN
4.0000 mg | Freq: Two times a day (BID) | INTRAMUSCULAR | Status: AC
  Administered 2019-11-29 – 2019-12-04 (×10): 4 mg via INTRAVENOUS
  Filled 2019-11-29 (×10): qty 1

## 2019-11-29 NOTE — Progress Notes (Signed)
Physical Therapy Treatment Patient Details Name: Hailey Hill MRN: [id number] DOB: [birth year] Today's Date: 11/29/2019    History of Present Illness 69 year old female with known metastatic disease has undergone radiation treatment and stereotactic radiosurgery to multiple brain mets primarily cerebellum.  Presented with altered mental status last night showing small amount of hemorrhage around a known right cerebellar hemisphere lesion.  Vermian lesion stable additional lesions also noted that were not there on MRI scan a month ago.    PT Comments    Pt eager and motivated to participate. No nausea reported today. Pt requiring two person moderate assist for transfers and ambulation up to 60 feet with use of walker. Pt displays gait abnormalities, ataxic gait, balance impairments, and decreased cognition. Continue to recommend comprehensive inpatient rehab (CIR) for post-acute therapy needs.    Follow Up Recommendations  CIR;Supervision/Assistance - 24 hour     Equipment Recommendations  Rolling walker with 5" wheels    Recommendations for Other Services Rehab consult     Precautions / Restrictions Precautions Precautions: Fall Restrictions Weight Bearing Restrictions: No    Mobility  Bed Mobility Overal bed mobility: Needs Assistance Bed Mobility: Supine to Sit     Supine to sit: Min assist     General bed mobility comments: Increased time/effort; minA for scooting left hip anteriorly to edge of bed  Transfers Overall transfer level: Needs assistance Equipment used: Rolling walker (2 wheeled) Transfers: Sit to/from Stand Sit to Stand: Mod assist;+2 safety/equipment         General transfer comment: ModA to steady with rising from edge of bed  Ambulation/Gait Ambulation/Gait assistance: Mod assist;+2 physical assistance;+2 safety/equipment Gait Distance (Feet): 60 Feet Assistive device: Rolling walker (2 wheeled) Gait Pattern/deviations: Step-through  pattern;Narrow base of support;Ataxic;Decreased stride length Gait velocity: decreased   General Gait Details: Pt requiring modA + 2 for stability, displays ataxic gait pattern, poor truncal control, inconsistent step length/width. Requires max cues for proximity to walker, controlling momentum/pace, stopping to regain balance rather than continuing forward.   Stairs             Wheelchair Mobility    Modified Rankin (Stroke Patients Only) Modified Rankin (Stroke Patients Only) Pre-Morbid Rankin Score: No symptoms Modified Rankin: Moderately severe disability     Balance Overall balance assessment: Needs assistance Sitting-balance support: Feet supported Sitting balance-Leahy Scale: Fair     Standing balance support: Single extremity supported;During functional activity Standing balance-Leahy Scale: Poor Standing balance comment: reliant on external support                            Cognition Arousal/Alertness: Awake/alert Behavior During Therapy: Flat affect;Impulsive Overall Cognitive Status: Impaired/Different from baseline Area of Impairment: Orientation;Safety/judgement;Problem solving;Awareness;Following commands                 Orientation Level: Disoriented to;Time     Following Commands: Follows one step commands inconsistently Safety/Judgement: Decreased awareness of safety;Decreased awareness of deficits Awareness: Intellectual Problem Solving: Difficulty sequencing;Requires verbal cues;Slow processing General Comments: Pt impulsive, but able to be redirected. When asked what day of the week it was, pt becoming emotionally labile stating it was the 19th and her grandon's birthday (the date was 1[birth year]). Pt very eager to participate and ambulate but does have decreased awareness of safety/deficits; needs cues for safety during mobility      Exercises      General Comments        Pertinent Vitals/Pain Pain  Assessment: Faces Faces  Pain Scale: No hurt    Home Living                      Prior Function            PT Goals (current goals can now be found in the care plan section) Acute Rehab PT Goals Patient Stated Goal: none stated, agreeable to therapy PT Goal Formulation: With patient Time For Goal Achievement: 12/12/19 Potential to Achieve Goals: Good Progress towards PT goals: Progressing toward goals    Frequency    Min 4X/week      PT Plan Current plan remains appropriate    Co-evaluation              AM-PAC PT "6 Clicks" Mobility   Outcome Measure  Help needed turning from your back to your side while in a flat bed without using bedrails?: A Little Help needed moving from lying on your back to sitting on the side of a flat bed without using bedrails?: A Lot Help needed moving to and from a bed to a chair (including a wheelchair)?: A Lot Help needed standing up from a chair using your arms (e.g., wheelchair or bedside chair)?: A Lot Help needed to walk in hospital room?: A Lot Help needed climbing 3-5 steps with a railing? : A Lot 6 Click Score: 13    End of Session Equipment Utilized During Treatment: Gait belt Activity Tolerance: Patient tolerated treatment well Patient left: in chair;with call bell/phone within reach;with chair alarm set Nurse Communication: Mobility status PT Visit Diagnosis: Unsteadiness on feet (R26.81);Difficulty in walking, not elsewhere classified (R26.2)     Time: 1021-1173 PT Time Calculation (min) (ACUTE ONLY): 17 min  Charges:  $Gait Training: 8-22 mins                     Ellamae Sia, [place], DPT Acute Rehabilitation Services Pager [phone] Office [phone]    Willy Eddy 11/29/2019, 2:11 PM

## 2019-11-29 NOTE — Progress Notes (Signed)
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 Earlie Server, oncology, who presented with complaints of nausea/vomiting found down by her husband in yard with last known normal 4:30 PM 1/20.  Since being admitted neurology, neurosurgery and neuro-oncology have been consulted and she has had an EEG done.  Has been getting steroids.  Passed swallow study.  Has been approved for CIR when medically stable and bed available.  A & P   Principal Problem:   ICH (intracerebral hemorrhage) (HCC) Active Problems:   Metastatic lung cancer (metastasis from lung to other site) [facility])   Metastatic cancer to brain ([place])   Acute lower UTI   1. Found down with confusion in setting of Small cell lung cancer with metastases to brain a. Significantly improved symptoms today with persistent slurred speech b. CT brain with small SAH c. MRI with: Severe cerebellar edema, New contrast-enhancing lesions in right cerebellar hemisphere consistent with metastatic disease d. EEG with no epileptiform discharges seen, study consistent with underlying hemorrhage and mild encephalopathy e. No role for surgery per neurosurgery, continue supportive care f. Keep SBP<160 per neurology g. Per neuro-oncology: Clinical and radiographic signs consistent with metastases and subsequent hemorrhagic conversion with progression of recently treated large midline vermis lesion contributing.  Etiology of progression is likely radial inflammatory change however organic tumor progression not ruled out.  Novel lesions best suited for radiosurgery 1 subclinical condition stabilizes.   Case to be discussed at brain/spine tumor board meeting. To discuss with Dr. Isidore Moos and possible utility of repeat MRI brain.   h. Will transition dexamethasone to 4 mg twice daily since mentation is improved then 4 mg daily after 5 days of twice daily therapy. (Change to daily on 1/27) at the advice of neuro-oncology 2. Nausea/vomiting a. Continue Zofran 3. UTI a. Continue Rocephin, last dose tomorrow to complete 3 days b. Urine culture ordered not obtained  DVT prophylaxis: SCD Diet: Regular Family Communication: Called patient's husband with no response.  He prefers to be contacted by cell phone [phone] Disposition Plan: CIR once bed available  Consultants  Neurology Neurosurgery Neuro-oncology  Procedures  EEG  Antibiotics   Anti-infectives (From admission, onward)   Start     Dose/Rate Route Frequency Ordered Stop   11/28/19 2200  cefTRIAXone (ROCEPHIN) 1 g in sodium chloride 0.9 % 100 mL IVPB     1 g 200 mL/hr over 30 Minutes Intravenous Every 24 hours 11/28/19 0129     11/28/19 0145  cefTRIAXone (ROCEPHIN) 1 g in sodium chloride 0.9 % 100 mL IVPB     1 g 200 mL/hr over 30 Minutes Intravenous  Once 11/28/19 0131 11/28/19 0226           Subjective   Evaluated at bedside no acute distress resting comfortably.  States she feels much better today and is much more coherent.  Currently denies any complaints. Objective   Vitals:   11/29/19 0405 11/29/19 0807 11/29/19 1211 11/29/19 1548  BP: 102/68 123/61 107/76 115/74  Pulse: 79 81 82 77  Resp: 17 (!) 22 20 19   Temp: 98.1 F (36.7 C) 98.7 F (37.1 C) (!) 97.4 F (36.3  C) 98.6 F (37 C)  TempSrc: Oral Oral Oral Oral  SpO2: 97% 95% 96% 96%   No intake or output data in the 24 hours ending 11/29/19 1620 There were no vitals filed for this visit.  Examination:  Physical Exam Vitals and nursing note reviewed.  Constitutional:      Appearance: Normal appearance.  HENT:     Head: Normocephalic.      Mouth/Throat:     Mouth: Mucous membranes are moist.  Eyes:     Pupils: Pupils are equal, round, and reactive to light.  Cardiovascular:     Rate and Rhythm: Normal rate and regular rhythm.  Pulmonary:     Effort: Pulmonary effort is normal. No respiratory distress.  Musculoskeletal:        General: No swelling.     Cervical back: Normal range of motion.  Skin:    Coloration: Skin is not jaundiced.  Neurological:     Mental Status: She is alert.     Comments: Dysarthria Dysmetria improved Poor coordination  Psychiatric:        Mood and Affect: Mood normal.        Behavior: Behavior normal.        Thought Content: Thought content normal.        Judgment: Judgment normal.     Data Reviewed: I have personally reviewed following labs and imaging studies  CBC: Recent Labs  Lab 11/27/19 1939 11/27/19 1945 11/29/19 0457  WBC 17.3*  --  14.4*  NEUTROABS 15.7*  --   --   HGB 14.9 14.6 13.6  HCT 44.1 43.0 39.1  MCV 91.9  --  89.5  PLT 276  --  202   Basic Metabolic Panel: Recent Labs  Lab 11/27/19 1939 11/27/19 1945 11/28/19 0435 11/29/19 0457  NA 140 139 139 139  K 3.5 3.5 3.5 3.8  CL 107 104 102 105  CO2 18*  --  24 22  GLUCOSE 158* 150* 155* 126*  BUN 12 13 12 19   CREATININE 0.94 0.70 0.81 0.81  CALCIUM 9.5  --  9.5 9.3  MG  --   --   --  2.1   GFR: CrCl cannot be calculated (Unknown ideal weight.). Liver Function Tests: Recent Labs  Lab 11/27/19 1939 11/28/19 0435  AST 19 18  ALT 14 13  ALKPHOS 76 79  BILITOT 0.7 0.7  PROT 6.7 6.6  ALBUMIN 4.1 3.9   No results for input(s): LIPASE, AMYLASE in the last 168 hours. No results for input(s): AMMONIA in the last 168 hours. Coagulation Profile: Recent Labs  Lab 11/27/19 1939  INR 1.0   Cardiac Enzymes: No results for input(s): CKTOTAL, CKMB, CKMBINDEX, TROPONINI in the last 168 hours. BNP (last 3 results) No results for input(s): PROBNP in the last 8760 hours. HbA1C: Recent Labs     11/28/19 0435  HGBA1C 4.8   CBG: Recent Labs  Lab 11/27/19 1941  GLUCAP 152*   Lipid Profile: Recent Labs    11/28/19 0435  CHOL 311*  HDL 53  LDLCALC 241*  TRIG 84  CHOLHDL 5.9   Thyroid Function Tests: No results for input(s): TSH, T4TOTAL, FREET4, T3FREE, THYROIDAB in the last 72 hours. Anemia Panel: No results for input(s): VITAMINB12, FOLATE, FERRITIN, TIBC, IRON, RETICCTPCT in the last 72 hours. Sepsis Labs: No results for input(s): PROCALCITON, LATICACIDVEN in the last 168 hours.  Recent Results (from the past 240 hour(s))  Respiratory Panel by RT PCR (Flu A&B, Covid) - Nasopharyngeal  Swab     Status: None   Collection Time: 11/27/19  8:28 PM   Specimen: Nasopharyngeal Swab  Result Value Ref Range Status   SARS Coronavirus 2 by RT PCR NEGATIVE NEGATIVE Final    Comment: (NOTE) SARS-CoV-2 target nucleic acids are NOT DETECTED. The SARS-CoV-2 RNA is generally detectable in upper respiratoy specimens during the acute phase of infection. The lowest concentration of SARS-CoV-2 viral copies this assay can detect is 131 copies/mL. A negative result does not preclude SARS-Cov-2 infection and should not be used as the sole basis for treatment or other patient management decisions. A negative result may occur with  improper specimen collection/handling, submission of specimen other than nasopharyngeal swab, presence of viral mutation(s) within the areas targeted by this assay, and inadequate number of viral copies (<131 copies/mL). A negative result must be combined with clinical observations, patient history, and epidemiological information. The expected result is Negative. Fact Sheet for Patients:  PinkCheek.be Fact Sheet for Healthcare Providers:  GravelBags.it This test is not yet ap proved or cleared by the [place] FDA and  has been authorized for detection and/or diagnosis of SARS-CoV-2 by FDA under  an Emergency Use Authorization (EUA). This EUA will remain  in effect (meaning this test can be used) for the duration of the COVID-19 declaration under Section 564(b)(1) of the Act, 21 U.S.C. section 360bbb-3(b)(1), unless the authorization is terminated or revoked sooner.    Influenza A by PCR NEGATIVE NEGATIVE Final   Influenza B by PCR NEGATIVE NEGATIVE Final    Comment: (NOTE) The Xpert Xpress SARS-CoV-2/FLU/RSV assay is intended as an aid in  the diagnosis of influenza from Nasopharyngeal swab specimens and  should not be used as a sole basis for treatment. Nasal washings and  aspirates are unacceptable for Xpert Xpress SARS-CoV-2/FLU/RSV  testing. Fact Sheet for Patients: PinkCheek.be Fact Sheet for Healthcare Providers: GravelBags.it This test is not yet approved or cleared by the [place] FDA and  has been authorized for detection and/or diagnosis of SARS-CoV-2 by  FDA under an Emergency Use Authorization (EUA). This EUA will remain  in effect (meaning this test can be used) for the duration of the  Covid-19 declaration under Section 564(b)(1) of the Act, 21  U.S.C. section 360bbb-3(b)(1), unless the authorization is  terminated or revoked. Performed at [facility], [place] [address], [place], [place] [address]          Radiology Studies: EEG  Result Date: 11/28/2019 Lora Havens, MD     11/28/2019  9:51 AM Patient Name: NANEA  MRN: [id number] Epilepsy Attending: Lora Havens Referring Physician/Provider: Dr. 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 Lennox Solders status and slurred speech.  EEG to evaluate for seizures. Level of alertness: Awake, asleep AEDs during EEG study: Keppra Technical aspects: This EEG study was done with scalp electrodes positioned  according to the 10-20 International system of electrode placement. Electrical activity was acquired at a sampling rate of 500Hz  and reviewed with a high frequency filter of 70Hz  and a low frequency filter of 1Hz . EEG data were recorded continuously and digitally stored. Description: The posterior dominant rhythm consists of 8 Hz activity of moderate voltage (25-35 uV) seen predominantly in posterior head regions, symmetric and reactive to eye opening and eye closing.  Sleep was characterized by vertex waves, sleep spindles (12 to 14 Hz), maximal frontocentral.  EEG  showed continuous generalized and maximal right temporal 3 to 5 Hz theta-delta slowing.  Hyperventilation and photic stimulation were not performed. Abnormality -Continuous slow, generalized, maximal right temporal    IMPRESSION: This study showed evidence of cortical dysfunction in right temporal region likely secondary to underlying hemorrhage as well as mild diffuse encephalopathy, nonspecific etiology. No seizures or epileptiform discharges were seen throughout the recording. Lora Havens   MR Brain W and Wo Contrast  Result Date: 11/28/2019 CLINICAL DATA:  Seizure and intracranial metastatic disease. EXAM: MRI HEAD WITHOUT AND WITH CONTRAST TECHNIQUE: Multiplanar, multiecho pulse sequences of the brain and surrounding structures were obtained without and with intravenous contrast. CONTRAST:  72mL GADAVIST GADOBUTROL 1 MMOL/ML IV SOLN COMPARISON:  10/17/2019 brain MRI Head CT 11/27/2019 FINDINGS: BRAIN: Intraparenchymal hematomas within the right cerebellum and right temporal lobe unchanged. There is severe cerebellar edema but no transtentorial herniation. Small amount of subarachnoid blood over the right temporal lobe is unchanged. The white matter signal is normal for the patient's age. No hydrocephalus. There is heterogeneous contrast enhancement associated with the right cerebellar hemorrhage. Inferior vermis lesion has increased in size  and now measures 3.3 cm. A smaller lesion at the dorsal aspect of the cerebral aqua duct is decreased in size and now measures 1.2 cm. Anterior right cerebellar lesion is new or increased in size, measuring 8 mm (series 22, image 15). VASCULAR: The major intracranial arterial and venous sinus flow voids are normal. Susceptibility-sensitive sequences show no chronic microhemorrhage or superficial siderosis. SKULL AND UPPER CERVICAL SPINE: Left parietal scalp subgaleal hematoma. No focal calvarial lesions. SINUSES/ORBITS: Bilateral mastoid fluid. The orbits are normal. IMPRESSION: 1. Unchanged size of intraparenchymal hematomas within the right cerebellum and right temporal lobe. 2. Severe cerebellar edema without transtentorial or tonsillar herniation. 3. New contrast-enhancing lesions within the right cerebellar hemisphere, consistent with worsening intracranial metastatic disease. No supratentorial metastatic disease. Electronically Signed   By: Ulyses Jarred M.D.   On: 11/28/2019 03:50   DG CHEST PORT 1 VIEW  Result Date: 11/28/2019 CLINICAL DATA:  Nausea and vomiting.  History of stroke. EXAM: PORTABLE CHEST 1 VIEW COMPARISON:  CT 10/29/2019.  Chest x-ray 07/07/2019, 12/14/2015. FINDINGS: Mediastinum appears stable. Heart size stable. Stable changes of pleural-parenchymal scarring noted over the left suprahilar region consistent with prior radiation therapy. Mild bibasilar atelectasis. No prominent pleural effusion. No pneumothorax. Surgical clips left chest. Thoracic spine scoliosis. IMPRESSION: 1. Stable changes of pleural-parenchymal scarring noted over the left suprahilar region consistent prior radiation therapy. 2.  Mild bibasilar atelectasis. Electronically Signed   By: Marcello Moores  Register   On: 11/28/2019 07:25   CT HEAD CODE STROKE WO CONTRAST  Result Date: 11/27/2019 CLINICAL DATA:  Altered mental status, slurred speech. Last known normal 16:30 EXAM: CT HEAD WITHOUT CONTRAST TECHNIQUE: Contiguous  axial images were obtained from the base of the skull through the vertex without intravenous contrast. COMPARISON:  Brain MRI 10/17/2019 FINDINGS: Brain: Again demonstrated is edema within the right greater than left cerebellar hemispheres. Known metastatic lesions within the right cerebellum and midline cerebellar vermis were better appreciated on brain MRI 10/17/2019. Superimposed acute parenchymal hemorrhage within the right cerebellum. The dominant component of the hemorrhage measures 2.6 x 1.9 x 2.2 cm (series 2, image 7) (series 4, image 45). Posterior fossa mass effect appears similar to prior MRI 10/17/2019 with partial effacement of the fourth ventricle. The lateral and third ventricles are similar in size as compared to prior MRI. There is a small hemorrhagic parenchymal contusion  within the inferolateral right temporal lobe with a small amount of adjacent acute subarachnoid hemorrhage (series 4, image 32) (series 2, image 13). No supratentorial midline shift. Unchanged generalized parenchymal atrophy. Vascular: No hyperdense vessel. Skull: Although not definitively identified, a left temporal bone fracture is suspected given the presence of a left mastoid effusion and adjacent subcutaneous emphysema. Sinuses/Orbits: Visualized orbits demonstrate no acute abnormality. Mild mucosal thickening within the right maxillary sinus. Left mastoid effusion. Other: There is subcutaneous gas within the left masticator/parapharyngeal space. Left posterior scalp hematoma. These results were called by telephone at the time of interpretation on 11/27/2019 at 8:02 pm to provider Dr. Lorraine Lax, who verbally acknowledged these results. IMPRESSION: 1. Prominent edema within the right greater than left cerebellar hemispheres at site of known metastases. Superimposed acute parenchymal hemorrhage within the right cerebellum measuring 2.6 x 1.9 x 2.2 cm. Posterior fossa mass effect is similar to prior MRI with partial effacement of  the fourth ventricle. 2. Small hemorrhagic contusion within the inferolateral right temporal lobe with adjacent small volume acute subarachnoid hemorrhage. 3. Although not definitively identified, an acute fracture of the left temporal bone is suspected given the presence of a left mastoid effusion and adjacent soft tissue gas within the left masticator/parapharyngeal space. 4. Left posterior scalp hematoma. Electronically Signed   By: Kellie Simmering DO   On: 11/27/2019 20:19        Scheduled Meds: . dexamethasone (DECADRON) injection  4 mg Intravenous BID  . levETIRAcetam  500 mg Oral BID   Continuous Infusions: . sodium chloride 250 mL (11/28/19 2204)  . cefTRIAXone (ROCEPHIN)  IV 1 g (11/28/19 2207)     LOS: 2 days    Time spent: 25 minutes with over 50% of the time coordinating the patient's care    Harold Hedge, DO [facility] Pager [phone]  If 7PM-7AM, please contact night-coverage [email or web address] Password [email or web address] 11/29/2019, 4:20 PM

## 2019-11-29 NOTE — Progress Notes (Signed)
Inpatient Rehab Admissions:  Inpatient Rehab Consult received.  I met with patient at the bedside for rehabilitation assessment and to discuss goals and expectations of an inpatient rehab admission.  She is interested in CIR to regain some function before returning home. I have no beds available for this patient through the weekend.  I will f/u with her and her husband on Monday, if pt remains in house, to discuss dispo and support at discharge.   Signed: Shann Medal, PT, DPT Admissions Coordinator [phone] 11/29/19  2:21 PM

## 2019-11-30 DIAGNOSIS — K5900 Constipation, unspecified: Secondary | ICD-10-CM

## 2019-11-30 LAB — CBC
HCT: 39.5 % (ref 36.0–46.0)
Hemoglobin: 13.5 g/dL (ref 12.0–15.0)
MCH: 30.9 pg (ref 26.0–34.0)
MCHC: 34.2 g/dL (ref 30.0–36.0)
MCV: 90.4 fL (ref 80.0–100.0)
Platelets: 255 10*3/uL (ref 150–400)
RBC: 4.37 MIL/uL (ref 3.87–5.11)
RDW: 12.5 % (ref 11.5–15.5)
WBC: 11.9 10*3/uL — ABNORMAL HIGH (ref 4.0–10.5)
nRBC: 0 % (ref 0.0–0.2)

## 2019-11-30 LAB — BASIC METABOLIC PANEL
Anion gap: 10 (ref 5–15)
BUN: 17 mg/dL (ref 8–23)
CO2: 23 mmol/L (ref 22–32)
Calcium: 9.2 mg/dL (ref 8.9–10.3)
Chloride: 107 mmol/L (ref 98–111)
Creatinine, Ser: 0.82 mg/dL (ref 0.44–1.00)
GFR calc Af Amer: >60 mL/min (ref >60)
GFR calc non Af Amer: >60 mL/min (ref >60)
Glucose, Bld: 121 mg/dL — ABNORMAL HIGH (ref 70–99)
Potassium: 4.1 mmol/L (ref 3.5–5.1)
Sodium: 140 mmol/L (ref 135–145)

## 2019-11-30 LAB — MAGNESIUM: Magnesium: 2.3 mg/dL (ref 1.7–2.4)

## 2019-11-30 MED ORDER — DOCUSATE SODIUM 100 MG PO CAPS
100.0000 mg | ORAL_CAPSULE | Freq: Every day | ORAL | Status: DC
  Administered 2019-11-30 – 2019-12-04 (×5): 100 mg via ORAL
  Filled 2019-11-30 (×5): qty 1

## 2019-11-30 NOTE — Progress Notes (Signed)
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 Earlie Server, oncology, who presented with complaints of nausea/vomiting found down by her husband in yard with last known normal 4:30 PM 1/20.  Since being admitted neurology, neurosurgery and neuro-oncology have been consulted and she has had an EEG done.  Has been getting steroids.  Passed swallow study.  Has been approved for CIR when medically stable and bed available.  A & P   Principal Problem:   ICH (intracerebral hemorrhage) (HCC) Active Problems:   Metastatic lung cancer (metastasis from lung to other site) [facility])   Metastatic cancer to brain ([place])   Acute lower UTI   1. Found down with confusion in setting of Small cell lung cancer with metastases to brain a. Continues to improve but still dysarthric b. CT brain with small SAH c. MRI with: Severe cerebellar edema, New contrast-enhancing lesions in right cerebellar hemisphere consistent with metastatic disease d. EEG with no epileptiform discharges seen, study consistent with underlying hemorrhage and mild encephalopathy e. No role for surgery per neurosurgery, continue supportive care f. Keep SBP<160 per neurology g. Per neuro-oncology: Clinical and radiographic signs consistent with metastases and subsequent hemorrhagic conversion with progression of recently treated large midline vermis lesion contributing.  Etiology of progression is likely radial inflammatory change however organic tumor progression not ruled out.  Novel lesions best suited for radiosurgery 1 subclinical condition stabilizes.  Case to be discussed at  brain/spine tumor board meeting. To discuss with Dr. Isidore Moos and possible utility of repeat MRI brain.   h. OOB i. Transitioned to dexamethasone to 4 mg twice daily on 1/22 since mentation improved then 4 mg daily after 5 days of twice daily therapy. (Change to daily on 1/27) at the advice of neuro-oncology 2. Constipation a. Bowel regimen 3. Nausea/vomiting - resolved a. Continue Zofran as needed 4. UTI a. Completed 3 days rocephin b. Urine culture ordered not obtained  DVT prophylaxis: SCD Diet: Regular Family Communication: Updated patient's husband.  He prefers to be contacted by cell phone [phone] Disposition Plan: Continue IV steroids, to CIR once bed available hopefully Monday  Consultants  Neurology Neurosurgery Neuro-oncology  Procedures  EEG  Antibiotics   Anti-infectives (From admission, onward)   Start     Dose/Rate Route Frequency Ordered Stop   11/28/19 2200  cefTRIAXone (ROCEPHIN) 1 g in sodium chloride 0.9 % 100 mL IVPB     1 g 200 mL/hr over 30 Minutes Intravenous Every 24 hours 11/28/19 0129     11/28/19 0145  cefTRIAXone (ROCEPHIN) 1 g in sodium chloride 0.9 % 100 mL IVPB     1 g 200 mL/hr over 30 Minutes Intravenous  Once 11/28/19 0131 11/28/19 0226           Subjective   Complains of constipation and wishes to get out of the bed and at least sit in the chair. Otherwise no complaints and doing well.   Objective   Vitals:   11/30/19 0419 11/30/19 0429 11/30/19 0820 11/30/19 1100  BP: (!) 120/58 124/77 119/65 134/75  Pulse: 67 66 75 67  Resp: 18  (!) 22 18  Temp: 98.4 F (  36.9 C)  (!) 97.5 F (36.4 C) 97.7 F (36.5 C)  TempSrc: Oral  Oral Oral  SpO2: 97%  98% 97%    Intake/Output Summary (Last 24 hours) at 11/30/2019 1437 Last data filed at 11/29/2019 1636 Gross per 24 hour  Intake 90 ml  Output --  Net 90 ml   There were no vitals filed for this visit.  Examination:  Physical Exam Vitals and nursing note reviewed.   Constitutional:      Appearance: She is not ill-appearing.  HENT:     Head: Normocephalic.     Mouth/Throat:     Mouth: Mucous membranes are moist.  Eyes:     Conjunctiva/sclera: Conjunctivae normal.  Cardiovascular:     Rate and Rhythm: Normal rate and regular rhythm.  Pulmonary:     Effort: Pulmonary effort is normal.     Breath sounds: Normal breath sounds.  Abdominal:     General: Abdomen is flat. There is no distension.  Musculoskeletal:        General: No swelling or tenderness.     Cervical back: No rigidity.  Neurological:     Mental Status: She is alert and oriented to person, place, and time.     Comments: dysarthria  Psychiatric:        Mood and Affect: Mood normal.        Behavior: Behavior normal.        Thought Content: Thought content normal.        Judgment: Judgment normal.     Data Reviewed: I have personally reviewed following labs and imaging studies  CBC: Recent Labs  Lab 11/27/19 1939 11/27/19 1945 11/29/19 0457 11/30/19 0244  WBC 17.3*  --  14.4* 11.9*  NEUTROABS 15.7*  --   --   --   HGB 14.9 14.6 13.6 13.5  HCT 44.1 43.0 39.1 39.5  MCV 91.9  --  89.5 90.4  PLT 276  --  263 858   Basic Metabolic Panel: Recent Labs  Lab 11/27/19 1939 11/27/19 1945 11/28/19 0435 11/29/19 0457 11/30/19 0244  NA 140 139 139 139 140  K 3.5 3.5 3.5 3.8 4.1  CL 107 104 102 105 107  CO2 18*  --  24 22 23   GLUCOSE 158* 150* 155* 126* 121*  BUN 12 13 12 19 17   CREATININE 0.94 0.70 0.81 0.81 0.82  CALCIUM 9.5  --  9.5 9.3 9.2  MG  --   --   --  2.1 2.3   GFR: CrCl cannot be calculated (Unknown ideal weight.). Liver Function Tests: Recent Labs  Lab 11/27/19 1939 11/28/19 0435  AST 19 18  ALT 14 13  ALKPHOS 76 79  BILITOT 0.7 0.7  PROT 6.7 6.6  ALBUMIN 4.1 3.9   No results for input(s): LIPASE, AMYLASE in the last 168 hours. No results for input(s): AMMONIA in the last 168 hours. Coagulation Profile: Recent Labs  Lab 11/27/19 1939  INR  1.0   Cardiac Enzymes: No results for input(s): CKTOTAL, CKMB, CKMBINDEX, TROPONINI in the last 168 hours. BNP (last 3 results) No results for input(s): PROBNP in the last 8760 hours. HbA1C: Recent Labs    11/28/19 0435  HGBA1C 4.8   CBG: Recent Labs  Lab 11/27/19 1941  GLUCAP 152*   Lipid Profile: Recent Labs    11/28/19 0435  CHOL 311*  HDL 53  LDLCALC 241*  TRIG 84  CHOLHDL 5.9   Thyroid Function Tests: No results for input(s): TSH, T4TOTAL,  FREET4, T3FREE, THYROIDAB in the last 72 hours. Anemia Panel: No results for input(s): VITAMINB12, FOLATE, FERRITIN, TIBC, IRON, RETICCTPCT in the last 72 hours. Sepsis Labs: No results for input(s): PROCALCITON, LATICACIDVEN in the last 168 hours.  Recent Results (from the past 240 hour(s))  Respiratory Panel by RT PCR (Flu A&B, Covid) - Nasopharyngeal Swab     Status: None   Collection Time: 11/27/19  8:28 PM   Specimen: Nasopharyngeal Swab  Result Value Ref Range Status   SARS Coronavirus 2 by RT PCR NEGATIVE NEGATIVE Final    Comment: (NOTE) SARS-CoV-2 target nucleic acids are NOT DETECTED. The SARS-CoV-2 RNA is generally detectable in upper respiratoy specimens during the acute phase of infection. The lowest concentration of SARS-CoV-2 viral copies this assay can detect is 131 copies/mL. A negative result does not preclude SARS-Cov-2 infection and should not be used as the sole basis for treatment or other patient management decisions. A negative result may occur with  improper specimen collection/handling, submission of specimen other than nasopharyngeal swab, presence of viral mutation(s) within the areas targeted by this assay, and inadequate number of viral copies (<131 copies/mL). A negative result must be combined with clinical observations, patient history, and epidemiological information. The expected result is Negative. Fact Sheet for Patients:  PinkCheek.be Fact Sheet for  Healthcare Providers:  GravelBags.it This test is not yet ap proved or cleared by the [place] FDA and  has been authorized for detection and/or diagnosis of SARS-CoV-2 by FDA under an Emergency Use Authorization (EUA). This EUA will remain  in effect (meaning this test can be used) for the duration of the COVID-19 declaration under Section 564(b)(1) of the Act, 21 U.S.C. section 360bbb-3(b)(1), unless the authorization is terminated or revoked sooner.    Influenza A by PCR NEGATIVE NEGATIVE Final   Influenza B by PCR NEGATIVE NEGATIVE Final    Comment: (NOTE) The Xpert Xpress SARS-CoV-2/FLU/RSV assay is intended as an aid in  the diagnosis of influenza from Nasopharyngeal swab specimens and  should not be used as a sole basis for treatment. Nasal washings and  aspirates are unacceptable for Xpert Xpress SARS-CoV-2/FLU/RSV  testing. Fact Sheet for Patients: PinkCheek.be Fact Sheet for Healthcare Providers: GravelBags.it This test is not yet approved or cleared by the [place] FDA and  has been authorized for detection and/or diagnosis of SARS-CoV-2 by  FDA under an Emergency Use Authorization (EUA). This EUA will remain  in effect (meaning this test can be used) for the duration of the  Covid-19 declaration under Section 564(b)(1) of the Act, 21  U.S.C. section 360bbb-3(b)(1), unless the authorization is  terminated or revoked. Performed at [facility], [place] [address] [place], [place] [address]          Radiology Studies: No results found.      Scheduled Meds: . dexamethasone (DECADRON) injection  4 mg Intravenous BID  . docusate sodium  100 mg Oral Daily  . levETIRAcetam  500 mg Oral BID   Continuous Infusions: . sodium chloride 250 mL (11/29/19 2036)  . cefTRIAXone (ROCEPHIN)  IV 1 g (11/29/19 2120)     LOS: 3 days    Time spent: 20 minutes with over 50%  of the time coordinating the patient's care    Harold Hedge, DO [facility] Pager [phone]  If 7PM-7AM, please contact night-coverage [email or web address] Password [facility] 11/30/2019, 2:37 PM

## 2019-12-01 NOTE — Progress Notes (Signed)
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 Earlie Server, oncology, who presented with complaints of nausea/vomiting found down by her husband in yard with last known normal 4:30 PM 1/20.  Since being admitted neurology, neurosurgery and neuro-oncology have been consulted and she has had an EEG done.  Has been getting steroids.  Passed swallow study.  Has been approved for CIR when medically stable and bed available.  A & P   Principal Problem:   ICH (intracerebral hemorrhage) (HCC) Active Problems:   Metastatic lung cancer (metastasis from lung to other site) [facility])   Metastatic cancer to brain ([place])   Acute lower UTI   1. Found down with confusion in setting of Small cell lung cancer with metastases to brain a. Significantly improved from admission but still ataxic gait per nursing b. CT brain with small SAH c. MRI with: Severe cerebellar edema, New contrast-enhancing lesions in right cerebellar hemisphere consistent with metastatic disease d. EEG with no epileptiform discharges seen, study consistent with underlying hemorrhage and mild encephalopathy e. No role for surgery per neurosurgery, continue supportive care f. Keep SBP<160 per neurology g. Per neuro-oncology: Clinical and radiographic signs consistent with metastases and subsequent hemorrhagic conversion with progression of recently treated large midline vermis lesion contributing.  Etiology of progression is likely radial inflammatory change however organic tumor progression not ruled out.  Novel lesions best suited for radiosurgery 1 subclinical condition  stabilizes.  Case to be discussed at brain/spine tumor board meeting. To discuss with Dr. Isidore Moos and possible utility of repeat MRI brain.   h. OOB i. Transitioned to dexamethasone to 4 mg twice daily on 1/22 since mentation improved then 4 mg daily after 5 days of twice daily therapy. (Change to daily on 1/27) at the advice of neuro-oncology 2. Constipation a. Bowel regimen 3. Nausea/vomiting - resolved a. Continue Zofran as needed 4. UTI a. Completed 3 days rocephin b. Urine culture ordered not obtained  DVT prophylaxis: SCD Diet: Regular Family Communication: Updated patient's husband yesterday.  He prefers to be contacted by cell phone [phone] Disposition Plan: Continue IV steroids, medically stable for discharge to CIR once bed available hopefully Monday  Consultants  Neurology Neurosurgery Neuro-oncology  Procedures  EEG  Antibiotics   Anti-infectives (From admission, onward)   Start     Dose/Rate Route Frequency Ordered Stop   11/28/19 2200  cefTRIAXone (ROCEPHIN) 1 g in sodium chloride 0.9 % 100 mL IVPB  Status:  Discontinued     1 g 200 mL/hr over 30 Minutes Intravenous Every 24 hours 11/28/19 0129 11/30/19 1441   11/28/19 0145  cefTRIAXone (ROCEPHIN) 1 g in sodium chloride 0.9 % 100 mL IVPB     1 g 200 mL/hr over 30 Minutes Intravenous  Once 11/28/19 0131 11/28/19 0226           Subjective   Complains that she still has not been out of the bed to ambulate due to limited staffing.  Complains of diplopia on occasion.  Otherwise denies any complaints. Objective   Vitals:   12/01/19 0346 12/01/19 0700 12/01/19 1100 12/01/19 1500  BP: 113/63 108/65 123/81 114/67  Pulse: Marland Kitchen)  57 69 70 64  Resp: 17 18 20 18   Temp: 97.8 F (36.6 C) 98.2 F (36.8 C) 98.2 F (36.8 C) 98.4 F (36.9 C)  TempSrc: Oral Oral Oral Oral  SpO2: 99% 99% 97% 98%    Intake/Output Summary (Last 24 hours) at 12/01/2019 1720 Last data filed at 12/01/2019 4196 Gross per 24 hour   Intake 240 ml  Output --  Net 240 ml   There were no vitals filed for this visit.  Examination:  Physical Exam Vitals and nursing note reviewed.  Constitutional:      Appearance: She is not ill-appearing.  HENT:     Head: Normocephalic.  Eyes:     Conjunctiva/sclera: Conjunctivae normal.  Cardiovascular:     Rate and Rhythm: Normal rate and regular rhythm.  Pulmonary:     Effort: Pulmonary effort is normal.     Breath sounds: Normal breath sounds.  Abdominal:     General: Bowel sounds are normal. There is no distension.  Musculoskeletal:        General: No swelling or tenderness.     Cervical back: No rigidity.  Skin:    General: Skin is warm.     Coloration: Skin is not pale.  Neurological:     Mental Status: She is alert.     Comments: Dysarthria   Psychiatric:        Mood and Affect: Mood normal.        Behavior: Behavior normal.     Data Reviewed: I have personally reviewed following labs and imaging studies  CBC: Recent Labs  Lab 11/27/19 1939 11/27/19 1945 11/29/19 0457 11/30/19 0244  WBC 17.3*  --  14.4* 11.9*  NEUTROABS 15.7*  --   --   --   HGB 14.9 14.6 13.6 13.5  HCT 44.1 43.0 39.1 39.5  MCV 91.9  --  89.5 90.4  PLT 276  --  263 222   Basic Metabolic Panel: Recent Labs  Lab 11/27/19 1939 11/27/19 1945 11/28/19 0435 11/29/19 0457 11/30/19 0244  NA 140 139 139 139 140  K 3.5 3.5 3.5 3.8 4.1  CL 107 104 102 105 107  CO2 18*  --  24 22 23   GLUCOSE 158* 150* 155* 126* 121*  BUN 12 13 12 19 17   CREATININE 0.94 0.70 0.81 0.81 0.82  CALCIUM 9.5  --  9.5 9.3 9.2  MG  --   --   --  2.1 2.3   GFR: CrCl cannot be calculated (Unknown ideal weight.). Liver Function Tests: Recent Labs  Lab 11/27/19 1939 11/28/19 0435  AST 19 18  ALT 14 13  ALKPHOS 76 79  BILITOT 0.7 0.7  PROT 6.7 6.6  ALBUMIN 4.1 3.9   No results for input(s): LIPASE, AMYLASE in the last 168 hours. No results for input(s): AMMONIA in the last 168  hours. Coagulation Profile: Recent Labs  Lab 11/27/19 1939  INR 1.0   Cardiac Enzymes: No results for input(s): CKTOTAL, CKMB, CKMBINDEX, TROPONINI in the last 168 hours. BNP (last 3 results) No results for input(s): PROBNP in the last 8760 hours. HbA1C: No results for input(s): HGBA1C in the last 72 hours. CBG: Recent Labs  Lab 11/27/19 1941  GLUCAP 152*   Lipid Profile: No results for input(s): CHOL, HDL, LDLCALC, TRIG, CHOLHDL, LDLDIRECT in the last 72 hours. Thyroid Function Tests: No results for input(s): TSH, T4TOTAL, FREET4, T3FREE, THYROIDAB in the last 72 hours. Anemia Panel: No results for input(s): VITAMINB12, FOLATE, FERRITIN, TIBC,  IRON, RETICCTPCT in the last 72 hours. Sepsis Labs: No results for input(s): PROCALCITON, LATICACIDVEN in the last 168 hours.  Recent Results (from the past 240 hour(s))  Respiratory Panel by RT PCR (Flu A&B, Covid) - Nasopharyngeal Swab     Status: None   Collection Time: 11/27/19  8:28 PM   Specimen: Nasopharyngeal Swab  Result Value Ref Range Status   SARS Coronavirus 2 by RT PCR NEGATIVE NEGATIVE Final    Comment: (NOTE) SARS-CoV-2 target nucleic acids are NOT DETECTED. The SARS-CoV-2 RNA is generally detectable in upper respiratoy specimens during the acute phase of infection. The lowest concentration of SARS-CoV-2 viral copies this assay can detect is 131 copies/mL. A negative result does not preclude SARS-Cov-2 infection and should not be used as the sole basis for treatment or other patient management decisions. A negative result may occur with  improper specimen collection/handling, submission of specimen other than nasopharyngeal swab, presence of viral mutation(s) within the areas targeted by this assay, and inadequate number of viral copies (<131 copies/mL). A negative result must be combined with clinical observations, patient history, and epidemiological information. The expected result is Negative. Fact Sheet for  Patients:  PinkCheek.be Fact Sheet for Healthcare Providers:  GravelBags.it This test is not yet ap proved or cleared by the [place] FDA and  has been authorized for detection and/or diagnosis of SARS-CoV-2 by FDA under an Emergency Use Authorization (EUA). This EUA will remain  in effect (meaning this test can be used) for the duration of the COVID-19 declaration under Section 564(b)(1) of the Act, 21 U.S.C. section 360bbb-3(b)(1), unless the authorization is terminated or revoked sooner.    Influenza A by PCR NEGATIVE NEGATIVE Final   Influenza B by PCR NEGATIVE NEGATIVE Final    Comment: (NOTE) The Xpert Xpress SARS-CoV-2/FLU/RSV assay is intended as an aid in  the diagnosis of influenza from Nasopharyngeal swab specimens and  should not be used as a sole basis for treatment. Nasal washings and  aspirates are unacceptable for Xpert Xpress SARS-CoV-2/FLU/RSV  testing. Fact Sheet for Patients: PinkCheek.be Fact Sheet for Healthcare Providers: GravelBags.it This test is not yet approved or cleared by the [place] FDA and  has been authorized for detection and/or diagnosis of SARS-CoV-2 by  FDA under an Emergency Use Authorization (EUA). This EUA will remain  in effect (meaning this test can be used) for the duration of the  Covid-19 declaration under Section 564(b)(1) of the Act, 21  U.S.C. section 360bbb-3(b)(1), unless the authorization is  terminated or revoked. Performed at [facility], [place] [address], [place], [place] [address]          Radiology Studies: No results found.      Scheduled Meds: . dexamethasone (DECADRON) injection  4 mg Intravenous BID  . docusate sodium  100 mg Oral Daily  . levETIRAcetam  500 mg Oral BID   Continuous Infusions: . sodium chloride 250 mL (11/29/19 2036)     LOS: 4 days    Time spent: 18  minutes with over 50% of the time coordinating the patient's care    Harold Hedge, DO [facility] Pager [phone]  If 7PM-7AM, please contact night-coverage [email or web address] Password [email or web address] 12/01/2019, 5:20 PM

## 2019-12-01 NOTE — Progress Notes (Addendum)
Pt up to chair X 4 hours. Ambulated to nurse's station and back to room (60 feet) with front wheel walker, gait belt, and 2 assist. Right foot ataxic, causing pt to get off balance a few times, requiring 2 people to assist during ambulation. Pt had bath today. Will continue to monitor.

## 2019-12-02 LAB — BASIC METABOLIC PANEL
Anion gap: 13 (ref 5–15)
BUN: 25 mg/dL — ABNORMAL HIGH (ref 8–23)
CO2: 23 mmol/L (ref 22–32)
Calcium: 9.4 mg/dL (ref 8.9–10.3)
Chloride: 103 mmol/L (ref 98–111)
Creatinine, Ser: 0.82 mg/dL (ref 0.44–1.00)
GFR calc Af Amer: >60 mL/min (ref >60)
GFR calc non Af Amer: >60 mL/min (ref >60)
Glucose, Bld: 110 mg/dL — ABNORMAL HIGH (ref 70–99)
Potassium: 4.5 mmol/L (ref 3.5–5.1)
Sodium: 139 mmol/L (ref 135–145)

## 2019-12-02 LAB — CBC
HCT: 43.6 % (ref 36.0–46.0)
Hemoglobin: 15 g/dL (ref 12.0–15.0)
MCH: 30.5 pg (ref 26.0–34.0)
MCHC: 34.4 g/dL (ref 30.0–36.0)
MCV: 88.8 fL (ref 80.0–100.0)
Platelets: 271 10*3/uL (ref 150–400)
RBC: 4.91 MIL/uL (ref 3.87–5.11)
RDW: 12.3 % (ref 11.5–15.5)
WBC: 10.1 10*3/uL (ref 4.0–10.5)
nRBC: 0 % (ref 0.0–0.2)

## 2019-12-02 NOTE — Social Work (Signed)
CSW contacted by Medical Director Dr. Rockne Menghini in regards to pt discharge planning. Per notes CIR to f/u today, have reached out to CIR admissions liaison to see where process is at- will also obtain permission to discuss other discharge plans as CIR beds remain tight.   Westley Hummer, MSW, [place] [employer]

## 2019-12-02 NOTE — Progress Notes (Signed)
Inpatient Rehab Admissions Coordinator:   Spoke with pt's spouse and her son over the phone.  Both are hopeful for CIR and are aware that pt will need 24/7 supervision at discharge from CIR.  Will open insurance for possible admission this week pending bed availability and insurance approval.   Shann Medal, PT, DPT Admissions Coordinator [phone] 12/02/19  3:28 PM

## 2019-12-02 NOTE — Progress Notes (Signed)
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 Earlie Server, oncology, who presented with complaints of nausea/vomiting found down by her husband in yard with last known normal 4:30 PM 1/20.  Since being admitted neurology, neurosurgery and neuro-oncology have been consulted and she has had an EEG done.  Has been getting steroids.  Passed swallow study.  Has been approved for CIR when medically stable and bed available.  A & P   Principal Problem:   ICH (intracerebral hemorrhage) (HCC) Active Problems:   Metastatic lung cancer (metastasis from lung to other site) [facility])   Metastatic cancer to brain ([place])   Acute lower UTI   1. Found down with confusion in setting of Small cell lung cancer with metastases to brain a. Continues to improve from admission b. CT brain with small SAH c. MRI with: Severe cerebellar edema, New contrast-enhancing lesions in right cerebellar hemisphere consistent with metastatic disease d. EEG with no epileptiform discharges seen, study consistent with underlying hemorrhage and mild encephalopathy e. No role for surgery per neurosurgery, continue supportive care f. Keep SBP<160 per neurology g. Per neuro-oncology: Clinical and radiographic signs consistent with metastases and subsequent hemorrhagic conversion with progression of recently treated large midline vermis lesion contributing.  Etiology of progression is likely radial inflammatory change however organic tumor progression not ruled out.  Novel lesions best suited for radiosurgery 1 subclinical condition stabilizes.  Case to be discussed at  brain/spine tumor board meeting. To discuss with Dr. Isidore Moos and possible utility of repeat MRI brain.   h. OOB i. Transitioned to dexamethasone to 4 mg twice daily on 1/22 since mentation improved then 4 mg daily after 5 days of twice daily therapy. (Change to daily on 1/27) at the advice of neuro-oncology 2. Constipation a. Bowel regimen 3. Nausea/vomiting - resolved a. Continue Zofran as needed 4. UTI a. Completed 3 days rocephin b. Urine culture ordered not obtained  DVT prophylaxis: SCD Diet: Regular Family Communication: Updated patient's husband yesterday.  He prefers to be contacted by cell phone [phone] Disposition Plan: Continue IV steroids, medically stable for discharge to CIR once bed available hopefully Monday  Consultants  Neurology Neurosurgery Neuro-oncology  Procedures  EEG  Antibiotics   Anti-infectives (From admission, onward)   Start     Dose/Rate Route Frequency Ordered Stop   11/28/19 2200  cefTRIAXone (ROCEPHIN) 1 g in sodium chloride 0.9 % 100 mL IVPB  Status:  Discontinued     1 g 200 mL/hr over 30 Minutes Intravenous Every 24 hours 11/28/19 0129 11/30/19 1441   11/28/19 0145  cefTRIAXone (ROCEPHIN) 1 g in sodium chloride 0.9 % 100 mL IVPB     1 g 200 mL/hr over 30 Minutes Intravenous  Once 11/28/19 0131 11/28/19 0226           Subjective   Seen and examined at bedside no acute distress resting comfortably.  States that she was able to ambulate somewhat yesterday with nursing but during our encounter had not ambulated yet.  Tolerating diet without any complaints at this time.  Objective   Vitals:   12/01/19 2359 12/02/19 0433 12/02/19 0855 12/02/19 1100  BP:  125/71 123/78 100/60 100/66  Pulse: (!) 58 63 81 77  Resp:  18 18 18   Temp: 98.7 F (37.1 C) 98.5 F (36.9 C) 98.6 F (37 C) 98.5 F (36.9 C)  TempSrc: Oral Oral Oral Oral  SpO2: 97% 95% 99% 98%    Intake/Output Summary (Last 24 hours) at 12/02/2019 1450 Last data filed at  12/02/2019 1223 Gross per 24 hour  Intake 480 ml  Output 1100 ml  Net -620 ml   There were no vitals filed for this visit.  Examination:  Physical Exam Vitals and nursing note reviewed.  Constitutional:      Appearance: She is not ill-appearing or diaphoretic.  HENT:     Head: Normocephalic.     Mouth/Throat:     Mouth: Mucous membranes are moist.  Eyes:     Extraocular Movements: Extraocular movements intact.  Cardiovascular:     Rate and Rhythm: Normal rate and regular rhythm.  Pulmonary:     Effort: Pulmonary effort is normal.     Breath sounds: Normal breath sounds.  Abdominal:     General: Abdomen is flat. There is no distension.  Musculoskeletal:        General: No swelling or tenderness.  Neurological:     Mental Status: She is alert.     Comments: Dysarthria improving but still present Dysmetria almost resolved Heel-to-shin significantly improved     Data Reviewed: I have personally reviewed following labs and imaging studies  CBC: Recent Labs  Lab 11/27/19 1939 11/27/19 1945 11/29/19 0457 11/30/19 0244 12/02/19 0250  WBC 17.3*  --  14.4* 11.9* 10.1  NEUTROABS 15.7*  --   --   --   --   HGB 14.9 14.6 13.6 13.5 15.0  HCT 44.1 43.0 39.1 39.5 43.6  MCV 91.9  --  89.5 90.4 88.8  PLT 276  --  263 255 956   Basic Metabolic Panel: Recent Labs  Lab 11/27/19 1939 11/27/19 1939 11/27/19 1945 11/28/19 0435 11/29/19 0457 11/30/19 0244 12/02/19 0250  NA 140   < > 139 139 139 140 139  K 3.5   < > 3.5 3.5 3.8 4.1 4.5  CL 107   < > 104 102 105 107 103  CO2 18*  --   --  24 22 23 23   GLUCOSE 158*   < > 150* 155* 126* 121* 110*  BUN 12   < > 13 12 19 17  25*  CREATININE 0.94   < > 0.70 0.81 0.81 0.82 0.82  CALCIUM 9.5  --   --  9.5 9.3 9.2 9.4  MG  --   --   --   --  2.1 2.3  --    < > = values in this interval not displayed.   GFR: CrCl cannot be calculated (Unknown ideal weight.). Liver Function Tests: Recent Labs  Lab 11/27/19 1939 11/28/19 0435   AST 19 18  ALT 14 13  ALKPHOS 76 79  BILITOT 0.7 0.7  PROT 6.7 6.6  ALBUMIN 4.1 3.9   No results for input(s): LIPASE, AMYLASE in the last 168 hours. No results for input(s): AMMONIA in the last 168 hours. Coagulation Profile: Recent Labs  Lab 11/27/19 1939  INR 1.0   Cardiac Enzymes: No results for input(s): CKTOTAL, CKMB, CKMBINDEX, TROPONINI in the last 168 hours. BNP (last 3 results) No results for input(s): PROBNP in the last 8760 hours. HbA1C: No results for input(s): HGBA1C in the last 72 hours. CBG: Recent Labs  Lab 11/27/19 1941  GLUCAP 152*   Lipid Profile: No results for input(s): CHOL, HDL, LDLCALC, TRIG, CHOLHDL, LDLDIRECT in the last 72 hours. Thyroid Function Tests: No results for input(s): TSH, T4TOTAL, FREET4, T3FREE, THYROIDAB in the last 72 hours. Anemia Panel: No results for input(s): VITAMINB12, FOLATE, FERRITIN, TIBC, IRON, RETICCTPCT in the last 72 hours. Sepsis Labs: No results for input(s): PROCALCITON, LATICACIDVEN in the last 168 hours.  Recent Results (from the past 240 hour(s))  Respiratory Panel by RT PCR (Flu A&B, Covid) - Nasopharyngeal Swab     Status: None   Collection Time: 11/27/19  8:28 PM   Specimen: Nasopharyngeal Swab  Result Value Ref Range Status   SARS Coronavirus 2 by RT PCR NEGATIVE NEGATIVE Final    Comment: (NOTE) SARS-CoV-2 target nucleic acids are NOT DETECTED. The SARS-CoV-2 RNA is generally detectable in upper respiratoy specimens during the acute phase of infection. The lowest concentration of SARS-CoV-2 viral copies this assay can detect is 131 copies/mL. A negative result does not preclude SARS-Cov-2 infection and should not be used as the sole basis for treatment or other patient management decisions. A negative result may occur with  improper specimen collection/handling, submission of specimen other than nasopharyngeal swab, presence of viral mutation(s) within the areas targeted by this assay, and  inadequate number of viral copies (<131 copies/mL). A negative result must be combined with clinical observations, patient history, and epidemiological information. The expected result is Negative. Fact Sheet for Patients:  PinkCheek.be Fact Sheet for Healthcare Providers:  GravelBags.it This test is not yet ap proved or cleared by the [place] FDA and  has been authorized for detection and/or diagnosis of SARS-CoV-2 by FDA under an Emergency Use Authorization (EUA). This EUA will remain  in effect (meaning this test can be used) for the duration of the COVID-19 declaration under Section 564(b)(1) of the Act, 21 U.S.C. section 360bbb-3(b)(1), unless the authorization is terminated or revoked sooner.    Influenza A by PCR NEGATIVE NEGATIVE Final   Influenza B by PCR NEGATIVE NEGATIVE Final    Comment: (NOTE) The Xpert Xpress SARS-CoV-2/FLU/RSV assay is intended as an aid in  the diagnosis of influenza from Nasopharyngeal swab specimens and  should not be used as a sole basis for treatment. Nasal washings and  aspirates are unacceptable for Xpert Xpress SARS-CoV-2/FLU/RSV  testing. Fact Sheet for Patients: PinkCheek.be Fact Sheet for Healthcare Providers: GravelBags.it This test is not yet approved or cleared by the [place] FDA and  has been authorized for detection and/or diagnosis of SARS-CoV-2 by  FDA under an Emergency Use Authorization (EUA). This EUA will remain  in effect (meaning this test can be used) for the duration of the  Covid-19 declaration under Section 564(b)(1) of the Act, 21  U.S.C. section 360bbb-3(b)(1), unless the authorization is  terminated or revoked. Performed at [facility], [place] [address] [place], [place] [address]          Radiology Studies: No results found.      Scheduled Meds: . dexamethasone (DECADRON)  injection  4 mg Intravenous BID  . docusate sodium  100 mg Oral Daily  . levETIRAcetam  500 mg Oral BID   Continuous Infusions: . sodium chloride 250 mL (11/29/19 2036)     LOS: 5 days    Time spent: 15 minutes with over 50% of the time coordinating the patient's care    Harold Hedge, DO [facility] Pager [phone]  If 7PM-7AM, please contact night-coverage  [email or web address] Password [email or web address] 12/02/2019, 2:50 PM

## 2019-12-02 NOTE — TOC Initial Note (Signed)
Transition of Care [facility]) - Initial/Assessment Note    Patient Details  Name: Hailey Hill MRN: [id number] Date of Birth: 1951/10/02  Transition of Care [facility]) CM/SW Contact:    Hailey Hill, [place] Phone Number: 12/02/2019, 11:29 AM  Clinical Narrative:                 CSW reached pt via room telephone. Introduced self, role, reason for call. Pt from home with her husband Hailey Hill. CSW briefly explained that CIR bed availability has been tight and requested to call pt husband to discuss other options should pt not be able to obtain bed at CIR. Pt gives permission to speak with pt husband stating "I don't want to go to those other places though they're terrible." CSW acknowledged that statement and let pt know I would discuss with pt husband.   CSW called and reached pt husband Hailey Hill at [phone]). Pt husband currently driving in the rain back from [place], [place] to f/u with him after 1pm. CSW has also reached out to CIR, await response.   Expected Discharge Plan: IP Rehab Facility Barriers to Discharge: Continued Medical Work up, Ship broker, Other (comment)(bed availability at [facility])   Patient Goals and CMS Choice Patient states their goals for this hospitalization and ongoing recovery are:: to go to inpatient rehab CMS Medicare.gov Compare Post Acute Care list provided to:: Patient Represenative (must comment)(pt spouse) Choice offered to / list presented to : Spouse  Expected Discharge Plan and Services Expected Discharge Plan: [place] In-house Referral: Clinical Social Work Discharge Planning Services: CM Consult Post Acute Care Choice: IP Rehab Living arrangements for the past 2 months: Single Family Home  Prior Living Arrangements/Services Living arrangements for the past 2 months: Single Family Home Lives with:: Spouse Patient language and need for interpreter reviewed:: Yes(no needs) Do you feel safe going back to the place where you live?: Yes      Need  for Family Participation in Patient Care: Yes (Comment)(assistance with daily cares) Care giver support system in place?: Yes (comment)(pt husband Hailey Hill) Current home services: DME     Permission Sought/Granted Permission sought to share information with : Family Supports Permission granted to share information with : Yes, Verbal Permission Granted  Share Information with NAME: Hailey Hill     Permission granted to share info w Relationship: husband  Permission granted to share info w Contact Information: [phone]  Emotional Assessment Appearance:: Other (Comment Required(telephonic assessment) Attitude/Demeanor/Rapport: Gracious, Engaged(telephonic assessment) Affect (typically observed): Appropriate(telephonic assessment) Orientation: : Oriented to Self, Oriented to Place, Oriented to  Time, Oriented to Situation Alcohol / Substance Use: Not Applicable Psych Involvement: No (comment)  Admission diagnosis:  Subarachnoid hemorrhage (HCC) [I60.9] ICH (intracerebral hemorrhage) ([place]) [I61.9] Intracranial hemorrhage, nontraumatic ([place]) [I62.9] New onset seizure ([place]) [R56.9] Closed fracture of temporal bone, initial encounter ([place]) [I34.74QV] Patient Active Problem List   Diagnosis Date Noted  . ICH (intracerebral hemorrhage) ([place]) 11/27/2019  . Acute lower UTI 11/27/2019  . Fall 07/10/2019  . Closed fracture of head of left humerus   . Metastatic lung cancer (metastasis from lung to other site) ([place]) 07/07/2019  . Fracture, humerus, anatomical neck, left, closed, initial encounter 07/07/2019  . Metastatic cancer to brain ([place]) 07/07/2019  . Chest pain with moderate risk for cardiac etiology 12/14/2015  . Small cell lung cancer ([place]) 09/16/2013   PCP:  Hailey Beals, NP Pharmacy:   [id number] - [place], [place] [address]  [place] [address] Phone: [phone] Fax:  [phone]    Readmission Risk Interventions Readmission Risk Prevention Plan 12/02/2019  Transportation Screening Complete  PCP or Specialist Appt within 5-7 Days Not Complete  Not Complete comments disposition pending  Home Care Screening Complete  Medication Review (RN CM) Referral to Pharmacy  Some recent data might be hidden

## 2019-12-02 NOTE — PMR Pre-admission (Addendum)
PMR Admission Coordinator Pre-Admission Assessment  Patient: Hailey Hill is an 69 y.o., female MRN: [id number] DOB: Jun 08, 1951 Height:  _0  Weight:  71.3 kg              Insurance Information HMO: yes    PPO:      PCP:      IPA:      80/20:      OTHER:  PRIMARY: [employer]      Policy#: [id number]      Subscriber: pt CM Name: Hailey Hill      Phone#: [phone]     Fax#: [phone] Pre-Cert#: [id number] Hailey Hill for CIR admission provided by Hailey Hill with [facility] Medicare on 1/26 for 1/27 admission, updates due to [facility] on 2/3 to fax listed above      Employer:  Benefits:  Phone #: [phone]     Name:  Eff. Date: 11/08/19     Deduct: $0      Out of Pocket Max: $3900 (met $0)      Life Max: n/a CIR: $295/day for days  1-6      SNF: 20 full days Outpatient:      Co-Pay: $10-$40/visit depending on service Home Health: 100%      Co-Pay:  DME: 80%     Co-Pay: 20% Providers:  SECONDARY:       Policy#:       Subscriber:  CM Name:       Phone#:      Fax#:  Pre-Cert#:       Employer:  Benefits:  Phone #:      Name:  Eff. Date:      Deduct:       Out of Pocket Max:       Life Max:  CIR:       SNF:  Outpatient:      Co-Pay:  Home Health:       Co-Pay:  DME:      Co-Pay:   Medicaid Application Date:       Case Manager:  Disability Application Date:       Case Worker:   The "Data Collection Information Summary" for patients in Inpatient Rehabilitation Facilities with attached "Privacy Act [place] Records" was provided and verbally reviewed with: Patient and Family  Emergency Contact Information Contact Information    Name Relation Home Work Mobile   Hailey Hill Spouse [phone]  [phone]   Hailey Hill [phone]  [phone]     Current Medical History  Patient Admitting Diagnosis: 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 CT head done showing small hemorrhage in right cerebellum without change in mass effect and small hemorrhagic contusion in right temporal lobe. Neurology consulted and felt that patient likely with seizure activity leading to LOC, fall and trauma to her head. She was placed on Keppra and decadron added for cerebellar edema. Dr. Saintclair Halsted consulted for input and felt patient with brain mets with additions lesions likely due to failure of tx and supportive care recommended. Dr. Lanell Persons and Dr. Mickeal Skinner  to follow for input on next step.  Therapy evaluations completed revealing mild to moderate cognitive deficits with delayed processing, nausea with  activity, decreased motor planning, diplopia and balance deficits affecting ADLs and mobility. CIR recommended due to functional decline.  Complete NIHSS TOTAL: 0    Past Medical History  Past Medical History:  Diagnosis Date  . Complication of anesthesia    had complications with "gas buildup" after tubal lig  . Dyslipidemia   . History of radiation therapy 07/16/19, 07/19/19, 07/22/19   SRS radiation to 2 cerebellum targets.   . Lung cancer ([place]) dx'd 07/2010   sm cell   . Peripheral neuropathy   . SVC syndrome   . Thrombus 41/96/2229   Left basilic vein,  Left subclavian vein    Family History  family history includes Heart attack in her father, paternal grandfather, and paternal uncle; Nephrolithiasis in her son.  Prior Rehab/Hospitalizations:  Has the patient had prior rehab or hospitalizations prior to admission? Yes  Has the patient had major surgery during 100 days prior to admission? No  Current Medications   Current Facility-Administered Medications:  .  0.9 %  sodium chloride infusion, , Intravenous, PRN, Harold Hedge, MD, Last Rate: 10  mL/hr at 11/29/19 2036, 250 mL at 11/29/19 2036 .  acetaminophen (TYLENOL) tablet 650 mg, 650 mg, Oral, Q4H PRN **OR** acetaminophen (TYLENOL) 160 MG/5ML solution 650 mg, 650 mg, Per Tube, Q4H PRN **OR** acetaminophen (TYLENOL) suppository 650 mg, 650 mg, Rectal, Q4H PRN, Jani Gravel, MD .  docusate sodium (COLACE) capsule 100 mg, 100 mg, Oral, Daily, Marva Panda E, MD, 100 mg at 12/04/19 1045 .  hydrALAZINE (APRESOLINE) injection 5 mg, 5 mg, Intravenous, Q6H PRN, Jani Gravel, MD, 5 mg at 11/27/19 2359 .  levETIRAcetam (KEPPRA) tablet 500 mg, 500 mg, Oral, BID, Harold Hedge, MD, 500 mg at 12/04/19 1045 .  ondansetron (ZOFRAN) injection 4 mg, 4 mg, Intravenous, Q6H PRN, Jani Gravel, MD, 4 mg at 11/30/19 1625  Patients Current Diet:  Diet Order            Diet - low sodium heart healthy        Diet regular Room service appropriate? Yes; Fluid consistency: Thin  Diet effective now              Precautions / Restrictions Precautions Precautions: Fall Restrictions Weight Bearing Restrictions: No   Has the patient had 2 or more falls or a fall with injury in the past year?Yes  Prior Activity Level Community (5-7x/wk): independent, no AD, not currently driving with known brain mets  Prior Functional Level Prior Function Level of Independence: Independent  Self Care: Did the patient need help bathing, dressing, using the toilet or eating?  Independent  Indoor Mobility: Did the patient need assistance with walking from room to room (with or without device)? Independent  Stairs: Did the patient need assistance with internal or external stairs (with or without device)? Independent  Functional Cognition: Did the patient need help planning regular tasks such as shopping or remembering to take medications? Independent  Home Assistive Devices / Equipment Home Equipment: Grab bars - tub/shower  Prior Device Use: Indicate devices/aids used by the patient prior to current illness,  exacerbation or injury? None of the above  Current Functional Level Cognition  Arousal/Alertness: Awake/alert(drowsy) Overall Cognitive Status: Impaired/Different from baseline Orientation Level: Oriented X4 Following Commands: Follows one step commands with increased time Safety/Judgement: Decreased awareness of safety, Decreased awareness of deficits General Comments: pt impulsive throughout session and distracted in busy environment. pt with poor safety awareness needing cues during mobility Attention: Sustained Sustained Attention:  Appears intact Memory: Impaired Memory Impairment: Decreased short term memory Problem Solving: Appears intact Executive Function: Reasoning Reasoning: Appears intact    Extremity Assessment (includes Sensation/Coordination)  Upper Extremity Assessment: Defer to OT evaluation LUE Deficits / Details: Pt reports decreased ROM due to h/o breaking Lt arm  Lower Extremity Assessment: RLE deficits/detail, LLE deficits/detail RLE Deficits / Details: Strength 5/5 LLE Deficits / Details: Strength 5/5    ADLs  Overall ADL's : Needs assistance/impaired Grooming: Min guard, Sitting Grooming Details (indicate cue type and reason): min guard for sitting balance due to instability and decreased balance reactions Lower Body Bathing: Supervison/ safety, Set up, Sitting/lateral leans Lower Body Dressing: Minimal assistance, Bed level Lower Body Dressing Details (indicate cue type and reason): pt Hill to bridge hips and doff underwear; MIN A to manipulate underwear around purewick Toilet Transfer: Moderate assistance, Ambulation, RW Toilet Transfer Details (indicate cue type and reason): MOD A for safety and balance for simulated toilet transfer via functional mobility with RW. pt needed cues for safety in relation to hand placement and body mechanics Toileting- Clothing Manipulation and Hygiene: +2 for physical assistance, Moderate assistance Functional mobility  during ADLs: Moderate assistance, Rolling walker General ADL Comments: session focus on seated LB bathing; LB dressing and functional mobility. pt limited by poor balance and trunk control and generalized weakness    Mobility  Overal bed mobility: Needs Assistance Bed Mobility: Supine to Sit, Sit to Supine Supine to sit: Min assist, HOB elevated Sit to supine: Min guard, HOB elevated General bed mobility comments: pt Hill to progress to EOB with heavy use of bed rails to scoot self to EOb with HOB elevated. pt returned self to supine with min guard for safety    Transfers  Overall transfer level: Needs assistance Equipment used: Rolling walker (2 wheeled) Transfers: Sit to/from Stand Sit to Stand: Min assist, Mod assist Stand pivot transfers: Mod assist General transfer comment: pt required min a for sit>stand from elevated EOB and MOD A from [facility] needing cues for hand placement and overall technique    Ambulation / Gait / Stairs / Wheelchair Mobility  Ambulation/Gait Ambulation/Gait assistance: Mod assist, Min assist Gait Distance (Feet): 100 Feet Assistive device: Rolling walker (2 wheeled) Gait Pattern/deviations: Step-through pattern, Narrow base of support, Ataxic, Decreased stride length, Scissoring General Gait Details: Pt required assistance to maintain balance.  Pt required increased assistance to turn and for backing.  Noted with RLE scissoring to midline.R lateral lean with intermittent moderate assistance to maintain balance. Gait velocity: decreased    Posture / Balance Dynamic Sitting Balance Sitting balance - Comments: close min guard for static sitting Balance Overall balance assessment: Needs assistance Sitting-balance support: Feet supported Sitting balance-Leahy Scale: Fair Sitting balance - Comments: close min guard for static sitting Standing balance support: During functional activity, Bilateral upper extremity supported Standing balance-Leahy Scale:  Poor Standing balance comment: reliant on external support and BUE support    Special needs/care consideration BiPAP/CPAP no CPM no Continuous Drip IV no Dialysis no        Days n/a Life Vest no Oxygen no Special Bed no Trach Size no Wound Vac (area) no      Location n/a Skin intact                              Location  Bowel mgmt: continent Bladder mgmt: incontinent Diabetic mgmt no Behavioral consideration no Chemo/radiation radiation in Sept 2020  Previous Home Environment (from acute therapy documentation) Living Arrangements: Spouse/significant other Available Help at Discharge: Family Type of Home: House Home Layout: One level Home Access: Stairs to enter Entrance Stairs-Rails: Right Entrance Stairs-Number of Steps: 4 Bathroom Shower/Tub: Tub/shower unit  Discharge Living Setting Plans for Discharge Living Setting: Patient's home Type of Home at Discharge: House Discharge Home Layout: One level Discharge Home Access: Stairs to enter Entrance Stairs-Rails: Right Entrance Stairs-Number of Steps: 4 Discharge Bathroom Shower/Tub: [place] unit Discharge Bathroom Toilet: Standard Discharge [place] Accessibility: Yes How Accessible: Accessible via walker Does the patient have any problems obtaining your medications?: No  Social/Family/Support Systems Anticipated Caregiver: spouse [name]), and intermittently son [name]) Anticipated Caregiver's Contact Information: [name] [phone], [name] [phone] Ability/Limitations of Caregiver: spouse works, but is aware of need for 24/7 supervision recommendations initially at discharge Caregiver Availability: 24/7 Discharge Plan Discussed with Primary Caregiver: Yes Is Caregiver In Agreement with Plan?: Yes Does Caregiver/Family have Issues with Lodging/Transportation while Pt is in Rehab?: No   Goals/Additional Needs Patient/Family Goal for Rehab: PT/OT/SLP supervision to mod I Expected length of stay: 10-14  days Dietary Needs: reg/thin Additional Information: Discussed pt would likely need 24/7 supervision at discharge, family vs hired caregivers, and that given pt's diagnosis it was possible that permanent 24/7 supervision/assist might be imminent.   Pt/Family Agrees to Admission and willing to participate: Yes Program Orientation Provided & Reviewed with Pt/Caregiver Including Roles  & Responsibilities: Yes   Decrease burden of Care through IP rehab admission: n/a   Possible need for SNF placement upon discharge: Not anticipated. Family aware that SNF will not likely be approved following an inpatient rehab stay and that recommendations are for, at least initial, 24/7 supervision.  This patient's medical and functional status has changed since the consult dated: 11/28/2019 in which the Rehabilitation Physician determined and documented that the patient's condition is appropriate for intensive rehabilitative care in an inpatient rehabilitation facility. See "History of Present Illness" (above) for medical update. Functional changes are: max to total +2. Patient's medical and functional status update has been discussed with the Rehabilitation physician and patient remains appropriate for inpatient rehabilitation. Will admit to inpatient rehab today.  Preadmission Screen Completed By:  Michel Santee, PT, DPT 12/04/2019 11:10 AM ______________________________________________________________________   Discussed status with Dr. Posey Pronto on 12/04/19 at 11:10 AM  and received approval for admission today.  Admission Coordinator:  Michel Santee, PT, DPT time 11:10 AM Sudie Grumbling 12/04/19

## 2019-12-02 NOTE — Progress Notes (Signed)
Physical Therapy Treatment Patient Details Name: Hailey Hill MRN: [id number] DOB: [birth year] Today's Date: 12/02/2019    History of Present Illness 69 year old female with known metastatic disease has undergone radiation treatment and stereotactic radiosurgery to multiple brain mets primarily cerebellum.  Presented with altered mental status last night showing small amount of hemorrhage around a known right cerebellar hemisphere lesion.  Vermian lesion stable additional lesions also noted that were not there on MRI scan a month ago.    PT Comments    Pt supine in bed on arrival and eager to mobilize.  She continues to require mod-max assistance to mob ilize.  She is tolerating increased activity well but continues to benefit from aggressive rehab at [facility] before returning home.  Plan for continued progression of mobility to reduce risk for falls and increase functional mobility before returning home.    Follow Up Recommendations  CIR;Supervision/Assistance - 24 hour     Equipment Recommendations  Rolling walker with 5" wheels    Recommendations for Other Services Rehab consult     Precautions / Restrictions Precautions Precautions: Fall Restrictions Weight Bearing Restrictions: No    Mobility  Bed Mobility Overal bed mobility: Needs Assistance Bed Mobility: Supine to Sit;Sit to Supine     Supine to sit: Mod assist     General bed mobility comments: Increased time/effort; Pt required assistance to advance LEs to edge of bed and elevate trunk into a seated position.  Pt required cueing for hand placement.  Transfers Overall transfer level: Needs assistance Equipment used: Rolling walker (2 wheeled) Transfers: Sit to/from Stand Sit to Stand: Mod assist         General transfer comment: Cues for hand placement and assistance to lift into standing.  Ambulation/Gait Ambulation/Gait assistance: Mod assist;Max assist Gait Distance (Feet): 120 Feet Assistive device:  Rolling walker (2 wheeled) Gait Pattern/deviations: Step-through pattern;Narrow base of support;Ataxic;Decreased stride length;Scissoring Gait velocity: decreased   General Gait Details: Pt required assistance to maintain balance.  Pt required increased assistance to turn and for backing.  Noted with RLE scissoring to midline.   Stairs             Wheelchair Mobility    Modified Rankin (Stroke Patients Only) Modified Rankin (Stroke Patients Only) Pre-Morbid Rankin Score: No symptoms Modified Rankin: Moderately severe disability     Balance Overall balance assessment: Needs assistance Sitting-balance support: Feet supported Sitting balance-Leahy Scale: Fair     Standing balance support: Single extremity supported;During functional activity Standing balance-Leahy Scale: Poor Standing balance comment: reliant on external support                            Cognition Arousal/Alertness: Awake/alert Behavior During Therapy: Flat affect;Impulsive Overall Cognitive Status: Impaired/Different from baseline Area of Impairment: Orientation;Safety/judgement;Problem solving;Awareness;Following commands                 Orientation Level: Disoriented to;Time     Following Commands: Follows one step commands inconsistently Safety/Judgement: Decreased awareness of safety;Decreased awareness of deficits Awareness: Intellectual Problem Solving: Difficulty sequencing;Requires verbal cues;Slow processing General Comments: Pt impulsive, but able to be redirected.Pt very eager to participate and ambulate but does have decreased awareness of safety/deficits; needs cues for safety during mobility      Exercises General Exercises - Lower Extremity Long Arc Quad: AROM;Both;10 reps;Seated Hip ABduction/ADduction: AROM;Both;10 reps;Supine Hip Flexion/Marching: AROM;Both;10 reps;Seated    General Comments        Pertinent Vitals/Pain Pain  Assessment: Faces Faces Pain  Scale: No hurt    Home Living                      Prior Function            PT Goals (current goals can now be found in the care plan section) Acute Rehab PT Goals Patient Stated Goal: none stated, agreeable to therapy Potential to Achieve Goals: Good Progress towards PT goals: Progressing toward goals    Frequency    Min 4X/week      PT Plan Current plan remains appropriate    Co-evaluation              AM-PAC PT "6 Clicks" Mobility   Outcome Measure  Help needed turning from your back to your side while in a flat bed without using bedrails?: A Little Help needed moving from lying on your back to sitting on the side of a flat bed without using bedrails?: A Lot Help needed moving to and from a bed to a chair (including a wheelchair)?: A Lot Help needed standing up from a chair using your arms (e.g., wheelchair or bedside chair)?: A Lot Help needed to walk in hospital room?: A Lot Help needed climbing 3-5 steps with a railing? : A Lot 6 Click Score: 13    End of Session Equipment Utilized During Treatment: Gait belt Activity Tolerance: Patient tolerated treatment well Patient left: in chair;with call bell/phone within reach;with chair alarm set Nurse Communication: Mobility status PT Visit Diagnosis: Unsteadiness on feet (R26.81);Difficulty in walking, not elsewhere classified (R26.2)     Time: 1540-0867 PT Time Calculation (min) (ACUTE ONLY): 11 min  Charges:  $Gait Training: 8-22 mins                     Erasmo Leventhal , PTA Acute Rehabilitation Services Pager [phone] Office [phone]     [name] 12/02/2019, 2:48 PM

## 2019-12-03 NOTE — Progress Notes (Signed)
Physical Therapy Treatment Patient Details Name: Hailey Hill MRN: [id number] DOB: Mar 09, 1951 Today's Date: 12/03/2019    History of Present Illness 69 year old female with known metastatic disease has undergone radiation treatment and stereotactic radiosurgery to multiple brain mets primarily cerebellum.  Presented with altered mental status last night showing small amount of hemorrhage around a known right cerebellar hemisphere lesion.  Vermian lesion stable additional lesions also noted that were not there on MRI scan a month ago.    PT Comments    Pt supine in bed and eager to mobilize.  She remains limited due to poor strength and activation of trunk.  Pt required min to moderate assistance to mobilize and maintain balace.  Post gt training she performed standing exercises with heavy UE support.  Continue to recommend CIR at d/c to improve strength and function before returning home.     Follow Up Recommendations  CIR;Supervision/Assistance - 24 hour     Equipment Recommendations  Rolling walker with 5" wheels    Recommendations for Other Services Rehab consult     Precautions / Restrictions Precautions Precautions: Fall Restrictions Weight Bearing Restrictions: No    Mobility  Bed Mobility Overal bed mobility: Needs Assistance Bed Mobility: Supine to Sit;Sit to Supine     Supine to sit: Mod assist     General bed mobility comments: Pt able to move to sitting with moderate assistance due to poor trunk activation.  She presents with posterior LOB sitting edge of bed.  Transfers Overall transfer level: Needs assistance Equipment used: Rolling walker (2 wheeled) Transfers: Sit to/from Stand Sit to Stand: Mod assist Stand pivot transfers: Mod assist       General transfer comment: Cues for hand placement  and assistance to boost into standing.  Posterior bias and R lateral lean noted.  Ambulation/Gait Ambulation/Gait assistance: Mod assist;Min assist Gait  Distance (Feet): 100 Feet Assistive device: Rolling walker (2 wheeled) Gait Pattern/deviations: Step-through pattern;Narrow base of support;Ataxic;Decreased stride length;Scissoring Gait velocity: decreased   General Gait Details: Pt required assistance to maintain balance.  Pt required increased assistance to turn and for backing.  Noted with RLE scissoring to midline.R lateral lean with intermittent moderate assistance to maintain balance.   Stairs             Wheelchair Mobility    Modified Rankin (Stroke Patients Only) Modified Rankin (Stroke Patients Only) Pre-Morbid Rankin Score: No symptoms Modified Rankin: Moderately severe disability     Balance Overall balance assessment: Needs assistance   Sitting balance-Leahy Scale: Fair       Standing balance-Leahy Scale: Poor                              Cognition Arousal/Alertness: Awake/alert Behavior During Therapy: Flat affect;Impulsive Overall Cognitive Status: Impaired/Different from baseline Area of Impairment: Orientation;Safety/judgement;Problem solving;Awareness;Following commands                 Orientation Level: Disoriented to;Time     Following Commands: Follows one step commands inconsistently Safety/Judgement: Decreased awareness of safety;Decreased awareness of deficits Awareness: Intellectual Problem Solving: Difficulty sequencing;Requires verbal cues;Slow processing        Exercises General Exercises - Lower Extremity Hip Flexion/Marching: AROM;Both;10 reps;Standing Heel Raises: AROM;Both;10 reps;Standing Mini-Sqauts: AROM;Both;10 reps;Standing    General Comments        Pertinent Vitals/Pain Pain Assessment: Faces Faces Pain Scale: No hurt    Home Living  Prior Function            PT Goals (current goals can now be found in the care plan section) Acute Rehab PT Goals Patient Stated Goal: none stated, agreeable to  therapy Potential to Achieve Goals: Good Progress towards PT goals: Progressing toward goals    Frequency    Min 4X/week      PT Plan Current plan remains appropriate    Co-evaluation              AM-PAC PT "6 Clicks" Mobility   Outcome Measure  Help needed turning from your back to your side while in a flat bed without using bedrails?: A Little Help needed moving from lying on your back to sitting on the side of a flat bed without using bedrails?: A Lot Help needed moving to and from a bed to a chair (including a wheelchair)?: A Lot Help needed standing up from a chair using your arms (e.g., wheelchair or bedside chair)?: A Lot Help needed to walk in hospital room?: A Lot Help needed climbing 3-5 steps with a railing? : A Lot 6 Click Score: 13    End of Session Equipment Utilized During Treatment: Gait belt Activity Tolerance: Patient tolerated treatment well Patient left: in chair;with call bell/phone within reach;with chair alarm set Nurse Communication: Mobility status PT Visit Diagnosis: Unsteadiness on feet (R26.81);Difficulty in walking, not elsewhere classified (R26.2)     Time: 1205-1224 PT Time Calculation (min) (ACUTE ONLY): 19 min  Charges:  $Gait Training: 8-22 mins                     Erasmo Leventhal , PTA Acute Rehabilitation Services Pager [phone] Office [phone]     Rosalia Mcavoy Eli Hose 12/03/2019, 12:50 PM

## 2019-12-03 NOTE — Progress Notes (Signed)
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 Earlie Server, oncology, who presented with complaints of nausea/vomiting found down by her husband in yard with last known normal 4:30 PM 1/20.  Since being admitted neurology, neurosurgery and neuro-oncology have been consulted and she has had an EEG done.  Has been getting steroids.  Passed swallow study.  Has been approved for CIR when bed available.  A & P   Principal Problem:   ICH (intracerebral hemorrhage) (HCC) Active Problems:   Metastatic lung cancer (metastasis from lung to other site) [facility])   Metastatic cancer to brain ([place])   Acute lower UTI   1. Found down with confusion in setting of Small cell lung cancer with metastases to brain, improving a. CT brain with small SAH b. MRI brain: Severe cerebellar edema, New contrast-enhancing lesions in right cerebellar hemisphere consistent with metastatic disease c. EEG with no epileptiform discharges seen, study consistent with underlying hemorrhage and mild encephalopathy d. No role for surgery per neurosurgery, continue supportive care e. Keep SBP<160 per neurology f. Per neuro-oncology: Clinical and radiographic signs consistent with metastases and subsequent hemorrhagic conversion with progression of recently treated large midline vermis lesion contributing.  Etiology of progression is likely radial inflammatory change however organic tumor progression not ruled out.  Novel lesions best suited for radiosurgery once subclinical condition stabilizes.  Case to be discussed at brain/spine tumor board meeting. To discuss with Dr.  Isidore Moos and possible utility of repeat MRI brain.   g. OOB h. Transitioned to dexamethasone to 4 mg twice daily on 1/22 since mentation improved then 4 mg daily after 5 days of twice daily therapy. (Change to daily on 1/27) at the advice of neuro-oncology i. Discontinue telemetry 2. Constipation, resolved on Bowel regimen 3. Nausea/vomiting - resolved 4. UTI a. Completed 3 days rocephin b. Urine culture ordered not obtained  DVT prophylaxis: SCD Diet: Regular Family Communication: Discussed with husband and son over the phone separately. Husband prefers to be contacted by cell phone [phone]. Rexene Alberts. [phone] Disposition Plan: Continue IV steroids, medically stable for discharge to CIR once bed available hopefully Monday  Consultants  Neurology Neurosurgery Neuro-oncology  Procedures  EEG  Antibiotics   Anti-infectives (From admission, onward)   Start     Dose/Rate Route Frequency Ordered Stop   11/28/19 2200  cefTRIAXone (ROCEPHIN) 1 g in sodium chloride 0.9 % 100 mL IVPB  Status:  Discontinued     1 g 200 mL/hr over 30 Minutes Intravenous Every 24 hours 11/28/19 0129 11/30/19 1441   11/28/19 0145  cefTRIAXone (ROCEPHIN) 1 g in sodium chloride 0.9 % 100 mL IVPB     1 g 200 mL/hr over 30 Minutes Intravenous  Once 11/28/19 0131 11/28/19 0226           Subjective   Evaluated at bedside today. No complaints. States her constipation has resolved.   Objective   Vitals:   12/02/19 1944 12/02/19 2359 12/03/19 0427 12/03/19 0815  BP: 102/65 120/66 111/67 122/69  Pulse: 70 61 63 71  Resp: 18 18 16 18   Temp: 97.9 F (36.6 C) 97.6 F (36.4 C) 97.6 F (  36.4 C) 98.2 F (36.8 C)  TempSrc: Oral Oral Oral Oral  SpO2: 97% 97% 96% 95%    Intake/Output Summary (Last 24 hours) at 12/03/2019 1153 Last data filed at 12/03/2019 0413 Gross per 24 hour  Intake 240 ml  Output 300 ml  Net -60 ml   There were no vitals filed for this  visit.  Examination:  Physical Exam Vitals and nursing note reviewed.  Constitutional:      Appearance: Normal appearance.  HENT:     Head: Normocephalic.  Cardiovascular:     Rate and Rhythm: Normal rate and regular rhythm.  Pulmonary:     Effort: Pulmonary effort is normal.     Breath sounds: Normal breath sounds.  Abdominal:     General: Abdomen is flat.     Palpations: Abdomen is soft.  Musculoskeletal:        General: No swelling or tenderness.  Neurological:     Mental Status: She is alert.     Comments: dysarthria  Psychiatric:        Mood and Affect: Mood normal.        Behavior: Behavior normal.     Data Reviewed: I have personally reviewed following labs and imaging studies  CBC: Recent Labs  Lab 11/27/19 1939 11/27/19 1945 11/29/19 0457 11/30/19 0244 12/02/19 0250  WBC 17.3*  --  14.4* 11.9* 10.1  NEUTROABS 15.7*  --   --   --   --   HGB 14.9 14.6 13.6 13.5 15.0  HCT 44.1 43.0 39.1 39.5 43.6  MCV 91.9  --  89.5 90.4 88.8  PLT 276  --  263 255 048   Basic Metabolic Panel: Recent Labs  Lab 11/27/19 1939 11/27/19 1939 11/27/19 1945 11/28/19 0435 11/29/19 0457 11/30/19 0244 12/02/19 0250  NA 140   < > 139 139 139 140 139  K 3.5   < > 3.5 3.5 3.8 4.1 4.5  CL 107   < > 104 102 105 107 103  CO2 18*  --   --  24 22 23 23   GLUCOSE 158*   < > 150* 155* 126* 121* 110*  BUN 12   < > 13 12 19 17  25*  CREATININE 0.94   < > 0.70 0.81 0.81 0.82 0.82  CALCIUM 9.5  --   --  9.5 9.3 9.2 9.4  MG  --   --   --   --  2.1 2.3  --    < > = values in this interval not displayed.   GFR: CrCl cannot be calculated (Unknown ideal weight.). Liver Function Tests: Recent Labs  Lab 11/27/19 1939 11/28/19 0435  AST 19 18  ALT 14 13  ALKPHOS 76 79  BILITOT 0.7 0.7  PROT 6.7 6.6  ALBUMIN 4.1 3.9   No results for input(s): LIPASE, AMYLASE in the last 168 hours. No results for input(s): AMMONIA in the last 168 hours. Coagulation Profile: Recent Labs  Lab  11/27/19 1939  INR 1.0   Cardiac Enzymes: No results for input(s): CKTOTAL, CKMB, CKMBINDEX, TROPONINI in the last 168 hours. BNP (last 3 results) No results for input(s): PROBNP in the last 8760 hours. HbA1C: No results for input(s): HGBA1C in the last 72 hours. CBG: Recent Labs  Lab 11/27/19 1941  GLUCAP 152*   Lipid Profile: No results for input(s): CHOL, HDL, LDLCALC, TRIG, CHOLHDL, LDLDIRECT in the last 72 hours. Thyroid Function Tests: No results for input(s): TSH, T4TOTAL, FREET4, T3FREE, THYROIDAB in the  last 72 hours. Anemia Panel: No results for input(s): VITAMINB12, FOLATE, FERRITIN, TIBC, IRON, RETICCTPCT in the last 72 hours. Sepsis Labs: No results for input(s): PROCALCITON, LATICACIDVEN in the last 168 hours.  Recent Results (from the past 240 hour(s))  Respiratory Panel by RT PCR (Flu A&B, Covid) - Nasopharyngeal Swab     Status: None   Collection Time: 11/27/19  8:28 PM   Specimen: Nasopharyngeal Swab  Result Value Ref Range Status   SARS Coronavirus 2 by RT PCR NEGATIVE NEGATIVE Final    Comment: (NOTE) SARS-CoV-2 target nucleic acids are NOT DETECTED. The SARS-CoV-2 RNA is generally detectable in upper respiratoy specimens during the acute phase of infection. The lowest concentration of SARS-CoV-2 viral copies this assay can detect is 131 copies/mL. A negative result does not preclude SARS-Cov-2 infection and should not be used as the sole basis for treatment or other patient management decisions. A negative result may occur with  improper specimen collection/handling, submission of specimen other than nasopharyngeal swab, presence of viral mutation(s) within the areas targeted by this assay, and inadequate number of viral copies (<131 copies/mL). A negative result must be combined with clinical observations, patient history, and epidemiological information. The expected result is Negative. Fact Sheet for Patients:   PinkCheek.be Fact Sheet for Healthcare Providers:  GravelBags.it This test is not yet ap proved or cleared by the [place] FDA and  has been authorized for detection and/or diagnosis of SARS-CoV-2 by FDA under an Emergency Use Authorization (EUA). This EUA will remain  in effect (meaning this test can be used) for the duration of the COVID-19 declaration under Section 564(b)(1) of the Act, 21 U.S.C. section 360bbb-3(b)(1), unless the authorization is terminated or revoked sooner.    Influenza A by PCR NEGATIVE NEGATIVE Final   Influenza B by PCR NEGATIVE NEGATIVE Final    Comment: (NOTE) The Xpert Xpress SARS-CoV-2/FLU/RSV assay is intended as an aid in  the diagnosis of influenza from Nasopharyngeal swab specimens and  should not be used as a sole basis for treatment. Nasal washings and  aspirates are unacceptable for Xpert Xpress SARS-CoV-2/FLU/RSV  testing. Fact Sheet for Patients: PinkCheek.be Fact Sheet for Healthcare Providers: GravelBags.it This test is not yet approved or cleared by the [place] FDA and  has been authorized for detection and/or diagnosis of SARS-CoV-2 by  FDA under an Emergency Use Authorization (EUA). This EUA will remain  in effect (meaning this test can be used) for the duration of the  Covid-19 declaration under Section 564(b)(1) of the Act, 21  U.S.C. section 360bbb-3(b)(1), unless the authorization is  terminated or revoked. Performed at [facility], [place] [address], [place], [place] [address]          Radiology Studies: No results found.      Scheduled Meds: . dexamethasone (DECADRON) injection  4 mg Intravenous BID  . docusate sodium  100 mg Oral Daily  . levETIRAcetam  500 mg Oral BID   Continuous Infusions: . sodium chloride 250 mL (11/29/19 2036)     LOS: 6 days    Time spent: 16 minutes with  over 50% of the time coordinating the patient's care    Harold Hedge, DO [facility] Pager [phone]  If 7PM-7AM, please contact night-coverage [email or web address] Password [facility] 12/03/2019, 11:53 AM

## 2019-12-03 NOTE — Progress Notes (Signed)
OT Cancellation Note  Patient Details Name: Hailey Hill MRN: [id number] DOB: [birth year]   Cancelled Treatment:    Reason Eval/Treat Not Completed: Fatigue/lethargy limiting ability to participate;Other (comment) Pt asleep upon OT arrival requesting OT return later. Will check back as time allows.  Lanier Clam., COTA/L Acute Rehabilitation Services [phone]   Ihor Gully 12/03/2019, 2:38 PM

## 2019-12-03 NOTE — Progress Notes (Addendum)
Occupational Therapy Treatment Patient Details Name: Hailey Hill MRN: [id number] DOB: [birth year] Today's Date: 12/03/2019    History of present illness 69 year old female with known metastatic disease has undergone radiation treatment and stereotactic radiosurgery to multiple brain mets primarily cerebellum.  Presented with altered mental status last night showing small amount of hemorrhage around a known right cerebellar hemisphere lesion.  Vermian lesion stable additional lesions also noted that were not there on MRI scan a month ago.   OT comments  Pt making steady progress towards OT goals this session. Session focus on functional mobility with RW and seated LB bathing and dressing. Pt reports purewick leaking upon OT arrival. Pt able to doff underwear by bridging hips in supine needing MIN A to manipulate underwear around purewick. Overall, pt requires MOD A for functional mobility from EOB>sink with RW needing assist for balance. Pt completed seated LB bathing at sink with supervision/ set-up via lateral leans. Pt continues to be slightly impulsive with movement needing cues for safety. Continue to recommend CIR for DC, will follow acutely for OT needs.    Follow Up Recommendations  CIR    Equipment Recommendations  Other (comment)(defer to next venue of care)    Recommendations for Other Services      Precautions / Restrictions Precautions Precautions: Fall Restrictions Weight Bearing Restrictions: No       Mobility Bed Mobility Overal bed mobility: Needs Assistance Bed Mobility: Supine to Sit;Sit to Supine     Supine to sit: Min assist;HOB elevated Sit to supine: Min guard;HOB elevated   General bed mobility comments: pt able to progress to EOB with heavy use of bed rails to scoot self to EOb with HOB elevated. pt returned self to supine with min guard for safety  Transfers Overall transfer level: Needs assistance Equipment used: Rolling walker (2  wheeled) Transfers: Sit to/from Stand Sit to Stand: Min assist;Mod assist Stand pivot transfers: Mod assist       General transfer comment: pt required min a for sit>stand from elevated EOB and MOD A from [facility] needing cues for hand placement and overall technique    Balance Overall balance assessment: Needs assistance Sitting-balance support: Feet supported Sitting balance-Leahy Scale: Fair Sitting balance - Comments: close min guard for static sitting   Standing balance support: During functional activity;Bilateral upper extremity supported Standing balance-Leahy Scale: Poor Standing balance comment: reliant on external support and BUE support                           ADL either performed or assessed with clinical judgement   ADL Overall ADL's : Needs assistance/impaired             Lower Body Bathing: Supervison/ safety;Set up;Sitting/lateral leans       Lower Body Dressing: Minimal assistance;Bed level Lower Body Dressing Details (indicate cue type and reason): pt able to bridge hips and doff underwear; MIN A to manipulate underwear around purewick Toilet Transfer: Moderate assistance;Ambulation;RW Toilet Transfer Details (indicate cue type and reason): MOD A for safety and balance for simulated toilet transfer via functional mobility with RW. pt needed cues for safety in relation to hand placement and body mechanics         Functional mobility during ADLs: Moderate assistance;Rolling walker General ADL Comments: session focus on seated LB bathing; LB dressing and functional mobility. pt limited by poor balance and trunk control and generalized weakness     Vision  Perception     Praxis      Cognition Arousal/Alertness: Awake/alert Behavior During Therapy: WFL for tasks assessed/performed;Impulsive Overall Cognitive Status: Impaired/Different from baseline Area of Impairment: Orientation;Safety/judgement;Problem solving;Awareness;Following  commands                 Orientation Level: Disoriented to;Time     Following Commands: Follows one step commands with increased time Safety/Judgement: Decreased awareness of safety;Decreased awareness of deficits Awareness: Intellectual Problem Solving: Difficulty sequencing;Requires verbal cues;Slow processing General Comments: pt impulsive throughout session and distracted in busy environment. pt with poor safety awareness needing cues during mobility. Pt disoriented to day of week, shocked that she had been here 5 days. Pt able to state month.         Exercises General Exercises - Lower Extremity Hip Flexion/Marching: AROM;Both;10 reps;Standing Heel Raises: AROM;Both;10 reps;Standing Mini-Sqauts: AROM;Both;10 reps;Standing   Shoulder Instructions       General Comments      Pertinent Vitals/ Pain       Pain Assessment: No/denies pain Faces Pain Scale: No hurt  Home Living                                          Prior Functioning/Environment              Frequency  Min 2X/week        Progress Toward Goals  OT Goals(current goals can now be found in the care plan section)  Progress towards OT goals: Progressing toward goals  Acute Rehab OT Goals Patient Stated Goal: none stated, agreeable to therapy OT Goal Formulation: With patient Time For Goal Achievement: 12/12/19 Potential to Achieve Goals: Good  Plan Discharge plan remains appropriate    Co-evaluation                 AM-PAC OT "6 Clicks" Daily Activity     Outcome Measure   Help from another person eating meals?: A Little Help from another person taking care of personal grooming?: A Little Help from another person toileting, which includes using toliet, bedpan, or urinal?: A Lot Help from another person bathing (including washing, rinsing, drying)?: A Lot Help from another person to put on and taking off regular upper body clothing?: A Little Help from another  person to put on and taking off regular lower body clothing?: A Lot 6 Click Score: 15    End of Session Equipment Utilized During Treatment: Gait belt;Rolling walker  OT Visit Diagnosis: Unsteadiness on feet (R26.81);History of falling (Z91.81);Other symptoms and signs involving the nervous system (R29.898);Other symptoms and signs involving cognitive function   Activity Tolerance Patient tolerated treatment well   Patient Left in bed;with call bell/phone within reach;with bed alarm set   Nurse Communication Mobility status;Other (comment)(needs new pure wick)        Time: 1552-1610 OT Time Calculation (min): 18 min  Charges: OT General Charges $OT Visit: 1 Visit OT Treatments $Self Care/Home Management : 8-22 mins  Lanier Clam., COTA/L Acute Rehabilitation Services [phone] [place] 12/03/2019, 4:21 PM

## 2019-12-04 ENCOUNTER — Inpatient Hospital Stay: Payer: Medicare HMO | Attending: Internal Medicine

## 2019-12-04 ENCOUNTER — Other Ambulatory Visit: Payer: Self-pay

## 2019-12-04 ENCOUNTER — Inpatient Hospital Stay (HOSPITAL_COMMUNITY)
Admission: RE | Admit: 2019-12-04 | Discharge: 2019-12-18 | DRG: 055 | Disposition: A | Discharge status: Home Health | Payer: Medicare HMO | Source: Intra-hospital | Attending: Physical Medicine and Rehabilitation | Admitting: Physical Medicine and Rehabilitation

## 2019-12-04 DIAGNOSIS — Z7189 Other specified counseling: Secondary | ICD-10-CM | POA: Diagnosis not present

## 2019-12-04 DIAGNOSIS — Z9181 History of falling: Secondary | ICD-10-CM | POA: Diagnosis not present

## 2019-12-04 DIAGNOSIS — I629 Nontraumatic intracranial hemorrhage, unspecified: Secondary | ICD-10-CM

## 2019-12-04 DIAGNOSIS — R4587 Impulsiveness: Secondary | ICD-10-CM | POA: Diagnosis present

## 2019-12-04 DIAGNOSIS — Z86718 Personal history of other venous thrombosis and embolism: Secondary | ICD-10-CM

## 2019-12-04 DIAGNOSIS — Z923 Personal history of irradiation: Secondary | ICD-10-CM | POA: Diagnosis not present

## 2019-12-04 DIAGNOSIS — R569 Unspecified convulsions: Secondary | ICD-10-CM | POA: Diagnosis present

## 2019-12-04 DIAGNOSIS — W19XXXD Unspecified fall, subsequent encounter: Secondary | ICD-10-CM | POA: Diagnosis present

## 2019-12-04 DIAGNOSIS — C3490 Malignant neoplasm of unspecified part of unspecified bronchus or lung: Secondary | ICD-10-CM | POA: Diagnosis present

## 2019-12-04 DIAGNOSIS — Z515 Encounter for palliative care: Secondary | ICD-10-CM | POA: Diagnosis not present

## 2019-12-04 DIAGNOSIS — Z888 Allergy status to other drugs, medicaments and biological substances status: Secondary | ICD-10-CM | POA: Diagnosis not present

## 2019-12-04 DIAGNOSIS — I69198 Other sequelae of nontraumatic intracerebral hemorrhage: Secondary | ICD-10-CM | POA: Diagnosis not present

## 2019-12-04 DIAGNOSIS — D72829 Elevated white blood cell count, unspecified: Secondary | ICD-10-CM | POA: Diagnosis present

## 2019-12-04 DIAGNOSIS — Z8249 Family history of ischemic heart disease and other diseases of the circulatory system: Secondary | ICD-10-CM | POA: Diagnosis not present

## 2019-12-04 DIAGNOSIS — E860 Dehydration: Secondary | ICD-10-CM | POA: Diagnosis present

## 2019-12-04 DIAGNOSIS — S0219XA Other fracture of base of skull, initial encounter for closed fracture: Secondary | ICD-10-CM

## 2019-12-04 DIAGNOSIS — E785 Hyperlipidemia, unspecified: Secondary | ICD-10-CM | POA: Diagnosis present

## 2019-12-04 DIAGNOSIS — I69193 Ataxia following nontraumatic intracerebral hemorrhage: Secondary | ICD-10-CM | POA: Diagnosis not present

## 2019-12-04 DIAGNOSIS — Z9049 Acquired absence of other specified parts of digestive tract: Secondary | ICD-10-CM | POA: Diagnosis not present

## 2019-12-04 DIAGNOSIS — G629 Polyneuropathy, unspecified: Secondary | ICD-10-CM | POA: Diagnosis present

## 2019-12-04 DIAGNOSIS — T380X5A Adverse effect of glucocorticoids and synthetic analogues, initial encounter: Secondary | ICD-10-CM | POA: Diagnosis present

## 2019-12-04 DIAGNOSIS — Y92239 Unspecified place in hospital as the place of occurrence of the external cause: Secondary | ICD-10-CM | POA: Diagnosis present

## 2019-12-04 DIAGNOSIS — N179 Acute kidney failure, unspecified: Secondary | ICD-10-CM

## 2019-12-04 DIAGNOSIS — I69122 Dysarthria following nontraumatic intracerebral hemorrhage: Secondary | ICD-10-CM

## 2019-12-04 DIAGNOSIS — I951 Orthostatic hypotension: Secondary | ICD-10-CM | POA: Diagnosis not present

## 2019-12-04 DIAGNOSIS — F4322 Adjustment disorder with anxiety: Secondary | ICD-10-CM | POA: Diagnosis present

## 2019-12-04 DIAGNOSIS — N390 Urinary tract infection, site not specified: Secondary | ICD-10-CM

## 2019-12-04 DIAGNOSIS — I871 Compression of vein: Secondary | ICD-10-CM | POA: Diagnosis present

## 2019-12-04 DIAGNOSIS — C7931 Secondary malignant neoplasm of brain: Secondary | ICD-10-CM | POA: Diagnosis present

## 2019-12-04 DIAGNOSIS — Z79899 Other long term (current) drug therapy: Secondary | ICD-10-CM

## 2019-12-04 DIAGNOSIS — Z87891 Personal history of nicotine dependence: Secondary | ICD-10-CM | POA: Diagnosis not present

## 2019-12-04 LAB — BASIC METABOLIC PANEL
Anion gap: 11 (ref 5–15)
BUN: 25 mg/dL — ABNORMAL HIGH (ref 8–23)
CO2: 22 mmol/L (ref 22–32)
Calcium: 9.3 mg/dL (ref 8.9–10.3)
Chloride: 106 mmol/L (ref 98–111)
Creatinine, Ser: 0.78 mg/dL (ref 0.44–1.00)
GFR calc Af Amer: >60 mL/min (ref >60)
GFR calc non Af Amer: >60 mL/min (ref >60)
Glucose, Bld: 113 mg/dL — ABNORMAL HIGH (ref 70–99)
Potassium: 4.5 mmol/L (ref 3.5–5.1)
Sodium: 139 mmol/L (ref 135–145)

## 2019-12-04 LAB — CBC
HCT: 43.4 % (ref 36.0–46.0)
Hemoglobin: 14.8 g/dL (ref 12.0–15.0)
MCH: 30.5 pg (ref 26.0–34.0)
MCHC: 34.1 g/dL (ref 30.0–36.0)
MCV: 89.5 fL (ref 80.0–100.0)
Platelets: 298 10*3/uL (ref 150–400)
RBC: 4.85 MIL/uL (ref 3.87–5.11)
RDW: 12.3 % (ref 11.5–15.5)
WBC: 12.3 10*3/uL — ABNORMAL HIGH (ref 4.0–10.5)
nRBC: 0 % (ref 0.0–0.2)

## 2019-12-04 LAB — MAGNESIUM: Magnesium: 2.2 mg/dL (ref 1.7–2.4)

## 2019-12-04 MED ORDER — ACETAMINOPHEN 325 MG PO TABS: 325.0000 mg | ORAL_TABLET | ORAL | Status: DC | PRN

## 2019-12-04 MED ORDER — DEXAMETHASONE SODIUM PHOSPHATE 4 MG/ML IJ SOLN
4.0000 mg | Freq: Every day | INTRAMUSCULAR | Status: AC
  Administered 2019-12-05 – 2019-12-08 (×4): 4 mg via INTRAVENOUS
  Filled 2019-12-04 (×4): qty 1

## 2019-12-04 MED ORDER — TRAZODONE HCL 50 MG PO TABS: 25.0000 mg | ORAL_TABLET | Freq: Every evening | ORAL | Status: DC | PRN

## 2019-12-04 MED ORDER — DOCUSATE SODIUM 100 MG PO CAPS
100.0000 mg | ORAL_CAPSULE | Freq: Every day | ORAL | Status: DC
  Administered 2019-12-05 – 2019-12-18 (×13): 100 mg via ORAL
  Filled 2019-12-04 (×14): qty 1

## 2019-12-04 MED ORDER — BISACODYL 10 MG RE SUPP: 10.0000 mg | Freq: Every day | RECTAL | Status: DC | PRN

## 2019-12-04 MED ORDER — ALUM & MAG HYDROXIDE-SIMETH 200-200-20 MG/5ML PO SUSP
30.0000 mL | ORAL | Status: DC | PRN
  Administered 2019-12-12 (×2): 30 mL via ORAL
  Filled 2019-12-04 (×2): qty 30

## 2019-12-04 MED ORDER — ENOXAPARIN SODIUM 40 MG/0.4ML ~~LOC~~ SOLN
40.0000 mg | SUBCUTANEOUS | Status: DC
  Administered 2019-12-05 – 2019-12-17 (×14): 40 mg via SUBCUTANEOUS
  Filled 2019-12-04 (×14): qty 0.4

## 2019-12-04 MED ORDER — LEVETIRACETAM 500 MG PO TABS
500.0000 mg | ORAL_TABLET | Freq: Two times a day (BID) | ORAL | Status: DC
  Administered 2019-12-04 – 2019-12-15 (×22): 500 mg via ORAL
  Filled 2019-12-04 (×22): qty 1

## 2019-12-04 MED ORDER — ONDANSETRON HCL 4 MG PO TABS: 4.0000 mg | ORAL_TABLET | Freq: Four times a day (QID) | ORAL | Status: DC | PRN

## 2019-12-04 MED ORDER — LEVETIRACETAM 500 MG PO TABS: 500.0000 mg | ORAL_TABLET | Freq: Two times a day (BID) | ORAL | 1 refills | Status: DC

## 2019-12-04 MED ORDER — DEXAMETHASONE 4 MG PO TABS: 4.0000 mg | ORAL_TABLET | Freq: Every day | ORAL | 0 refills | Status: DC

## 2019-12-04 MED ORDER — DIPHENHYDRAMINE HCL 12.5 MG/5ML PO ELIX: 12.5000 mg | ORAL_SOLUTION | Freq: Four times a day (QID) | ORAL | Status: DC | PRN

## 2019-12-04 MED ORDER — GUAIFENESIN-DM 100-10 MG/5ML PO SYRP: 5.0000 mL | ORAL_SOLUTION | Freq: Four times a day (QID) | ORAL | Status: DC | PRN

## 2019-12-04 MED ORDER — FLEET ENEMA 7-19 GM/118ML RE ENEM: 1.0000 | ENEMA | Freq: Once | RECTAL | Status: DC | PRN

## 2019-12-04 MED ORDER — POLYETHYLENE GLYCOL 3350 17 G PO PACK: 17.0000 g | PACK | Freq: Every day | ORAL | Status: DC | PRN

## 2019-12-04 MED ORDER — ONDANSETRON HCL 4 MG/2ML IJ SOLN: 4.0000 mg | Freq: Four times a day (QID) | INTRAMUSCULAR | Status: DC | PRN

## 2019-12-04 NOTE — Progress Notes (Signed)
Physical Therapy Treatment Patient Details Name: Hailey Hill MRN: [id number] DOB: 1951/04/25 Today's Date: 12/04/2019    History of Present Illness 69 year old female with known metastatic disease has undergone radiation treatment and stereotactic radiosurgery to multiple brain mets primarily cerebellum.  Presented with altered mental status last night showing small amount of hemorrhage around a known right cerebellar hemisphere lesion.  Vermian lesion stable additional lesions also noted that were not there on MRI scan a month ago.    PT Comments     Pt supine in bed on arrival soiled in urine from failed purewick.  Pt continues to require moderate assistance to mobilize, with poor balance, poor coordination and decreased strength.  Pt continues to benefit from aggressive rehab at CIR to improve strength and function and decrease care giver burden before returning home.    Follow Up Recommendations  CIR;Supervision/Assistance - 24 hour     Equipment Recommendations  Rolling walker with 5" wheels    Recommendations for Other Services Rehab consult     Precautions / Restrictions Precautions Precautions: Fall Restrictions Weight Bearing Restrictions: No    Mobility  Bed Mobility Overal bed mobility: Needs Assistance Bed Mobility: Supine to Sit     Supine to sit: Mod assist     General bed mobility comments: Min assistance to roll to side and moderate assistance to elevate trunk into sitting and maintain trunk control.  Transfers Overall transfer level: Needs assistance Equipment used: Rolling walker (2 wheeled) Transfers: Sit to/from Stand Sit to Stand: Min assist;Mod assist         General transfer comment: pt required min a for sit>stand from elevated EOB and MOD A from straight back chair.  Ambulation/Gait Ambulation/Gait assistance: Mod assist;Min assist Gait Distance (Feet): 100 Feet Assistive device: Rolling walker (2 wheeled) Gait Pattern/deviations:  Step-through pattern;Narrow base of support;Ataxic;Decreased stride length;Scissoring Gait velocity: decreased   General Gait Details: Pt continues to present with R sided lean and narrow BOS.  She does show improved foot clearance on R side.  Assistance to maintain balance.  LOB noted with backing to seated surface.   Stairs             Wheelchair Mobility    Modified Rankin (Stroke Patients Only) Modified Rankin (Stroke Patients Only) Pre-Morbid Rankin Score: No symptoms Modified Rankin: Moderately severe disability     Balance Overall balance assessment: Needs assistance   Sitting balance-Leahy Scale: Fair       Standing balance-Leahy Scale: Poor Standing balance comment: reliant on external support and BUE support                            Cognition Arousal/Alertness: Awake/alert Behavior During Therapy: WFL for tasks assessed/performed;Impulsive Overall Cognitive Status: Impaired/Different from baseline Area of Impairment: Orientation;Safety/judgement;Problem solving;Awareness;Following commands                 Orientation Level: Disoriented to;Time     Following Commands: Follows one step commands with increased time Safety/Judgement: Decreased awareness of safety;Decreased awareness of deficits Awareness: Intellectual Problem Solving: Difficulty sequencing;Requires verbal cues;Slow processing General Comments: pt impulsive throughout session and distracted in busy environment. pt with poor safety awareness needing cues during mobility      Exercises General Exercises - Lower Extremity Hip Flexion/Marching: AROM;Both;10 reps;Standing Heel Raises: AROM;Both;10 reps;Standing Mini-Sqauts: AROM;Both;10 reps;Standing    General Comments        Pertinent Vitals/Pain Pain Assessment: Faces Faces Pain Scale: No hurt  Home Living                      Prior Function            PT Goals (current goals can now be found in  the care plan section) Acute Rehab PT Goals Patient Stated Goal: none stated, agreeable to therapy Potential to Achieve Goals: Good Progress towards PT goals: Progressing toward goals    Frequency    Min 4X/week      PT Plan Current plan remains appropriate    Co-evaluation              AM-PAC PT "6 Clicks" Mobility   Outcome Measure  Help needed turning from your back to your side while in a flat bed without using bedrails?: A Little Help needed moving from lying on your back to sitting on the side of a flat bed without using bedrails?: A Lot Help needed moving to and from a bed to a chair (including a wheelchair)?: A Lot Help needed standing up from a chair using your arms (e.g., wheelchair or bedside chair)?: A Lot Help needed to walk in hospital room?: A Lot Help needed climbing 3-5 steps with a railing? : A Lot 6 Click Score: 13    End of Session Equipment Utilized During Treatment: Gait belt Activity Tolerance: Patient tolerated treatment well Patient left: in chair;with nursing/sitter in room(sitting in straight back chair while nursing made her bed.) Nurse Communication: Mobility status PT Visit Diagnosis: Unsteadiness on feet (R26.81);Difficulty in walking, not elsewhere classified (R26.2)     Time: 3704-8889 PT Time Calculation (min) (ACUTE ONLY): 25 min  Charges:  $Gait Training: 8-22 mins $Therapeutic Exercise: 8-22 mins                     Erasmo Leventhal , PTA Acute Rehabilitation Services Pager [phone] Office [phone]     Hailey Hill 12/04/2019, 12:50 PM

## 2019-12-04 NOTE — TOC Transition Note (Signed)
Transition of Care [facility]) - CM/SW Discharge Note   Patient Details  Name: SIOMARA BURKEL MRN: [id number] Date of Birth: [birth year]  [facility] CM/SW Contact:  Pollie Friar, RN Phone Number: 12/04/2019, 10:28 AM   Clinical Narrative:    Pt discharging to CIR today. CM signing off.   Final next level of care: IP Rehab Facility Barriers to Discharge: No Barriers Identified   Patient Goals and CMS Choice Patient states their goals for this hospitalization and ongoing recovery are:: to go to inpatient rehab CMS Medicare.gov Compare Post Acute Care list provided to:: Patient Represenative (must comment)(pt spouse) Choice offered to / list presented to : Spouse  Discharge Placement                       Discharge Plan and Services In-house Referral: Clinical Social Work Discharge Planning Services: CM Consult Post Acute Care Choice: IP Rehab                               Social Determinants of Health (SDOH) Interventions     Readmission Risk Interventions Readmission Risk Prevention Plan 12/02/2019  Transportation Screening Complete  PCP or Specialist Appt within 5-7 Days Not Complete  Not Complete comments disposition pending  Home Care Screening Complete  Medication Review (RN CM) Referral to Pharmacy  Some recent data might be hidden

## 2019-12-04 NOTE — H&P (Signed)
Physical Medicine and Rehabilitation Admission H&P    Chief Complaint  Patient presents with  . Metastatic brain cancer with functional deficits.     HPI: Hailey Hill is a 69 year old female with history of SCLC with SVC syndrome, falls with humerus Fx 06/2019 with diagnosis of cerebellar mets treated with XRT, left basal and subclavian vein thrombosis, peripheral neuropathy, who was admitted on 11/27/2019 after being found confused, with slurred speech, blood tinged vomitus, and bowel incontinence. History taken from chart review and patient. CT showing small hemorrhage in right cerebellum without change in mass-effect and small hemorrhagic contusion right temporal lobe.  Neurology was consulted and felt that patient likely with seizure activity leading to LOC, fall and trauma to head.  She was placed on Keppra for seizure prophylaxis and Decadron added to help manage edema.  EEG was negative for epileptiform discharges and mild encephalopathy noted Dr. Saintclair Halsted was consulted for input and felt that patient with progressive brain mets due to additionally lesions likely due to failure of treatment and supportive care recommended.  Dr. Mickeal Skinner was consulted for input and felt that lesions best suited for radiotherapy once patient clinically improved, planning coordinate with Dr. Isidore Moos.  Dr. Lanell Persons to follow-up for input on treatment as well as need for repeat MRI.  He recommended continuing decadron with taper as mentation improved.  Hospital course further complicated by UTI and treated with Rocephin x3 days.  Therapy ongoing and patient continues to have poor safety awareness with impulsivity, decrease in problem-solving and unsteady gait affecting ADLs and mobility.  CIR recommended due to functional decline. Please see preadmission assessment from earlier today.   Review of Systems  Constitutional: Negative for chills and fever.  Musculoskeletal: Positive for joint pain. Negative for myalgias.   Neurological: Positive for speech change. Negative for sensory change, focal weakness and weakness.  All other systems reviewed and are negative.  Past Medical History:  Diagnosis Date  . Complication of anesthesia    had complications with "gas buildup" after tubal lig  . Dyslipidemia   . History of radiation therapy 07/16/19, 07/19/19, 07/22/19   SRS radiation to 2 cerebellum targets.   . Lung cancer ([place]) dx'd 07/2010   sm cell   . Peripheral neuropathy   . SVC syndrome   . Thrombus 02/77/4128   Left basilic vein,  Left subclavian vein    Past Surgical History:  Procedure Laterality Date  . CERVICAL BIOPSY    . CHOLECYSTECTOMY N/A 07/25/2016   Procedure: LAPAROSCOPIC CHOLECYSTECTOMY;  Surgeon: Coralie Keens, MD;  Location: [facility];  Service: General;  Laterality: N/A;  . TONSILLECTOMY    . TUBAL LIGATION      Family History  Problem Relation Age of Onset  . Nephrolithiasis Son   . Heart attack Father   . Heart attack Paternal Uncle   . Heart attack Paternal Grandfather     Social History:  Married. Has been disabled due to  Use cane and furniture walks in home. Husband works nights. She reports that she quit smoking about 9 years ago. She has never used smokeless tobacco. She reports that she does not drink alcohol or use drugs.    Allergies  Allergen Reactions  . Promethazine Hcl Other (See Comments)    Pt reports feeling shaky and "sick" after phenergan    Medications Prior to Admission  Medication Sig Dispense Refill  . cholecalciferol (VITAMIN D3) 25 MCG (1000 UNIT) tablet Take 1,000 Units by  mouth daily.    . vitamin B-12 (CYANOCOBALAMIN) 1000 MCG tablet Take 1,000 mcg by mouth daily.      Drug Regimen Review  Drug regimen was reviewed and remains appropriate with no significant issues identified  Home: Home Living Family/patient expects to be discharged to:: Private residence Living Arrangements: Spouse/significant other Available  Help at Discharge: Family Type of Home: House Home Access: Stairs to enter Technical brewer of Steps: 4 Entrance Stairs-Rails: Right Home Layout: One level Bathroom Shower/Tub: Tub/shower unit Home Equipment: Grab bars - tub/shower   Functional History: Prior Function Level of Independence: Independent  Functional Status:  Mobility: Bed Mobility Overal bed mobility: Needs Assistance Bed Mobility: Supine to Sit, Sit to Supine Supine to sit: Min assist, HOB elevated Sit to supine: Min guard, HOB elevated General bed mobility comments: pt able to progress to EOB with heavy use of bed rails to scoot self to EOb with HOB elevated. pt returned self to supine with min guard for safety Transfers Overall transfer level: Needs assistance Equipment used: Rolling walker (2 wheeled) Transfers: Sit to/from Stand Sit to Stand: Min assist, Mod assist Stand pivot transfers: Mod assist General transfer comment: pt required min a for sit>stand from elevated EOB and MOD A from [facility] needing cues for hand placement and overall technique Ambulation/Gait Ambulation/Gait assistance: Mod assist, Min assist Gait Distance (Feet): 100 Feet Assistive device: Rolling walker (2 wheeled) Gait Pattern/deviations: Step-through pattern, Narrow base of support, Ataxic, Decreased stride length, Scissoring General Gait Details: Pt required assistance to maintain balance.  Pt required increased assistance to turn and for backing.  Noted with RLE scissoring to midline.R lateral lean with intermittent moderate assistance to maintain balance. Gait velocity: decreased    ADL: ADL Overall ADL's : Needs assistance/impaired Grooming: Min guard, Sitting Grooming Details (indicate cue type and reason): min guard for sitting balance due to instability and decreased balance reactions Lower Body Bathing: Supervison/ safety, Set up, Sitting/lateral leans Lower Body Dressing: Minimal assistance, Bed level Lower Body  Dressing Details (indicate cue type and reason): pt able to bridge hips and doff underwear; MIN A to manipulate underwear around purewick Toilet Transfer: Moderate assistance, Ambulation, RW Toilet Transfer Details (indicate cue type and reason): MOD A for safety and balance for simulated toilet transfer via functional mobility with RW. pt needed cues for safety in relation to hand placement and body mechanics Toileting- Clothing Manipulation and Hygiene: +2 for physical assistance, Moderate assistance Functional mobility during ADLs: Moderate assistance, Rolling walker General ADL Comments: session focus on seated LB bathing; LB dressing and functional mobility. pt limited by poor balance and trunk control and generalized weakness  Cognition: Cognition Overall Cognitive Status: Impaired/Different from baseline Arousal/Alertness: Awake/alert(drowsy) Orientation Level: Oriented X4 Attention: Sustained Sustained Attention: Appears intact Memory: Impaired Memory Impairment: Decreased short term memory Problem Solving: Appears intact Executive Function: Reasoning Reasoning: Appears intact Cognition Arousal/Alertness: Awake/alert Behavior During Therapy: WFL for tasks assessed/performed, Impulsive Overall Cognitive Status: Impaired/Different from baseline Area of Impairment: Orientation, Safety/judgement, Problem solving, Awareness, Following commands Orientation Level: Disoriented to, Time Following Commands: Follows one step commands with increased time Safety/Judgement: Decreased awareness of safety, Decreased awareness of deficits Awareness: Intellectual Problem Solving: Difficulty sequencing, Requires verbal cues, Slow processing General Comments: pt impulsive throughout session and distracted in busy environment. pt with poor safety awareness needing cues during mobility  Blood pressure 105/72, pulse 70, temperature 98.4 F (36.9 C), temperature source Oral, resp. rate 18, SpO2 98  %. Physical Exam  Vitals reviewed. Constitutional: She  appears well-developed and well-nourished.  HENT:  Head: Normocephalic and atraumatic.  Eyes: EOM are normal. Right eye exhibits no discharge. Left eye exhibits no discharge.  Neck: No tracheal deviation present. No thyromegaly present.  Respiratory: Effort normal. No stridor. No respiratory distress.  GI: Soft. She exhibits no distension.  Musculoskeletal:     Comments: No edema or tenderness in extremities  Neurological: She is alert.  Dysarthria Motor: Grossly 5/5 throughout Minimal ataxia right upper extremity  Skin: Skin is warm.  Psychiatric: She has a normal mood and affect. Her behavior is normal.    Results for orders placed or performed during the hospital encounter of 11/27/19 (from the past 48 hour(s))  Basic metabolic panel     Status: Abnormal   Collection Time: 12/04/19  4:20 AM  Result Value Ref Range   Sodium 139 135 - 145 mmol/L   Potassium 4.5 3.5 - 5.1 mmol/L   Chloride 106 98 - 111 mmol/L   CO2 22 22 - 32 mmol/L   Glucose, Bld 113 (H) 70 - 99 mg/dL   BUN 25 (H) 8 - 23 mg/dL   Creatinine, Ser 0.78 0.44 - 1.00 mg/dL   Calcium 9.3 8.9 - 10.3 mg/dL   GFR calc non Af Amer >60 >60 mL/min   GFR calc Af Amer >60 >60 mL/min   Anion gap 11 5 - 15    Comment: Performed at [facility], [place] [address], [place], [place] [address]  CBC     Status: Abnormal   Collection Time: 12/04/19  4:20 AM  Result Value Ref Range   WBC 12.3 (H) 4.0 - 10.5 K/uL   RBC 4.85 3.87 - 5.11 MIL/uL   Hemoglobin 14.8 12.0 - 15.0 g/dL   HCT 43.4 36.0 - 46.0 %   MCV 89.5 80.0 - 100.0 fL   MCH 30.5 26.0 - 34.0 pg   MCHC 34.1 30.0 - 36.0 g/dL   RDW 12.3 11.5 - 15.5 %   Platelets 298 150 - 400 K/uL   nRBC 0.0 0.0 - 0.2 %    Comment: Performed at [facility], [place] [address], [place], [place] [address]  Magnesium     Status: None   Collection Time: 12/04/19  4:20 AM  Result Value Ref Range   Magnesium 2.2 1.7 - 2.4 mg/dL     Comment: Performed at [facility] [address], [place], [place] [address]   No results found.     Medical Problem List and Plan: 1. Poor safety awareness with impulsivity, decrease in problem-solving, and unsteady gait affecting ADLs and mobility secondary to cerebellar mets.   -patient may shower  -ELOS/Goals:-13 days/supervision/min A  Admit to CIR 2.  Antithrombotics: -DVT/anticoagulation:  Will add SQ lovenox as 7 days out from bleed and high risk for DVTs  -antiplatelet therapy: N/A 3. Pain Management: tylenol prn.  4. Mood: LCSW to follow for evaluation and support.   -antipsychotic agents: N/a 5. Neuropsych: This patient is not fully capable of making decisions on her own behalf. 6. Skin/Wound Care: Routine pressure relief measures.  7. Fluids/Electrolytes/Nutrition:  Monitor I/O.  CMP ordered for tomorrow. 8. 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 Cont 5 mg daily until Neuro/Onc follow up. 9. Acute  AKI: Encourage fluid intake to help resolve dehydration.  CMP ordered for tomorrow. 10. Leucocytosis: Monitor for signs of infection. Will order UCS to monitor for efficacy of treatment.   CBC ordered  for tomorrow.  Bary Leriche, PA-C 12/04/2019  I have personally performed a face to face diagnostic evaluation, including, but not limited to relevant history and physical exam findings, of this patient and developed relevant assessment and plan.  Additionally, I have reviewed and concur with the physician assistant's documentation above.  Delice Lesch, MD, ABPMR

## 2019-12-04 NOTE — Progress Notes (Signed)
Report called to RN.  Will transfer patient to 4M7.

## 2019-12-04 NOTE — Progress Notes (Signed)
Physical Medicine and Rehabilitation Consult   Reason for Consult: Metastatic brain cancer Referring Physician: Dr. Marva Panda     HPI: Hailey Hill is a 69 y.o. 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 CT head done showing small hemorrhage in right cerebellum without change in mass effect and small hemorrhagic contusion in right temporal lobe. Neurology consulted and felt that patient likely with seizure activity leading to LOC, fall and trauma to her head. She was placed on Keppra and decadron added for cerebellar edema. Dr. Saintclair Halsted consulted for input and felt patient with brain mets with additions lesions likely due to failure of tx and supportive care recommended. Dr. Lanell Persons and Dr. Mickeal Skinner  to follow for input on next step.  Therapy evaluations completed revealing mild to moderate cognitive deficits with delayed processing, nausea with activity, decreased motor planning, diplopia and balance deficits affecting ADLs and mobility. CIR recommended due to functional decline.     Review of Systems  Constitutional: Negative for fever.  HENT: Negative for hearing loss and tinnitus.   Eyes: Negative for blurred vision.  Respiratory: Negative for cough and shortness of breath.   Cardiovascular: Negative for chest pain, palpitations and leg swelling.  Gastrointestinal: Negative for constipation, heartburn and nausea.  Genitourinary: Negative for dysuria and urgency.  Musculoskeletal: Positive for falls (reports that balance is not too good). Negative for back pain and myalgias.  Skin: Negative for itching and rash.  Neurological: Positive for seizures and weakness. Negative for dizziness and headaches.  Psychiatric/Behavioral: Negative for  depression. The patient is not nervous/anxious.             Past Medical History:  Diagnosis Date  . Complication of anesthesia      had complications with "gas buildup" after tubal lig  . Dyslipidemia    . History of radiation therapy 07/16/19, 07/19/19, 07/22/19    SRS radiation to 2 cerebellum targets.   . Lung cancer ([place]) dx'd 07/2010    sm cell   . Peripheral neuropathy    . SVC syndrome    . Thrombus 70/62/3762    Left basilic vein,  Left subclavian vein           Past Surgical History:  Procedure Laterality Date  . CERVICAL BIOPSY      . CHOLECYSTECTOMY N/A 07/25/2016    Procedure: LAPAROSCOPIC CHOLECYSTECTOMY;  Surgeon: Coralie Keens, MD;  Location: [facility];  Service: General;  Laterality: N/A;  . TONSILLECTOMY      . TUBAL LIGATION               Family History  Problem Relation Age of Onset  . Nephrolithiasis Son    . Heart attack Father    . Heart attack Paternal Uncle    . Heart attack Paternal Grandfather        Social History: Married. Disabled? --uses cane and furniture walks at times. Husband was in maintenance for [employer]. She  reports that she quit smoking about 9 years ago. She has never used smokeless tobacco. She reports that she does not drink alcohol or use drugs.           Allergies  Allergen Reactions  . Promethazine Hcl Other (See Comments)  Pt reports feeling shaky and "sick" after phenergan            Medications Prior to Admission  Medication Sig Dispense Refill  . cholecalciferol (VITAMIN D3) 25 MCG (1000 UNIT) tablet Take 1,000 Units by mouth daily.      . vitamin B-12 (CYANOCOBALAMIN) 1000 MCG tablet Take 1,000 mcg by mouth daily.          Home: Home Living Family/patient expects to be discharged to:: Private residence Living Arrangements: Spouse/significant other Available Help at Discharge: Family Type of Home: House Home Access: Stairs to enter Technical brewer of Steps: 4 Entrance  Stairs-Rails: Right Home Layout: One level Bathroom Shower/Tub: Tub/shower unit Home Equipment: Grab bars - tub/shower  Functional History: Prior Function Level of Independence: Independent Functional Status:  Mobility: Bed Mobility Overal bed mobility: Needs Assistance Bed Mobility: Supine to Sit Supine to sit: Mod assist, +2 for safety/equipment General bed mobility comments: ModA to pull up to sit, and guide legs out of bed Transfers Overall transfer level: Needs assistance Equipment used: 2 person hand held assist, Rolling walker (2 wheeled) Transfers: Sit to/from Stand, Stand Pivot Transfers Sit to Stand: +2 safety/equipment, Mod assist Stand pivot transfers: Mod assist, +2 safety/equipment General transfer comment: ModA (+2 for safety) to stand with HHA and transfer on and off BSC. With walker, slightly more stable, displaying step to pattern and narrow BOS for pivotal steps from bed to recliner.  ADL: ADL Overall ADL's : Needs assistance/impaired Grooming: Min guard, Sitting Grooming Details (indicate cue type and reason): min guard for sitting balance due to instability and decreased balance reactions Lower Body Dressing: Maximal assistance Lower Body Dressing Details (indicate cue type and reason): Pt reports nausea when weight shifting to begin reaching towards feet, unable to complete due to nausea and decreased sustained attention to task Toilet Transfer: Moderate assistance, +2 for safety/equipment, BSC Toilet Transfer Details (indicate cue type and reason): Min assit for sit > stand difficulty with marching in place to simulate ambulation forward, therefore completed stand pivot transfer to [facility] with mod assist and +2 for safety due to decreased BLE motor control Toileting- Clothing Manipulation and Hygiene: +2 for physical assistance, Moderate assistance Functional mobility during ADLs: +2 for safety/equipment, Moderate assistance, Minimal assistance General ADL  Comments: Min assist sit> stand, Mod assist for stand pivot and short distance ambulation with RW +2 for safety   Cognition: Cognition Overall Cognitive Status: Impaired/Different from baseline Arousal/Alertness: Awake/alert(drowsy) Orientation Level: Oriented X4 Attention: Sustained Sustained Attention: Appears intact Memory: Impaired Memory Impairment: Decreased short term memory Problem Solving: Appears intact Executive Function: Reasoning Reasoning: Appears intact Cognition Arousal/Alertness: Awake/alert Behavior During Therapy: Flat affect Overall Cognitive Status: Impaired/Different from baseline Area of Impairment: Orientation, Safety/judgement, Problem solving, Awareness, Following commands Orientation Level: Disoriented to, Situation Following Commands: Follows one step commands inconsistently Safety/Judgement: Decreased awareness of safety, Decreased awareness of deficits Awareness: Intellectual Problem Solving: Difficulty sequencing, Requires verbal cues, Slow processing General Comments: A&Ox3, not oriented to situation. Slow psychomotor processing, slightly impulsive, decreased awareness of safety/deficits.     Blood pressure 131/62, pulse 87, temperature 97.9 F (36.6 C), temperature source Oral, resp. rate 18, SpO2 98 %.   Physical Exam  Nursing note and vitals reviewed. General: Alert and oriented x 3, No apparent distress HEENT: Head is normocephalic, atraumatic, PERRLA, EOMI, sclera anicteric, oral mucosa pink and moist, dentition intact, ext ear canals clear, Right ptosis but able to open eyes with cues. Neck: Supple without JVD or lymphadenopathy Heart: Reg  rate and rhythm. No murmurs rubs or gallops Chest: CTA bilaterally without wheezes, rales, or rhonchi; no distress Abdomen: Soft, non-tender, non-distended, bowel sounds positive. Extremities: No clubbing, cyanosis, or edema. Pulses are 2+ Skin: Clean and intact without signs of breakdown Neurological:  She is easily aroused. She appears lethargic. Right facial weakness with right ptosis.Anarthric. Lethargic and needed cues to stay awake and engage in exam. Able to answer orientation questions -date, DOB, president and situation. Able to follow simple one step motor commands.  Difficulty spelling WORLD backwards. 4+/5 strength throughout except 4-/5 in LLE. Psych: Pt's affect is appropriate. Pt is cooperative   Lab Results Last 24 Hours       Results for orders placed or performed during the hospital encounter of 11/27/19 (from the past 24 hour(s))  Protime-INR     Status: None    Collection Time: 11/27/19  7:39 PM  Result Value Ref Range    Prothrombin Time 12.6 11.4 - 15.2 seconds    INR 1.0 0.8 - 1.2  APTT     Status: Abnormal    Collection Time: 11/27/19  7:39 PM  Result Value Ref Range    aPTT 23 (L) 24 - 36 seconds  CBC     Status: Abnormal    Collection Time: 11/27/19  7:39 PM  Result Value Ref Range    WBC 17.3 (H) 4.0 - 10.5 K/uL    RBC 4.80 3.87 - 5.11 MIL/uL    Hemoglobin 14.9 12.0 - 15.0 g/dL    HCT 44.1 36.0 - 46.0 %    MCV 91.9 80.0 - 100.0 fL    MCH 31.0 26.0 - 34.0 pg    MCHC 33.8 30.0 - 36.0 g/dL    RDW 12.5 11.5 - 15.5 %    Platelets 276 150 - 400 K/uL    nRBC 0.0 0.0 - 0.2 %  Differential     Status: Abnormal    Collection Time: 11/27/19  7:39 PM  Result Value Ref Range    Neutrophils Relative % 90 %    Neutro Abs 15.7 (H) 1.7 - 7.7 K/uL    Lymphocytes Relative 4 %    Lymphs Abs 0.6 (L) 0.7 - 4.0 K/uL    Monocytes Relative 5 %    Monocytes Absolute 0.8 0.1 - 1.0 K/uL    Eosinophils Relative 0 %    Eosinophils Absolute 0.0 0.0 - 0.5 K/uL    Basophils Relative 0 %    Basophils Absolute 0.1 0.0 - 0.1 K/uL    Immature Granulocytes 1 %    Abs Immature Granulocytes 0.11 (H) 0.00 - 0.07 K/uL  Comprehensive metabolic panel     Status: Abnormal    Collection Time: 11/27/19  7:39 PM  Result Value Ref Range    Sodium 140 135 - 145 mmol/L    Potassium 3.5 3.5 -  5.1 mmol/L    Chloride 107 98 - 111 mmol/L    CO2 18 (L) 22 - 32 mmol/L    Glucose, Bld 158 (H) 70 - 99 mg/dL    BUN 12 8 - 23 mg/dL    Creatinine, Ser 0.94 0.44 - 1.00 mg/dL    Calcium 9.5 8.9 - 10.3 mg/dL    Total Protein 6.7 6.5 - 8.1 g/dL    Albumin 4.1 3.5 - 5.0 g/dL    AST 19 15 - 41 U/L    ALT 14 0 - 44 U/L    Alkaline Phosphatase 76 38 - 126 U/L  Total Bilirubin 0.7 0.3 - 1.2 mg/dL    GFR calc non Af Amer >60 >60 mL/min    GFR calc Af Amer >60 >60 mL/min    Anion gap 15 5 - 15  CBG monitoring, ED     Status: Abnormal    Collection Time: 11/27/19  7:41 PM  Result Value Ref Range    Glucose-Capillary 152 (H) 70 - 99 mg/dL  I-stat chem 8, ED     Status: Abnormal    Collection Time: 11/27/19  7:45 PM  Result Value Ref Range    Sodium 139 135 - 145 mmol/L    Potassium 3.5 3.5 - 5.1 mmol/L    Chloride 104 98 - 111 mmol/L    BUN 13 8 - 23 mg/dL    Creatinine, Ser 0.70 0.44 - 1.00 mg/dL    Glucose, Bld 150 (H) 70 - 99 mg/dL    Calcium, Ion 1.18 1.15 - 1.40 mmol/L    TCO2 22 22 - 32 mmol/L    Hemoglobin 14.6 12.0 - 15.0 g/dL    HCT 43.0 36.0 - 46.0 %  Ethanol     Status: None    Collection Time: 11/27/19  8:16 PM  Result Value Ref Range    Alcohol, Ethyl (B) <10 <10 mg/dL  Respiratory Panel by RT PCR (Flu A&B, Covid) - Nasopharyngeal Swab     Status: None    Collection Time: 11/27/19  8:28 PM    Specimen: Nasopharyngeal Swab  Result Value Ref Range    SARS Coronavirus 2 by RT PCR NEGATIVE NEGATIVE    Influenza A by PCR NEGATIVE NEGATIVE    Influenza B by PCR NEGATIVE NEGATIVE  Urine rapid drug screen (hosp performed)     Status: None    Collection Time: 11/27/19  9:39 PM  Result Value Ref Range    Opiates NONE DETECTED NONE DETECTED    Cocaine NONE DETECTED NONE DETECTED    Benzodiazepines NONE DETECTED NONE DETECTED    Amphetamines NONE DETECTED NONE DETECTED    Tetrahydrocannabinol NONE DETECTED NONE DETECTED    Barbiturates NONE DETECTED NONE DETECTED    Urinalysis, Routine w reflex microscopic     Status: Abnormal    Collection Time: 11/27/19  9:39 PM  Result Value Ref Range    Color, Urine YELLOW YELLOW    APPearance HAZY (A) CLEAR    Specific Gravity, Urine 1.014 1.005 - 1.030    pH 7.0 5.0 - 8.0    Glucose, UA NEGATIVE NEGATIVE mg/dL    Hgb urine dipstick NEGATIVE NEGATIVE    Bilirubin Urine NEGATIVE NEGATIVE    Ketones, ur 5 (A) NEGATIVE mg/dL    Protein, ur NEGATIVE NEGATIVE mg/dL    Nitrite NEGATIVE NEGATIVE    Leukocytes,Ua LARGE (A) NEGATIVE    RBC / HPF 6-10 0 - 5 RBC/hpf    WBC, UA 21-50 0 - 5 WBC/hpf    Bacteria, UA MANY (A) NONE SEEN    Squamous Epithelial / LPF 0-5 0 - 5  Hemoglobin A1c     Status: None    Collection Time: 11/28/19  4:35 AM  Result Value Ref Range    Hgb A1c MFr Bld 4.8 4.8 - 5.6 %    Mean Plasma Glucose 91.06 mg/dL  Comprehensive metabolic panel     Status: Abnormal    Collection Time: 11/28/19  4:35 AM  Result Value Ref Range    Sodium 139 135 - 145 mmol/L    Potassium 3.5 3.5 -  5.1 mmol/L    Chloride 102 98 - 111 mmol/L    CO2 24 22 - 32 mmol/L    Glucose, Bld 155 (H) 70 - 99 mg/dL    BUN 12 8 - 23 mg/dL    Creatinine, Ser 0.81 0.44 - 1.00 mg/dL    Calcium 9.5 8.9 - 10.3 mg/dL    Total Protein 6.6 6.5 - 8.1 g/dL    Albumin 3.9 3.5 - 5.0 g/dL    AST 18 15 - 41 U/L    ALT 13 0 - 44 U/L    Alkaline Phosphatase 79 38 - 126 U/L    Total Bilirubin 0.7 0.3 - 1.2 mg/dL    GFR calc non Af Amer >60 >60 mL/min    GFR calc Af Amer >60 >60 mL/min    Anion gap 13 5 - 15  Lipid panel     Status: Abnormal    Collection Time: 11/28/19  4:35 AM  Result Value Ref Range    Cholesterol 311 (H) 0 - 200 mg/dL    Triglycerides 84 <150 mg/dL    HDL 53 >40 mg/dL    Total CHOL/HDL Ratio 5.9 RATIO    VLDL 17 0 - 40 mg/dL    LDL Cholesterol 241 (H) 0 - 99 mg/dL       Imaging Results (Last 48 hours)  EEG   Result Date: 11/28/2019 Lora Havens, MD     11/28/2019  9:51 AM Patient Name: KALEA PERINE  MRN: [id number] Epilepsy Attending: Lora Havens Referring Physician/Provider: Dr. 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 Lennox Solders status and slurred speech.  EEG to evaluate for seizures. Level of alertness: Awake, asleep AEDs during EEG study: Keppra Technical aspects: This EEG study was done with scalp electrodes positioned according to the 10-20 International system of electrode placement. Electrical activity was acquired at a sampling rate of 500Hz  and reviewed with a high frequency filter of 70Hz  and a low frequency filter of 1Hz . EEG data were recorded continuously and digitally stored. Description: The posterior dominant rhythm consists of 8 Hz activity of moderate voltage (25-35 uV) seen predominantly in posterior head regions, symmetric and reactive to eye opening and eye closing.  Sleep was characterized by vertex waves, sleep spindles (12 to 14 Hz), maximal frontocentral.  EEG showed continuous generalized and maximal right temporal 3 to 5 Hz theta-delta slowing.  Hyperventilation and photic stimulation were not performed. Abnormality -Continuous slow, generalized, maximal right temporal    IMPRESSION: This study showed evidence of cortical dysfunction in right temporal region likely secondary to underlying hemorrhage as well as mild diffuse encephalopathy, nonspecific etiology. No seizures or epileptiform discharges were seen throughout the recording. Lora Havens    MR Brain W and Wo Contrast   Result Date: 11/28/2019 CLINICAL DATA:  Seizure and intracranial metastatic disease. EXAM: MRI HEAD WITHOUT AND WITH CONTRAST TECHNIQUE: Multiplanar, multiecho pulse sequences of the brain and surrounding structures were obtained without and with intravenous contrast. CONTRAST:  66mL GADAVIST GADOBUTROL 1 MMOL/ML IV SOLN COMPARISON:  10/17/2019 brain MRI Head CT  11/27/2019 FINDINGS: BRAIN: Intraparenchymal hematomas within the right cerebellum and right temporal lobe unchanged. There is severe cerebellar edema but no transtentorial herniation. Small amount of subarachnoid blood over the right temporal lobe is unchanged. The white matter signal is normal for the patient's age. No hydrocephalus. There is heterogeneous contrast enhancement associated  with the right cerebellar hemorrhage. Inferior vermis lesion has increased in size and now measures 3.3 cm. A smaller lesion at the dorsal aspect of the cerebral aqua duct is decreased in size and now measures 1.2 cm. Anterior right cerebellar lesion is new or increased in size, measuring 8 mm (series 22, image 15). VASCULAR: The major intracranial arterial and venous sinus flow voids are normal. Susceptibility-sensitive sequences show no chronic microhemorrhage or superficial siderosis. SKULL AND UPPER CERVICAL SPINE: Left parietal scalp subgaleal hematoma. No focal calvarial lesions. SINUSES/ORBITS: Bilateral mastoid fluid. The orbits are normal. IMPRESSION: 1. Unchanged size of intraparenchymal hematomas within the right cerebellum and right temporal lobe. 2. Severe cerebellar edema without transtentorial or tonsillar herniation. 3. New contrast-enhancing lesions within the right cerebellar hemisphere, consistent with worsening intracranial metastatic disease. No supratentorial metastatic disease. Electronically Signed   By: Ulyses Jarred M.D.   On: 11/28/2019 03:50    DG CHEST PORT 1 VIEW   Result Date: 11/28/2019 CLINICAL DATA:  Nausea and vomiting.  History of stroke. EXAM: PORTABLE CHEST 1 VIEW COMPARISON:  CT 10/29/2019.  Chest x-ray 07/07/2019, 12/14/2015. FINDINGS: Mediastinum appears stable. Heart size stable. Stable changes of pleural-parenchymal scarring noted over the left suprahilar region consistent with prior radiation therapy. Mild bibasilar atelectasis. No prominent pleural effusion. No pneumothorax.  Surgical clips left chest. Thoracic spine scoliosis. IMPRESSION: 1. Stable changes of pleural-parenchymal scarring noted over the left suprahilar region consistent prior radiation therapy. 2.  Mild bibasilar atelectasis. Electronically Signed   By: Marcello Moores  Register   On: 11/28/2019 07:25    CT HEAD CODE STROKE WO CONTRAST   Result Date: 11/27/2019 CLINICAL DATA:  Altered mental status, slurred speech. Last known normal 16:30 EXAM: CT HEAD WITHOUT CONTRAST TECHNIQUE: Contiguous axial images were obtained from the base of the skull through the vertex without intravenous contrast. COMPARISON:  Brain MRI 10/17/2019 FINDINGS: Brain: Again demonstrated is edema within the right greater than left cerebellar hemispheres. Known metastatic lesions within the right cerebellum and midline cerebellar vermis were better appreciated on brain MRI 10/17/2019. Superimposed acute parenchymal hemorrhage within the right cerebellum. The dominant component of the hemorrhage measures 2.6 x 1.9 x 2.2 cm (series 2, image 7) (series 4, image 45). Posterior fossa mass effect appears similar to prior MRI 10/17/2019 with partial effacement of the fourth ventricle. The lateral and third ventricles are similar in size as compared to prior MRI. There is a small hemorrhagic parenchymal contusion within the inferolateral right temporal lobe with a small amount of adjacent acute subarachnoid hemorrhage (series 4, image 32) (series 2, image 13). No supratentorial midline shift. Unchanged generalized parenchymal atrophy. Vascular: No hyperdense vessel. Skull: Although not definitively identified, a left temporal bone fracture is suspected given the presence of a left mastoid effusion and adjacent subcutaneous emphysema. Sinuses/Orbits: Visualized orbits demonstrate no acute abnormality. Mild mucosal thickening within the right maxillary sinus. Left mastoid effusion. Other: There is subcutaneous gas within the left masticator/parapharyngeal space.  Left posterior scalp hematoma. These results were called by telephone at the time of interpretation on 11/27/2019 at 8:02 pm to provider Dr. Lorraine Lax, who verbally acknowledged these results. IMPRESSION: 1. Prominent edema within the right greater than left cerebellar hemispheres at site of known metastases. Superimposed acute parenchymal hemorrhage within the right cerebellum measuring 2.6 x 1.9 x 2.2 cm. Posterior fossa mass effect is similar to prior MRI with partial effacement of the fourth ventricle. 2. Small hemorrhagic contusion within the inferolateral right temporal lobe with adjacent small volume acute  subarachnoid hemorrhage. 3. Although not definitively identified, an acute fracture of the left temporal bone is suspected given the presence of a left mastoid effusion and adjacent soft tissue gas within the left masticator/parapharyngeal space. 4. Left posterior scalp hematoma. Electronically Signed   By: Kellie Simmering DO   On: 11/27/2019 20:19         Assessment/Plan: Diagnosis: ICH 1. Does the need for close, 24 hr/day medical supervision in concert with the patient's rehab needs make it unreasonable for this patient to be served in a less intensive setting? Yes 2. Co-Morbidities requiring supervision/potential complications: metastatic lung cancer to cerebellar vermis, acute lower UTI, left femur fracture, recent fall, chest pain 3. Due to bladder management, bowel management, safety, skin/wound care, disease management, medication administration, pain management and patient education, does the patient require 24 hr/day rehab nursing? Yes 4. Does the patient require coordinated care of a physician, rehab nurse, therapy disciplines of PT, OT to address physical and functional deficits in the context of the above medical diagnosis(es)? Yes Addressing deficits in the following areas: balance, endurance, locomotion, strength, transferring, bowel/bladder control, bathing, dressing, feeding, grooming,  toileting, cognition and psychosocial support 5. Can the patient actively participate in an intensive therapy program of at least 3 hrs of therapy per day at least 5 days per week? Yes 6. The potential for patient to make measurable gains while on inpatient rehab is excellent 7. Anticipated functional outcomes upon discharge from inpatient rehab are modified independent  with PT, modified independent with OT, modified independent with SLP. 8. Estimated rehab length of stay to reach the above functional goals is: 10-14 days 9. Anticipated discharge destination: Home 10. Overall Rehab/Functional Prognosis: excellent   RECOMMENDATIONS: This patient's condition is appropriate for continued rehabilitative care in the following setting: CIR Patient has agreed to participate in recommended program. Yes Note that insurance prior authorization may be required for reimbursement for recommended care.   Comment: Mrs. Welshans would be an excellent CIR candidate. Lethargic on exam but easily arousable and participates well. States that she is sleeping well at night and without pain. Not receiving sedating medications. If lethargy persists in rehabilitation, may benefit from stimulant to improve activity tolerance during the day, such as methylphenidate 5mg  daily.   Bary Leriche, PA-C 11/28/2019    I have personally performed a face to face diagnostic evaluation, including, but not limited to relevant history and physical exam findings, of this patient and developed relevant assessment and plan.  Additionally, I have reviewed and concur with the physician assistant's documentation above.   Leeroy Cha, MD

## 2019-12-04 NOTE — Progress Notes (Signed)
PMR Admission Coordinator Pre-Admission Assessment   Patient: Hailey Hill is an 69 y.o., female MRN: [id number] DOB: 02-Aug-1951 Height:  '5\' 8"'$  Weight:  71.3 kg                                                                                                                                                  Insurance Information HMO: yes    PPO:      PCP:      IPA:      80/20:      OTHER:  PRIMARY: [employer]      Policy#: [id number]      Subscriber: pt CM Name: Leilani Able      Phone#: [phone]     Fax#: [phone] Pre-Cert#: [id number] [place] for CIR admission provided by Leilani Able with [facility] Medicare on 1/26 for 1/27 admission, updates due to [facility] on 2/3 to fax listed above      Employer:  Benefits:  Phone #: [phone]     Name:  Eff. Date: 11/08/19     Deduct: $0      Out of Pocket Max: $3900 (met $0)      Life Max: n/a CIR: $295/day for days  1-6      SNF: 20 full days Outpatient:      Co-Pay: $10-$40/visit depending on service Home Health: 100%      Co-Pay:  DME: 80%     Co-Pay: 20% Providers:  SECONDARY:       Policy#:       Subscriber:  CM Name:       Phone#:      Fax#:  Pre-Cert#:       Employer:  Benefits:  Phone #:      Name:  Eff. Date:      Deduct:       Out of Pocket Max:       Life Max:  CIR:       SNF:  Outpatient:      Co-Pay:  Home Health:       Co-Pay:  DME:      Co-Pay:    Medicaid Application Date:       Case Manager:  Disability Application Date:       Case Worker:    The "Data Collection Information Summary" for patients in Inpatient Rehabilitation Facilities with attached "Privacy Act [place] Records" was provided and verbally reviewed with: Patient and Family   Emergency Contact Information         Contact Information     Name Relation Home Work Mobile    [place] Spouse [phone]   [place] [phone]   [phone]       Current Medical History  Patient Admitting Diagnosis: 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 CT head done showing small hemorrhage in right cerebellum without change in mass effect and small hemorrhagic contusion in right temporal lobe. Neurology consulted and felt that patient likely with seizure activity leading to LOC, fall and trauma to her head. She was placed on Keppra and decadron added for cerebellar edema. Dr. Saintclair Halsted consulted for input and felt patient with brain mets with additions lesions likely due to failure of tx and supportive care recommended. Dr. Lanell Persons and Dr. Mickeal Skinner  to follow for input on next step.  Therapy evaluations completed revealing mild to moderate cognitive deficits with delayed processing, nausea with activity, decreased motor planning, diplopia and balance deficits affecting ADLs and mobility. CIR recommended due to functional decline.   Complete NIHSS TOTAL: 0   Past Medical History      Past Medical History:  Diagnosis Date  . Complication of anesthesia      had complications with "gas buildup" after tubal lig  . Dyslipidemia    . History of radiation therapy 07/16/19, 07/19/19, 07/22/19    SRS radiation to 2 cerebellum targets.   . Lung cancer ([place]) dx'd 07/2010    sm cell   . Peripheral neuropathy    . SVC syndrome    . Thrombus 38/75/6433    Left basilic vein,  Left subclavian vein      Family History  family history includes Heart attack in her father, paternal grandfather, and paternal uncle; Nephrolithiasis in her son.   Prior Rehab/Hospitalizations:  Has the patient had prior rehab or hospitalizations prior to admission? Yes   Has the patient had major surgery during 100 days prior to  admission? No   Current Medications    Current Facility-Administered Medications:  .  0.9 %  sodium chloride infusion, , Intravenous, PRN, Harold Hedge, MD, Last Rate: 10 mL/hr at 11/29/19 2036, 250 mL at 11/29/19 2036 .  acetaminophen (TYLENOL) tablet 650 mg, 650 mg, Oral, Q4H PRN **OR** acetaminophen (TYLENOL) 160 MG/5ML solution 650 mg, 650 mg, Per Tube, Q4H PRN **OR** acetaminophen (TYLENOL) suppository 650 mg, 650 mg, Rectal, Q4H PRN, Jani Gravel, MD .  docusate sodium (COLACE) capsule 100 mg, 100 mg, Oral, Daily, Marva Panda E, MD, 100 mg at 12/04/19 1045 .  hydrALAZINE (APRESOLINE) injection 5 mg, 5 mg, Intravenous, Q6H PRN, Jani Gravel, MD, 5 mg at 11/27/19 2359 .  levETIRAcetam (KEPPRA) tablet 500 mg, 500 mg, Oral, BID, Harold Hedge, MD, 500 mg at 12/04/19 1045 .  ondansetron (ZOFRAN) injection 4 mg, 4 mg, Intravenous, Q6H PRN, Jani Gravel, MD, 4 mg at 11/30/19 1625   Patients Current Diet:     Diet Order                      Diet - low sodium heart healthy           Diet regular Room service appropriate? Yes; Fluid consistency: Thin  Diet effective now                   Precautions / Restrictions Precautions Precautions: Fall Restrictions Weight Bearing Restrictions: No    Has the patient had 2 or more falls or a fall with injury in the past year?Yes  Prior Activity Level Community (5-7x/wk): independent, no AD, not currently driving with known brain mets   Prior Functional Level Prior Function Level of Independence: Independent   Self Care: Did the patient need help bathing, dressing, using the toilet or eating?  Independent   Indoor Mobility: Did the patient need assistance with walking from room to room (with or without device)? Independent   Stairs: Did the patient need assistance with internal or external stairs (with or without device)? Independent   Functional Cognition: Did the patient need help planning regular tasks such as shopping or  remembering to take medications? Independent   Home Assistive Devices / Equipment Home Equipment: Grab bars - tub/shower   Prior Device Use: Indicate devices/aids used by the patient prior to current illness, exacerbation or injury? None of the above   Current Functional Level Cognition   Arousal/Alertness: Awake/alert(drowsy) Overall Cognitive Status: Impaired/Different from baseline Orientation Level: Oriented X4 Following Commands: Follows one step commands with increased time Safety/Judgement: Decreased awareness of safety, Decreased awareness of deficits General Comments: pt impulsive throughout session and distracted in busy environment. pt with poor safety awareness needing cues during mobility Attention: Sustained Sustained Attention: Appears intact Memory: Impaired Memory Impairment: Decreased short term memory Problem Solving: Appears intact Executive Function: Reasoning Reasoning: Appears intact    Extremity Assessment (includes Sensation/Coordination)   Upper Extremity Assessment: Defer to OT evaluation LUE Deficits / Details: Pt reports decreased ROM due to h/o breaking Lt arm  Lower Extremity Assessment: RLE deficits/detail, LLE deficits/detail RLE Deficits / Details: Strength 5/5 LLE Deficits / Details: Strength 5/5     ADLs   Overall ADL's : Needs assistance/impaired Grooming: Min guard, Sitting Grooming Details (indicate cue type and reason): min guard for sitting balance due to instability and decreased balance reactions Lower Body Bathing: Supervison/ safety, Set up, Sitting/lateral leans Lower Body Dressing: Minimal assistance, Bed level Lower Body Dressing Details (indicate cue type and reason): pt able to bridge hips and doff underwear; MIN A to manipulate underwear around purewick Toilet Transfer: Moderate assistance, Ambulation, RW Toilet Transfer Details (indicate cue type and reason): MOD A for safety and balance for simulated toilet transfer via  functional mobility with RW. pt needed cues for safety in relation to hand placement and body mechanics Toileting- Clothing Manipulation and Hygiene: +2 for physical assistance, Moderate assistance Functional mobility during ADLs: Moderate assistance, Rolling walker General ADL Comments: session focus on seated LB bathing; LB dressing and functional mobility. pt limited by poor balance and trunk control and generalized weakness     Mobility   Overal bed mobility: Needs Assistance Bed Mobility: Supine to Sit, Sit to Supine Supine to sit: Min assist, HOB elevated Sit to supine: Min guard, HOB elevated General bed mobility comments: pt able to progress to EOB with heavy use of bed rails to scoot self to EOb with HOB elevated. pt returned self to supine with min guard for safety     Transfers   Overall transfer level: Needs assistance Equipment used: Rolling walker (2 wheeled) Transfers: Sit to/from Stand Sit to Stand: Min assist, Mod assist Stand pivot transfers: Mod assist General transfer comment: pt required min a for sit>stand from elevated EOB and MOD A from [facility] needing cues for hand placement and overall technique     Ambulation / Gait / Stairs / Wheelchair Mobility   Ambulation/Gait Ambulation/Gait assistance: Mod assist, Min assist Gait Distance (Feet): 100 Feet Assistive device: Rolling walker (2 wheeled) Gait Pattern/deviations: Step-through pattern, Narrow base of support,  Ataxic, Decreased stride length, Scissoring General Gait Details: Pt required assistance to maintain balance.  Pt required increased assistance to turn and for backing.  Noted with RLE scissoring to midline.R lateral lean with intermittent moderate assistance to maintain balance. Gait velocity: decreased     Posture / Balance Dynamic Sitting Balance Sitting balance - Comments: close min guard for static sitting Balance Overall balance assessment: Needs assistance Sitting-balance support: Feet  supported Sitting balance-Leahy Scale: Fair Sitting balance - Comments: close min guard for static sitting Standing balance support: During functional activity, Bilateral upper extremity supported Standing balance-Leahy Scale: Poor Standing balance comment: reliant on external support and BUE support     Special needs/care consideration BiPAP/CPAP no CPM no Continuous Drip IV no Dialysis no        Days n/a Life Vest no Oxygen no Special Bed no Trach Size no Wound Vac (area) no      Location n/a Skin intact                              Location  Bowel mgmt: continent Bladder mgmt: incontinent Diabetic mgmt no Behavioral consideration no Chemo/radiation radiation in Sept 2020        Previous Home Environment (from acute therapy documentation) Living Arrangements: Spouse/significant other Available Help at Discharge: Family Type of Home: House Home Layout: One level Home Access: Stairs to enter Entrance Stairs-Rails: Right Entrance Stairs-Number of Steps: 4 Bathroom Shower/Tub: Tub/shower unit   Discharge Living Setting Plans for Discharge Living Setting: Patient's home Type of Home at Discharge: House Discharge Home Layout: One level Discharge Home Access: Stairs to enter Entrance Stairs-Rails: Right Entrance Stairs-Number of Steps: 4 Discharge Bathroom Shower/Tub: Tub/shower unit Discharge Bathroom Toilet: Standard Discharge Bathroom Accessibility: Yes How Accessible: Accessible via walker Does the patient have any problems obtaining your medications?: No   Social/Family/Support Systems Anticipated Caregiver: spouse [name]), and intermittently son [name]) Anticipated Caregiver's Contact Information: [name] [phone], [name] [phone] Ability/Limitations of Caregiver: spouse works, but is aware of need for 24/7 supervision recommendations initially at discharge Caregiver Availability: 24/7 Discharge Plan Discussed with Primary Caregiver: Yes Is Caregiver In  Agreement with Plan?: Yes Does Caregiver/Family have Issues with Lodging/Transportation while Pt is in Rehab?: No     Goals/Additional Needs Patient/Family Goal for Rehab: PT/OT/SLP supervision to mod I Expected length of stay: 10-14 days Dietary Needs: reg/thin Additional Information: Discussed pt would likely need 24/7 supervision at discharge, family vs hired caregivers, and that given pt's diagnosis it was possible that permanent 24/7 supervision/assist might be imminent.   Pt/Family Agrees to Admission and willing to participate: Yes Program Orientation Provided & Reviewed with Pt/Caregiver Including Roles  & Responsibilities: Yes     Decrease burden of Care through IP rehab admission: n/a     Possible need for SNF placement upon discharge: Not anticipated. Family aware that SNF will not likely be approved following an inpatient rehab stay and that recommendations are for, at least initial, 24/7 supervision.   This patient's medical and functional status has changed since the consult dated: 11/28/2019 in which the Rehabilitation Physician determined and documented that the patient's condition is appropriate for intensive rehabilitative care in an inpatient rehabilitation facility. See "History of Present Illness" (above) for medical update. Functional changes are: max to total +2. Patient's medical and functional status update has been discussed with the Rehabilitation physician and patient remains appropriate for inpatient rehabilitation. Will admit to inpatient rehab today.  Preadmission Screen Completed By:  Michel Santee, PT, DPT 12/04/2019 11:10 AM ______________________________________________________________________   Discussed status with Dr. Posey Pronto on 12/04/19 at 11:10 AM  and received approval for admission today.   Admission Coordinator:  Michel Santee, PT, DPT time 11:10 AM Sudie Grumbling 12/04/19      Cosigned by: Jamse Arn, MD at 12/04/2019 11:22 AM

## 2019-12-04 NOTE — Progress Notes (Signed)
Patient admitted to IP Rehab during  Patient was informed of safety policy and visitor policy. Skin assessment preformed and no impairments noted. Adria Devon, LPN

## 2019-12-04 NOTE — Discharge Summary (Signed)
Physician Discharge Summary  Hailey Hill YPP:[id number] DOB: Mar 22, 1951 DOA: 11/27/2019  PCP: [name], NP  Admit date: 11/27/2019 Discharge date: 12/04/2019  Admitted From: Home Disposition: CIR Recommendations for Outpatient Follow-up:  1. Follow up with PCP in 1-2 weeks 2. 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 Earlie Server, oncology, who presented with complaints of nausea/vomiting found down by her husband in yard with last known normal 4:30 PM 1/20.  Since being admitted neurology, neurosurgery and neuro-oncology have been consulted and she has had an EEG done.  Has been getting steroids.  Passed swallow study.  Has been approved for CIR when bed available. Discharge Diagnoses:  Principal Problem:   ICH (intracerebral hemorrhage) (HCC) Active Problems:   Metastatic lung cancer (metastasis from lung to other site) [facility])   Metastatic cancer to brain [facility])   Acute lower UTI  #1 acute change in mental status patient was found down with confusion by her husband.  Patient has small cell lung cancer with mets to the brain.  CT of the brain showed small subarachnoid hemorrhage. MRI of the brain severe cerebellar edema new contrast-enhancing lesions in the right cerebellar hemisphere consistent with mets. EEG showed mild encephalopathy and consistent with hemorrhage. Neurosurgery consulted-No role for surgery per neurosurgery, continue supportive care.Keep SBP<160 per neurology Per neuro-oncology: Clinical and radiographic signs consistent with metastases and subsequent hemorrhagic  conversion with progression of recently treated large midline vermis lesion contributing.  Etiology of progression is likely radial inflammatory change however organic tumor progression not ruled out.  Novel lesions best suited for radiosurgery once subclinical condition stabilizes.  Case to be discussed at brain/spine tumor board meeting. To discuss with Dr. Isidore Moos and possible utility of repeat MRI brain. Change Decadron to 4 mg daily starting today 12/04/2019 per neuro oncology. Follow-up with neuro-oncology on discharge.   #2 UTI finished 3 days of Rocephin. Estimated body mass index is 23.9 kg/m as calculated from the following:   Height as of 10/31/19: 5\' 8"  (1.727 m).   Weight as of 10/31/19: 71.3 kg.  Discharge Instructions  Discharge Instructions    Diet - low sodium heart healthy   Complete by: As directed    Increase activity slowly   Complete by: As directed      Allergies as of 12/04/2019      Reactions   Promethazine Hcl Other (See Comments)   Pt reports feeling shaky and "sick" after phenergan      Medication List    TAKE these medications   cholecalciferol 25 MCG (1000 UNIT) tablet Commonly known as: VITAMIN D3 Take 1,000 Units by mouth daily.   levETIRAcetam 500 MG tablet Commonly known as: KEPPRA Take 1 tablet (500 mg total) by mouth 2 (two) times daily.   vitamin B-12 1000 MCG tablet Commonly known as: CYANOCOBALAMIN Take 1,000 mcg by mouth daily.       Allergies  Allergen Reactions  . Promethazine Hcl Other (See Comments)    Pt reports feeling shaky and "sick" after phenergan    Consultations: Neurology, neurosurgery, neuro oncology  Procedures/Studies: EEG  Result Date: 11/28/2019 Lora Havens, MD     11/28/2019  9:51 AM Patient Name:  Hailey Hill MRN: [id number] Epilepsy Attending: Lora Havens Referring Physician/Provider: Dr. 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 Lennox Solders status and slurred speech.  EEG to evaluate for seizures. Level of alertness: Awake, asleep AEDs during EEG study: Keppra Technical aspects: This EEG study was done with scalp electrodes positioned according to the 10-20 International system of electrode placement. Electrical activity was acquired at a sampling rate of 500Hz  and reviewed with a high frequency filter of 70Hz  and a low frequency filter of 1Hz . EEG data were recorded continuously and digitally stored. Description: The posterior dominant rhythm consists of 8 Hz activity of moderate voltage (25-35 uV) seen predominantly in posterior head regions, symmetric and reactive to eye opening and eye closing.  Sleep was characterized by vertex waves, sleep spindles (12 to 14 Hz), maximal frontocentral.  EEG showed continuous generalized and maximal right temporal 3 to 5 Hz theta-delta slowing.  Hyperventilation and photic stimulation were not performed. Abnormality -Continuous slow, generalized, maximal right temporal    IMPRESSION: This study showed evidence of cortical dysfunction in right temporal region likely secondary to underlying hemorrhage as well as mild diffuse encephalopathy, nonspecific etiology. No seizures or epileptiform discharges were seen throughout the recording. Lora Havens   MR Brain W and Wo Contrast  Result Date: 11/28/2019 CLINICAL DATA:  Seizure and intracranial metastatic disease. EXAM: MRI HEAD WITHOUT AND WITH CONTRAST TECHNIQUE: Multiplanar, multiecho pulse sequences of the brain and surrounding structures were obtained without and with intravenous contrast. CONTRAST:  65mL GADAVIST GADOBUTROL 1 MMOL/ML IV SOLN COMPARISON:  10/17/2019 brain MRI Head CT 11/27/2019 FINDINGS: BRAIN: Intraparenchymal hematomas within the right cerebellum and right temporal lobe unchanged. There is severe cerebellar edema but no transtentorial herniation. Small amount  of subarachnoid blood over the right temporal lobe is unchanged. The white matter signal is normal for the patient's age. No hydrocephalus. There is heterogeneous contrast enhancement associated with the right cerebellar hemorrhage. Inferior vermis lesion has increased in size and now measures 3.3 cm. A smaller lesion at the dorsal aspect of the cerebral aqua duct is decreased in size and now measures 1.2 cm. Anterior right cerebellar lesion is new or increased in size, measuring 8 mm (series 22, image 15). VASCULAR: The major intracranial arterial and venous sinus flow voids are normal. Susceptibility-sensitive sequences show no chronic microhemorrhage or superficial siderosis. SKULL AND UPPER CERVICAL SPINE: Left parietal scalp subgaleal hematoma. No focal calvarial lesions. SINUSES/ORBITS: Bilateral mastoid fluid. The orbits are normal. IMPRESSION: 1. Unchanged size of intraparenchymal hematomas within the right cerebellum and right temporal lobe. 2. Severe cerebellar edema without transtentorial or tonsillar herniation. 3. New contrast-enhancing lesions within the right cerebellar hemisphere, consistent with worsening intracranial metastatic disease. No supratentorial metastatic disease. Electronically Signed   By: Ulyses Jarred M.D.   On: 11/28/2019 03:50   DG CHEST PORT 1 VIEW  Result Date: 11/28/2019 CLINICAL DATA:  Nausea and vomiting.  History of stroke. EXAM: PORTABLE CHEST 1 VIEW COMPARISON:  CT 10/29/2019.  Chest x-ray 07/07/2019, 12/14/2015. FINDINGS: Mediastinum appears stable. Heart size stable. Stable changes of pleural-parenchymal scarring noted over the left suprahilar region consistent with prior radiation therapy. Mild bibasilar atelectasis. No prominent pleural effusion. No pneumothorax. Surgical clips left chest. Thoracic spine scoliosis. IMPRESSION: 1. Stable changes of pleural-parenchymal scarring noted over the left suprahilar region consistent prior radiation therapy. 2.  Mild bibasilar  atelectasis. Electronically Signed  By: [place]   On: 11/28/2019 07:25   CT HEAD CODE STROKE WO CONTRAST  Result Date: 11/27/2019 CLINICAL DATA:  Altered mental status, slurred speech. Last known normal 16:30 EXAM: CT HEAD WITHOUT CONTRAST TECHNIQUE: Contiguous axial images were obtained from the base of the skull through the vertex without intravenous contrast. COMPARISON:  Brain MRI 10/17/2019 FINDINGS: Brain: Again demonstrated is edema within the right greater than left cerebellar hemispheres. Known metastatic lesions within the right cerebellum and midline cerebellar vermis were better appreciated on brain MRI 10/17/2019. Superimposed acute parenchymal hemorrhage within the right cerebellum. The dominant component of the hemorrhage measures 2.6 x 1.9 x 2.2 cm (series 2, image 7) (series 4, image 45). Posterior fossa mass effect appears similar to prior MRI 10/17/2019 with partial effacement of the fourth ventricle. The lateral and third ventricles are similar in size as compared to prior MRI. There is a small hemorrhagic parenchymal contusion within the inferolateral right temporal lobe with a small amount of adjacent acute subarachnoid hemorrhage (series 4, image 32) (series 2, image 13). No supratentorial midline shift. Unchanged generalized parenchymal atrophy. Vascular: No hyperdense vessel. Skull: Although not definitively identified, a left temporal bone fracture is suspected given the presence of a left mastoid effusion and adjacent subcutaneous emphysema. Sinuses/Orbits: Visualized orbits demonstrate no acute abnormality. Mild mucosal thickening within the right maxillary sinus. Left mastoid effusion. Other: There is subcutaneous gas within the left masticator/parapharyngeal space. Left posterior scalp hematoma. These results were called by telephone at the time of interpretation on 11/27/2019 at 8:02 pm to provider Dr. Lorraine Lax, who verbally acknowledged these results. IMPRESSION: 1.  Prominent edema within the right greater than left cerebellar hemispheres at site of known metastases. Superimposed acute parenchymal hemorrhage within the right cerebellum measuring 2.6 x 1.9 x 2.2 cm. Posterior fossa mass effect is similar to prior MRI with partial effacement of the fourth ventricle. 2. Small hemorrhagic contusion within the inferolateral right temporal lobe with adjacent small volume acute subarachnoid hemorrhage. 3. Although not definitively identified, an acute fracture of the left temporal bone is suspected given the presence of a left mastoid effusion and adjacent soft tissue gas within the left masticator/parapharyngeal space. 4. Left posterior scalp hematoma. Electronically Signed   By: Kellie Simmering DO   On: 11/27/2019 20:19    (Echo, Carotid, EGD, Colonoscopy, ERCP)    Subjective:  Anxious to be discharged to rehab she denies any complaints today denies any nausea vomiting diarrhea abdominal pain or chest pain or palpitations. Discharge Exam: Vitals:   12/04/19 0911 12/04/19 1222  BP: 105/72 117/79  Pulse: 70 77  Resp: 18 18  Temp: 98.4 F (36.9 C) 97.7 F (36.5 C)  SpO2: 98% 99%   Vitals:   12/03/19 2359 12/04/19 0406 12/04/19 0911 12/04/19 1222  BP: 115/75 107/68 105/72 117/79  Pulse: 66 65 70 77  Resp: 16 16 18 18   Temp: 98.9 F (37.2 C) 98.1 F (36.7 C) 98.4 F (36.9 C) 97.7 F (36.5 C)  TempSrc:  Oral Oral Oral  SpO2: 96% 98% 98% 99%    General: Pt is alert, awake, not in acute distress Cardiovascular: RRR, S1/S2 +, no rubs, no gallops Respiratory: CTA bilaterally, no wheezing, no rhonchi Abdominal: Soft, NT, ND, bowel sounds + Extremities: no edema, no cyanosis    The results of significant diagnostics from this hospitalization (including imaging, microbiology, ancillary and laboratory) are listed below for reference.     Microbiology: Recent Results (from the past 240 hour(s))  Respiratory Panel by RT PCR (Flu A&B, Covid) -  Nasopharyngeal Swab     Status: None   Collection Time: 11/27/19  8:28 PM   Specimen: Nasopharyngeal Swab  Result Value Ref Range Status   SARS Coronavirus 2 by RT PCR NEGATIVE NEGATIVE Final    Comment: (NOTE) SARS-CoV-2 target nucleic acids are NOT DETECTED. The SARS-CoV-2 RNA is generally detectable in upper respiratoy specimens during the acute phase of infection. The lowest concentration of SARS-CoV-2 viral copies this assay can detect is 131 copies/mL. A negative result does not preclude SARS-Cov-2 infection and should not be used as the sole basis for treatment or other patient management decisions. A negative result may occur with  improper specimen collection/handling, submission of specimen other than nasopharyngeal swab, presence of viral mutation(s) within the areas targeted by this assay, and inadequate number of viral copies (<131 copies/mL). A negative result must be combined with clinical observations, patient history, and epidemiological information. The expected result is Negative. Fact Sheet for Patients:  PinkCheek.be Fact Sheet for Healthcare Providers:  GravelBags.it This test is not yet ap proved or cleared by the [place] FDA and  has been authorized for detection and/or diagnosis of SARS-CoV-2 by FDA under an Emergency Use Authorization (EUA). This EUA will remain  in effect (meaning this test can be used) for the duration of the COVID-19 declaration under Section 564(b)(1) of the Act, 21 U.S.C. section 360bbb-3(b)(1), unless the authorization is terminated or revoked sooner.    Influenza A by PCR NEGATIVE NEGATIVE Final   Influenza B by PCR NEGATIVE NEGATIVE Final    Comment: (NOTE) The Xpert Xpress SARS-CoV-2/FLU/RSV assay is intended as an aid in  the diagnosis of influenza from Nasopharyngeal swab specimens and  should not be used as a sole basis for treatment. Nasal washings and  aspirates  are unacceptable for Xpert Xpress SARS-CoV-2/FLU/RSV  testing. Fact Sheet for Patients: PinkCheek.be Fact Sheet for Healthcare Providers: GravelBags.it This test is not yet approved or cleared by the [place] FDA and  has been authorized for detection and/or diagnosis of SARS-CoV-2 by  FDA under an Emergency Use Authorization (EUA). This EUA will remain  in effect (meaning this test can be used) for the duration of the  Covid-19 declaration under Section 564(b)(1) of the Act, 21  U.S.C. section 360bbb-3(b)(1), unless the authorization is  terminated or revoked. Performed at [facility], [place] [address] [place],  [address]      Labs: BNP (last 3 results) No results for input(s): BNP in the last 8760 hours. Basic Metabolic Panel: Recent Labs  Lab 11/28/19 0435 11/29/19 0457 11/30/19 0244 12/02/19 0250 12/04/19 0420  NA 139 139 140 139 139  K 3.5 3.8 4.1 4.5 4.5  CL 102 105 107 103 106  CO2 24 22 23 23 22   GLUCOSE 155* 126* 121* 110* 113*  BUN 12 19 17  25* 25*  CREATININE 0.81 0.81 0.82 0.82 0.78  CALCIUM 9.5 9.3 9.2 9.4 9.3  MG  --  2.1 2.3  --  2.2   Liver Function Tests: Recent Labs  Lab 11/27/19 1939 11/28/19 0435  AST 19 18  ALT 14 13  ALKPHOS 76 79  BILITOT 0.7 0.7  PROT 6.7 6.6  ALBUMIN 4.1 3.9   No results for input(s): LIPASE, AMYLASE in the last 168 hours. No results for input(s): AMMONIA in the last 168 hours. CBC: Recent Labs  Lab 11/27/19 1939 11/27/19 1939 11/27/19 1945 11/29/19 0457 11/30/19 0244 12/02/19  0250 12/04/19 0420  WBC 17.3*  --   --  14.4* 11.9* 10.1 12.3*  NEUTROABS 15.7*  --   --   --   --   --   --   HGB 14.9   < > 14.6 13.6 13.5 15.0 14.8  HCT 44.1   < > 43.0 39.1 39.5 43.6 43.4  MCV 91.9  --   --  89.5 90.4 88.8 89.5  PLT 276  --   --  263 255 271 298   < > = values in this interval not displayed.   Cardiac Enzymes: No results for input(s):  CKTOTAL, CKMB, CKMBINDEX, TROPONINI in the last 168 hours. BNP: Invalid input(s): POCBNP CBG: Recent Labs  Lab 11/27/19 1941  GLUCAP 152*   D-Dimer No results for input(s): DDIMER in the last 72 hours. Hgb A1c No results for input(s): HGBA1C in the last 72 hours. Lipid Profile No results for input(s): CHOL, HDL, LDLCALC, TRIG, CHOLHDL, LDLDIRECT in the last 72 hours. Thyroid function studies No results for input(s): TSH, T4TOTAL, T3FREE, THYROIDAB in the last 72 hours.  Invalid input(s): FREET3 Anemia work up No results for input(s): VITAMINB12, FOLATE, FERRITIN, TIBC, IRON, RETICCTPCT in the last 72 hours. Urinalysis    Component Value Date/Time   COLORURINE YELLOW 11/27/2019 2139   APPEARANCEUR HAZY (A) 11/27/2019 2139   LABSPEC 1.014 11/27/2019 2139   PHURINE 7.0 11/27/2019 2139   GLUCOSEU NEGATIVE 11/27/2019 2139   HGBUR NEGATIVE 11/27/2019 2139   BILIRUBINUR NEGATIVE 11/27/2019 2139   KETONESUR 5 (A) 11/27/2019 2139   PROTEINUR NEGATIVE 11/27/2019 2139   NITRITE NEGATIVE 11/27/2019 2139   LEUKOCYTESUR LARGE (A) 11/27/2019 2139   Sepsis Labs Invalid input(s): PROCALCITONIN,  WBC,  LACTICIDVEN Microbiology Recent Results (from the past 240 hour(s))  Respiratory Panel by RT PCR (Flu A&B, Covid) - Nasopharyngeal Swab     Status: None   Collection Time: 11/27/19  8:28 PM   Specimen: Nasopharyngeal Swab  Result Value Ref Range Status   SARS Coronavirus 2 by RT PCR NEGATIVE NEGATIVE Final    Comment: (NOTE) SARS-CoV-2 target nucleic acids are NOT DETECTED. The SARS-CoV-2 RNA is generally detectable in upper respiratoy specimens during the acute phase of infection. The lowest concentration of SARS-CoV-2 viral copies this assay can detect is 131 copies/mL. A negative result does not preclude SARS-Cov-2 infection and should not be used as the sole basis for treatment or other patient management decisions. A negative result may occur with  improper specimen  collection/handling, submission of specimen other than nasopharyngeal swab, presence of viral mutation(s) within the areas targeted by this assay, and inadequate number of viral copies (<131 copies/mL). A negative result must be combined with clinical observations, patient history, and epidemiological information. The expected result is Negative. Fact Sheet for Patients:  PinkCheek.be Fact Sheet for Healthcare Providers:  GravelBags.it This test is not yet ap proved or cleared by the [place] FDA and  has been authorized for detection and/or diagnosis of SARS-CoV-2 by FDA under an Emergency Use Authorization (EUA). This EUA will remain  in effect (meaning this test can be used) for the duration of the COVID-19 declaration under Section 564(b)(1) of the Act, 21 U.S.C. section 360bbb-3(b)(1), unless the authorization is terminated or revoked sooner.    Influenza A by PCR NEGATIVE NEGATIVE Final   Influenza B by PCR NEGATIVE NEGATIVE Final    Comment: (NOTE) The Xpert Xpress SARS-CoV-2/FLU/RSV assay is intended as an aid in  the diagnosis of influenza from  Nasopharyngeal swab specimens and  should not be used as a sole basis for treatment. Nasal washings and  aspirates are unacceptable for Xpert Xpress SARS-CoV-2/FLU/RSV  testing. Fact Sheet for Patients: PinkCheek.be Fact Sheet for Healthcare Providers: GravelBags.it This test is not yet approved or cleared by the [place] FDA and  has been authorized for detection and/or diagnosis of SARS-CoV-2 by  FDA under an Emergency Use Authorization (EUA). This EUA will remain  in effect (meaning this test can be used) for the duration of the  Covid-19 declaration under Section 564(b)(1) of the Act, 21  U.S.C. section 360bbb-3(b)(1), unless the authorization is  terminated or revoked. Performed at [facility], [place] [address], [place], [place] [address]      Time coordinating discharge: 39 minutes  SIGNED:   Georgette Shell, MD  [facility] 12/04/2019, 1:06 PM Pager   If 7PM-7AM, please contact night-coverage [email or web address] Password [email or web address]

## 2019-12-04 NOTE — Consult Note (Signed)
Consultation Note Date: 12/04/2019   Patient Name: Hailey Hill  DOB: [birth year]  MRN: [id number]  Age / Sex: 69 y.o., female  PCP: [name], NP Referring Physician: Georgette Shell, MD  Reason for Consultation: Establishing goals of care and Psychosocial/spiritual support  HPI/Patient Profile:  Hailey Hill is a 69 year old female with history of SCLC with SVC syndrome, falls with humerus Fx 06/2019 with diagnosis of cerebellar mets treated with XRT, left basal and subclavian vein thrombosis, peripheral neuropathy, who was admitted on 11/27/2019 after being found confused, with slurred speech, blood tinged vomitus, and bowel incontinence. History taken from chart review and patient. CT showing small hemorrhage in right cerebellum without change in mass-effect and small hemorrhagic contusion right temporal lobe.  Neurology was consulted and felt that patient likely with seizure activity leading to LOC, fall and trauma to head.  She was placed on Keppra for seizure prophylaxis and Decadron added to help manage edema.  EEG was negative for epileptiform discharges and mild encephalopathy noted Dr. Saintclair Halsted was consulted for input and felt that patient with progressive brain mets due to additionally lesions likely due to failure of treatment and supportive care recommended.  Dr. Mickeal Skinner was consulted for input and felt that lesions best suited for radiotherapy once patient clinically improved, planning coordinate with Dr. Isidore Moos.  Dr. Lanell Persons to follow-up for input on treatment as well as need for repeat MRI.  He recommended continuing decadron with taper as mentation improved.  Hospital course further complicated by UTI and treated with Rocephin x3 days.  Therapy ongoing and patient continues to have poor safety awareness with impulsivity, decrease in problem-solving and unsteady gait affecting ADLs and mobility.   CIR recommended due to functional decline.    Currently in CIR for rehabilitation.   Patient and her family face treatment option decisions, advanced directive decisions and anticipatory care needs.    Clinical Assessment and Goals of Care:   This NP Wadie Lessen reviewed medical records, received report from team, assessed the patient and then meet at the patient's bedside and then spoke by telephone with both her husband and son to discuss diagnosis, prognosis, GOC, EOL wishes disposition and options.  Concept of Hospice and Palliative Care were discussed  A detailed discussion was had today regarding advanced directives.  Concepts specific to code status, artifical feeding and hydration, continued IV antibiotics and rehospitalization was had.  The difference between a aggressive medical intervention path  and a palliative comfort care path for this patient at this time was had.  Values and goals of care important to patient and family were attempted to be elicited.  Questions and concerns addressed.   Family encouraged to call with questions or concerns.    PMT will continue to support holistically.    No documented healthcare power of attorney or advanced directive at this time.  Encourage family to consider.  I informed them that spiritual care department would help with that documentation.     SUMMARY OF RECOMMENDATIONS  Code Status/Advance Care Planning:  Full code   Patient and her family are open to all offered and available medcial interventions to prolong life.  Palliative Prophylaxis:   Bowel Regimen, Delirium Protocol, Frequent Pain Assessment and Oral Care  Additional Recommendations (Limitations, Scope, Preferences):  Full Scope Treatment  Psycho-social/Spiritual:   Desire for further Chaplaincy support:no  Additional Recommendations: Created space and opportunity for patient and her family to explore the thoughts and feelings regarding current medical  situation.  Patient herself had no interest in conversation today.  Patient's husband and son were open and appreciative of palliaitve's support.  Plan is to meet at bedside for continued conversation next week.  Prognosis:   Unable to determine  Discharge Planning: To Be Determined      Primary Diagnoses: Present on Admission: . Metastatic lung cancer (metastasis from lung to other site) [facility]) . Metastatic cancer to brain [facility])   I have reviewed the medical record, interviewed the patient and family, and examined the patient. The following aspects are pertinent.  Past Medical History:  Diagnosis Date  . Complication of anesthesia    had complications with "gas buildup" after tubal lig  . Dyslipidemia   . History of radiation therapy 07/16/19, 07/19/19, 07/22/19   SRS radiation to 2 cerebellum targets.   . Lung cancer ([place]) dx'd 07/2010   sm cell   . Peripheral neuropathy   . SVC syndrome   . Thrombus 42/70/6237   Left basilic vein,  Left subclavian vein   Social History   Socioeconomic History  . Marital status: Married    Spouse name: Not on file  . Number of children: Not on file  . Years of education: Not on file  . Highest education level: Not on file  Occupational History  . Not on file  Tobacco Use  . Smoking status: Former Smoker    Quit date: 03/08/2010    Years since quitting: 9.7  . Smokeless tobacco: Never Used  Substance and Sexual Activity  . Alcohol use: No  . Drug use: No  . Sexual activity: Never    Birth control/protection: Surgical  Other Topics Concern  . Not on file  Social History Narrative  . Not on file   Social Determinants of Health   Financial Resource Strain:   . Difficulty of Paying Living Expenses: Not on file  Food Insecurity:   . Worried About Charity fundraiser in the Last Year: Not on file  . Ran Out of Food in the Last Year: Not on file  Transportation Needs: No Transportation Needs  . Lack of Transportation (Medical): No   . Lack of Transportation (Non-Medical): No  Physical Activity:   . Days of Exercise per Week: Not on file  . Minutes of Exercise per Session: Not on file  Stress:   . Feeling of Stress : Not on file  Social Connections:   . Frequency of Communication with Friends and Family: Not on file  . Frequency of Social Gatherings with Friends and Family: Not on file  . Attends Religious Services: Not on file  . Active Member of Clubs or Organizations: Not on file  . Attends Archivist Meetings: Not on file  . Marital Status: Not on file   Family History  Problem Relation Age of Onset  . Nephrolithiasis Son   . Heart attack Father   . Heart attack Paternal Uncle   . Heart attack Paternal Grandfather    Scheduled Meds: . docusate sodium  100 mg Oral Daily  . levETIRAcetam  500 mg Oral BID   Continuous Infusions: . sodium chloride 250 mL (11/29/19 2036)   PRN Meds:.sodium chloride, acetaminophen **OR** acetaminophen (TYLENOL) oral liquid 160 mg/5 mL **OR** acetaminophen, hydrALAZINE, ondansetron (ZOFRAN) IV Medications Prior to Admission:  Prior to Admission medications   Medication Sig Start Date End Date Taking? Authorizing Provider  cholecalciferol (VITAMIN D3) 25 MCG (1000 UNIT) tablet Take 1,000 Units by mouth daily.   Yes [provider]  vitamin B-12 (CYANOCOBALAMIN) 1000 MCG tablet Take 1,000 mcg by mouth daily.   Yes [provider]  dexamethasone (DECADRON) 4 MG tablet Take 1 tablet (4 mg total) by mouth daily. 12/04/19   Georgette Shell, MD  levETIRAcetam (KEPPRA) 500 MG tablet Take 1 tablet (500 mg total) by mouth 2 (two) times daily. 12/04/19   Georgette Shell, MD   Allergies  Allergen Reactions  . Promethazine Hcl Other (See Comments)    Pt reports feeling shaky and "sick" after phenergan   Review of Systems  Constitutional: Positive for fatigue.    Physical Exam Constitutional:      Appearance: She is normal weight.   Cardiovascular:     Rate and Rhythm: Normal rate.  Skin:    General: Skin is warm and dry.  Neurological:     Mental Status: She is alert.     Vital Signs: BP 117/79 (BP Location: Right Arm)   Pulse 77   Temp 97.7 F (36.5 C) (Oral)   Resp 18   SpO2 99%  Pain Scale: 0-10   Pain Score: 0-No pain   SpO2: SpO2: 99 % O2 Device:SpO2: 99 % O2 Flow Rate: .   IO: Intake/output summary:   Intake/Output Summary (Last 24 hours) at 12/04/2019 1415 Last data filed at 12/04/2019 0900 Gross per 24 hour  Intake 240 ml  Output 600 ml  Net -360 ml    LBM: Last BM Date: 12/02/19 Baseline Weight:   Most recent weight:       Palliative Assessment/Data:     Time In: 1000 Time Out: 1115 Time Total: 75 minutes Greater than 50%  of this time was spent counseling and coordinating care related to the above assessment and plan.  Signed by: Wadie Lessen, NP   Please contact Palliative Medicine Team phone at [phone] for questions and concerns.  For individual provider: See Shea Evans

## 2019-12-04 NOTE — H&P (Signed)
Physical Medicine and Rehabilitation Admission H&P    Chief Complaint  Patient presents with  . Metastatic brain cancer with functional deficits.     HPI: Hailey Hill is a 69 year old female with history of SCLC with SVC syndrome, falls with humerus Fx 06/2019 with diagnosis of cerebellar mets treated with XRT, left basal and subclavian vein thrombosis, peripheral neuropathy, who was admitted on 11/27/2019 after being found confused, with slurred speech, blood tinged vomitus, and bowel incontinence. History taken from chart review and patient. CT showing small hemorrhage in right cerebellum without change in mass-effect and small hemorrhagic contusion right temporal lobe.  Neurology was consulted and felt that patient likely with seizure activity leading to LOC, fall and trauma to head.  She was placed on Keppra for seizure prophylaxis and Decadron added to help manage edema.  EEG was negative for epileptiform discharges and mild encephalopathy noted Dr. Saintclair Halsted was consulted for input and felt that patient with progressive brain mets due to additionally lesions likely due to failure of treatment and supportive care recommended.  Dr. Mickeal Skinner was consulted for input and felt that lesions best suited for radiotherapy once patient clinically improved, planning coordinate with Dr. Isidore Moos.  Dr. Lanell Persons to follow-up for input on treatment as well as need for repeat MRI.  He recommended continuing decadron with taper as mentation improved.  Hospital course further complicated by UTI and treated with Rocephin x3 days.  Therapy ongoing and patient continues to have poor safety awareness with impulsivity, decrease in problem-solving and unsteady gait affecting ADLs and mobility.  CIR recommended due to functional decline. Please see preadmission assessment from earlier today.   Review of Systems  Constitutional: Negative for chills and fever.  Musculoskeletal: Positive for joint pain. Negative for myalgias.    Neurological: Positive for speech change. Negative for sensory change, focal weakness and weakness.  All other systems reviewed and are negative.  Past Medical History:  Diagnosis Date  . Complication of anesthesia    had complications with "gas buildup" after tubal lig  . Dyslipidemia   . History of radiation therapy 07/16/19, 07/19/19, 07/22/19   SRS radiation to 2 cerebellum targets.   . Lung cancer ([place]) dx'd 07/2010   sm cell   . Peripheral neuropathy   . SVC syndrome   . Thrombus 81/82/9937   Left basilic vein,  Left subclavian vein    Past Surgical History:  Procedure Laterality Date  . CERVICAL BIOPSY    . CHOLECYSTECTOMY N/A 07/25/2016   Procedure: LAPAROSCOPIC CHOLECYSTECTOMY;  Surgeon: Coralie Keens, MD;  Location: [facility];  Service: General;  Laterality: N/A;  . TONSILLECTOMY    . TUBAL LIGATION      Family History  Problem Relation Age of Onset  . Nephrolithiasis Son   . Heart attack Father   . Heart attack Paternal Uncle   . Heart attack Paternal Grandfather     Social History:  Married. Has been disabled due to  Use cane and furniture walks in home. Husband works nights. She reports that she quit smoking about 9 years ago. She has never used smokeless tobacco. She reports that she does not drink alcohol or use drugs.    Allergies  Allergen Reactions  . Promethazine Hcl Other (See Comments)    Pt reports feeling shaky and "sick" after phenergan    Medications Prior to Admission  Medication Sig Dispense Refill  . cholecalciferol (VITAMIN D3) 25 MCG (1000 UNIT) tablet Take 1,000 Units  by mouth daily.    . vitamin B-12 (CYANOCOBALAMIN) 1000 MCG tablet Take 1,000 mcg by mouth daily.      Drug Regimen Review  Drug regimen was reviewed and remains appropriate with no significant issues identified  Home: Home Living Family/patient expects to be discharged to:: Private residence Living Arrangements: Spouse/significant other Available  Help at Discharge: Family Type of Home: House Home Access: Stairs to enter Technical brewer of Steps: 4 Entrance Stairs-Rails: Right Home Layout: One level Bathroom Shower/Tub: Tub/shower unit Home Equipment: Grab bars - tub/shower   Functional History: Prior Function Level of Independence: Independent  Functional Status:  Mobility: Bed Mobility Overal bed mobility: Needs Assistance Bed Mobility: Supine to Sit, Sit to Supine Supine to sit: Min assist, HOB elevated Sit to supine: Min guard, HOB elevated General bed mobility comments: pt able to progress to EOB with heavy use of bed rails to scoot self to EOb with HOB elevated. pt returned self to supine with min guard for safety Transfers Overall transfer level: Needs assistance Equipment used: Rolling walker (2 wheeled) Transfers: Sit to/from Stand Sit to Stand: Min assist, Mod assist Stand pivot transfers: Mod assist General transfer comment: pt required min a for sit>stand from elevated EOB and MOD A from [facility] needing cues for hand placement and overall technique Ambulation/Gait Ambulation/Gait assistance: Mod assist, Min assist Gait Distance (Feet): 100 Feet Assistive device: Rolling walker (2 wheeled) Gait Pattern/deviations: Step-through pattern, Narrow base of support, Ataxic, Decreased stride length, Scissoring General Gait Details: Pt required assistance to maintain balance.  Pt required increased assistance to turn and for backing.  Noted with RLE scissoring to midline.R lateral lean with intermittent moderate assistance to maintain balance. Gait velocity: decreased    ADL: ADL Overall ADL's : Needs assistance/impaired Grooming: Min guard, Sitting Grooming Details (indicate cue type and reason): min guard for sitting balance due to instability and decreased balance reactions Lower Body Bathing: Supervison/ safety, Set up, Sitting/lateral leans Lower Body Dressing: Minimal assistance, Bed level Lower Body  Dressing Details (indicate cue type and reason): pt able to bridge hips and doff underwear; MIN A to manipulate underwear around purewick Toilet Transfer: Moderate assistance, Ambulation, RW Toilet Transfer Details (indicate cue type and reason): MOD A for safety and balance for simulated toilet transfer via functional mobility with RW. pt needed cues for safety in relation to hand placement and body mechanics Toileting- Clothing Manipulation and Hygiene: +2 for physical assistance, Moderate assistance Functional mobility during ADLs: Moderate assistance, Rolling walker General ADL Comments: session focus on seated LB bathing; LB dressing and functional mobility. pt limited by poor balance and trunk control and generalized weakness  Cognition: Cognition Overall Cognitive Status: Impaired/Different from baseline Arousal/Alertness: Awake/alert(drowsy) Orientation Level: Oriented X4 Attention: Sustained Sustained Attention: Appears intact Memory: Impaired Memory Impairment: Decreased short term memory Problem Solving: Appears intact Executive Function: Reasoning Reasoning: Appears intact Cognition Arousal/Alertness: Awake/alert Behavior During Therapy: WFL for tasks assessed/performed, Impulsive Overall Cognitive Status: Impaired/Different from baseline Area of Impairment: Orientation, Safety/judgement, Problem solving, Awareness, Following commands Orientation Level: Disoriented to, Time Following Commands: Follows one step commands with increased time Safety/Judgement: Decreased awareness of safety, Decreased awareness of deficits Awareness: Intellectual Problem Solving: Difficulty sequencing, Requires verbal cues, Slow processing General Comments: pt impulsive throughout session and distracted in busy environment. pt with poor safety awareness needing cues during mobility  Blood pressure 105/72, pulse 70, temperature 98.4 F (36.9 C), temperature source Oral, resp. rate 18, SpO2 98  %. Physical Exam  Vitals reviewed. Constitutional:  She appears well-developed and well-nourished.  HENT:  Head: Normocephalic and atraumatic.  Eyes: EOM are normal. Right eye exhibits no discharge. Left eye exhibits no discharge.  Neck: No tracheal deviation present. No thyromegaly present.  Respiratory: Effort normal. No stridor. No respiratory distress.  GI: Soft. She exhibits no distension.  Musculoskeletal:     Comments: No edema or tenderness in extremities  Neurological: She is alert.  Dysarthria Motor: Grossly 5/5 throughout Minimal ataxia right upper extremity  Skin: Skin is warm.  Psychiatric: She has a normal mood and affect. Her behavior is normal.    Results for orders placed or performed during the hospital encounter of 11/27/19 (from the past 48 hour(s))  Basic metabolic panel     Status: Abnormal   Collection Time: 12/04/19  4:20 AM  Result Value Ref Range   Sodium 139 135 - 145 mmol/L   Potassium 4.5 3.5 - 5.1 mmol/L   Chloride 106 98 - 111 mmol/L   CO2 22 22 - 32 mmol/L   Glucose, Bld 113 (H) 70 - 99 mg/dL   BUN 25 (H) 8 - 23 mg/dL   Creatinine, Ser 0.78 0.44 - 1.00 mg/dL   Calcium 9.3 8.9 - 10.3 mg/dL   GFR calc non Af Amer >60 >60 mL/min   GFR calc Af Amer >60 >60 mL/min   Anion gap 11 5 - 15    Comment: Performed at [facility], [place] [address], [place], [place] [address]  CBC     Status: Abnormal   Collection Time: 12/04/19  4:20 AM  Result Value Ref Range   WBC 12.3 (H) 4.0 - 10.5 K/uL   RBC 4.85 3.87 - 5.11 MIL/uL   Hemoglobin 14.8 12.0 - 15.0 g/dL   HCT 43.4 36.0 - 46.0 %   MCV 89.5 80.0 - 100.0 fL   MCH 30.5 26.0 - 34.0 pg   MCHC 34.1 30.0 - 36.0 g/dL   RDW 12.3 11.5 - 15.5 %   Platelets 298 150 - 400 K/uL   nRBC 0.0 0.0 - 0.2 %    Comment: Performed at [facility], [place] [address], [place], [place] [address]  Magnesium     Status: None   Collection Time: 12/04/19  4:20 AM  Result Value Ref Range   Magnesium 2.2 1.7 - 2.4 mg/dL     Comment: Performed at [facility] [address], [place], [place] [address]   No results found.     Medical Problem List and Plan: 1. Poor safety awareness with impulsivity, decrease in problem-solving, and unsteady gait affecting ADLs and mobility secondary to cerebellar mets.   -patient may shower  -ELOS/Goals:-13 days/supervision/min A  Admit to CIR 2.  Antithrombotics: -DVT/anticoagulation:  Will add SQ lovenox as 7 days out from bleed and high risk for DVTs  -antiplatelet therapy: N/A 3. Pain Management: tylenol prn.  4. Mood: LCSW to follow for evaluation and support.   -antipsychotic agents: N/a 5. Neuropsych: This patient is not fully capable of making decisions on her own behalf. 6. Skin/Wound Care: Routine pressure relief measures.  7. Fluids/Electrolytes/Nutrition:  Monitor I/O.  CMP ordered for tomorrow. 8. 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 Cont 5 mg daily until Neuro/Onc follow up. 9. Acute  AKI: Encourage fluid intake to help resolve dehydration.  CMP ordered for tomorrow. 10. Leucocytosis: Monitor for signs of infection. Will order UCS to monitor for efficacy of treatment.   CBC  ordered for tomorrow.  Bary Leriche, PA-C 12/04/2019  I have personally performed a face to face diagnostic evaluation, including, but not limited to relevant history and physical exam findings, of this patient and developed relevant assessment and plan.  Additionally, I have reviewed and concur with the physician assistant's documentation above.  Delice Lesch, MD, ABPMR  The patient's status has not changed. The original post admission physician evaluation remains appropriate, and any changes from the pre-admission screening or documentation from the acute chart are noted above.   Delice Lesch, MD, ABPMR

## 2019-12-04 NOTE — Progress Notes (Addendum)
Inpatient Rehab Admissions Coordinator:   I have insurance authorization and a bed available for pt to admit to CIR today. Awaiting final word from Dr. Rodena Piety to confirm, then I will let pt/family and CM know.    Addendum: Confirmed discharge to CIR today.   Shann Medal, PT, DPT Admissions Coordinator [phone] 12/04/19  10:01 AM

## 2019-12-05 ENCOUNTER — Inpatient Hospital Stay (HOSPITAL_COMMUNITY): Payer: Medicare HMO | Admitting: Speech Pathology

## 2019-12-05 ENCOUNTER — Inpatient Hospital Stay (HOSPITAL_COMMUNITY): Payer: Medicare HMO | Admitting: Physical Therapy

## 2019-12-05 ENCOUNTER — Inpatient Hospital Stay (HOSPITAL_COMMUNITY): Payer: Medicare HMO | Admitting: Occupational Therapy

## 2019-12-05 ENCOUNTER — Inpatient Hospital Stay (HOSPITAL_COMMUNITY): Payer: Medicare HMO

## 2019-12-05 DIAGNOSIS — C7931 Secondary malignant neoplasm of brain: Principal | ICD-10-CM

## 2019-12-05 DIAGNOSIS — C3490 Malignant neoplasm of unspecified part of unspecified bronchus or lung: Secondary | ICD-10-CM

## 2019-12-05 LAB — CBC WITH DIFFERENTIAL/PLATELET
Abs Immature Granulocytes: 0.47 10*3/uL — ABNORMAL HIGH (ref 0.00–0.07)
Basophils Absolute: 0.1 10*3/uL (ref 0.0–0.1)
Basophils Relative: 1 %
Eosinophils Absolute: 0.1 10*3/uL (ref 0.0–0.5)
Eosinophils Relative: 1 %
HCT: 42.1 % (ref 36.0–46.0)
Hemoglobin: 14.7 g/dL (ref 12.0–15.0)
Immature Granulocytes: 4 %
Lymphocytes Relative: 17 %
Lymphs Abs: 2 10*3/uL (ref 0.7–4.0)
MCH: 30.9 pg (ref 26.0–34.0)
MCHC: 34.9 g/dL (ref 30.0–36.0)
MCV: 88.6 fL (ref 80.0–100.0)
Monocytes Absolute: 0.7 10*3/uL (ref 0.1–1.0)
Monocytes Relative: 6 %
Neutro Abs: 8.2 10*3/uL — ABNORMAL HIGH (ref 1.7–7.7)
Neutrophils Relative %: 71 %
Platelets: 262 10*3/uL (ref 150–400)
RBC: 4.75 MIL/uL (ref 3.87–5.11)
RDW: 12.4 % (ref 11.5–15.5)
WBC: 11.4 10*3/uL — ABNORMAL HIGH (ref 4.0–10.5)
nRBC: 0 % (ref 0.0–0.2)

## 2019-12-05 LAB — URINALYSIS, ROUTINE W REFLEX MICROSCOPIC
Bilirubin Urine: NEGATIVE
Glucose, UA: NEGATIVE mg/dL
Hgb urine dipstick: NEGATIVE
Ketones, ur: NEGATIVE mg/dL
Leukocytes,Ua: NEGATIVE
Nitrite: NEGATIVE
Protein, ur: NEGATIVE mg/dL
Specific Gravity, Urine: 1.021 (ref 1.005–1.030)
pH: 6 (ref 5.0–8.0)

## 2019-12-05 LAB — COMPREHENSIVE METABOLIC PANEL
ALT: 16 U/L (ref 0–44)
AST: 12 U/L — ABNORMAL LOW (ref 15–41)
Albumin: 3 g/dL — ABNORMAL LOW (ref 3.5–5.0)
Alkaline Phosphatase: 62 U/L (ref 38–126)
Anion gap: 13 (ref 5–15)
BUN: 24 mg/dL — ABNORMAL HIGH (ref 8–23)
CO2: 26 mmol/L (ref 22–32)
Calcium: 9.3 mg/dL (ref 8.9–10.3)
Chloride: 101 mmol/L (ref 98–111)
Creatinine, Ser: 0.94 mg/dL (ref 0.44–1.00)
GFR calc Af Amer: >60 mL/min (ref >60)
GFR calc non Af Amer: >60 mL/min (ref >60)
Glucose, Bld: 95 mg/dL (ref 70–99)
Potassium: 4.1 mmol/L (ref 3.5–5.1)
Sodium: 140 mmol/L (ref 135–145)
Total Bilirubin: 0.8 mg/dL (ref 0.3–1.2)
Total Protein: 5.4 g/dL — ABNORMAL LOW (ref 6.5–8.1)

## 2019-12-05 LAB — URINE CULTURE

## 2019-12-05 NOTE — Evaluation (Signed)
Occupational Therapy Assessment and Plan  Patient Details  Name: Hailey Hill MRN: [id number] Date of Birth: 07-30-1951  OT Diagnosis: abnormal posture, cognitive deficits and muscle weakness (generalized) Rehab Potential: Rehab Potential (ACUTE ONLY): Good ELOS: 10-14 days   Today's Date: 12/05/2019 OT Individual Time: 1400-1500 OT Individual Time Calculation (min): 60 min     Problem List:  Patient Active Problem List   Diagnosis Date Noted  . 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 Closed fracture of temporal bone ([place])   . Leukocytosis   . AKI (acute kidney injury) ([place])   . Intracranial hemorrhage, nontraumatic ([place]) 11/27/2019  . Acute lower UTI 11/27/2019  . Fall 07/10/2019  . Closed fracture of head of left humerus   . Metastatic lung cancer (metastasis from lung to other site) ([place]) 07/07/2019  . Fracture, humerus, anatomical neck, left, closed, initial encounter 07/07/2019  . Metastatic cancer to brain ([place]) 07/07/2019  . Chest pain with moderate risk for cardiac etiology 12/14/2015  . Small cell lung cancer ([place]) 09/16/2013    Past Medical History:  Past Medical History:  Diagnosis Date  . Complication of anesthesia    had complications with "gas buildup" after tubal lig  . Dyslipidemia   . History of radiation therapy 07/16/19, 07/19/19, 07/22/19   SRS radiation to 2 cerebellum targets.   . Lung cancer ([place]) dx'd 07/2010   sm cell   . Peripheral neuropathy   . SVC syndrome   . Thrombus 72/07/4708   Left basilic vein,  Left subclavian vein   Past Surgical History:  Past Surgical History:  Procedure Laterality Date  . CERVICAL BIOPSY    . CHOLECYSTECTOMY N/A 07/25/2016   Procedure: LAPAROSCOPIC CHOLECYSTECTOMY;  Surgeon: Coralie Keens, MD;  Location: [place];  Service: General;  Laterality: N/A;  . TONSILLECTOMY    . TUBAL LIGATION      Assessment & Plan Clinical Impression: Patient is a 69 y.o. year old female history of  SCLC with SVC syndrome, falls with humerus Fx 06/2019 with diagnosis of cerebellar mets treated with XRT, left basal and subclavian vein thrombosis, peripheral neuropathy, who was admitted on 11/27/2019 after being found confused, with slurred speech, blood tinged vomitus, and bowel incontinence. History taken from chart review and patient. CT showing small hemorrhage in right cerebellum without change in mass-effect and small hemorrhagic contusion right temporal lobe.  Neurology was consulted and felt that patient likely with seizure activity leading to LOC, fall and trauma to head.  She was placed on Keppra for seizure prophylaxis and Decadron added to help manage edema.  EEG was negative for epileptiform discharges and mild encephalopathy noted Dr. Saintclair Halsted was consulted for input and felt that patient with progressive brain mets due to additionally lesions likely due to failure of treatment and supportive care recommended.  Dr. Mickeal Skinner was consulted for input and felt that lesions best suited for radiotherapy once patient clinically improved, planning coordinate with Dr. Isidore Moos.  Dr. Lanell Persons to follow-up for input on treatment as well as need for repeat MRI.  He recommended continuing decadron with taper as mentation improved.  Hospital course further complicated by UTI and treated with Rocephin x3 days.  Therapy ongoing and patient continues to have poor safety awareness with impulsivity, decrease in problem-solving and unsteady gait affecting ADLs and mobility.  CIR recommended due to functional decline. Patient transferred to CIR on 12/04/2019 .    Patient currently requires mod with basic self-care skills and basic mobility  secondary to muscle weakness, decreased cardiorespiratoy endurance, impaired timing and sequencing, unbalanced muscle activation, decreased coordination and decreased motor planning, decreased visual acuity, decreased attention, decreased awareness, decreased problem solving, decreased safety  awareness, decreased memory and delayed processing and decreased sitting balance, decreased standing balance, decreased postural control, hemiplegia and decreased balance strategies.  Prior to hospitalization, patient could complete ADL with independent .  Patient will benefit from skilled intervention to decrease level of assist with basic self-care skills and increase independence with basic self-care skills prior to discharge home with care partner.  Anticipate patient will require 24 hour supervision and follow up home health.  OT - End of Session Activity Tolerance: Tolerates 30+ min activity with multiple rests Endurance Deficit: Yes OT Assessment Rehab Potential (ACUTE ONLY): Good OT Barriers to Discharge: Incontinence OT Patient demonstrates impairments in the following area(s): Balance;Safety;Cognition;Endurance;Motor;Perception OT Basic ADL's Functional Problem(s): Grooming;Bathing;Dressing;Toileting OT Transfers Functional Problem(s): Toilet;Tub/Shower OT Additional Impairment(s): Fuctional Use of Upper Extremity OT Plan OT Intensity: Minimum of 1-2 x/day, 45 to 90 minutes OT Frequency: 5 out of 7 days OT Duration/Estimated Length of Stay: 10-14 days OT Treatment/Interventions: Balance/vestibular training;Discharge planning;Pain management;Self Care/advanced ADL retraining;Therapeutic Activities;UE/LE Coordination activities;Cognitive remediation/compensation;Disease mangement/prevention;Functional mobility training;Patient/family education;Skin care/wound managment;Therapeutic Exercise;Visual/perceptual remediation/compensation;Community reintegration;DME/adaptive equipment instruction;Neuromuscular re-education;Psychosocial support;UE/LE Strength taining/ROM OT Self Feeding Anticipated Outcome(s): n/a OT Basic Self-Care Anticipated Outcome(s): supervision OT Toileting Anticipated Outcome(s): supervision OT Bathroom Transfers Anticipated Outcome(s): supervision OT  Recommendation Recommendations for Other Services: Neuropsych consult Patient destination: Home Follow Up Recommendations: Home health OT Equipment Recommended: To be determined   Skilled Therapeutic Intervention OT eval initiated with Ot purpose, role and goals discussed.   Self care retraining at shower level. At beginning of session and discussing recent events- pt with decr recall of recent events and decr awareness of current situation and deficits. At times, pt very tangile. At EOB pt realizes her bed is wet and then realized her brief and bed were saturated with urine. Pt ambulated to the bathroom with min to mod A with RW and transferred to toilet. During hygiene pt with BM on hands requiring A to finish cleaning hands and backside. Able to encourage pt to shower due to incontinence. Pt reports she had accidents with bladder at home PTA.  Pt able to ambulate short distance over to the shower with mod A with difficult with body awareness and turning requiring max cues to organize self. Pt able to shower without overall min A sit to stand with use of grab bar. Pt only with clothes came into hospital with and were soiled. Pt donned tank and gown and brief with overall mod A. Pt able to don her own socks and and shoes with setup and extra time. Ambulated to the recliner from sink without RW with mod A, taking very short small steps increasing her fall risk. Left resting in the recliner.  OT Evaluation Precautions/Restrictions  Precautions Precautions: Fall Precaution Comments: posterior bias; R inattention Pain  no pain in session  Home Living/Prior [place] expects to be discharged to:: Private residence Living Arrangements: Spouse/significant other Available Help at Discharge: Family Type of Home: House Home Access: Stairs to enter Technical brewer of Steps: 4 Entrance Stairs-Rails: Right Home Layout: One level Bathroom Shower/Tub: Scientist, forensic: Standard  Lives With: Significant other IADL History Education: graduated high school Prior Function Level of Independence: Independent with basic ADLs, Independent with homemaking with ambulation, Independent with gait, Independent with transfers  Able to Take Stairs?: Yes  Driving: Yes Vocation: Retired ADL ADL Grooming: Setup Where Assessed-Grooming: Sitting at sink Upper Body Bathing: Supervision/safety Where Assessed-Upper Body Bathing: Shower Lower Body Bathing: Moderate assistance Where Assessed-Lower Body Bathing: Shower Upper Body Dressing: Minimal assistance Where Assessed-Upper Body Dressing: Sitting at sink Lower Body Dressing: Minimal assistance Where Assessed-Lower Body Dressing: Wheelchair Toileting: Maximal assistance Where Assessed-Toileting: Glass blower/designer: Moderate assistance Social research officer, government: Moderate assistance Social research officer, government Method: Print production planner with back Vision Baseline Vision/History: No visual deficits Patient Visual Report: Diplopia Vision Assessment?: Vision impaired- to be further tested in functional context;Yes Eye Alignment: Impaired (comment) Tracking/Visual Pursuits: Right eye does not track laterally;Decreased smoothness of horizontal tracking;Unable to hold eye position out of midline Saccades: Within functional limits Diplopia Assessment: Disappears with one eye closed Additional Comments: reports diplopia but not during this exam Perception  Perception: Impaired Inattention/Neglect: (mild right inattention) Praxis Praxis: Impaired Praxis Impairment Details: Perseveration Praxis-Other Comments: mild Cognition Overall Cognitive Status: Impaired/Different from baseline Arousal/Alertness: Awake/alert Orientation Level: Person;Place;Situation Person: Oriented Place: Oriented Situation: Oriented Year: 2021 Month: January Day of Week: Correct Memory:  Impaired Memory Impairment: Decreased recall of new information;Decreased short term memory Decreased Short Term Memory: Verbal basic;Functional basic Immediate Memory Recall: Sock;Blue;Bed Memory Recall Sock: Not able to recall Memory Recall Blue: Not able to recall Memory Recall Bed: Not able to recall Attention: Selective Sustained Attention: Appears intact Selective Attention: Impaired Selective Attention Impairment: Functional basic Awareness: Impaired Awareness Impairment: Intellectual impairment;Emergent impairment Problem Solving: Impaired Problem Solving Impairment: Functional basic Executive Function: Reasoning;Self Monitoring;Self Correcting Reasoning: Impaired Reasoning Impairment: Functional basic Self Monitoring: Impaired Self Monitoring Impairment: Functional basic Self Correcting: Impaired Self Correcting Impairment: Verbal basic;Functional basic Safety/Judgment: Impaired [employer] Scales of Cognitive Functioning: Automatic/appropriate Sensation Sensation Light Touch: Appears Intact Proprioception: Appears Intact Coordination Gross Motor Movements are Fluid and Coordinated: No Fine Motor Movements are Fluid and Coordinated: Yes Finger Nose Finger Test: [facility] Heel Shin Test: decreased on R Motor  Motor Motor: Other (comment);Abnormal postural alignment and control Motor - Skilled Clinical Observations: mild RLE weakness more prominent with functional mobility than during formal MMT Mobility  Transfers Sit to Stand: Moderate Assistance - Patient 50-74% Stand to Sit: Moderate Assistance - Patient 50-74%  Trunk/Postural Assessment  Cervical Assessment Cervical Assessment: Within Functional Limits Thoracic Assessment Thoracic Assessment: (rounded shoulders) Postural Control Trunk Control: decreased in standing; poor awareness with posterior and R bias Protective Responses: delayed and inadquate  Balance Static Sitting Balance Static Sitting - Level  of Assistance: 5: Stand by assistance Dynamic Sitting Balance Dynamic Sitting - Level of Assistance: 4: Min assist Sitting balance - Comments: close min guard for static sitting Static Standing Balance Static Standing - Level of Assistance: 4: Min assist Dynamic Standing Balance Dynamic Standing - Level of Assistance: 3: Mod assist Extremity/Trunk Assessment RUE Assessment General Strength Comments: 4/5- uses WFL LUE Assessment LUE Assessment: Within Functional Limits     Refer to Care Plan for Long Term Goals  Recommendations for other services: Neuropsych   Discharge Criteria: Patient will be discharged from OT if patient refuses treatment 3 consecutive times without medical reason, if treatment goals not met, if there is a change in medical status, if patient makes no progress towards goals or if patient is discharged from hospital.  The above assessment, treatment plan, treatment alternatives and goals were discussed and mutually agreed upon: by patient  Nicoletta Ba 12/05/2019, 7:00 PM

## 2019-12-05 NOTE — Evaluation (Signed)
Speech Language Pathology Assessment and Plan  Patient Details  Name: Hailey Hill MRN: [id number] Date of Birth: [birth year]  SLP Diagnosis: Cognitive Impairments  Rehab Potential: Good ELOS: 10-12 days    Today's Date: 12/05/2019 SLP Individual Time: 5852-7782 SLP Individual Time Calculation (min): 47 min   Problem List:  Patient Active Problem List   Diagnosis Date Noted  . 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 Closed fracture of temporal bone ([place])   . Leukocytosis   . AKI (acute kidney injury) ([place])   . Intracranial hemorrhage, nontraumatic ([place]) 11/27/2019  . Acute lower UTI 11/27/2019  . Fall 07/10/2019  . Closed fracture of head of left humerus   . Metastatic lung cancer (metastasis from lung to other site) ([place]) 07/07/2019  . Fracture, humerus, anatomical neck, left, closed, initial encounter 07/07/2019  . Metastatic cancer to brain ([place]) 07/07/2019  . Chest pain with moderate risk for cardiac etiology 12/14/2015  . Small cell lung cancer ([place]) 09/16/2013   Past Medical History:  Past Medical History:  Diagnosis Date  . Complication of anesthesia    had complications with "gas buildup" after tubal lig  . Dyslipidemia   . History of radiation therapy 07/16/19, 07/19/19, 07/22/19   SRS radiation to 2 cerebellum targets.   . Lung cancer ([place]) dx'd 07/2010   sm cell   . Peripheral neuropathy   . SVC syndrome   . Thrombus 42/35/3614   Left basilic vein,  Left subclavian vein   Past Surgical History:  Past Surgical History:  Procedure Laterality Date  . CERVICAL BIOPSY    . CHOLECYSTECTOMY N/A 07/25/2016   Procedure: LAPAROSCOPIC CHOLECYSTECTOMY;  Surgeon: Coralie Keens, MD;  Location: [place];  Service: General;  Laterality: N/A;  . TONSILLECTOMY    . TUBAL LIGATION      Assessment / Plan / Recommendation Clinical Impression  Hailey Hill is a 69 year old female with history of SCLC with SVC syndrome, falls with humerus Fx  06/2019 with diagnosis of cerebellar mets treated with XRT, left basal and subclavian vein thrombosis, peripheral neuropathy, who was admitted on 11/27/2019 after being found confused, with slurred speech, blood tinged vomitus, and bowel incontinence. History taken from chart review and patient. CT showing small hemorrhage in right cerebellum without change in mass-effect and small hemorrhagic contusion right temporal lobe.  Neurology was consulted and felt that patient likely with seizure activity leading to LOC, fall and trauma to head.  She was placed on Keppra for seizure prophylaxis and Decadron added to help manage edema.  EEG was negative for epileptiform discharges and mild encephalopathy noted Dr. Saintclair Halsted was consulted for input and felt that patient with progressive brain mets due to additionally lesions likely due to failure of treatment and supportive care recommended.  Dr. Mickeal Skinner was consulted for input and felt that lesions best suited for radiotherapy once patient clinically improved, planning to coordinate with Dr. Isidore Moos.  Dr. Lanell Persons to follow-up for input on treatment as well as need for repeat MRI.  He recommended continuing decadron with taper as mentation improved.  Hospital course further complicated by UTI and treated with Rocephin x3 days.  Therapy ongoing and patient continues to have poor safety awareness with impulsivity, decrease in problem-solving and unsteady gait affecting ADLs and mobility.  CIR recommended due to functional decline and was admited on 12/04/19.   Pt presents with characteristics of Cerebellar Cognitive Affective Syndrome.  Schmahmann and [name] [phone]) wrote that "Cerebellar cognitive affective  syndrome (CCAS) is characterized by deficits in executive function, linguistic processing, spatial cognition and affect regulation."  The Cerebellar Cognitive Affective/Schmahmann Syndrome Scale (CCAS-Scale) version 1A was administered. Pt failed 7 of the 10 tests and according to  the test protocol this is indicative of CCAS. Pt with deficits in selective attention, short term memory, semi-complex problem solving, reasoning, intellectual awareness and pt doesn't demonstrate any safety awareness.  While pt's oral motor abilities appear functional but her overal speech intelligibility at the conversation level is decreased d/t dysprosody.  Pragmatically, pt is emotionally labile with blunted affect. As pt was independent prior to admission, skilled ST is required to increase pt's functional independence and reduce caregiver burden.    Skilled Therapeutic Interventions          Education was provided on nature of impairments and POC established with pt to target these deficits and increase functional independence.    SLP Assessment  Patient will need skilled [facility] during CIR admission    Recommendations  Recommendations for Other Services: Neuropsych consult Patient destination: Home Follow up Recommendations: Home Health SLP;24 hour supervision/assistance Equipment Recommended: None recommended by SLP    SLP Frequency 3 to 5 out of 7 days   SLP Duration  SLP Intensity  SLP Treatment/Interventions 10-12 days  Minumum of 1-2 x/day, 30 to 90 minutes  Cognitive remediation/compensation;Functional tasks;Patient/family education    Pain Pain Assessment Pain Scale: 0-10 Pain Score: 0-No pain Faces Pain Scale: No hurt  Prior Functioning Cognitive/Linguistic Baseline: Information not available Type of Home: House  Lives With: Significant other Available Help at Discharge: Family Education: graduated high school Vocation: Retired  Programmer, systems Overall Cognitive Status: Impaired/Different from baseline Arousal/Alertness: Awake/alert Orientation Level: Oriented X4 Attention: Selective Sustained Attention: Appears intact Selective Attention: Impaired Selective Attention Impairment: Functional basic Memory:  Impaired Memory Impairment: Decreased recall of new information;Decreased short term memory Decreased Short Term Memory: Verbal basic;Functional basic Immediate Memory Recall: Sock;Blue;Bed Memory Recall Sock: Not able to recall Memory Recall Blue: Not able to recall Memory Recall Bed: Not able to recall Awareness: Impaired Awareness Impairment: Intellectual impairment;Emergent impairment Problem Solving: Impaired Problem Solving Impairment: Functional basic Executive Function: Reasoning;Self Monitoring;Self Correcting Reasoning: Impaired Reasoning Impairment: Functional basic Self Monitoring: Impaired Self Monitoring Impairment: Functional basic Self Correcting: Impaired Self Correcting Impairment: Verbal basic;Functional basic Safety/Judgment: Impaired [employer] Scales of Cognitive Functioning: Automatic/appropriate  Comprehension Auditory Comprehension Overall Auditory Comprehension: Appears within functional limits for tasks assessed Visual Recognition/Discrimination Discrimination: Within Function Limits Reading Comprehension Reading Status: Within funtional limits Expression Expression Primary Mode of Expression: Verbal Verbal Expression Overall Verbal Expression: Appears within functional limits for tasks assessed Initiation: No impairment Automatic Speech: Name;Social Response Naming: No impairment Pragmatics: Impairment Impairments: Abnormal affect;Dysprosody;Eye contact;Topic maintenance Interfering Components: Attention Non-Verbal Means of Communication: Not applicable Written Expression Dominant Hand: Right Written Expression: Not tested Oral Motor Oral Motor/Sensory Function Overall Oral Motor/Sensory Function: Generalized oral weakness Motor Speech Overall Motor Speech: Impaired Respiration: Within functional limits Phonation: Normal Resonance: Within functional limits Articulation: Within functional limitis Intelligibility: Intelligibility  reduced Word: 75-100% accurate Phrase: 75-100% accurate Sentence: 75-100% accurate Conversation: 75-100% accurate Motor Planning: Witnin functional limits Motor Speech Errors: Not applicable   Short Term Goals: Week 1: SLP Short Term Goal 1 (Week 1): Pt will demonstrate selective attention for 5 minutes with Min A cues. SLP Short Term Goal 2 (Week 1): Pt will complete complex problem solving tasks with Min A cues. SLP Short Term Goal 3 (Week  1): Pt will demonstrate intellectual awareness by identifying 2 physical and 2 cognitive deficits with Min A cues. SLP Short Term Goal 4 (Week 1): Given Min A cues, pt will utilize compensatory memory strategies to recall daily information in 7 out of 10 opportunities.  Refer to Care Plan for Long Term Goals  Recommendations for other services: Neuropsych  Discharge Criteria: Patient will be discharged from SLP if patient refuses treatment 3 consecutive times without medical reason, if treatment goals not met, if there is a change in medical status, if patient makes no progress towards goals or if patient is discharged from hospital.  The above assessment, treatment plan, treatment alternatives and goals were discussed and mutually agreed upon: by patient  Kennadie Brenner 12/05/2019, 3:15 PM

## 2019-12-05 NOTE — Progress Notes (Signed)
Social Work Assessment and Plan   Patient Details  Name: Hailey Hill MRN: [id number] Date of Birth: [birth year]  Today's Date: 12/05/2019  Problem List:  Patient Active Problem List   Diagnosis Date Noted  . Lung cancer metastatic to brain ([place]) 12/04/2019  . Closed fracture of temporal bone ([place])   . Leukocytosis   . AKI (acute kidney injury) ([place])   . Intracranial hemorrhage, nontraumatic ([place]) 11/27/2019  . Acute lower UTI 11/27/2019  . Fall 07/10/2019  . Closed fracture of head of left humerus   . Metastatic lung cancer (metastasis from lung to other site) ([place]) 07/07/2019  . Fracture, humerus, anatomical neck, left, closed, initial encounter 07/07/2019  . Metastatic cancer to brain ([place]) 07/07/2019  . Chest pain with moderate risk for cardiac etiology 12/14/2015  . Small cell lung cancer ([place]) 09/16/2013   Past Medical History:  Past Medical History:  Diagnosis Date  . Complication of anesthesia    had complications with "gas buildup" after tubal lig  . Dyslipidemia   . History of radiation therapy 07/16/19, 07/19/19, 07/22/19   SRS radiation to 2 cerebellum targets.   . Lung cancer ([place]) dx'd 07/2010   sm cell   . Peripheral neuropathy   . SVC syndrome   . Thrombus 21/30/8657   Left basilic vein,  Left subclavian vein   Past Surgical History:  Past Surgical History:  Procedure Laterality Date  . CERVICAL BIOPSY    . CHOLECYSTECTOMY N/A 07/25/2016   Procedure: LAPAROSCOPIC CHOLECYSTECTOMY;  Surgeon: Coralie Keens, MD;  Location: [place];  Service: General;  Laterality: N/A;  . TONSILLECTOMY    . TUBAL LIGATION     Social History:  reports that she quit smoking about 9 years ago. She has never used smokeless tobacco. She reports that she does not drink alcohol or use drugs.  Family / Support Systems Marital Status: Married How Long?: 4 years Patient Roles: Spouse Spouse/Significant Other: [name] (husband): [phone] Children: [name] (son):  [phone]. Lives in [place], [place]. Other Supports: None reported Anticipated Caregiver: Primary caregiver will be pt husband [name]. Ability/Limitations of Caregiver: Pt husband intends to provide 24/7care initially at time of discharge. 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 Education: High school graduate Read: Yes Write: Yes Employment Status: Retired Date Retired/Disabled/Unemployed: 2011 Age Retired: 58 Public relations account executive Issues: Denies Guardian/Conservator: N/A   Abuse/Neglect Abuse/Neglect Assessment Can Be Completed: Yes Physical Abuse: Denies Verbal Abuse: Denies Sexual Abuse: Denies Exploitation of patient/patient's resources: Denies Self-Neglect: Denies  Emotional Status Pt's affect, behavior and adjustment status: Pt appears to be adjusting to current medical condition and diagnoses Recent Psychosocial Issues: Pt denies Mh hx/hospializations. Psychiatric History: N/A Substance Abuse History: Denies  Patient / Family Perceptions, Expectations & Goals Pt/Family understanding of illness & functional limitations: Pt and family understand pt current medical condition and care pt will require Premorbid pt/family roles/activities: Pt was staying home alone during the day with intermittent visits from husband Anticipated changes in roles/activities/participation: Pt will require 24/7 care initially at discharge Pt/family expectations/goals: Pt goal is to enjoy her 4 grandchildren.  Community [employer] Agencies: None Premorbid Home Care/DME Agencies: Other (Comment)(DME: w/c and rollator) Resource referrals recommended: Neuropsychology  Discharge Planning Living Arrangements: Spouse/significant other Support Systems:  Spouse/significant other Type of Residence: Private residence [employer]  Resources: Multimedia programmer (specify)([employer] Medicare) Financial Resources: SSI, Family Support Financial Screen Referred: No Living Expenses: Own Money Management: Patient Does the patient have any problems obtaining your medications?: No Patient/Family Preliminary Plans: Provide 24/7 care to meet pt care needs. Sw Barriers to Discharge: Other (comments) Sw Barriers to Discharge Comments: Metastatic cancer DC Planning Additional Notes/Comments: Plan is for pt to discharge to home with support from her husband.  Clinical Impression  SW met with pt in room to introduce self, explain role, and discuss discharge process. Pt confirms FACESHEET as accurate. Pt d/c plan is to return home with her husband. Pt denies jail/prison hx. Pt denies tobacco product use, EtoH, recreational drug use, and Mh hx/hospitalizations. Pt indicates shew as unsure if her husband will provide 24/7 care at discharge (indicated in pre admit notes). Sw to discuss with pt husband further.  Sw spoke with pt husband [name] [phone] and son [name]. to discuss discharge plan noted. Pt husband confirms, indicating that he will take off as much time as needed. SW informed on further follow-up once a discharge date is established.   [name], MSW Office: [phone] Cell: [phone] 12/05/2019, 12:22 PM

## 2019-12-05 NOTE — Progress Notes (Deleted)
Social Work Assessment and Plan   Patient Details  Name: Hailey Hill MRN: [id number] Date of Birth: January 02, 1951  Today's Date: 12/05/2019  Problem List:  Patient Active Problem List   Diagnosis Date Noted  . Lung cancer metastatic to brain ([place]) 12/04/2019  . Closed fracture of temporal bone ([place])   . Leukocytosis   . AKI (acute kidney injury) ([place])   . Intracranial hemorrhage, nontraumatic ([place]) 11/27/2019  . Acute lower UTI 11/27/2019  . Fall 07/10/2019  . Closed fracture of head of left humerus   . Metastatic lung cancer (metastasis from lung to other site) ([place]) 07/07/2019  . Fracture, humerus, anatomical neck, left, closed, initial encounter 07/07/2019  . Metastatic cancer to brain ([place]) 07/07/2019  . Chest pain with moderate risk for cardiac etiology 12/14/2015  . Small cell lung cancer ([place]) 09/16/2013   Past Medical History:  Past Medical History:  Diagnosis Date  . Complication of anesthesia    had complications with "gas buildup" after tubal lig  . Dyslipidemia   . History of radiation therapy 07/16/19, 07/19/19, 07/22/19   SRS radiation to 2 cerebellum targets.   . Lung cancer ([place]) dx'd 07/2010   sm cell   . Peripheral neuropathy   . SVC syndrome   . Thrombus 09/98/3382   Left basilic vein,  Left subclavian vein   Past Surgical History:  Past Surgical History:  Procedure Laterality Date  . CERVICAL BIOPSY    . CHOLECYSTECTOMY N/A 07/25/2016   Procedure: LAPAROSCOPIC CHOLECYSTECTOMY;  Surgeon: Coralie Keens, MD;  Location: [facility];  Service: General;  Laterality: N/A;  . TONSILLECTOMY    . TUBAL LIGATION     Social History:  reports that she quit smoking about 9 years ago. She has never used smokeless tobacco. She reports that she does not drink alcohol or use drugs.  Family / Support Systems Marital Status: Married How Long?: 4 years Patient Roles: Spouse Spouse/Significant Other: [name] (husband): [phone] Children: [name] (son):  [phone]. Lives in [place], [place]. Other Supports: None reported Anticipated Caregiver: Primary caregiver will be pt husband [name]. Ability/Limitations of Caregiver: Pt husband intends to provide 24/7care initially at time of discharge. 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 Education: High school graduate Read: Yes Write: Yes Employment Status: Retired Date Retired/Disabled/Unemployed: 2011 Age Retired: 58 Public relations account executive Issues: Denies Guardian/Conservator: N/A   Abuse/Neglect Abuse/Neglect Assessment Can Be Completed: Yes Physical Abuse: Denies Verbal Abuse: Denies Sexual Abuse: Denies Exploitation of patient/patient's resources: Denies Self-Neglect: Denies  Emotional Status Pt's affect, behavior and adjustment status: Pt appears to be adjusting to current medical condition and diagnoses Recent Psychosocial Issues: Pt denies MH hx. Per chart review states depression and anxiety. Psychiatric History: N/A Substance Abuse History: Denies  Patient / Family Perceptions, Expectations & Goals Pt/Family understanding of illness & functional limitations: Pt and family understand pt current medical condition and care pt will require Premorbid pt/family roles/activities: Pt was staying home alone during the day with intermittent visits from husband Anticipated changes in roles/activities/participation: Pt will require 24/7 care initially at discharge Pt/family expectations/goals: Pt goal is to enjoy her 4 grandchildren.  Community [employer] Agencies: None Premorbid Home Care/DME Agencies: Other (Comment)(DME: w/c and rollator) Resource referrals recommended: Neuropsychology  Discharge Planning Living Arrangements: Spouse/significant  other Support Systems: Spouse/significant  other Type of Residence: Private residence [employer] Resources: Multimedia programmer (specify)([employer] Medicare) Financial Resources: SSI, Family Support Financial Screen Referred: No Living Expenses: Own Money Management: Patient Does the patient have any problems obtaining your medications?: No Patient/Family Preliminary Plans: Provide 24/7 care to meet pt care needs. Sw Barriers to Discharge: Other (comments) Sw Barriers to Discharge Comments: Metastatic cancer DC Planning Additional Notes/Comments: Plan is for pt to discharge to home with support from her husband.  Clinical Impression SW met with pt in room to introduce self, explain role, and discuss discharge process. Pt confirms FACESHEET as accurate. Pt d/c plan is to return home with her husband. Pt denies jai/prison hx. Pt denies tobacco product use, EtOH, recreational drug use,and  Mh hx/hospitalizations. Pt indicated she was unsure if her husband will provide 24/7 care at discharge (indiciated in pre-admit notes). SW to discuss with pt husband further.   SW spoke with pt husband [name] [phone]) and son [name] to discuss discharge plan noted. Pt husband confirms, indicated that he will take off as much time as needed. SW informed on further follow-up once a discharge date is established.    [name], MSW Office: [phone] Cell: [phone] 12/05/2019, 12:17 PM

## 2019-12-05 NOTE — Evaluation (Signed)
Physical Therapy Assessment and Plan  Patient Details  Name: Hailey Hill MRN: [id number] Date of Birth: 1951/07/28  PT Diagnosis: Abnormality of gait, Difficulty walking, Impaired cognition, Muscle weakness and Pain in joint Rehab Potential: Good ELOS: 10-12 days   Today's Date: 12/05/2019 PT Individual Time: 0830-0930 PT Individual Time Calculation (min): 60 min    Problem List:  Patient Active Problem List   Diagnosis Date Noted  . Lung cancer metastatic to brain ([place]) 12/04/2019  . Closed fracture of temporal bone ([place])   . Leukocytosis   . AKI (acute kidney injury) ([place])   . Intracranial hemorrhage, nontraumatic ([place]) 11/27/2019  . Acute lower UTI 11/27/2019  . Fall 07/10/2019  . Closed fracture of head of left humerus   . Metastatic lung cancer (metastasis from lung to other site) ([place]) 07/07/2019  . Fracture, humerus, anatomical neck, left, closed, initial encounter 07/07/2019  . Metastatic cancer to brain ([place]) 07/07/2019  . Chest pain with moderate risk for cardiac etiology 12/14/2015  . Small cell lung cancer ([place]) 09/16/2013    Past Medical History:  Past Medical History:  Diagnosis Date  . Complication of anesthesia    had complications with "gas buildup" after tubal lig  . Dyslipidemia   . History of radiation therapy 07/16/19, 07/19/19, 07/22/19   SRS radiation to 2 cerebellum targets.   . Lung cancer ([place]) dx'd 07/2010   sm cell   . Peripheral neuropathy   . SVC syndrome   . Thrombus 08/31/8526   Left basilic vein,  Left subclavian vein   Past Surgical History:  Past Surgical History:  Procedure Laterality Date  . CERVICAL BIOPSY    . CHOLECYSTECTOMY N/A 07/25/2016   Procedure: LAPAROSCOPIC CHOLECYSTECTOMY;  Surgeon: Coralie Keens, MD;  Location: [facility];  Service: General;  Laterality: N/A;  . TONSILLECTOMY    . TUBAL LIGATION      Assessment & Plan Clinical Impression: Patient is a 69 year old female with history of SCLC with  SVC syndrome, falls with humerus Fx 06/2019 with diagnosis of cerebellar mets treated with XRT, left basal and subclavian vein thrombosis, peripheral neuropathy, who was admitted on 11/27/2019 after being found confused, with slurred speech, blood tinged vomitus, and bowel incontinence. History taken from chart review and patient. CT showing small hemorrhage in right cerebellum without change in mass-effect and small hemorrhagic contusion right temporal lobe.  Neurology was consulted and felt that patient likely with seizure activity leading to LOC, fall and trauma to head.  She was placed on Keppra for seizure prophylaxis and Decadron added to help manage edema.  EEG was negative for epileptiform discharges and mild encephalopathy noted Dr. Saintclair Halsted was consulted for input and felt that patient with progressive brain mets due to additionally lesions likely due to failure of treatment and supportive care recommended.  Dr. Mickeal Skinner was consulted for input and felt that lesions best suited for radiotherapy once patient clinically improved, planning coordinate with Dr. Isidore Moos.  Dr. Lanell Persons to follow-up for input on treatment as well as need for repeat MRI.  He recommended continuing decadron with taper as mentation improved.  Hospital course further complicated by UTI and treated with Rocephin x3 days.  Therapy ongoing and patient continues to have poor safety awareness with impulsivity, decrease in problem-solving and unsteady gait affecting ADLs and mobility.  CIR recommended due to functional decline  Patient transferred to CIR on 12/04/2019 .   Patient currently requires mod with mobility secondary to muscle weakness, decreased cardiorespiratoy  endurance, decreased attention to right, decreased attention, decreased awareness, decreased problem solving, decreased safety awareness and decreased memory and decreased sitting balance, decreased standing balance, decreased postural control and decreased balance strategies.   Prior to hospitalization, patient was independent  with mobility and lived with Significant other in a House home.  Home access is 4Stairs to enter.  Patient will benefit from skilled PT intervention to maximize safe functional mobility, minimize fall risk and decrease caregiver burden for planned discharge home with intermittent assist.  Anticipate patient will benefit from follow up [facility] at discharge.  PT - End of Session Activity Tolerance: Decreased this session PT Assessment Rehab Potential (ACUTE/IP ONLY): Good PT Barriers to Discharge: Decreased caregiver support;Behavior;Pending chemo/radiation PT Barriers to Discharge Comments: unsure if husband taking time off of work; recommending 24/7 supervision PT Patient demonstrates impairments in the following area(s): Balance;Behavior;Endurance;Motor;Perception;Safety;Pain PT Transfers Functional Problem(s): Bed Mobility;Bed to Chair;Car;Furniture PT Locomotion Functional Problem(s): Ambulation;Stairs PT Plan PT Intensity: Minimum of 1-2 x/day ,45 to 90 minutes PT Frequency: 5 out of 7 days PT Duration Estimated Length of Stay: 10-12 days PT Treatment/Interventions: Ambulation/gait training;Balance/vestibular training;Cognitive remediation/compensation;Community reintegration;Discharge planning;Disease management/prevention;DME/adaptive equipment instruction;Functional mobility training;Neuromuscular re-education;Pain management;Patient/family education;Psychosocial support;Splinting/orthotics;Stair training;Therapeutic Activities;Therapeutic Exercise;UE/LE Strength taining/ROM;UE/LE Coordination activities;Wheelchair propulsion/positioning;Visual/perceptual remediation/compensation PT Transfers Anticipated Outcome(s): supervision to CGA overall PT Locomotion Anticipated Outcome(s): supervision to CGA overall ambulatory PT Recommendation Recommendations for Other Services: Therapeutic Recreation consult Therapeutic Recreation Interventions:  Outing/community reintergration;Pet therapy Follow Up Recommendations: Home health PT;24 hour supervision/assistance Patient destination: Home Equipment Recommended: Rolling walker with 5" wheels Equipment Details: has w/c (hers) and access to rollator (family member)  Skilled Therapeutic Intervention Evaluation completed (see details above and below) with education on PT POC and goals and individual treatment initiated with focus on transfer training with and without AD, initiation of gait training with RW for improved postural control and balance, stair negotiation, and overall activity tolerance/endurance. Pt performs sit <> stands with min to mod assist throughout session with cues for hand placement and facilitation for anterior weightshift due to tendency for posterior bias. Pt with notable poor safety awareness and postural control deficits. Pt does better with RW and UE support, demonstrates difficulty coordinating movement without AD. Pt request to use bathroom, functional gait into the bathroom with RW with overall min assist and performed toilet transfers with min to mod assist (low toilet) with cues for hand placement. Simulated car transfer with overall mod assist without AD with cues for technique, hand placement and facilitation for anterior weightshift. Gait training with RW x 120' with min assist overall with cues for increased R foot clearance, increasing step length bilaterally, and postural control awareness. End of session transferred to recliner with poor controlled descent to the chair.    PT Evaluation Precautions/Restrictions Precautions Precautions: Fall Precaution Comments: posterior bias; R inattention Restrictions Weight Bearing Restrictions: No Pain  reports generalized fatigue, no current pain. Home Living/Prior Functioning Home Living Available Help at Discharge: Family Type of Home: House Home Access: Stairs to enter Technical brewer of Steps:  4 Entrance Stairs-Rails: Right Home Layout: One level  Lives With: Significant other Prior Function Level of Independence: Independent with basic ADLs;Independent with homemaking with ambulation;Independent with gait;Independent with transfers  Able to Take Stairs?: Yes Driving: Yes Vocation: Retired Radiographer, therapeutic - History Baseline Vision: No visual deficits(pt reports inconsistent double vision since fall) Perception Perception: Impaired Inattention/Neglect: (mild R inattention)  Cognition Overall Cognitive Status: Impaired/Different from baseline Arousal/Alertness: Awake/alert Memory: Impaired Awareness: Impaired Problem Solving: Impaired Safety/Judgment:  Impaired Rancho BuildDNA.es Scales of Cognitive Functioning: Automatic/appropriate Sensation Sensation Light Touch: Appears Intact Proprioception: Appears Intact Coordination Gross Motor Movements are Fluid and Coordinated: No Heel Shin Test: decreased on R Motor  Motor Motor: Other (comment);Abnormal postural alignment and control(mild R sided weakness) Motor - Skilled Clinical Observations: mild RLE weakness more prominent with functional mobility than during formal MMT  Mobility Bed Mobility Bed Mobility: Rolling Right;Supine to Sit Rolling Right: Contact Guard/Touching assist Supine to Sit: Contact Guard/Touching assist Transfers Transfers: Sit to Stand;Stand Pivot Transfers;Stand to Sit Sit to Stand: Moderate Assistance - Patient 50-74% Stand to Sit: Moderate Assistance - Patient 50-74% Stand Pivot Transfers: Moderate Assistance - Patient 50 - 74% Locomotion  Gait Gait Distance (Feet): 10 Feet Gait Gait Pattern: Impaired Stairs / Additional Locomotion Stairs: Yes Stairs Assistance: Moderate Assistance - Patient 50 - 74% Stair Management Technique: One rail Right;Step to pattern Number of Stairs: 4 Height of Stairs: 6 Wheelchair Mobility Wheelchair Mobility: No  Trunk/Postural Assessment   Cervical Assessment Cervical Assessment: Within Functional Limits Thoracic Assessment Thoracic Assessment: (flexed posture) Lumbar Assessment Lumbar Assessment: (posterior pelvic tilt) Postural Control Postural Control: Deficits on evaluation Trunk Control: decreased in standing; poor awareness with posterior and R bias Protective Responses: delayed and inadquate  Balance Balance Balance Assessed: Yes Static Sitting Balance Static Sitting - Level of Assistance: 5: Stand by assistance Dynamic Sitting Balance Dynamic Sitting - Level of Assistance: 4: Min assist Static Standing Balance Static Standing - Level of Assistance: 4: Min assist Dynamic Standing Balance Dynamic Standing - Level of Assistance: 3: Mod assist Extremity Assessment      RLE Assessment RLE Assessment: Exceptions to [facility] General Strength Comments: grossly 4-/5 LLE Assessment LLE Assessment: Within Functional Limits    Refer to Care Plan for Long Term Goals  Recommendations for other services: Therapeutic Recreation  Pet therapy and Outing/community reintegration  Discharge Criteria: Patient will be discharged from PT if patient refuses treatment 3 consecutive times without medical reason, if treatment goals not met, if there is a change in medical status, if patient makes no progress towards goals or if patient is discharged from hospital.  The above assessment, treatment plan, treatment alternatives and goals were discussed and mutually agreed upon: by patient  Juanna Cao, PT, DPT, CBIS  12/05/2019, 10:01 AM

## 2019-12-05 NOTE — Progress Notes (Signed)
Inpatient Rehabilitation  Patient information reviewed and entered into eRehab system by Chonte Ricke M. Emiko Osorto, M.A., CCC/SLP, PPS Coordinator.  Information including medical coding, functional ability and quality indicators will be reviewed and updated through discharge.    

## 2019-12-05 NOTE — Progress Notes (Signed)
[facility] PHYSICAL MEDICINE & REHABILITATION PROGRESS NOTE   Subjective/Complaints: Pt reports feels like crap- didn't sleep. Made it seem like got blood draws 4+ times overnight, however I think she was perseverating on topic, because they usually only come once.     ROS: denies SOB, CP, abd pain, HA, vision changes, N/V/D/C.  Objective:   No results found. Recent Labs    12/04/19 0420 12/05/19 0512  WBC 12.3* 11.4*  HGB 14.8 14.7  HCT 43.4 42.1  PLT 298 262   Recent Labs    12/04/19 0420 12/05/19 0512  NA 139 140  K 4.5 4.1  CL 106 101  CO2 22 26  GLUCOSE 113* 95  BUN 25* 24*  CREATININE 0.78 0.94  CALCIUM 9.3 9.3    Intake/Output Summary (Last 24 hours) at 12/05/2019 0951 Last data filed at 12/05/2019 0900 Gross per 24 hour  Intake 340 ml  Output 200 ml  Net 140 ml     Physical Exam: Vital Signs Blood pressure 108/66, pulse 62, temperature 98.3 F (36.8 C), temperature source Oral, resp. rate 16, height 5\' 8"  (1.727 m), weight 73.9 kg, SpO2 96 %.  Physical Exam  Vitals reviewed. Constitutional: She appears well-developed and well-nourished.  HENT:  Head: Normocephalic and atraumatic.  Eyes: EOM are normal. Right eye exhibits no discharge. Left eye exhibits no discharge.  Neck: No tracheal deviation present. No thyromegaly present.  Respiratory: Effort normal. No stridor. No respiratory distress.  GI: Soft. She exhibits no distension.  Musculoskeletal:     Comments: No edema or tenderness in extremities  Neurological: She is alert.  Dysarthria Motor: Grossly 5/5 throughout Minimal ataxia right upper extremity  Skin: Skin is warm.  Psychiatric: She has a normal mood and affect. Her behavior is normal.     Assessment/Plan: 1. 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  Physiatrist is providing close team supervision and 24 hour management of active medical problems listed below.  Physiatrist and rehab team continue to assess barriers to discharge/monitor patient progress toward functional and medical goals  Care Tool:  Bathing  Bathing activity did not occur: Safety/medical concerns           Bathing assist       Upper Body Dressing/Undressing Upper body dressing   What is the patient wearing?: Hospital gown only    Upper body assist Assist Level: Minimal Assistance - Patient > 75%    Lower Body Dressing/Undressing Lower body dressing      What is the patient wearing?: Incontinence brief     Lower body assist Assist for lower body dressing: Minimal Assistance - Patient > 75%     Toileting Toileting    Toileting assist Assist for toileting: Minimal Assistance - Patient > 75%     Transfers Chair/bed transfer  Transfers assist  Chair/bed transfer activity did not occur: Safety/medical concerns  Chair/bed transfer assist level: Moderate Assistance - Patient 50 - 74%     Locomotion Ambulation   Ambulation assist      Assist level: Moderate Assistance - Patient 50 - 74% Assistive device: No Device Max distance: 10'   Walk 10 feet activity   Assist     Assist level: Moderate Assistance - Patient - 50 - 74% Assistive device: No Device   Walk 50 feet activity   Assist  Walk 50 feet with 2 turns activity did not occur: Safety/medical concerns         Walk 150 feet activity   Assist Walk 150 feet activity did not occur: Safety/medical concerns         Walk 10 feet on uneven surface  activity   Assist Walk 10 feet on uneven surfaces activity did not occur: Safety/medical concerns         Wheelchair     Assist Will patient use wheelchair at discharge?: No      Wheelchair assist level: Dependent - Patient 0%      Wheelchair 50 feet with 2 turns  activity    Assist        Assist Level: Dependent - Patient 0%   Wheelchair 150 feet activity     Assist          Blood pressure 108/66, pulse 62, temperature 98.3 F (36.8 C), temperature source Oral, resp. rate 16, height 5\' 8"  (1.727 m), weight 73.9 kg, SpO2 96 %.  Medical Problem List and Plan: 1. 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 .              -patient may shower             -ELOS/Goals:-13 days/supervision/min A             Admit to CIR 2.  Antithrombotics: -DVT/anticoagulation:  Will add SQ lovenox as 7 days out from bleed and high risk for DVTs             -antiplatelet therapy: N/A 3. Pain Management: tylenol prn.  4. Mood: LCSW to follow for evaluation and support.              -antipsychotic agents: N/a 5. Neuropsych: This patient is not fully capable of making decisions on her own behalf. 6. Skin/Wound Care: Routine pressure relief measures.  7. Fluids/Electrolytes/Nutrition:  Monitor I/O.  CMP ordered for tomorrow. 8. 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 Cont 5 mg daily until Neuro/Onc follow up. 9. Acute  AKI: Encourage fluid intake to help resolve dehydration.             CMP ordered for tomorrow.  1/28- BUN is 24 and Cr is 0.94 10. Leucocytosis: Monitor for signs of infection. Will order UCS to monitor for efficacy of treatment.             1/28- WBC down to 11.4 from 12.3- f/u U/A negative- likely from decadron.     LOS: 1 days A FACE TO FACE EVALUATION WAS PERFORMED  Hailey Hill 12/05/2019, 9:51 AM

## 2019-12-06 ENCOUNTER — Inpatient Hospital Stay (HOSPITAL_COMMUNITY): Payer: Medicare HMO | Admitting: Physical Therapy

## 2019-12-06 ENCOUNTER — Inpatient Hospital Stay (HOSPITAL_COMMUNITY): Payer: Medicare HMO

## 2019-12-06 ENCOUNTER — Inpatient Hospital Stay (HOSPITAL_COMMUNITY): Payer: Medicare HMO | Admitting: Speech Pathology

## 2019-12-06 DIAGNOSIS — Z7189 Other specified counseling: Secondary | ICD-10-CM

## 2019-12-06 DIAGNOSIS — Z515 Encounter for palliative care: Secondary | ICD-10-CM

## 2019-12-06 NOTE — Progress Notes (Signed)
Physical Therapy Session Note  Patient Details  Name: Hailey Hill MRN: [id number] Date of Birth: Feb 24, 1951  Today's Date: 12/06/2019 PT Individual Time: 1300-1400 PT Individual Time Calculation (min): 60 min   Short Term Goals: Week 1:  PT Short Term Goal 1 (Week 1): Pt will perform bed mobility with supervision PT Short Term Goal 2 (Week 1): Pt will be able to perform functional transfers with CGA PT Short Term Goal 3 (Week 1): Pt will be able to gait x 150' with min assist PT Short Term Goal 4 (Week 1): Pt will be able to perform stairs with R rail with min assist  Skilled Therapeutic Interventions/Progress Updates:    Pt received seated in bed, agreeable to PT session. No complaints of pain. Pt reports incontinence of urine in brief. Per pt report she was unable to get up independently to use the bathroom due to her bed alarm and therefore was incontinent. Education with patient that due to safety precautions and her current level of fall risk she needs to call for assist to get up the bathroom, reoriented to call button. Pt perseverates on being unable to toilet independently throughout session, cues to be redirected to therapy tasks. Bed mobility CGA. Sit to stand with CGA to RW, min A to change brief and setup A for pericare. Stand pivot transfer bed to w/c with RW and CGA. Pt soiled through bed linens during incontinent episode. Pt given task of remembering 3 items to collect from linen cart in order to make up her bed: bedsheet, pillow case, blanket. Pt is able to propel herself in w/c with use of B UE/LE x 100 ft to/from linen cart. Pt is able to recall 3/3 items once she arrives at linen cart. Pt is able to carry items in her lap while propelling herself back to her room. Assisted pt with making up her bead while seated in w/c with focus on safety awareness and leaning outside BOS while maintaining dynamic sitting balance. Ambulation x 100 ft with RW with mod A overall due to some  posterior leaning in standing and mod A needed for balance recovery, ataxic limbs during gait and some assist needed for safe RW management. Pt requests to return to bed at end of session. Stand pivot transfer back to bed with RW and CGA. Pt requires max v/c for safety as she attempts to stand up from w/c prior to locking it and has uncontrolled descent when sitting down. Sit to supine CGA. Pt left semi-reclined in bed with needs in reach at end of session.  Therapy Documentation Precautions:  Precautions Precautions: Fall Precaution Comments: posterior bias; R inattention Restrictions Weight Bearing Restrictions: No    Therapy/Group: Individual Therapy   Excell Seltzer, PT, DPT  12/06/2019, 3:27 PM

## 2019-12-06 NOTE — Plan of Care (Signed)
Behavioral Plan   [place] Level: VII  Behavior to decrease/ eliminate:  Distractibility, impulsivity,    Changes to environment: N/A   Interventions:   To help pt pay attention to tasks, have pt say what it is she is supposed to be doing and then remind her of the task that she is supposed to be doing.   To help with impulsivity, have pt tell you when she is going to being movement during transfers. Don't leave pt unattended while sitting edge of bed. Don't leave pt unattended in bathroom.     Recommendations for interactions with patient:   Turn TV off when interacting with pt.  Give simple instructions, give information one piece at a time. Check for understanding.    Attendees:   Stormy Fabian, SLP Excell Seltzer, PT Roanna Epley, COTA/L

## 2019-12-06 NOTE — Care Management (Signed)
Inpatient Rehabilitation Center Individual Statement of Services  Patient Name:  Hailey Hill  Date:  12/06/2019  Welcome to the [facility].  Our goal is to provide you with an individualized program based on your diagnosis and situation, designed to meet your specific needs.  With this comprehensive rehabilitation program, you will be expected to participate in at least 3 hours of rehabilitation therapies Monday-Friday, with modified therapy programming on the weekends.  Your rehabilitation program will include the following services:  Physical Therapy (PT), Occupational Therapy (OT), Speech Therapy (ST), 24 hour per day rehabilitation nursing, Therapeutic Recreaction (TR), Neuropsychology, Case Management (Social Worker), Rehabilitation Medicine, Nutrition Services and Pharmacy Services  Weekly team conferences will be held on Tuesdays to discuss your progress.  Your Social Worker will talk with you frequently to get your input and to update you on team discussions.  Team conferences with you and your family in attendance may also be held.  Expected length of stay: 10-12 days   Overall anticipated outcome: Supervision  Depending on your progress and recovery, your program may change. Your Social Worker will coordinate services and will keep you informed of any changes. Your Social Worker's name and contact numbers are listed  below.  The following services may also be recommended but are not provided by the [facility] will be made to provide these services after discharge if needed.  Arrangements include referral to agencies that provide these services.  Your insurance has been verified to be:  [employer]  Your primary doctor is:  [name]  Pertinent information will be shared with your  doctor and your insurance company.  Social Worker: [name], MSW  Information discussed with and copy given to patient by: Rana Snare, 12/06/2019, 9:13 AM

## 2019-12-06 NOTE — Progress Notes (Signed)
[facility] PHYSICAL MEDICINE & REHABILITATION PROGRESS NOTE   Subjective/Complaints:   Pt reports she's feeling weak this AM- just got back to bed from going to bathroom with nursing staff- made her feel weak.  Slept poorly again.   ROS: denies SOB, CP, abd pain, HA, vision changes, N/V/D/C.  Objective:   No results found. Recent Labs    12/04/19 0420 12/05/19 0512  WBC 12.3* 11.4*  HGB 14.8 14.7  HCT 43.4 42.1  PLT 298 262   Recent Labs    12/04/19 0420 12/05/19 0512  NA 139 140  K 4.5 4.1  CL 106 101  CO2 22 26  GLUCOSE 113* 95  BUN 25* 24*  CREATININE 0.78 0.94  CALCIUM 9.3 9.3    Intake/Output Summary (Last 24 hours) at 12/06/2019 0847 Last data filed at 12/05/2019 2043 Gross per 24 hour  Intake 960 ml  Output --  Net 960 ml     Physical Exam: Vital Signs Blood pressure 106/62, pulse 60, temperature 97.7 F (36.5 C), temperature source Oral, resp. rate 16, height 5\' 8"  (1.727 m), weight 73.9 kg, SpO2 99 %.  Physical Exam  Vitals reviewed. Constitutional: She appears well-developed and well-nourished.  Awake, alert, appropriate, unusual affect; wearing winter hat in bed; NAD HENT:  Head: Normocephalic and atraumatic.  Eyes: EOM are normal.  Neck: No tracheal deviation present. No thyromegaly present.  CV: RRR Respiratory: Effort normal. No stridor. No respiratory distress. CTA B/L GI: Soft. She exhibits no distension. (+)BS Musculoskeletal:     Comments: No edema or tenderness in extremities  Neurological: She is alert.  Dysarthria Motor: Grossly 5/5 throughout Minimal ataxia right upper extremity  Skin: Skin is warm.  Psychiatric: She has a normal mood and affect. Her behavior is normal.     Assessment/Plan: 1. 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  Physiatrist is providing close team supervision and 24 hour management of active medical problems listed below.  Physiatrist and rehab team continue to assess barriers to discharge/monitor patient progress toward functional and medical goals  Care Tool:  Bathing  Bathing activity did not occur: Safety/medical concerns Body parts bathed by patient: Left arm, Right arm, Chest, Abdomen, Front perineal area, Buttocks, Right upper leg, Left upper leg, Face   Body parts bathed by helper: Right lower leg, Left lower leg     Bathing assist Assist Level: Minimal Assistance - Patient > 75%     Upper Body Dressing/Undressing Upper body dressing   What is the patient wearing?: Pull over shirt    Upper body assist Assist Level: Minimal Assistance - Patient > 75%    Lower Body Dressing/Undressing Lower body dressing      What is the patient wearing?: Incontinence brief, Pants     Lower body assist Assist for lower body dressing: Minimal Assistance - Patient > 75%     Toileting Toileting    Toileting assist Assist for toileting: Moderate Assistance - Patient 50 - 74%     Transfers Chair/bed transfer  Transfers assist  Chair/bed transfer activity did not occur: Safety/medical concerns  Chair/bed transfer assist level: Minimal Assistance - Patient > 75%     Locomotion Ambulation   Ambulation assist      Assist level: Moderate Assistance - Patient 50 - 74% Assistive device: No Device Max distance: 80'  Walk 10 feet activity   Assist     Assist level: Moderate Assistance - Patient - 50 - 74% Assistive device: No Device   Walk 50 feet activity   Assist Walk 50 feet with 2 turns activity did not occur: Safety/medical concerns         Walk 150 feet activity   Assist Walk 150 feet activity did not occur: Safety/medical concerns         Walk 10 feet on uneven surface  activity   Assist Walk 10 feet on uneven surfaces activity did  not occur: Safety/medical concerns         Wheelchair     Assist Will patient use wheelchair at discharge?: No      Wheelchair assist level: Dependent - Patient 0%      Wheelchair 50 feet with 2 turns activity    Assist        Assist Level: Dependent - Patient 0%   Wheelchair 150 feet activity     Assist          Blood pressure 106/62, pulse 60, temperature 97.7 F (36.5 C), temperature source Oral, resp. rate 16, height 5\' 8"  (1.727 m), weight 73.9 kg, SpO2 99 %.  Medical Problem List and Plan: 1. 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 .              -patient may shower             -ELOS/Goals:-13 days/supervision/min A             Admit to CIR 2.  Antithrombotics: -DVT/anticoagulation:  Will add SQ lovenox as 7 days out from bleed and high risk for DVTs             -antiplatelet therapy: N/A 3. Pain Management: tylenol prn.  4. Mood: LCSW to follow for evaluation and support.              -antipsychotic agents: N/a 5. Neuropsych: This patient is not fully capable of making decisions on her own behalf. 6. Skin/Wound Care: Routine pressure relief measures.  7. Fluids/Electrolytes/Nutrition:  Monitor I/O.  CMP ordered for tomorrow. 8. 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 Cont 5 mg daily until Neuro/Onc follow up. 9. Acute  AKI: Encourage fluid intake to help resolve dehydration.             CMP ordered for tomorrow.  1/28- BUN is 24 and Cr is 0.94 10. Leucocytosis: Monitor for signs of infection. Will order UCS to monitor for efficacy of treatment.             1/28- WBC down to 11.4 from 12.3- f/u U/A negative- likely from decadron.     LOS: 2 days A FACE TO FACE EVALUATION WAS  PERFORMED  Esiquio Boesen 12/06/2019, 8:47 AM

## 2019-12-06 NOTE — Progress Notes (Signed)
Speech Language Pathology Daily Session Note  Patient Details  Name: Hailey Hill MRN: [id number] Date of Birth: 07-03-1951  Today's Date: 12/06/2019 SLP Individual Time: 1015-1100 SLP Individual Time Calculation (min): 45 min  Short Term Goals: Week 1: SLP Short Term Goal 1 (Week 1): Pt will demonstrate selective attention for 5 minutes with Min A cues. SLP Short Term Goal 2 (Week 1): Pt will complete complex problem solving tasks with Min A cues. SLP Short Term Goal 3 (Week 1): Pt will demonstrate intellectual awareness by identifying 2 physical and 2 cognitive deficits with Min A cues. SLP Short Term Goal 4 (Week 1): Given Min A cues, pt will utilize compensatory memory strategies to recall daily information in 7 out of 10 opportunities.  Skilled Therapeutic Interventions:  Skilled treatment session targeted cognitive goals, specifically intellectual awareness. Pt is largely unaware of deficits in body awareness, selective attention, memory and impulsivity. SLP provided review of pt's sessions with PT/OT. Pt required Maximal assistance to recall deficits within each session. SLP facilitated by creating list of areas to target starting with attention to task. Strategy created for pt to verbalize what activity she needed to focus on and then remind herself of that. This strategy was also incorporated into pt's behavior plan. Pt left upright in bed, bed alarm on and all needs within reach. Continue per current plan of care.      Pain Pain Assessment Pain Scale: 0-10 Pain Score: 0-No pain  Therapy/Group: Individual Therapy  Raeqwon Lux 12/06/2019, 11:57 AM

## 2019-12-06 NOTE — Progress Notes (Signed)
Occupational Therapy Session Note  Patient Details  Name: Hailey Hill MRN: [id number] Date of Birth: 06-20-1951  Today's Date: 12/06/2019 OT Individual Time: 0800-0910 OT Individual Time Calculation (min): 70 min    Short Term Goals: Week 1:  OT Short Term Goal 1 (Week 1): Pt will stand to perform 2 grooming tasks at the sink with close supervision OT Short Term Goal 2 (Week 1): Pt will perform toileting wtih close supervision OT Short Term Goal 3 (Week 1): Pt will demonstrate daily recall of familar ADL tasks with min  A OT Short Term Goal 4 (Week 1): Pt will pick out clothing and sequencing donning clothing with only min cues  Skilled Therapeutic Interventions/Progress Updates:    Pt resting in bed upon arrival.  Pt declined bathing/dressing this morning stating that she didn't have any clean clothing.  Pt stated her husband is bringing in more clothing in the afternoon. Pt agreeable to getting OOB and participating in therapy.  Supine>sit EOB with supervision. Sit<>stand from EOB with CGA. Stand pivot transfer to w/c with CGA. Pt initially practiced TTB transfers in ADL apartment with CGA after demonstration. Pt propelled w/c to Day Room.  Pt requires more than a reasonable amount of time and stops at each open door to glance in. NuStep on load 5 for 5:40. Pt c/o nausea. Emesis bag and cold wash cloth provided. Mask removed.  Pt commented that she might have gotten overheated.  Pt c/o double vision resolved when one eye closed. Pt also c/o "dizziness" with supine<>sit but not during any other transitional movements. Dizziness quickly resolves. Pt returned to bed at request.  Pt remained in bed with all needs within reach and bed alarm activated.   Therapy Documentation Precautions:  Precautions Precautions: Fall Precaution Comments: posterior bias; R inattention Restrictions Weight Bearing Restrictions: No Pain:  Pt denies pain this morning   Therapy/Group: Individual  Therapy  [name] 12/06/2019, 9:13 AM

## 2019-12-07 ENCOUNTER — Inpatient Hospital Stay (HOSPITAL_COMMUNITY): Payer: Medicare HMO | Admitting: Occupational Therapy

## 2019-12-07 ENCOUNTER — Inpatient Hospital Stay (HOSPITAL_COMMUNITY): Payer: Medicare HMO | Admitting: Speech Pathology

## 2019-12-07 ENCOUNTER — Inpatient Hospital Stay (HOSPITAL_COMMUNITY): Payer: Medicare HMO | Admitting: Physical Therapy

## 2019-12-07 NOTE — Progress Notes (Signed)
Occupational Therapy Session Note  Patient Details  Name: Hailey Hill MRN: [id number] Date of Birth: 01/22/1951  Today's Date: 12/07/2019 OT Individual Time: 1100-1156 OT Individual Time Calculation (min): 56 min    Short Term Goals: Week 1:  OT Short Term Goal 1 (Week 1): Pt will stand to perform 2 grooming tasks at the sink with close supervision OT Short Term Goal 2 (Week 1): Pt will perform toileting wtih close supervision OT Short Term Goal 3 (Week 1): Pt will demonstrate daily recall of familar ADL tasks with min  A OT Short Term Goal 4 (Week 1): Pt will pick out clothing and sequencing donning clothing with only min cues  Skilled Therapeutic Interventions/Progress Updates:    Patient in bed, alert, occ verbal out bursts but generally cooperative.  ABS scale completed.  SPT with RW bed to w/c with CGA/steadying assist.  Patient able to propel w/c to and from therapy gym with min A to avoid obstacles.  Reviewed goals for therapy and determined that balance was a concern.  Completed dynavision for coordination and balance:  2 minutes whole screen bilateral UEs seated - trial 1 = 2.86sec, trial 2 = 2.61sec                                             2 min, whole screen, B UE, in stance with walker - trial 1 = 2.31sec, trial 2 = 2.07sec Completed UB ergometer in stance with CGA/min A to maintain balance for 3 minutes Completed ambulation with RW around obstacles x2 with CGA/min A Completed w/c push ups with focus on controlled decent x8 Returned to bed at close of session, CGA for SPT with RW.  CS for sitting to supine position.  She was able to recall all activities completed during session.  Bed alarm set and call bell in reach.  Therapy Documentation Precautions:  Precautions Precautions: Fall Precaution Comments: posterior bias; R inattention Restrictions Weight Bearing Restrictions: No General:   Vital Signs:   Pain: Pain Assessment Pain Scale: 0-10 Pain Score: 0-No  pain   Other Treatments:     Therapy/Group: Individual Therapy  Carlos Levering 12/07/2019, 12:29 PM

## 2019-12-07 NOTE — Progress Notes (Signed)
Physical Therapy Session Note  Patient Details  Name: Hailey Hill MRN: [id number] Date of Birth: 04-Oct-1951  Today's Date: 12/07/2019 PT Individual Time: 9562-1308 PT Individual Time Calculation (min): 75 min   Short Term Goals: Week 1:  PT Short Term Goal 1 (Week 1): Pt will perform bed mobility with supervision PT Short Term Goal 2 (Week 1): Pt will be able to perform functional transfers with CGA PT Short Term Goal 3 (Week 1): Pt will be able to gait x 150' with min assist PT Short Term Goal 4 (Week 1): Pt will be able to perform stairs with R rail with min assist  Skilled Therapeutic Interventions/Progress Updates:   Pt received supine in bed and agreeable to PT. Supine>sit transfer with supervision assist.   Ambulatory transfer to toilet with min assist and RW. Pt able to complete pericare with supervision assist. Min assist to maintain balance while pulling pants to waist. ambulatory transfer to sink with RW. Min-mod assist to maintain balance while performing hand hygiene.   WC mobilty through hall x 221f with CGA-supervision assist for safety to prevent hitting foot on door frame and turning technique.   Gait training with RW x 1531fand min assist overall. Cues for safety in turns and attention to task throughout as well as improved forward gaze as tolerated.   Dynamic balance training and NMR: foot taps on 6 inch cone 2 x 10 BLE with min fading to mod assist with constant R LOB. Pt with poor carryover when cued to decrease speed and maintain wide BOS. Ball hold with no UEsupport 2 x 30sec; min assist. Tapping ball on R and L RW arm rest with min assist 2 x 10. Ball raise to eye level and back to waist with mod assist to prevent posterior LOB. Moderate cues for ankle strategy to prevent LOB throughout.    nustep endurance training x 8 mintues with cues for decreased speed to prevent excessive earlt fatigue and symmetry of movement R and L.  Pt returned to room and performed  stand pivot transfer to bed with RW and min assist. Sit>supine completed with supervision assist, and left supine in bed with call bell in reach and all needs met.      Therapy Documentation Precautions:  Precautions Precautions: Fall Precaution Comments: posterior bias; R inattention Restrictions Weight Bearing Restrictions: No    Vital Signs: Therapy Vitals Temp: 98.9 F (37.2 C) Temp Source: Oral Pulse Rate: 79 Resp: 18 BP: 97/67 Patient Position (if appropriate): Lying Oxygen Therapy SpO2: 95 % O2 Device: Room Air Pain: denies   Therapy/Group: Individual Therapy  AuLorie Phenix/30/2021, 4:50 PM

## 2019-12-07 NOTE — Progress Notes (Signed)
Speech Language Pathology Daily Session Note  Patient Details  Name: Hailey Hill MRN: [id number] Date of Birth: 24-Apr-1951  Today's Date: 12/07/2019 SLP Individual Time: 1448-1856 SLP Individual Time Calculation (min): 45 min  Short Term Goals: Week 1: SLP Short Term Goal 1 (Week 1): Pt will demonstrate selective attention for 5 minutes with Min A cues. SLP Short Term Goal 2 (Week 1): Pt will complete complex problem solving tasks with Min A cues. SLP Short Term Goal 3 (Week 1): Pt will demonstrate intellectual awareness by identifying 2 physical and 2 cognitive deficits with Min A cues. SLP Short Term Goal 4 (Week 1): Given Min A cues, pt will utilize compensatory memory strategies to recall daily information in 7 out of 10 opportunities.  Skilled Therapeutic Interventions:  Skilled treatment session targeted cognition goals. Goals of attention, problem solving and task tolerance were targeted in medication management task. Pt is inconsistent historian, initially she reports that she doesn't take medicine at home or while in the hospital, however she is able to recall all medicines once prompted by SLP to recall what she had been administered by nursing this morning. Pt resistant to utilizing pill organizer as she doesn't use one at home. Education provided on cognitive areas are that are being targeted during task. Pt required Mod A to figure out how to open pill organizer, she broke off pieces x 2 with no realization that she needed to change approach to opening pill box. Pt with significantly low task tolerance and stated "there is no pleasing you, is there? I tell everyone that what we do in speech therapy is just stupid." Education provided that comments such as that demonstrates lack of insight into pt's deficits and notes from OT/PT were reviewed with examples of how "what we are doing" impacts all activities of daily living.        Pain Pain Assessment Pain Scale: 0-10 Pain Score:  0-No pain  Therapy/Group: Individual Therapy  Shirely Toren 12/07/2019, 11:45 AM

## 2019-12-07 NOTE — IPOC Note (Signed)
Overall Plan of Care [facility]) Patient Details Name: Hailey Hill MRN: [id number] DOB: 02/02/1951  Admitting Diagnosis: Lung cancer metastatic to brain [facility])  Hospital Problems: Principal Problem:   Lung cancer metastatic to brain [facility]) Active Problems:   Small cell lung cancer ([place])   Metastatic lung cancer (metastasis from lung to other site) [facility])   Metastatic cancer to brain [facility])   Intracranial hemorrhage, nontraumatic ([place])   Leukocytosis   AKI (acute kidney injury) ([place])     Functional Problem List: Nursing Bladder, Bowel, Endurance  PT Balance, Behavior, Endurance, Motor, Perception, Safety, Pain  OT Balance, Safety, Cognition, Endurance, Motor, Perception  SLP Cognition, Perception  TR         Basic ADL's: OT Grooming, Bathing, Dressing, Toileting     Advanced  ADL's: OT       Transfers: PT Bed Mobility, Bed to Chair, Car, Manufacturing systems engineer, Metallurgist: PT Ambulation, Stairs     Additional Impairments: OT Fuctional Use of Upper Extremity  SLP Social Cognition   Problem Solving, Memory, Attention, Awareness  TR      Anticipated Outcomes Item Anticipated Outcome  Self Feeding n/a  Swallowing      Basic self-care  supervision  Toileting  supervision   Bathroom Transfers supervision  Bowel/Bladder  mod I assist  Transfers  supervision to CGA overall  Locomotion  supervision to CGA overall ambulatory  Communication     Cognition  Supervision  Pain  < 3  Safety/Judgment  Mod I assist   Therapy Plan: PT Intensity: Minimum of 1-2 x/day ,45 to 90 minutes PT Frequency: 5 out of 7 days PT Duration Estimated Length of Stay: 10-12 days OT Intensity: Minimum of 1-2 x/day, 45 to 90 minutes OT Frequency: 5 out of 7 days OT Duration/Estimated Length of Stay: 10-14 days SLP Intensity: Minumum of 1-2 x/day, 30 to 90 minutes SLP Frequency: 3 to 5 out of 7 days SLP Duration/Estimated Length of Stay: 10-12 days   Due to the  current state of emergency, patients may not be receiving their 3-hours of Medicare-mandated therapy.   Team Interventions: Nursing Interventions Patient/Family Education, Bowel Management, Bladder Management, Disease Management/Prevention, Cognitive Remediation/Compensation, Medication Management  PT interventions Ambulation/gait training, Balance/vestibular training, Cognitive remediation/compensation, Community reintegration, Discharge planning, Disease management/prevention, DME/adaptive equipment instruction, Functional mobility training, Neuromuscular re-education, Pain management, Patient/family education, Psychosocial support, Splinting/orthotics, Stair training, Therapeutic Activities, Therapeutic Exercise, UE/LE Strength taining/ROM, UE/LE Coordination activities, Wheelchair propulsion/positioning, Visual/perceptual remediation/compensation  OT Interventions Balance/vestibular training, Discharge planning, Pain management, Self Care/advanced ADL retraining, Therapeutic Activities, UE/LE Coordination activities, Cognitive remediation/compensation, Disease mangement/prevention, Functional mobility training, Patient/family education, Skin care/wound managment, Therapeutic Exercise, Visual/perceptual remediation/compensation, Community reintegration, Engineer, drilling, Neuromuscular re-education, Psychosocial support, UE/LE Strength taining/ROM  SLP Interventions Cognitive remediation/compensation, Functional tasks, Patient/family education  TR Interventions    SW/CM Interventions Discharge Planning, Psychosocial Support, Patient/Family Education   Barriers to Discharge MD  Medical stability  Nursing      PT Decreased caregiver support, Behavior, Pending chemo/radiation unsure if husband taking time off of work; recommending 24/7 supervision  OT Incontinence    SLP      SW Other (comments) Metastatic cancer   Team Discharge Planning: Destination: PT-Home ,OT- Home ,  SLP-Home Projected Follow-up: PT-Home health PT, 24 hour supervision/assistance, OT-  Home health OT, SLP-Home Health SLP, 24 hour supervision/assistance Projected Equipment Needs: PT-Rolling walker with 5" wheels, OT- To be determined, SLP-None recommended by SLP Equipment Details: PT-has w/c (hers)  and access to rollator (family member), OT-  Patient/family involved in discharge planning: PT- Patient,  OT-Patient, SLP-Patient  MD ELOS: 13 days Medical Rehab Prognosis:  Excellent Assessment: Hailey Hill has good knowledge of her medical condition and asks good questions. WBC decreasing. MinA transfers, ambulated 200 feet CG-S in hallways and engaged in dynamic balance. Engaged in [place]. ModA to figure out how to open pill box, low task tolerance.    See Team Conference Notes for weekly updates to the plan of care

## 2019-12-07 NOTE — Progress Notes (Signed)
[place] PHYSICAL MEDICINE & REHABILITATION PROGRESS NOTE   Subjective/Complaints: Patient is feeling well this morning. She asks why her seizure medications were stopped before this admission. Denies pain She does feel constipated but does not want any stool softeners  ROS: denies SOB, CP, abd pain, HA, vision changes, N/V/D/C.  Objective:   No results found. Recent Labs    12/05/19 0512  WBC 11.4*  HGB 14.7  HCT 42.1  PLT 262   Recent Labs    12/05/19 0512  NA 140  K 4.1  CL 101  CO2 26  GLUCOSE 95  BUN 24*  CREATININE 0.94  CALCIUM 9.3    Intake/Output Summary (Last 24 hours) at 12/07/2019 1301 Last data filed at 12/07/2019 0852 Gross per 24 hour  Intake 480 ml  Output --  Net 480 ml     Physical Exam: Vital Signs Blood pressure 112/66, pulse 65, temperature 98.2 F (36.8 C), resp. rate 16, height 5\' 8"  (1.727 m), weight 73.9 kg, SpO2 100 %.  Physical Exam  Vitals reviewed. Constitutional: She appears well-developed and well-nourished.  Awake, alert, appropriate, unusual affect; wearing winter hat in bed; NAD HENT:  Head: Normocephalic and atraumatic.  Eyes: EOM are normal.  Neck: No tracheal deviation present. No thyromegaly present.  CV: RRR Respiratory: Effort normal. No stridor. No respiratory distress. CTA B/L GI: Soft. She exhibits no distension. (+)BS Musculoskeletal:     Comments: No edema or tenderness in extremities  Neurological: She is alert. Asks very good questions and demonstrates knowledge of her medical condition and hospital course. Dysarthria Motor: Grossly 5/5 throughout Minimal ataxia right upper extremity  Skin: Skin is warm.  Psychiatric: She has a normal mood and affect. Her behavior is normal.     Assessment/Plan: 1. 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  Physiatrist is providing close team supervision and 24 hour management of active medical problems listed below.  Physiatrist and rehab team continue to assess barriers to discharge/monitor patient progress toward functional and medical goals  Care Tool:  Bathing  Bathing activity did not occur: Safety/medical concerns Body parts bathed by patient: Left arm, Right arm, Chest, Abdomen, Front perineal area, Buttocks, Right upper leg, Left upper leg, Face   Body parts bathed by helper: Right lower leg, Left lower leg     Bathing assist Assist Level: Minimal Assistance - Patient > 75%     Upper Body Dressing/Undressing Upper body dressing   What is the patient wearing?: Pull over shirt    Upper body assist Assist Level: Minimal Assistance - Patient > 75%    Lower Body Dressing/Undressing Lower body dressing      What is the patient wearing?: Incontinence brief     Lower body assist Assist for lower body dressing: Minimal Assistance - Patient > 75%     Toileting Toileting    Toileting assist Assist for toileting: Moderate Assistance - Patient 50 - 74%     Transfers Chair/bed transfer  Transfers assist  Chair/bed transfer activity did not occur: Safety/medical concerns  Chair/bed transfer assist level: Minimal Assistance - Patient > 75%     Locomotion Ambulation   Ambulation assist      Assist level: Moderate Assistance - Patient 50 - 74% Assistive device: Walker-rolling Max distance: 100'   Walk 10 feet activity   Assist  Assist level: Moderate Assistance - Patient - 50 - 74% Assistive device: Walker-rolling   Walk 50 feet activity   Assist Walk 50 feet with 2 turns activity did not occur: Safety/medical concerns  Assist level: Moderate Assistance - Patient - 50 - 74% Assistive device: Walker-rolling    Walk 150 feet activity   Assist Walk 150 feet activity did not occur: Safety/medical  concerns         Walk 10 feet on uneven surface  activity   Assist Walk 10 feet on uneven surfaces activity did not occur: Safety/medical concerns         Wheelchair     Assist Will patient use wheelchair at discharge?: No Type of Wheelchair: Manual    Wheelchair assist level: Supervision/Verbal cueing Max wheelchair distance: 150'    Wheelchair 50 feet with 2 turns activity    Assist        Assist Level: Supervision/Verbal cueing   Wheelchair 150 feet activity     Assist      Assist Level: Supervision/Verbal cueing   Blood pressure 112/66, pulse 65, temperature 98.2 F (36.8 C), resp. rate 16, height 5\' 8"  (1.727 m), weight 73.9 kg, SpO2 100 %.  Medical Problem List and Plan: 1. 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 .              -patient may shower             -ELOS/Goals:-13 days/supervision/min A             Continue CIR therapies.   1/30: Patient asks questions about why she was taken off her seizure medications prior to this admission. Explained that since she was seizure free prior to admission her physician likely felt the risks of continued seizure medication outweighed the benefits. Asks the difference between seizure and stroke and I explained to her.  2.  Antithrombotics: -DVT/anticoagulation:  Will add SQ lovenox as 7 days out from bleed and high risk for DVTs             -antiplatelet therapy: N/A 3. Pain Management: tylenol prn.  4. Mood: LCSW to follow for evaluation and support.              -antipsychotic agents: N/a 5. Neuropsych: This patient is not fully capable of making decisions on her own behalf. 6. Skin/Wound Care: Routine pressure relief measures.  7. Fluids/Electrolytes/Nutrition:  Monitor I/O. 8. 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 Cont 5 mg daily until Neuro/Onc follow up. 9. Acute  AKI: Encourage fluid intake to help resolve dehydration.  1/28- BUN is 24 and Cr is 0.94 10. Leucocytosis: Monitor for signs of infection. Will order UCS to monitor for efficacy of treatment.             1/28- WBC down to 11.4 from 12.3- f/u U/A negative- likely from decadron.     LOS: 3 days A FACE TO FACE EVALUATION WAS PERFORMED  Baron Parmelee P Akram Kissick 12/07/2019, 1:01 PM

## 2019-12-08 ENCOUNTER — Inpatient Hospital Stay (HOSPITAL_COMMUNITY): Payer: Medicare HMO | Admitting: Speech Pathology

## 2019-12-08 ENCOUNTER — Inpatient Hospital Stay (HOSPITAL_COMMUNITY): Payer: Medicare HMO

## 2019-12-08 NOTE — Progress Notes (Signed)
[place] PHYSICAL MEDICINE & REHABILITATION PROGRESS NOTE   Subjective/Complaints: Sleeping this morning, easily arousable. Denies pain and constipation. Slept well last night. Vitals stable.   ROS: denies SOB, CP, abd pain, HA, vision changes, N/V/D/C.  Objective:   No results found. No results for input(s): WBC, HGB, HCT, PLT in the last 72 hours. No results for input(s): NA, K, CL, CO2, GLUCOSE, BUN, CREATININE, CALCIUM in the last 72 hours.  Intake/Output Summary (Last 24 hours) at 12/08/2019 1052 Last data filed at 12/08/2019 0817 Gross per 24 hour  Intake 700 ml  Output --  Net 700 ml     Physical Exam: Vital Signs Blood pressure 106/60, pulse 69, temperature 98 F (36.7 C), temperature source Oral, resp. rate 18, height 5\' 8"  (1.727 m), weight 73.9 kg, SpO2 100 %.  Physical Exam  Vitals reviewed. Constitutional: She appears well-developed and well-nourished. Sleeping comfortably, easily arousable.  Awake, alert, appropriate, unusual affect; wearing winter hat in bed; NAD HENT:  Head: Normocephalic and atraumatic.  Eyes: EOM are normal.  Neck: No tracheal deviation present. No thyromegaly present.  CV: RRR Respiratory: Effort normal. No stridor. No respiratory distress. CTA B/L GI: Soft. She exhibits no distension. (+)BS Musculoskeletal:     Comments: No edema or tenderness in extremities  Neurological: She is alert. Asks very good questions and demonstrates knowledge of her medical condition and hospital course. Dysarthria Motor: Grossly 5/5 throughout Minimal ataxia right upper extremity  Skin: Skin is warm.  Psychiatric: She has a normal mood and affect. Her behavior is normal.     Assessment/Plan: 1. 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  Physiatrist is providing close team supervision and 24 hour management of active medical problems listed below.  Physiatrist and rehab team continue to assess barriers to discharge/monitor patient progress toward functional and medical goals  Care Tool:  Bathing  Bathing activity did not occur: Safety/medical concerns Body parts bathed by patient: Left arm, Right arm, Chest, Abdomen, Front perineal area, Buttocks, Right upper leg, Left upper leg, Face   Body parts bathed by helper: Right lower leg, Left lower leg     Bathing assist Assist Level: Minimal Assistance - Patient > 75%     Upper Body Dressing/Undressing Upper body dressing   What is the patient wearing?: Pull over shirt    Upper body assist Assist Level: Minimal Assistance - Patient > 75%    Lower Body Dressing/Undressing Lower body dressing      What is the patient wearing?: Incontinence brief     Lower body assist Assist for lower body dressing: Minimal Assistance - Patient > 75%     Toileting Toileting    Toileting assist Assist for toileting: Moderate Assistance - Patient 50 - 74%     Transfers Chair/bed transfer  Transfers assist  Chair/bed transfer activity did not occur: Safety/medical concerns  Chair/bed transfer assist level: Minimal Assistance - Patient > 75%     Locomotion Ambulation   Ambulation assist      Assist level: Minimal Assistance - Patient > 75% Assistive device: Walker-rolling Max distance: 150   Walk 10 feet activity   Assist     Assist level: Minimal Assistance - Patient > 75% Assistive device: Walker-rolling   Walk 50 feet activity   Assist Walk 50 feet with 2 turns activity did  not occur: Safety/medical concerns  Assist level: Minimal Assistance - Patient > 75% Assistive device: Walker-rolling    Walk 150 feet activity   Assist Walk 150 feet activity did not occur: Safety/medical concerns  Assist level: Minimal Assistance - Patient >  75% Assistive device: Walker-rolling    Walk 10 feet on uneven surface  activity   Assist Walk 10 feet on uneven surfaces activity did not occur: Safety/medical concerns         Wheelchair     Assist Will patient use wheelchair at discharge?: No Type of Wheelchair: Manual    Wheelchair assist level: Supervision/Verbal cueing Max wheelchair distance: 150'    Wheelchair 50 feet with 2 turns activity    Assist        Assist Level: Supervision/Verbal cueing   Wheelchair 150 feet activity     Assist      Assist Level: Supervision/Verbal cueing   Blood pressure 106/60, pulse 69, temperature 98 F (36.7 C), temperature source Oral, resp. rate 18, height 5\' 8"  (1.727 m), weight 73.9 kg, SpO2 100 %.  Medical Problem List and Plan: 1. 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 .              -patient may shower             -ELOS/Goals:-13 days/supervision/min A             Continue CIR therapies.   1/30: Patient asks questions about why she was taken off her seizure medications prior to this admission. Explained that since she was seizure free prior to admission her physician likely felt the risks of continued seizure medication outweighed the benefits. Asks the difference between seizure and stroke and I explained to her.  2.  Antithrombotics: -DVT/anticoagulation:  Will add SQ lovenox as 7 days out from bleed and high risk for DVTs             -antiplatelet therapy: N/A 3. Pain Management: tylenol prn.  1/31: denies pain  4. Mood: LCSW to follow for evaluation and support.              -antipsychotic agents: N/a 5. Neuropsych: This patient is not fully capable of making decisions on her own behalf. 6. Skin/Wound Care: Routine pressure relief measures.  7.  Fluids/Electrolytes/Nutrition:  Monitor I/O. 8. 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 Cont 5 mg daily until Neuro/Onc follow up. 9. Acute  AKI: Encourage fluid intake to help resolve dehydration.  1/28- BUN is 24 and Cr is 0.94 10. Leucocytosis: Monitor for signs of infection. Will order UCS to monitor for efficacy of treatment.             1/28- WBC down to 11.4 from 12.3- f/u U/A negative- likely from decadron.     LOS: 4 days A FACE TO FACE EVALUATION WAS PERFORMED  Hailey Hill 12/08/2019, 10:52 AM

## 2019-12-08 NOTE — Progress Notes (Signed)
Physical Therapy Session Note  Patient Details  Name: Hailey Hill MRN: [id number] Date of Birth: 03/11/1951  Today's Date: 12/08/2019 PT Individual Time: [phone] PT Individual Time Calculation (min): 60 min   Short Term Goals: Week 1:  PT Short Term Goal 1 (Week 1): Pt will perform bed mobility with supervision PT Short Term Goal 2 (Week 1): Pt will be able to perform functional transfers with CGA PT Short Term Goal 3 (Week 1): Pt will be able to gait x 150' with min assist PT Short Term Goal 4 (Week 1): Pt will be able to perform stairs with R rail with min assist  Skilled Therapeutic Interventions/Progress Updates:    Session focused on functional mobility retraining with focus on safety with mobility, attention, awareness and balance. Pt performs bed mobility on flat surface with overall supervision. Does report dizziness with sit to supine, stating this occurs at home and she uses a pillow for propping. Denies symptoms with any other mobility and no nystagmus noted with rolling to R or L. Recreated sit to supine to the R and noted nystagmus (up and down beats x ~3 sec before resolved.). Does not occur with sit to supine to the L. May benefit from vestibular evaluation and assessment. She states this began after a previous fall. Discussed various falls and fall risk upon d/c. Pt verbalized understanding but demonstrates poor insight and awareness during actual mobility of when LOB's occur and trying to correct.  Blocked practice sit <> stands with focus on anterior weighshift and functional strengthening as pt with heavy bias to pushing through backs of legs x 10 reps. In supine performed bridging x 10 reps and then x 10 reps x 2 sets while holding ball between knees for increased challenge. Pt with difficulty following progression initially. Stair negotiation training for home entry practice with R rail and bilateral hand support with min to mod assist needed for controlled descent due to  weakness. Functional gait training x 15', x 15', x 120' with overall min assist with noted decreased BOS, shuffled gait pattern and R lateral lean as fatigued with decreased R foot clearance. NMR for balance retraining for functional reaching task outside BOS with RW for UE support with overall min to mod assist needed to posterior and R lateral lean and poor ability to correct for LOB. Discussed recommendation for use of RW at d/c - pt verbalized understanding. End of session impulsively transferred back to bed with stand pivot without AD with mod assist for controlled movement and returned to supine with supervision.   Therapy Documentation Precautions:  Precautions Precautions: Fall Precaution Comments: posterior bias; R inattention Restrictions Weight Bearing Restrictions: No General:   Vital Signs:   Pain: Pain Assessment Pain Scale: 0-10 Pain Score: 0-No pain   Therapy/Group: Individual Therapy  Canary Brim Ivory Broad, PT, DPT, CBIS  12/08/2019, 10:20 AM

## 2019-12-08 NOTE — Progress Notes (Signed)
Speech Language Pathology Daily Session Note  Patient Details  Name: Hailey Hill MRN: [id number] Date of Birth: 12-Apr-1951  Today's Date: 12/08/2019 SLP Individual Time: 1245-1315 SLP Individual Time Calculation (min): 30 min  Short Term Goals: Week 1: SLP Short Term Goal 1 (Week 1): Pt will demonstrate selective attention for 5 minutes with Min A cues. SLP Short Term Goal 2 (Week 1): Pt will complete complex problem solving tasks with Min A cues. SLP Short Term Goal 3 (Week 1): Pt will demonstrate intellectual awareness by identifying 2 physical and 2 cognitive deficits with Min A cues. SLP Short Term Goal 4 (Week 1): Given Min A cues, pt will utilize compensatory memory strategies to recall daily information in 7 out of 10 opportunities.  Skilled Therapeutic Interventions: Pt was seen for skilled ST targeting cognitive goals, specifically short term recall and problem solving. Overall Max A verbal and visual cues provided for error awareness and problem solving during organization of pt's photos on her iPad, which was targeted at her request and was functional for her given her report of interest/activities completed at home. In functional conversation targeting recall, pt used schedule Mod I to recall information about weekend therapies. However, Total A required to recall current medication names and functions, as pt stated "I don't take any". However, when prompted with name of medication she was able to recall she took a red and white pill and inquired as to their functions. Pt left laying in bed with alarm set and needs within reach. Continue per current plan of care.       Pain Pain Assessment Pain Scale: 0-10 Pain Score: 0-No pain  Therapy/Group: Individual Therapy  Arbutus Leas 12/08/2019, 2:58 PM

## 2019-12-09 ENCOUNTER — Encounter (HOSPITAL_COMMUNITY): Payer: Medicare HMO | Admitting: Psychology

## 2019-12-09 ENCOUNTER — Inpatient Hospital Stay (HOSPITAL_COMMUNITY): Payer: Medicare HMO

## 2019-12-09 ENCOUNTER — Inpatient Hospital Stay (HOSPITAL_COMMUNITY): Payer: Medicare HMO | Admitting: Physical Therapy

## 2019-12-09 ENCOUNTER — Inpatient Hospital Stay (HOSPITAL_COMMUNITY): Payer: Medicare HMO | Admitting: Speech Pathology

## 2019-12-09 DIAGNOSIS — Z515 Encounter for palliative care: Secondary | ICD-10-CM

## 2019-12-09 DIAGNOSIS — Z7189 Other specified counseling: Secondary | ICD-10-CM

## 2019-12-09 DIAGNOSIS — F4322 Adjustment disorder with anxiety: Secondary | ICD-10-CM

## 2019-12-09 LAB — CBC
HCT: 41.6 % (ref 36.0–46.0)
Hemoglobin: 14.1 g/dL (ref 12.0–15.0)
MCH: 30.4 pg (ref 26.0–34.0)
MCHC: 33.9 g/dL (ref 30.0–36.0)
MCV: 89.7 fL (ref 80.0–100.0)
Platelets: 266 10*3/uL (ref 150–400)
RBC: 4.64 MIL/uL (ref 3.87–5.11)
RDW: 12.6 % (ref 11.5–15.5)
WBC: 11.5 10*3/uL — ABNORMAL HIGH (ref 4.0–10.5)
nRBC: 0 % (ref 0.0–0.2)

## 2019-12-09 LAB — BASIC METABOLIC PANEL
Anion gap: 10 (ref 5–15)
BUN: 18 mg/dL (ref 8–23)
CO2: 26 mmol/L (ref 22–32)
Calcium: 9.1 mg/dL (ref 8.9–10.3)
Chloride: 102 mmol/L (ref 98–111)
Creatinine, Ser: 0.81 mg/dL (ref 0.44–1.00)
GFR calc Af Amer: >60 mL/min (ref >60)
GFR calc non Af Amer: >60 mL/min (ref >60)
Glucose, Bld: 93 mg/dL (ref 70–99)
Potassium: 3.8 mmol/L (ref 3.5–5.1)
Sodium: 138 mmol/L (ref 135–145)

## 2019-12-09 MED ORDER — DEXAMETHASONE 4 MG PO TABS
4.0000 mg | ORAL_TABLET | Freq: Every day | ORAL | Status: DC
  Administered 2019-12-09 – 2019-12-18 (×10): 4 mg via ORAL
  Filled 2019-12-09 (×10): qty 1

## 2019-12-09 NOTE — Progress Notes (Signed)
Patient ID: Hailey Hill, female   DOB: 1951/04/21, 69 y.o.   MRN: [id number]  This NP visited patient at the bedside as a follow up to last weeks GOCs meeting.  I met briefly with the patient at the bedside and she tells me that things are going well in rehab and the plan is for her to go home next week.  Her husband will be her main support person.  I shared with Ms. Ham the importance of her recognizing her  increased need for assistance with every day activities and encouraged her to ask for help.  Spoke with son/  Synthia Innocent by telephone for continued conversation regarding current medical situation, goals of care, treatment option decisions, advanced directive decisions and anticipatory care needs.  I voiced my concern for his father the main caregiver,  regarding increased need for support in the home  and caregiver burnout.  Emotional support offered  He appreciates the call but does not feel at this time that it would be beneficial to meet in person with patient, husband and himself.  I encouraged him to call me with questions or concerns and that I could also support them outpatient at the cancer center.  Discussed with son the importance of continued conversation with his family and the  medical providers regarding overall plan of care and treatment options,  ensuring decisions are within the context of the patients/his mother values and GOCs.  Questions and concerns addressed    Total time spent on the unit was 25 minutes  Greater than 50% of the time was spent in counseling and coordination of care  Wadie Lessen NP  Palliative Medicine Team Team Phone # [phone] Pager [phone]

## 2019-12-09 NOTE — Progress Notes (Signed)
Occupational Therapy Session Note  Patient Details  Name: Hailey Hill MRN: [id number] Date of Birth: 18-May-1951  Today's Date: 12/09/2019 OT Individual Time: 9563-8756 OT Individual Time Calculation (min): 55 min    Short Term Goals: Week 1:  OT Short Term Goal 1 (Week 1): Pt will stand to perform 2 grooming tasks at the sink with close supervision OT Short Term Goal 2 (Week 1): Pt will perform toileting wtih close supervision OT Short Term Goal 3 (Week 1): Pt will demonstrate daily recall of familar ADL tasks with min  A OT Short Term Goal 4 (Week 1): Pt will pick out clothing and sequencing donning clothing with only min cues  Skilled Therapeutic Interventions/Progress Updates:    Pt resting in recliner upon arrival and agreeable to therapy.  Pt declined bathing/dressing this morning. OT intervention with focus on functional amb with RW, sit<>stand, standing balance, safety awareness, and activity tolerance to increase independence with BADLs. Pt amb with RW to gym and initially engaged in sit<>stand tasks with/without BUE support, including squats, squats when reaching for cards on card board,sit<>stand without BUE support, standing balance without BUE support.  Pt amb to Day Room and engaged in two (2) games of [employer] Connect [employer] while standing.  Pt amb with RW back to room and remained in recliner with all needs within reach and belt alarm activated.   Therapy Documentation Precautions:  Precautions Precautions: Fall Precaution Comments: posterior bias; R inattention Restrictions Weight Bearing Restrictions: No Pain:  Pt denies pain this morning   Therapy/Group: Individual Therapy  [name] 12/09/2019, 9:04 AM

## 2019-12-09 NOTE — Progress Notes (Signed)
Physical Therapy Session Note  Patient Details  Name: Hailey Hill MRN: [id number] Date of Birth: Mar 25, 1951  Today's Date: 12/09/2019 PT Individual Time: 1430-1530 PT Individual Time Calculation (min): 60 min   Short Term Goals: Week 1:  PT Short Term Goal 1 (Week 1): Pt will perform bed mobility with supervision PT Short Term Goal 2 (Week 1): Pt will be able to perform functional transfers with CGA PT Short Term Goal 3 (Week 1): Pt will be able to gait x 150' with min assist PT Short Term Goal 4 (Week 1): Pt will be able to perform stairs with R rail with min assist  Skilled Therapeutic Interventions/Progress Updates:    Pt received seated in recliner in room asleep, arousable and agreeable to PT session. No complaints of pain. Stand pivot transfer recliner to w/c with RW and CGA, v/c for safe transfer technique. Manual w/c propulsion x 150 ft with use of BUE and BLE at Supervision level, cues for safety with steering as pt tends to deviate to the L. Standing balance with one UE support on RW and CGA while performing dynavision: reaching outside BOS and across midline for targets. Pt exhibits decreased reaction time when reaching to LUQ with RUE and into RUQ with LUE, LUE with decreased time as compared to RUE. Standing BLE coordination training with RW and CGA for balance: alt L/R 4" step-taps 2 x 10 reps, alt L/R cone taps with decreased coordination on RLE as compared to LLE. Discussed pt's frequent falls at home, injuries, implications, and safety. Pt reports she feels least steady when turning as she tends to rush. Ambulation through cones with use of RW and CGA for balance with focus on safe RW management and increased safety with turns. Pt demos good balance overall with one near LOB requiring min A to correct due to RW catching on the floor. Discussed decreased BUE on reliance and to not push down through RW during gait. Pt requests to return to bed at end of session. Stand pivot transfer  back to bed with RW and CGA. Sit to supine Supervision. Pt left semi-reclined in bed with needs in reach, bed alarm in place at end of session.  Therapy Documentation Precautions:  Precautions Precautions: Fall Precaution Comments: posterior bias; R inattention Restrictions Weight Bearing Restrictions: No    Therapy/Group: Individual Therapy   Excell Seltzer, PT, DPT  12/09/2019, 3:34 PM

## 2019-12-09 NOTE — Progress Notes (Signed)
Speech Language Pathology Daily Session Note  Patient Details  Name: Hailey Hill MRN: [id number] Date of Birth: Jul 12, 1951  Today's Date: 12/09/2019 SLP Individual Time: 1030-1100 SLP Individual Time Calculation (min): 30 min  Short Term Goals: Week 1: SLP Short Term Goal 1 (Week 1): Pt will demonstrate selective attention for 5 minutes with Min A cues. SLP Short Term Goal 2 (Week 1): Pt will complete complex problem solving tasks with Min A cues. SLP Short Term Goal 3 (Week 1): Pt will demonstrate intellectual awareness by identifying 2 physical and 2 cognitive deficits with Min A cues. SLP Short Term Goal 4 (Week 1): Given Min A cues, pt will utilize compensatory memory strategies to recall daily information in 7 out of 10 opportunities.  Skilled Therapeutic Interventions:  Skilled treatment session targeted cognition goals. SLP facilitated session by teaching pt novel card game [facility]). SLP demonstrated game and pt began verbalizing concepts of game. Pt stated that she felt the game was very simple and questioned "what's the point?" This writer explained that memory, attention and problem solving were being targeted in game. SLP handed pt the cards and pt required Max A cues for recall of card placement and problem solving. Initially pt frustrated "you didn't show me that." SLP continued providing cues and cues were reduced to Min A after 2 rounds of game. Pt with decreased mental flexibility as an additional ST session at 11:30 was not written on her schedule. She tended to ruminate on this concept. Pt left with instruction to show SLP at 11:30 how to play card game. Pt left upright in recliner, lap belt on and all needs within reach. Continue per current plan of care.      Pain    Therapy/Group: Individual Therapy  Livia Tarr 12/09/2019, 11:48 AM

## 2019-12-09 NOTE — Progress Notes (Signed)
Occupational Therapy Session Note  Patient Details  Name: Hailey Hill MRN: [id number] Date of Birth: 1951-02-20  Today's Date: 12/09/2019 OT Individual Time: 1300-1330 OT Individual Time Calculation (min): 30 min    Short Term Goals: Week 1:  OT Short Term Goal 1 (Week 1): Pt will stand to perform 2 grooming tasks at the sink with close supervision OT Short Term Goal 2 (Week 1): Pt will perform toileting wtih close supervision OT Short Term Goal 3 (Week 1): Pt will demonstrate daily recall of familar ADL tasks with min  A OT Short Term Goal 4 (Week 1): Pt will pick out clothing and sequencing donning clothing with only min cues  Skilled Therapeutic Interventions/Progress Updates:    Pt resting in recliner upon arrival and agreeable to therapy.  Pt requested to use toilet.  Pt amb with RW (CGA) and completed toileting tasks with CGA.  Pt amb with RW (CGA) to small gym and engaged in standing tasks at [employer].  Focus on standing balance when reaching outside BOS. Pt with LOB X 4 during session and required min A to correct.  Pt engaged in standing tasks at Dyn[employer]ision-all red lights, random red/green lights with challenge of using RUE for red and LUE for green, and reaching across midline to touch lights.  Pt completed total of 8 tirals.  LOB X 2 during task.  Pt becomes frustrated when therapist assists with maintaining standing balance.  Pt with slow reaction to LOB and decreased awareness. Pt returned to room and remained in recliner with all needs within reach and belt alarm activated.   Therapy Documentation Precautions:  Precautions Precautions: Fall Precaution Comments: posterior bias; R inattention Restrictions Weight Bearing Restrictions: No Pain:  Pt denies pain this afternoon   Therapy/Group: Individual Therapy  [name] 12/09/2019, 2:46 PM
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
## 2019-12-09 NOTE — Progress Notes (Signed)
Speech Language Pathology Daily Session Note  Patient Details  Name: Hailey Hill MRN: [id number] Date of Birth: 06/30/1951  Today's Date: 12/09/2019 SLP Individual Time: 1130-1155 SLP Individual Time Calculation (min): 25 min  Short Term Goals: Week 1: SLP Short Term Goal 1 (Week 1): Pt will demonstrate selective attention for 5 minutes with Min A cues. SLP Short Term Goal 2 (Week 1): Pt will complete complex problem solving tasks with Min A cues. SLP Short Term Goal 3 (Week 1): Pt will demonstrate intellectual awareness by identifying 2 physical and 2 cognitive deficits with Min A cues. SLP Short Term Goal 4 (Week 1): Given Min A cues, pt will utilize compensatory memory strategies to recall daily information in 7 out of 10 opportunities.  Skilled Therapeutic Interventions: Skilled treatment session focused on cognitive goals. SLP facilitated session by providing Min A verbal cues of recall of procedures to a previously learned task (initial SLP session at 1030). Patient demonstrated selective attention to task in a mildly distracting environment for ~20 minutes with supervision level verbal cues. Patient left upright in recliner with alarm on and all needs within reach. Continue with current plan of care.      Pain No/Denies Pain   Therapy/Group: Individual Therapy  Haddie Bruhl 12/09/2019, 3:12 PM

## 2019-12-09 NOTE — Progress Notes (Signed)
[facility] PHYSICAL MEDICINE & REHABILITATION PROGRESS NOTE   Subjective/Complaints:   Pt reports she's ready to go home- doing well, but needs to go home ASAP- says she's scheduled to go home tomorrow. I didn't remember that, so will look into it.  Can't find any signs that's she scheduled yet to go home.     ROS: denies SOB, CP, abd pain, HA, vision changes, N/V/D/C.  Objective:   No results found. Recent Labs    12/09/19 0545  WBC 11.5*  HGB 14.1  HCT 41.6  PLT 266   Recent Labs    12/09/19 0545  NA 138  K 3.8  CL 102  CO2 26  GLUCOSE 93  BUN 18  CREATININE 0.81  CALCIUM 9.1    Intake/Output Summary (Last 24 hours) at 12/09/2019 0854 Last data filed at 12/09/2019 3532 Gross per 24 hour  Intake 702 ml  Output --  Net 702 ml     Physical Exam: Vital Signs Blood pressure 91/65, pulse 66, temperature 98.1 F (36.7 C), temperature source Oral, resp. rate 18, height 5\' 8"  (1.727 m), weight 73.9 kg, SpO2 100 %.  Physical Exam  Vitals reviewed. Constitutional: She appears well-developed and well-nourished. Sleeping comfortably, easily arousable.  Awake, alert, appropriate, unusual affect; wearing winter hat in bed again this AM; perseverating on fact she's positive is going home tomorrow; NAD HENT:  Head: Normocephalic and atraumatic.  Eyes: EOM are normal.  Neck: No tracheal deviation present. No thyromegaly present.  CV: RRR Respiratory: Effort normal. No stridor. No respiratory distress. CTA B/L GI: Soft. She exhibits no distension. (+)BS Musculoskeletal:     Comments: No edema or tenderness in extremities  Neurological: she is alert Dysarthria Motor: Grossly 5/5 throughout Minimal ataxia right upper extremity  Skin: Skin is warm.  Psychiatric: unusual affect    Assessment/Plan: 1. 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  Physiatrist is providing close team supervision and 24 hour management of active medical problems listed below.  Physiatrist and rehab team continue to assess barriers to discharge/monitor patient progress toward functional and medical goals  Care Tool:  Bathing  Bathing activity did not occur: Safety/medical concerns Body parts bathed by patient: Left arm, Right arm, Chest, Abdomen, Front perineal area, Buttocks, Right upper leg, Left upper leg, Face   Body parts bathed by helper: Right lower leg, Left lower leg     Bathing assist Assist Level: Minimal Assistance - Patient > 75%     Upper Body Dressing/Undressing Upper body dressing   What is the patient wearing?: Pull over shirt    Upper body assist Assist Level: Minimal Assistance - Patient > 75%    Lower Body Dressing/Undressing Lower body dressing      What is the patient wearing?: Incontinence brief     Lower body assist Assist for lower body dressing: Minimal Assistance - Patient > 75%     Toileting Toileting    Toileting assist Assist for toileting: Moderate Assistance - Patient 50 - 74%     Transfers Chair/bed transfer  Transfers assist  Chair/bed transfer activity did not occur: Safety/medical concerns  Chair/bed transfer assist level: Minimal Assistance - Patient > 75%     Locomotion Ambulation   Ambulation assist      Assist level: Minimal Assistance - Patient > 75% Assistive device: Walker-rolling  Max distance: 150   Walk 10 feet activity   Assist     Assist level: Minimal Assistance - Patient > 75% Assistive device: Walker-rolling   Walk 50 feet activity   Assist Walk 50 feet with 2 turns activity did not occur: Safety/medical concerns  Assist level: Minimal Assistance - Patient > 75% Assistive device: Walker-rolling    Walk 150 feet activity   Assist Walk 150 feet activity did not occur:  Safety/medical concerns  Assist level: Minimal Assistance - Patient > 75% Assistive device: Walker-rolling    Walk 10 feet on uneven surface  activity   Assist Walk 10 feet on uneven surfaces activity did not occur: Safety/medical concerns         Wheelchair     Assist Will patient use wheelchair at discharge?: No Type of Wheelchair: Manual    Wheelchair assist level: Supervision/Verbal cueing Max wheelchair distance: 150'    Wheelchair 50 feet with 2 turns activity    Assist        Assist Level: Supervision/Verbal cueing   Wheelchair 150 feet activity     Assist      Assist Level: Supervision/Verbal cueing   Blood pressure 91/65, pulse 66, temperature 98.1 F (36.7 C), temperature source Oral, resp. rate 18, height 5\' 8"  (1.727 m), weight 73.9 kg, SpO2 100 %.  Medical Problem List and Plan: 1. 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 .              -patient may shower             -ELOS/Goals:-13 days/supervision/min A             Continue CIR therapies.   1/30: Patient asks questions about why she was taken off her seizure medications prior to this admission. Explained that since she was seizure free prior to admission her physician likely felt the risks of continued seizure medication outweighed the benefits. Asks the difference between seizure and stroke and I explained to her.  2.  Antithrombotics: -DVT/anticoagulation:  Will add SQ lovenox as 7 days out from bleed and high risk for DVTs             -antiplatelet therapy: N/A 3. Pain Management: tylenol prn.  1/31: denies pain  4. Mood: LCSW to follow for evaluation and support.              -antipsychotic agents: N/a 5. Neuropsych: This patient is not fully capable of making decisions on her own  behalf. 6. Skin/Wound Care: Routine pressure relief measures.  7. Fluids/Electrolytes/Nutrition:  Monitor I/O. 8. 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 Cont 5 mg daily until Neuro/Onc follow up.  2/1- will double check on Decadron dosing- since appears it was stopped-will restart at 4 mg PO daily for now, since 5 mg is unusual dose.  9. Acute  AKI: Encourage fluid intake to help resolve dehydration.  1/28- BUN is 24 and Cr is 0.94 10. Leucocytosis: Monitor for signs of infection. Will order UCS to monitor for efficacy of treatment.             1/28- WBC down to 11.4 from 12.3- f/u U/A negative- likely from decadron.   2/1- WBC 11.5- U/A was negative; no other signs of infection, will monitor  LOS: 5 days A FACE TO FACE EVALUATION WAS PERFORMED  Derisha Funderburke 12/09/2019, 8:54 AM

## 2019-12-10 ENCOUNTER — Inpatient Hospital Stay (HOSPITAL_COMMUNITY): Payer: Medicare HMO | Admitting: Physical Therapy

## 2019-12-10 ENCOUNTER — Inpatient Hospital Stay (HOSPITAL_COMMUNITY): Payer: Medicare HMO

## 2019-12-10 ENCOUNTER — Inpatient Hospital Stay (HOSPITAL_COMMUNITY): Payer: Medicare HMO | Admitting: Speech Pathology

## 2019-12-10 NOTE — Patient Care Conference (Signed)
Inpatient RehabilitationTeam Conference and Plan of Care Update Date: 12/10/2019   Time: 11:15 AM    Patient Name: Hailey Hill      Medical Record Number: [id number]  Date of Birth: Oct 15, 1951 Sex: Female         Room/Bed: 4M07C/4M07C-01 Payor Info: Payor: [employer] MEDICARE / Plan: [employer] HMO / Product Type: *No Product type* /    Admit Date/Time:  12/04/2019  3:55 PM  Primary Diagnosis:  Lung cancer metastatic to brain [facility])  Patient Active Problem List   Diagnosis Date Noted  . Adjustment disorder with anxious mood   . Palliative care by specialist   . DNR (do not resuscitate) discussion   . Lung cancer metastatic to brain ([place]) 12/04/2019  . Closed fracture of temporal bone ([place])   . Leukocytosis   . AKI (acute kidney injury) ([place])   . Intracranial hemorrhage, nontraumatic ([place]) 11/27/2019  . Acute lower UTI 11/27/2019  . Fall 07/10/2019  . Closed fracture of head of left humerus   . Metastatic lung cancer (metastasis from lung to other site) ([place]) 07/07/2019  . Fracture, humerus, anatomical neck, left, closed, initial encounter 07/07/2019  . Metastatic cancer to brain ([place]) 07/07/2019  . Chest pain with moderate risk for cardiac etiology 12/14/2015  . Small cell lung cancer ([place]) 09/16/2013    Expected Discharge Date: Expected Discharge Date: 12/18/19  Team Members Present: Physician leading conference: Dr. Courtney Heys Social Worker Present: Lennart Pall, [place], [place]) Nurse Present: Judee Clara, LPN Case Manager: Karene Fry, RN PT Present: Excell Seltzer, PT OT Present: Willeen Cass, OT;Roanna Epley, COTA SLP Present: Stormy Fabian, SLP PPS Coordinator present : Gunnar Fusi, Novella Olive, PT     Current Status/Progress Goal Weekly Team Focus  Bowel/Bladder   continent of bowel/bladder  continue to be free of constipation  continue to monitor and treat for s/s of constipation   Swallow/Nutrition/ Hydration             ADL's   bathing-min A; LB dressing-min A; toileting-min A; functional transfers-min A; decreased safety awareness  supervision overall  safety awareness; BADL training, functional tranfsers   Mobility   Supervision bed mobility, CGA transfers, min A gait up to 150 ft with RW, min A stairs  Supervision to CGA overall at ambulatory level  dynamic standing balance, safety awareness   Communication             Safety/Cognition/ Behavioral Observations  Min A for higher level cognition  Supervision  selective attention, memory, awareness, problem solving   Pain   no c/o pain  continue to be free of pain  continue to monitor and treat for s/s of pain   Skin   skin is warm, dry, and intact  skin integrity will remain intact without issue  continue to monitor skin intregrity and treat as needed    Rehab Goals Patient on target to meet rehab goals: Yes *See Care Plan and progress notes for long and short-term goals.     Barriers to Discharge  Current Status/Progress Possible Resolutions Date Resolved   Nursing                  PT  Decreased caregiver support;Behavior;Pending chemo/radiation                 OT                  SLP  SW                Discharge Planning/Teaching Needs:  Plan to discharge to home with her husband who will provide 24/7 care  Family education per protocol   Team Discussion: Cerebellar mets, small cell lung CA, fall, ICh, LOC, progressive mets, on decadron, will call husband today.  RN forgetful, incontinent at times.  OT has inc pads from home, S goals, min/CGA ADLs, min/CGA transfers, has LOB, not correcting, needs S at home.  PT CGA/min A walker, decreased safety awareness, working on balance, husband works, not helping PTA, needs fam ed.  SLP emotional, is alone all day, has a son, has S cognition goals.   Revisions to Treatment Plan: N/A     Medical Summary Current Status: incontnence of bowel x1; otherwise OK; no wounds; no pain complaints;  was incontinent at home prior- not aware she was Weekly Focus/Goal: CGA_ min assist with RW_ focusing on safety awareness- dynamic standing balance. Per pt, husband has nothing to do with her- "Cancer cooties" and don't share spaces.  Barriers to Discharge: Behavior;Pending chemo/radiation;Decreased family/caregiver support;Home enviroment access/layout;Incontinence;Other (comments)  Barriers to Discharge Comments: dispo issues. cognition decreased due to dx Possible Resolutions to Barriers: goals Superivison- currently Min assist/CGA for all; loss of balance a LOT; doesn't compensate for LOB; poor memory; poor safety awareness   Continued Need for Acute Rehabilitation Level of Care: The patient requires daily medical management by a physician with specialized training in physical medicine and rehabilitation for the following reasons: Direction of a multidisciplinary physical rehabilitation program to maximize functional independence : Yes Medical management of patient stability for increased activity during participation in an intensive rehabilitation regime.: Yes Analysis of laboratory values and/or radiology reports with any subsequent need for medication adjustment and/or medical intervention. : Yes   I attest that I was present, lead the team conference, and concur with the assessment and plan of the team.   Jodell Cipro M 12/12/2019, 10:01 AM   Team conference was held via web/ teleconference due to [place] - 19

## 2019-12-10 NOTE — Progress Notes (Signed)
Pt slept well throughout shift. No c/o pain.  No prn medication requested. Pt assisted to restroom x1 at beginning of shift. Repositioned self throughout the night. No s/s of distress. Bed in lowest position with call bell within reach. Bed alarm on and functioning properly.

## 2019-12-10 NOTE — Progress Notes (Signed)
Occupational Therapy Session Note  Patient Details  Name: Hailey Hill MRN: [id number] Date of Birth: 09-16-1951  Today's Date: 12/10/2019 OT Individual Time: 0915-1000 OT Individual Time Calculation (min): 45 min  and Today's Date: 12/10/2019 OT Missed Time: 15 Minutes Missed Time Reason: Other (comment)(eating breakfast)   Short Term Goals: Week 1:  OT Short Term Goal 1 (Week 1): Pt will stand to perform 2 grooming tasks at the sink with close supervision OT Short Term Goal 2 (Week 1): Pt will perform toileting wtih close supervision OT Short Term Goal 3 (Week 1): Pt will demonstrate daily recall of familar ADL tasks with min  A OT Short Term Goal 4 (Week 1): Pt will pick out clothing and sequencing donning clothing with only min cues  Skilled Therapeutic Interventions/Progress Updates:    Pt resting in bed upon arrival.  Pt stated she was still waiting for the remainder of her breakfast.  Breakfast arrived at beginning of session. Pt missed 15 mins skilled OT services to eat breakfast.  Pt amb with RW to tub room and practiced TTB transfers with CGA for balance. Pt continued to gym and engaged in standing activities with focus on balance with positional changes. Pt with LOB X 4 requiring min A for correction.  Pt comments that she is aware of LOB and can correct them.  Pt continues to require assistance with LOB. Pt requires CGA for ambulation and when standing for activities. Pt returned to room and requested to lay back into bed to rest before next therapy. Pt remained in bed with all needs within reach and bed alarm activated.   Therapy Documentation Precautions:  Precautions Precautions: Fall Precaution Comments: posterior bias; R inattention Restrictions Weight Bearing Restrictions: No General: General OT Amount of Missed Time: 15 Minutes Pain: Pain Assessment Pain Scale: 0-10 Pain Score: 0-No pain Pain Intervention(s): Refused(pt repositions self)   Therapy/Group:  Individual Therapy  [name] 12/10/2019, 10:02 AM

## 2019-12-10 NOTE — Progress Notes (Signed)
Physical Therapy Session Note  Patient Details  Name: Hailey Hill MRN: [id number] Date of Birth: [birth year]  Today's Date: 12/10/2019 PT Individual Time: 1415-1525 PT Individual Time Calculation (min): 70 min   Short Term Goals: Week 1:  PT Short Term Goal 1 (Week 1): Pt will perform bed mobility with supervision PT Short Term Goal 2 (Week 1): Pt will be able to perform functional transfers with CGA PT Short Term Goal 3 (Week 1): Pt will be able to gait x 150' with min assist PT Short Term Goal 4 (Week 1): Pt will be able to perform stairs with R rail with min assist  Skilled Therapeutic Interventions/Progress Updates:   Pt in recliner and agreeable to therapy, no c/o pain. Pt ambulated to/from therapy gym w/ CGA using RW and slow gait speed, ~150' each way. Needed min assist for 1-2 LOB posteriorly. Verbal reminders to attend to task and limit distraction. Worked on sit<>stands in therapy gym w/ emphasis on technique and form. Min assist sit<>stand from mat w/o UE support, pt would often lose balance and fall posteriorly back into mat table. Verbal, visual, and tactile cues for "nose-over-toes", anterior trunk lean, and sequencing of task. Once pt was CGA-close supervision on firm surface, added in slight wedge under toes for increased DF to force pt into increased relative anterior weight shifting in stance. Needed min-mod assist, faded to CGA w/ 10+ reps and pt able to stabilize in stance w/ CGA. Pt's technique most improved w/ mirror for visual feedback. Additionally performed 5 partial knee bends w/o UE support on foam wedge x3 reps w/ CGA-min assist to work on eccentric BLE strengthening. Good carryover to sit<>stands w/o mirror and w/o wedge. Performed w/ close supervision-CGA from mat prior to heading back to room. Pt performed simple pipe tree puzzle tasks during seated rest breaks, min verbal cues for sequencing and problem solving. Ambulated back to room and performed toilet transfer w/  CGA. Ended session in supine, all needs in reach. Pt perseverative at times on husband not being able to assist her at home. Per chart review, pt w/ goals of 24/7 supervision-CGA. Educated her on importance of this as therapist needed to prevent her from falling multiple times this session. Pt verbalized understanding however w/ little anticipatory awareness of how ambulating w/o assist at home would be unsafe. Will continue to educate pt and promote safe d/c w/ 24/7 supervision-CGA.   Therapy Documentation Precautions:  Precautions Precautions: Fall Precaution Comments: posterior bias; R inattention Restrictions Weight Bearing Restrictions: No  Therapy/Group: Individual Therapy  [name] Clent Demark 12/10/2019, 6:23 PM

## 2019-12-10 NOTE — Progress Notes (Signed)
Speech Language Pathology Daily Session Note  Patient Details  Name: WINDSOR ZIRKELBACH MRN: [id number] Date of Birth: 03-Dec-1950  Today's Date: 12/10/2019 SLP Individual Time: 0700-0730 SLP Individual Time Calculation (min): 30 min  Short Term Goals: Week 1: SLP Short Term Goal 1 (Week 1): Pt will demonstrate selective attention for 5 minutes with Min A cues. SLP Short Term Goal 2 (Week 1): Pt will complete complex problem solving tasks with Min A cues. SLP Short Term Goal 3 (Week 1): Pt will demonstrate intellectual awareness by identifying 2 physical and 2 cognitive deficits with Min A cues. SLP Short Term Goal 4 (Week 1): Given Min A cues, pt will utilize compensatory memory strategies to recall daily information in 7 out of 10 opportunities.  Skilled Therapeutic Interventions:  Skilled treatment session targeted cognition goals. SLP facilitated recall ability by having pt demonstrate previously learned card game. Pt able to demonstrate with Mod I but lacks executive function in that she continued to play the game after it was completed without realizing it. SLP further targeted problem solving and selective attention during semi-complex novel game (Call-it). Pt required Min A cues for initial problem solving and was Min A for recall with Moderate cues needed for selective attention. Pt left upright in bed, bed alarm on and all needs within reach. Continue per current plan of care.      Pain Pain Assessment Pain Scale: 0-10 Pain Score: 0-No pain Pain Intervention(s): Refused(pt repositions self)  Therapy/Group: Individual Therapy  Seville Downs 12/10/2019, 10:22 AM

## 2019-12-10 NOTE — Progress Notes (Signed)
Physical Therapy Session Note  Patient Details  Name: Hailey Hill MRN: [id number] Date of Birth: January 17, 1951  Today's Date: 12/10/2019 PT Individual Time: 1130-1200 PT Individual Time Calculation (min): 30 min   Short Term Goals: Week 1:  PT Short Term Goal 1 (Week 1): Pt will perform bed mobility with supervision PT Short Term Goal 2 (Week 1): Pt will be able to perform functional transfers with CGA PT Short Term Goal 3 (Week 1): Pt will be able to gait x 150' with min assist PT Short Term Goal 4 (Week 1): Pt will be able to perform stairs with R rail with min assist  Skilled Therapeutic Interventions/Progress Updates:    Pt received seated EOB in care of NT assisting pt to bathroom. This therapist takes over for NT. Pt is CGA for ambulation into bathroom with RW. Pt performs toilet transfer with CGA and use of RW and grab bar. Ambulation 2 x 150 ft with RW and CGA overall for balance, one instance of near LOB when distracted requiring min A to recover. Ambulation through cones with RW and CGA overall with focus on slow, controlled turns and safe RW management. Standing alt L/R cone taps with RW and CGA for balance for BLE coordination training. Pt becomes emotional and tearful during session discussing her grandchildren and her friends from church, provided emotional support. Pt left seated in recliner in room with needs in reach, quick release belt and chair alarm in place at end of session.  Therapy Documentation Precautions:  Precautions Precautions: Fall Precaution Comments: posterior bias; R inattention Restrictions Weight Bearing Restrictions: No     Therapy/Group: Individual Therapy   Excell Seltzer, PT, DPT  12/10/2019, 12:55 PM

## 2019-12-10 NOTE — Progress Notes (Signed)
Social Work Patient ID: Hailey Hill, female   DOB: Nov 23, 1950, 69 y.o.   MRN: [id number]    SW met with pt in room, and had conference call with pt son [name] [phone]) and pt husband [name] to provide updates from team, and anticipated discharge date 12/18/19. Pt husband and son to discuss plan of care for pt going forward. SW also waiting on follow-up about family education and if the husband can come in for family education on Saturday 10am-2pm versus Friday and Tuesday afternoon between 3pm-4pm.   [name], MSW, Social Worker Office (preferred): [phone] Cell: [phone] Fax: [phone]

## 2019-12-10 NOTE — Progress Notes (Signed)
[facility] PHYSICAL MEDICINE & REHABILITATION PROGRESS NOTE   Subjective/Complaints:  Pt reports still not sleeping- declines to receive any sleeping medicines of any kind.   Asked me to call husband. Doing well- waiting for TOM- OT- to work on therapy issues.  Admits has a deep fear of falling- so that's why not progressing as could.    ROS: denies SOB, CP, abd pain, HA, vision changes, N/V/D/C.  Objective:   No results found. Recent Labs    12/09/19 0545  WBC 11.5*  HGB 14.1  HCT 41.6  PLT 266   Recent Labs    12/09/19 0545  NA 138  K 3.8  CL 102  CO2 26  GLUCOSE 93  BUN 18  CREATININE 0.81  CALCIUM 9.1    Intake/Output Summary (Last 24 hours) at 12/10/2019 0854 Last data filed at 12/10/2019 0843 Gross per 24 hour  Intake 418 ml  Output --  Net 418 ml     Physical Exam: Vital Signs Blood pressure 110/68, pulse 61, temperature 97.9 F (36.6 C), temperature source Oral, resp. rate 17, height 5\' 8"  (1.727 m), weight 73.9 kg, SpO2 98 %.  Physical Exam  Vitals reviewed. Constitutional: Sleeping comfortably, easily arousable.  Awake, alert, appropriate, unusual affect; wearing winter hat in bed again this AM; perseverating on appointment with Dr Sima Matas yesterday; ; NAD HENT:  Head: Normocephalic and atraumatic.  Eyes: EOM are normal.  Neck: No tracheal deviation present. No thyromegaly present.  CV: RRR Respiratory: Effort normal. No stridor. No respiratory distress. CTA B/L GI: Soft. She exhibits no distension. (+)BS Musculoskeletal:     Comments: No edema or tenderness in extremities  Neurological: she is alert Dysarthria Motor: Grossly 5/5 throughout Minimal ataxia right upper extremity  Skin: Skin is warm.  Psychiatric: unusual affect; impaired memory; slow to respond.     Assessment/Plan: 1. 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  Physiatrist is providing close team supervision and 24 hour management of active medical problems listed below.  Physiatrist and rehab team continue to assess barriers to discharge/monitor patient progress toward functional and medical goals  Care Tool:  Bathing  Bathing activity did not occur: Safety/medical concerns Body parts bathed by patient: Left arm, Right arm, Chest, Abdomen, Front perineal area, Buttocks, Right upper leg, Left upper leg, Face   Body parts bathed by helper: Right lower leg, Left lower leg     Bathing assist Assist Level: Minimal Assistance - Patient > 75%     Upper Body Dressing/Undressing Upper body dressing   What is the patient wearing?: Pull over shirt    Upper body assist Assist Level: Minimal Assistance - Patient > 75%    Lower Body Dressing/Undressing Lower body dressing      What is the patient wearing?: Incontinence brief     Lower body assist Assist for lower body dressing: Minimal Assistance - Patient > 75%     Toileting Toileting    Toileting assist Assist for toileting: Moderate Assistance - Patient 50 - 74%     Transfers Chair/bed transfer  Transfers assist  Chair/bed transfer activity did not occur: Safety/medical concerns  Chair/bed transfer assist level: Contact Guard/Touching assist     Locomotion Ambulation   Ambulation assist      Assist level: Minimal Assistance - Patient > 75% Assistive device: Walker-rolling Max  distance: 150   Walk 10 feet activity   Assist     Assist level: Minimal Assistance - Patient > 75% Assistive device: Walker-rolling   Walk 50 feet activity   Assist Walk 50 feet with 2 turns activity did not occur: Safety/medical concerns  Assist level: Minimal Assistance - Patient > 75% Assistive device: Walker-rolling    Walk 150 feet activity   Assist Walk 150 feet activity did  not occur: Safety/medical concerns  Assist level: Minimal Assistance - Patient > 75% Assistive device: Walker-rolling    Walk 10 feet on uneven surface  activity   Assist Walk 10 feet on uneven surfaces activity did not occur: Safety/medical concerns         Wheelchair     Assist Will patient use wheelchair at discharge?: No Type of Wheelchair: Manual    Wheelchair assist level: Supervision/Verbal cueing Max wheelchair distance: 150'    Wheelchair 50 feet with 2 turns activity    Assist        Assist Level: Supervision/Verbal cueing   Wheelchair 150 feet activity     Assist      Assist Level: Supervision/Verbal cueing   Blood pressure 110/68, pulse 61, temperature 97.9 F (36.6 C), temperature source Oral, resp. rate 17, height 5\' 8"  (1.727 m), weight 73.9 kg, SpO2 98 %.  Medical Problem List and Plan: 1. 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 .              -patient may shower             -ELOS/Goals:-13 days/supervision/min A             Continue CIR therapies.   1/30: Patient asks questions about why she was taken off her seizure medications prior to this admission. Explained that since she was seizure free prior to admission her physician likely felt the risks of continued seizure medication outweighed the benefits. Asks the difference between seizure and stroke and I explained to her.  2.  Antithrombotics: -DVT/anticoagulation:  Will add SQ lovenox as 7 days out from bleed and high risk for DVTs             -antiplatelet therapy: N/A 3. Pain Management: tylenol prn.  1/31: denies pain  4. Mood: LCSW to follow for evaluation and support.              -antipsychotic agents: N/a 5. Neuropsych: This patient is not fully capable of making decisions on  her own behalf. 6. Skin/Wound Care: Routine pressure relief measures.  7. Fluids/Electrolytes/Nutrition:  Monitor I/O. 8. 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 Cont 5 mg daily until Neuro/Onc follow up.  2/1- will double check on Decadron dosing- since appears it was stopped-will restart at 4 mg PO daily for now, since 5 mg is unusual dose.  9. Acute  AKI: Encourage fluid intake to help resolve dehydration.  1/28- BUN is 24 and Cr is 0.94 10. Leucocytosis: Monitor for signs of infection. Will order UCS to monitor for efficacy of treatment.             1/28- WBC down to 11.4 from 12.3- f/u U/A negative- likely from decadron.   2/1- WBC 11.5- U/A was negative; no other signs of infection, will monitor 11. Dispo  2/2-  will call Husband today to let him know how pt's doing after team conference.     LOS: 6 days A FACE TO FACE EVALUATION WAS PERFORMED  Adaya Garmany 12/10/2019, 8:54 AM

## 2019-12-11 ENCOUNTER — Inpatient Hospital Stay (HOSPITAL_COMMUNITY): Payer: Medicare HMO

## 2019-12-11 ENCOUNTER — Inpatient Hospital Stay (HOSPITAL_COMMUNITY): Payer: Medicare HMO | Admitting: Physical Therapy

## 2019-12-11 ENCOUNTER — Encounter (HOSPITAL_COMMUNITY): Payer: Medicare HMO | Admitting: Psychology

## 2019-12-11 ENCOUNTER — Inpatient Hospital Stay (HOSPITAL_COMMUNITY): Payer: Medicare HMO | Admitting: Speech Pathology

## 2019-12-11 DIAGNOSIS — I629 Nontraumatic intracranial hemorrhage, unspecified: Secondary | ICD-10-CM

## 2019-12-11 NOTE — Progress Notes (Signed)
Physical Therapy Session Note  Patient Details  Name: Hailey Hill MRN: [id number] Date of Birth: [birth year]  Today's Date: 12/11/2019 PT Individual Time: 1530-1630 PT Individual Time Calculation (min): 60 min   Short Term Goals: Week 1:  PT Short Term Goal 1 (Week 1): Pt will perform bed mobility with supervision PT Short Term Goal 2 (Week 1): Pt will be able to perform functional transfers with CGA PT Short Term Goal 3 (Week 1): Pt will be able to gait x 150' with min assist PT Short Term Goal 4 (Week 1): Pt will be able to perform stairs with R rail with min assist  Skilled Therapeutic Interventions/Progress Updates:   Pt received supine in bed and agreeable to PT. Supine>sit transfer with supervision assist for safety.   Gait training with RW to and from rehab gym with CGA-supervision A x 118f each direction.  Noted change in gait speed when talking or distracted by obstacles in path.   Dynamic gait training without AD 2 x 63fand min assist. Weave through cones 2 x 10 and CGA intermittently to prevent posterior LOB. Min cues for attention to task and increased step length/width on the RLE to prevent lateral/posterior LOB.   Dynamic balance training while standing on airex pad to complete moderate difficulty peg board puzzles with 1 UE support on table. Min assist throughout with multimodal cues for awareness of lateral/anterior LOB with cues for use of ankle strategy to correct.   Nustep reciprocal movement training and sustained attention task BUE/BLE, level 5, x 8 minutes min cues for awareness of speed and for full ROM BLE. Pt able to continue symmetrical movement when engaged in conversation, but unable to recall set stop time throughout training.   Pt returned to room and performed ambulatory transfer to bed with CGA and RW. Sit>supine completed with supervision assist, and left supine in bed with call bell in reach and all needs met.         Therapy  Documentation Precautions:  Precautions Precautions: Fall Precaution Comments: posterior Hill; R inattention Restrictions Weight Bearing Restrictions: No Vital Signs: Therapy Vitals Temp: 98.2 F (36.8 C) Temp Source: Oral Pulse Rate: 67 Resp: 17 BP: 106/69 Patient Position (if appropriate): Sitting Oxygen Therapy SpO2: 97 % O2 Device: Room Air Pain: denies   Therapy/Group: Individual Therapy  AuLorie Phenix/01/2020, 4:39 PM

## 2019-12-11 NOTE — Progress Notes (Signed)
Occupational Therapy Session Note  Patient Details  Name: GALA PADOVANO MRN: [id number] Date of Birth: 02/12/1951  Today's Date: 12/11/2019 OT Individual Time: 1300-1330 OT Individual Time Calculation (min): 30 min    Short Term Goals: Week 2:  OT Short Term Goal 1 (Week 2): STG=LTG secondary to ELOS  Skilled Therapeutic Interventions/Progress Updates:    OT intervention with focus on functional amb with RW, standing balance, safety awareness, and activity tolerance to increase independence with BADLs. Pt amb with RW to gym and initially engaged in tossing ball against mini trampoline while seated.  Pt with reaggregated BUE movement to catch ball and required min A to maintain sitting balance.  Task not attempted in standing.  Pt stood at basketball rim and retrieved clothes pins to hang on net with no LOB. Pt also gathered towels from various surface heights and returned to dirty linen bag.  Pt returned to room and remained seated in recliner with all needs within reach and belt alarm activated.  No LOB in standing during this session.   Therapy Documentation Precautions:  Precautions Precautions: Fall Precaution Comments: posterior bias; R inattention Restrictions Weight Bearing Restrictions: No Pain:  Pt denies pain  Therapy/Group: Individual Therapy  [name] 12/11/2019, 2:40 PM

## 2019-12-11 NOTE — Progress Notes (Signed)
[facility] PHYSICAL MEDICINE & REHABILITATION PROGRESS NOTE   Subjective/Complaints:  Ate breakfast; waiting for PT. Explained called her family yesterday and discussed d/c planning.    ROS: denies SOB, CP, abd pain, HA, vision changes, N/V/D/C.  Objective:   No results found. Recent Labs    12/09/19 0545  WBC 11.5*  HGB 14.1  HCT 41.6  PLT 266   Recent Labs    12/09/19 0545  NA 138  K 3.8  CL 102  CO2 26  GLUCOSE 93  BUN 18  CREATININE 0.81  CALCIUM 9.1    Intake/Output Summary (Last 24 hours) at 12/11/2019 0840 Last data filed at 12/10/2019 1850 Gross per 24 hour  Intake 236 ml  Output --  Net 236 ml     Physical Exam: Vital Signs Blood pressure (!) 98/57, pulse (!) 57, temperature 98.1 F (36.7 C), resp. rate 16, height 5\' 8"  (1.727 m), weight 73.9 kg, SpO2 100 %.  Physical Exam  Vitals reviewed. Constitutional: Sleeping comfortably, easily arousable.  Awake, alert, appropriate, unusual affect; wearing winter hat in bed again this AM;  Sitting up in bedside table in manual w/c;  NAD HENT:  Head: Normocephalic and atraumatic.  Eyes: EOM are normal.  Neck: No tracheal deviation present. No thyromegaly present.  CV: RRR Respiratory: Effort normal. No stridor. No respiratory distress. CTA B/L GI: Soft. She exhibits no distension. (+)BS Musculoskeletal:     Comments: No edema or tenderness in extremities  Neurological: she is alert Dysarthria Motor: Grossly 5/5 throughout Minimal ataxia right upper extremity  Skin: Skin is warm.  Psychiatric: unusual affect; impaired memory; slow to respond.     Assessment/Plan: 1. 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  Physiatrist is providing close team supervision and 24 hour management of active medical  problems listed below.  Physiatrist and rehab team continue to assess barriers to discharge/monitor patient progress toward functional and medical goals  Care Tool:  Bathing  Bathing activity did not occur: Safety/medical concerns Body parts bathed by patient: Left arm, Right arm, Chest, Abdomen, Front perineal area, Buttocks, Right upper leg, Left upper leg, Face   Body parts bathed by helper: Right lower leg, Left lower leg     Bathing assist Assist Level: Minimal Assistance - Patient > 75%     Upper Body Dressing/Undressing Upper body dressing   What is the patient wearing?: Pull over shirt    Upper body assist Assist Level: Minimal Assistance - Patient > 75%    Lower Body Dressing/Undressing Lower body dressing      What is the patient wearing?: Incontinence brief     Lower body assist Assist for lower body dressing: Minimal Assistance - Patient > 75%     Toileting Toileting    Toileting assist Assist for toileting: Moderate Assistance - Patient 50 - 74%     Transfers Chair/bed transfer  Transfers assist  Chair/bed transfer activity did not occur: Safety/medical concerns  Chair/bed transfer assist level: Contact Guard/Touching assist     Locomotion Ambulation   Ambulation assist      Assist level: Minimal Assistance - Patient > 75% Assistive device: Walker-rolling Max distance: 150   Walk 10 feet activity   Assist     Assist level: Minimal Assistance - Patient > 75% Assistive device: Walker-rolling   Walk 50 feet activity  Assist Walk 50 feet with 2 turns activity did not occur: Safety/medical concerns  Assist level: Minimal Assistance - Patient > 75% Assistive device: Walker-rolling    Walk 150 feet activity   Assist Walk 150 feet activity did not occur: Safety/medical concerns  Assist level: Minimal Assistance - Patient > 75% Assistive device: Walker-rolling    Walk 10 feet on uneven surface  activity   Assist Walk 10  feet on uneven surfaces activity did not occur: Safety/medical concerns         Wheelchair     Assist Will patient use wheelchair at discharge?: No Type of Wheelchair: Manual    Wheelchair assist level: Supervision/Verbal cueing Max wheelchair distance: 150'    Wheelchair 50 feet with 2 turns activity    Assist        Assist Level: Supervision/Verbal cueing   Wheelchair 150 feet activity     Assist      Assist Level: Supervision/Verbal cueing   Blood pressure (!) 98/57, pulse (!) 57, temperature 98.1 F (36.7 C), resp. rate 16, height 5\' 8"  (1.727 m), weight 73.9 kg, SpO2 100 %.  Medical Problem List and Plan: 1. 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 .              -patient may shower             -ELOS/Goals:-13 days/supervision/min A             Continue CIR therapies.   1/30: Patient asks questions about why she was taken off her seizure medications prior to this admission. Explained that since she was seizure free prior to admission her physician likely felt the risks of continued seizure medication outweighed the benefits. Asks the difference between seizure and stroke and I explained to her.  2.  Antithrombotics: -DVT/anticoagulation:  Will add SQ lovenox as 7 days out from bleed and high risk for DVTs             -antiplatelet therapy: N/A 3. Pain Management: tylenol prn.  1/31: denies pain  4. Mood: LCSW to follow for evaluation and support.              -antipsychotic agents: N/a 5. Neuropsych: This patient is not fully capable of making decisions on her own behalf. 6. Skin/Wound Care: Routine pressure relief measures.  7. Fluids/Electrolytes/Nutrition:  Monitor I/O. 8. 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 Cont 5 mg daily until Neuro/Onc follow up.  2/1- will double check on Decadron dosing- since appears it was stopped-will restart at 4 mg PO daily for now, since 5 mg is unusual dose.  2/3- will need f/u with Dr Isidore Moos after d/c and discuss radiation therapy.   9. Acute  AKI: Encourage fluid intake to help resolve dehydration.  1/28- BUN is 24 and Cr is 0.94 10. Leucocytosis: Monitor for signs of infection. Will order UCS to monitor for efficacy of treatment.             1/28- WBC down to 11.4 from 12.3- f/u U/A negative- likely from decadron.   2/1- WBC 11.5- U/A was negative; no other signs of infection, will monitor 11. Dispo  2/2- will call Husband today to let him know how pt's doing after team conference.   2/3- talked with son  2/2- spent 25 minutes on phone call discussing prognosis and plan for more radiation therapy- need appointment with Dr Isidore Moos- suggest family set up.     LOS: 7 days A FACE TO FACE EVALUATION WAS PERFORMED  Andi Mahaffy 12/11/2019, 8:40 AM

## 2019-12-11 NOTE — Progress Notes (Signed)
Physical Therapy Session Note  Patient Details  Name: Hailey Hill MRN: [id number] Date of Birth: 1951-05-24  Today's Date: 12/11/2019 PT Individual Time: 0800-0900 PT Individual Time Calculation (min): 60 min   Short Term Goals: Week 1:  PT Short Term Goal 1 (Week 1): Pt will perform bed mobility with supervision PT Short Term Goal 2 (Week 1): Pt will be able to perform functional transfers with CGA PT Short Term Goal 3 (Week 1): Pt will be able to gait x 150' with min assist PT Short Term Goal 4 (Week 1): Pt will be able to perform stairs with R rail with min assist  Skilled Therapeutic Interventions/Progress Updates:    Pt received seated in recliner in room, agreeable to PT session. No complaints of pain. Sit to stand with CGA to RW throughout session. Pt demos good memory of safe hand placement during transfer with min cueing needed during session. Ambulation 4 x 150 ft with RW at CGA to min A level, min A required when pt distracted and has episodes of LOB. Ascend/descend 4 stairs laterally with R handrail and min A, cueing for safe technique. Static standing balance with one UE support on RW and min A for balance while performing Wii bowling game, pt demos fair ability to play game correctly and becomes easily frustrated with lack of success. Standing balance on Wii balance board with BUE support on RW and min A for balance performing R/L lateral weight shifting follow visual cues. Pt again becomes frustrated with balance task due to poor success on penguin balance game. Tandem ambulation 4 x 10 ft with BUE support and min A for balance. Pt left seated in recliner in room with needs in reach, quick release belt and chair alarm in place at end of session.  Therapy Documentation Precautions:  Precautions Precautions: Fall Precaution Comments: posterior bias; R inattention Restrictions Weight Bearing Restrictions: No   Therapy/Group: Individual Therapy   Excell Seltzer, PT,  DPT  12/11/2019, 12:04 PM

## 2019-12-11 NOTE — Progress Notes (Signed)
Patient was urge incontinent on way to bathroom. DId not want to change underwear or pants even though they were damp. Offered her hospital gown and she declined.

## 2019-12-11 NOTE — Progress Notes (Signed)
Speech Language Pathology Daily Session Note  Patient Details  Name: Hailey Hill MRN: [id number] Date of Birth: 1951-09-10  Today's Date: 12/11/2019 SLP Individual Time: 1015-1100 SLP Individual Time Calculation (min): 45 min  Short Term Goals: Week 1: SLP Short Term Goal 1 (Week 1): Pt will demonstrate selective attention for 5 minutes with Min A cues. SLP Short Term Goal 2 (Week 1): Pt will complete complex problem solving tasks with Min A cues. SLP Short Term Goal 3 (Week 1): Pt will demonstrate intellectual awareness by identifying 2 physical and 2 cognitive deficits with Min A cues. SLP Short Term Goal 4 (Week 1): Given Min A cues, pt will utilize compensatory memory strategies to recall daily information in 7 out of 10 opportunities.  Skilled Therapeutic Interventions:  Skilled treatment session targeted cognition goals. SLP facilitated session by providing Min A verbal cues to pre-plan semi-complex problem solving tasks rather than impulsively starting task. When preplanning, pt able to correctly solve tasks in sequencial manner. Without preplanning, pt is aware of errors but is unable to correct them. So preplanning reduces errors and increases her ability to self-correct as well. With Mod A verbal cues, pt able to recall activities performed in PT session and concepts discussed in neuropysch visit. Pt lacks intellectual awareness and therefore demonstrates decreased anticipatory awareness. Pt required Maximal verbal and written cues to anticipate that she will require help every day of the week not just MWF, pt to find help from someone in her church for Tuesday and Thursdays. Pt left upright in her recliner, lap belt alarm on and all needs within reach. Continue per current plan of care.      Pain Pain Assessment Pain Scale: 0-10 Pain Score: 0-No pain  Therapy/Group: Individual Therapy  Hailey Hill 12/11/2019, 11:20 AM

## 2019-12-11 NOTE — Progress Notes (Signed)
Occupational Therapy Session Note  Patient Details  Name: Hailey Hill MRN: [id number] Date of Birth: Oct 12, 1951  Today's Date: 12/11/2019 OT Individual Time: 1130-1200 OT Individual Time Calculation (min): 30 min    Short Term Goals: Week 2:  OT Short Term Goal 1 (Week 2): STG=LTG secondary to ELOS  Skilled Therapeutic Interventions/Progress Updates:    Pt resting in recliner upon arrival and agreeable to therapy. OT intervention with focus on functional amb with RW, standing balance, toileting, bed mobility, and safety awareness to increase independence with BADLs. Pt amb with RW from room to Day Room.  Pt with LOB X 4 between room and nursing station with posterior lean.  Pt aware of LOB but unable to self correct. Pt practiced getting in/out of standard bed with supervision. Pt amb with RW to gym and practiced maneuvering obstacle course with cones on floor.  Pt requires mod verbal cues for safety awareness with stand>sit. Pt returned to room and requested to use toilet.  Supervision for toileting tasks. Pt remained in recliner with all needs within reach and belt alarm activated.   Therapy Documentation Precautions:  Precautions Precautions: Fall Precaution Comments: posterior bias; R inattention Restrictions Weight Bearing Restrictions: No   Pain:     Therapy/Group: Individual Therapy  [name] 12/11/2019, 12:01 PM

## 2019-12-11 NOTE — Progress Notes (Signed)
Occupational Therapy Weekly Progress Note  Patient Details  Name: Hailey Hill MRN: [id number] Date of Birth: 08/14/1951  Beginning of progress report period: December 05, 2019 End of progress report period: December 11, 2019  Patient has met 3 of 3 short term goals.  Pt progressing with BADLs, functional amb/transfers with RW, standing balance, and safety awareness.  Pt completes bathing/dressing tasks with close supervision and occasional CGA for balance when standing and amb with RW. Pt requires mod verbal cues for safety awareness with all mobility and transitional movements. Pt's husband has not been present for therapy.  Patient continues to demonstrate the following deficits: muscle weakness, decreased coordination, decreased visual acuity, decreased attention, decreased awareness, decreased problem solving, decreased safety awareness and decreased memory and decreased balance strategies and therefore will continue to benefit from skilled OT intervention to enhance overall performance with BADL and Reduce care partner burden.  Patient progressing toward long term goals..  Continue plan of care.  OT Short Term Goals Week 1:  OT Short Term Goal 1 (Week 1): Pt will stand to perform 2 grooming tasks at the sink with close supervision OT Short Term Goal 1 - Progress (Week 1): Met OT Short Term Goal 2 (Week 1): Pt will perform toileting wtih close supervision OT Short Term Goal 2 - Progress (Week 1): Met OT Short Term Goal 3 (Week 1): Pt will demonstrate daily recall of familar ADL tasks with min  A OT Short Term Goal 3 - Progress (Week 1): Met OT Short Term Goal 4 (Week 1): Pt will pick out clothing and sequencing donning clothing with only min cues Week 2:  OT Short Term Goal 1 (Week 2): STG=LTG secondary to ELOS     [name] 12/11/2019, 6:52 AM

## 2019-12-11 NOTE — Consult Note (Signed)
Neuropsychological Consultation   Patient:   Hailey Hill   DOB:   December 11, 1950  MR Number:  [id number]  Location:  [facility] [address] [facility] [address] [place] [id number] [place] [address] Dept: [place]: [phone]           Date of Service:   12/11/19  Start Time:   9 AM End Time:   10 AM  Provider/Observer:  Ilean Skill, Psy.D.       Clinical Neuropsychologist       Billing Code/Service: 96158/96159  Chief Complaint:    Hailey Hill is a 69 year old female with history of SCLC with SVC syndrome, falls with humerus fracture 06/2019 with diagnosis of cerebellar mets treated with XRT, left basal and subclavian vein thrombosis, peripheral neuropathy.  Patient admitted on 11/27/19 after being found confused with slurred speech and blood-tinged vomitus and bowel incontinence.  CT showed small hemorrhage in right cerebellum without change in mass-effect and small hemorrhage contusion right temporal lobe.  Neurology was consulted and felt that the patient likely had seizure activity leading to loss of consciousness, fall and trauma to head.  Patient was placed on Keppra for seizure prophylaxis and Decadron added to help manage edema.  EEG was negative for epileptiform discharge.  It was felt that the patient had progressive brain mets due to additional lesions likely due to failure of treatment and supportive care was recommended.  Patient had therapy eval's and was recommended for the comprehensive inpatient rehab program due to functional decline.  Patient with improving mood and motivation.  Cognition is improving with continued mood and motor issues related to cerebellar disturbance.    Reason for Service:  The patient was referred for neuropsychological consultation due to coping and adjustment issues.  The patient has been very agitated particularly when she is being helped with movement and gets very agitated when  she feels like she is being "grabbed".  Below is the HPI for the current admission.  HPI: Hailey Hill is a 69 year old female with history of SCLC with SVC syndrome, falls with humerus Fx 06/2019 with diagnosis of cerebellar mets treated with XRT, left basal and subclavian vein thrombosis, peripheral neuropathy, who was admitted on 11/27/2019 after being found confused, with slurred speech, blood tinged vomitus, and bowel incontinence. History taken from chart review and patient. CT showing small hemorrhage in right cerebellum without change in mass-effect and small hemorrhagic contusion right temporal lobe.  Neurology was consulted and felt that patient likely with seizure activity leading to LOC, fall and trauma to head.  She was placed on Keppra for seizure prophylaxis and Decadron added to help manage edema.  EEG was negative for epileptiform discharges and mild encephalopathy noted Dr. Saintclair Halsted was consulted for input and felt that patient with progressive brain mets due to additionally lesions likely due to failure of treatment and supportive care recommended.  Dr. Mickeal Skinner was consulted for input and felt that lesions best suited for radiotherapy once patient clinically improved, planning coordinate with Dr. Isidore Moos.  Dr. Lanell Persons to follow-up for input on treatment as well as need for repeat MRI.  He recommended continuing decadron with taper as mentation improved.  Hospital course further complicated by UTI and treated with Rocephin x3 days.  Therapy ongoing and patient continues to have poor safety awareness with impulsivity, decrease in problem-solving and unsteady gait affecting ADLs and mobility.  CIR recommended due to functional decline. Please see  preadmission assessment from earlier today.   Current Status:  Patient with improved mood and improving mental status.  She was more clear as to time and clear in stating her goals here and once she goes home.  Times of tearfulness and strong emotional  response, this may be related to neurological issues directly to her cerebellar mets.  She is motivated to improve walking and safety when walking and going up steps.  Aware of risk falling backwards on steps (due to cerebellar dysfunction) associated with anxiety in patient in appropriate response.  Patient also not driving or planning on driving.    Behavioral Observation: Hailey Hill  presents as a 69 y.o.-year-old Right Caucasian Female who appeared her stated age. her dress was Appropriate and she was Well Groomed and her manners were Appropriate to the situation.  her participation was indicative of Appropriate and Attentive behaviors.  There were any physical disabilities noted.  she displayed an appropriate level of cooperation and motivation.     Interactions:    Active Appropriate  Attention:   within normal limits   Memory:   within normal limits; r  Visuo-spatial:  not examined  Speech (Volume):  normal  Speech:   normal; normal  Thought Process:  Coherent and Relevant  Though Content:  WNL; not suicidal and not homicidal  Orientation:   person, place, time/date and situation  Judgment:   Fair  Planning:   Fair  Affect:    Appropriate and Tearful  Mood:    Dysphoric  Insight:   Good  Intelligence:   normal  Medical History:   Past Medical History:  Diagnosis Date  . Complication of anesthesia    had complications with "gas buildup" after tubal lig  . Dyslipidemia   . History of radiation therapy 07/16/19, 07/19/19, 07/22/19   SRS radiation to 2 cerebellum targets.   . Lung cancer ([place]) dx'd 07/2010   sm cell   . Peripheral neuropathy   . SVC syndrome   . Thrombus 37/90/2409   Left basilic vein,  Left subclavian vein     Psychiatric History:  No prior psychiatric history  Family Med/Psych History:  Family History  Problem Relation Age of Onset  . Nephrolithiasis Son   . Heart attack Father   . Heart attack Paternal Uncle   . Heart attack Paternal  Grandfather     Risk of Suicide/Violence: low patient denies any suicidal or homicidal ideation.  Impression/DX:  Hailey Hill is a 69 year old female with history of SCLC with SVC syndrome, falls with humerus fracture 06/2019 with diagnosis of cerebellar mets treated with XRT, left basal and subclavian vein thrombosis, peripheral neuropathy.  Patient admitted on 11/27/19 after being found confused with slurred speech and blood-tinged vomitus and bowel incontinence.  CT showed small hemorrhage in right cerebellum without change in mass-effect and small hemorrhage contusion right temporal lobe.  Neurology was consulted and felt that the patient likely had seizure activity leading to loss of consciousness, fall and trauma to head.  Patient was placed on Keppra for seizure prophylaxis and Decadron added to help manage edema.  EEG was negative for epileptiform discharge.  It was felt that the patient had progressive brain mets due to additional lesions likely due to failure of treatment and supportive care was recommended.  Patient had therapy eval's and was recommended for the comprehensive inpatient rehab program due to functional decline.  Patient with improving mood and motivation.  Cognition is improving with continued mood  and motor issues related to cerebellar disturbance.    Patient with improved mood and improving mental status.  She was more clear as to time and clear in stating her goals here and once she goes home.  Times of tearfulness and strong emotional response, this may be related to neurological issues directly to her cerebellar mets.  She is motivated to improve walking and safety when walking and going up steps.  Aware of risk falling backwards on steps (due to cerebellar dysfunction) associated with anxiety in patient in appropriate response.  Patient also not driving or planning on driving.   Disposition/Plan:  Will follow up with the patient first of next week.  Diagnosis:     Adjustment disorder with anxiety/aggitation        Electronically Signed   _______________________ Ilean Skill, Psy.D.

## 2019-12-12 ENCOUNTER — Inpatient Hospital Stay (HOSPITAL_COMMUNITY): Payer: Medicare HMO | Admitting: *Deleted

## 2019-12-12 ENCOUNTER — Inpatient Hospital Stay (HOSPITAL_COMMUNITY): Payer: Medicare HMO

## 2019-12-12 ENCOUNTER — Inpatient Hospital Stay (HOSPITAL_COMMUNITY): Payer: Medicare HMO | Admitting: Speech Pathology

## 2019-12-12 ENCOUNTER — Inpatient Hospital Stay (HOSPITAL_COMMUNITY): Payer: Medicare HMO | Admitting: Physical Therapy

## 2019-12-12 NOTE — Progress Notes (Signed)
Physical Therapy Weekly Progress Note  Patient Details  Name: Hailey Hill MRN: [id number] Date of Birth: 07/02/1951  Beginning of progress report period: December 05, 2019 End of progress report period: December 12, 2019  Today's Date: 12/12/2019 PT Individual Time: 0800-0900; 1512-1540 PT Individual Time Calculation (min): 60 min and 28 min  Patient has met 4 of 4 short term goals.  Patient is able to complete bed mobility at Supervision level, transfers at CGA level with use of RW, ambulate up to 150 ft with use of RW at min A level overall due to occasional LOB, and perform 4 stairs with R handrail and min A. Patient is making good progress towards Supervision to CGA level goals overall.  Patient continues to demonstrate the following deficits muscle weakness, decreased attention, decreased awareness, decreased problem solving, decreased safety awareness and decreased memory and decreased standing balance, decreased postural control and decreased balance strategies and therefore will continue to benefit from skilled PT intervention to increase functional independence with mobility.  Patient progressing toward long term goals..  Continue plan of care.  PT Short Term Goals Week 1:  PT Short Term Goal 1 (Week 1): Pt will perform bed mobility with supervision PT Short Term Goal 1 - Progress (Week 1): Met PT Short Term Goal 2 (Week 1): Pt will be able to perform functional transfers with CGA PT Short Term Goal 2 - Progress (Week 1): Met PT Short Term Goal 3 (Week 1): Pt will be able to gait x 150' with min assist PT Short Term Goal 3 - Progress (Week 1): Met PT Short Term Goal 4 (Week 1): Pt will be able to perform stairs with R rail with min assist PT Short Term Goal 4 - Progress (Week 1): Met Week 2:  PT Short Term Goal 1 (Week 2): =LTG due to ELOS  Skilled Therapeutic Interventions/Progress Updates:    Session 1: Pt received seated in bed, agreeable to PT session. No complaints of  pain. Bed mobility Supervision, setup A to don shoes while seated EOB. Sit to stand with CGA to RW. Ambulation 2 x 150 ft with RW at CGA level, v/c to focus on task of ambulating to gym as pt easily distracted by external environment. Ascend/descend 4 stairs with R handrail laterally with min A for balance, mod cueing for safety and sequencing. Standing balance with no UE support performing ball bounce off of rebounder, min A for balance. Gradual decrease in BOS in standing while performing rebounder, one instance of LOB requiring mod A to correct. Sit to stand 3 x 5 reps from mat table with no AD and CGA to min A with focus on decreased UE support. Sidesteps L/R 2 x 10 ft with RW and min A for balance for BLE strengthening and coordination training. Pt reports onset of indigestion at end of session, requests saltine crackers. Assisted pt back to bed at Supervision level. Pt left semi-reclined in bed with needs in reach, bed alarm in place at end of session.  Session 2: Pt received seated in bed, agreeable to PT session. No complaints of pain. Bed mobility Supervision. Pt is setup A to don shoes. Ambulation into bathroom with RW and CGA. Toilet transfer with CGA, setup A for pericare and CGA for standing balance during clothing management. Ambulation room to/from therapy gym with RW at CGA level. Session focus on static standing balance with no UE support with multidirectional perturbations. Pt demos improvement in ankle balance strategy this session, has two   posterior LOB and is able to safely lower herself to mat table to sit. Pt left seated in recliner in room with needs in reach, quick release belt and chair alarm in place at end of session.    Therapy Documentation Precautions:  Precautions Precautions: Fall Precaution Comments: posterior bias; R inattention Restrictions Weight Bearing Restrictions: No   Therapy/Group: Individual Therapy    , PT, DPT  12/12/2019, 11:55 AM  

## 2019-12-12 NOTE — Progress Notes (Signed)
[facility] PHYSICAL MEDICINE & REHABILITATION PROGRESS NOTE   Subjective/Complaints:  Pt reports waiting on PT- in bed watching TV and wearing winter hat; eating OK per pt; doesn't sleep well since in hospital.     ROS: denies SOB, CP, abd pain, HA, vision changes, N/V/D/C.  Objective:   No results found. No results for input(s): WBC, HGB, HCT, PLT in the last 72 hours. No results for input(s): NA, K, CL, CO2, GLUCOSE, BUN, CREATININE, CALCIUM in the last 72 hours.  Intake/Output Summary (Last 24 hours) at 12/12/2019 0902 Last data filed at 12/11/2019 1907 Gross per 24 hour  Intake 240 ml  Output --  Net 240 ml     Physical Exam: Vital Signs Blood pressure (!) 93/58, pulse (!) 59, temperature 98.2 F (36.8 C), resp. rate 17, height 5\' 8"  (1.727 m), weight 73.9 kg, SpO2 98 %.  Physical Exam  Vitals reviewed. Constitutional: Sleeping comfortably, easily arousable.  Awake, alert, appropriate, unusual affect; wearing winter hat in bed again this AM;  sitting up in hospital bed;  NAD HENT:  Head: Normocephalic and atraumatic.  Eyes: EOM are normal.  Neck: No tracheal deviation present. No thyromegaly present.  CV: RRR Respiratory: .CTA B/L GI: Soft. NT, ND. (+) BS Musculoskeletal:     Comments: No edema or tenderness in extremities  Neurological: she is alert Dysarthria Motor: Grossly 5/5 throughout Minimal ataxia right upper extremity  Skin: Skin is warm.  Psychiatric: unusual affect; impaired memory; slow to respond.     Assessment/Plan: 1. Functional deficits secondary to [place] secondary to seizures/fall/LOC with current hx of progressive cerebellar mets from SCLC and SVC syndrome which require 3+ hours per day of interdisciplinary therapy in a comprehensive inpatient rehab setting.  Physiatrist is providing close team supervision and 24 hour management of active medical problems listed below.  Physiatrist and rehab team continue to assess barriers to  discharge/monitor patient progress toward functional and medical goals  Care Tool:  Bathing  Bathing activity did not occur: Safety/medical concerns Body parts bathed by patient: Left arm, Right arm, Chest, Abdomen, Front perineal area, Buttocks, Right upper leg, Left upper leg, Face   Body parts bathed by helper: Right lower leg, Left lower leg     Bathing assist Assist Level: Minimal Assistance - Patient > 75%     Upper Body Dressing/Undressing Upper body dressing   What is the patient wearing?: Pull over shirt    Upper body assist Assist Level: Minimal Assistance - Patient > 75%    Lower Body Dressing/Undressing Lower body dressing      What is the patient wearing?: Incontinence brief     Lower body assist Assist for lower body dressing: Minimal Assistance - Patient > 75%     Toileting Toileting    Toileting assist Assist for toileting: Moderate Assistance - Patient 50 - 74%     Transfers Chair/bed transfer  Transfers assist  Chair/bed transfer activity did not occur: Safety/medical concerns  Chair/bed transfer assist level: Contact Guard/Touching assist     Locomotion Ambulation   Ambulation assist      Assist level: Minimal Assistance - Patient > 75% Assistive device: Walker-rolling Max distance: 150   Walk 10 feet activity   Assist     Assist level: Minimal Assistance - Patient > 75% Assistive device: Walker-rolling   Walk 50 feet activity   Assist Walk 50 feet with 2 turns activity did not occur: Safety/medical concerns  Assist level: Minimal Assistance - Patient > 75% Assistive  device: Walker-rolling    Walk 150 feet activity   Assist Walk 150 feet activity did not occur: Safety/medical concerns  Assist level: Minimal Assistance - Patient > 75% Assistive device: Walker-rolling    Walk 10 feet on uneven surface  activity   Assist Walk 10 feet on uneven surfaces activity did not occur: Safety/medical concerns          Wheelchair     Assist Will patient use wheelchair at discharge?: No Type of Wheelchair: Manual    Wheelchair assist level: Supervision/Verbal cueing Max wheelchair distance: 150'    Wheelchair 50 feet with 2 turns activity    Assist        Assist Level: Supervision/Verbal cueing   Wheelchair 150 feet activity     Assist      Assist Level: Supervision/Verbal cueing   Blood pressure (!) 93/58, pulse (!) 59, temperature 98.2 F (36.8 C), resp. rate 17, height 5\' 8"  (1.727 m), weight 73.9 kg, SpO2 98 %.  Medical Problem List and Plan: 1. 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 .              -patient may shower             -ELOS/Goals:-13 days/supervision/min A             Continue CIR therapies.   1/30: Patient asks questions about why she was taken off her seizure medications prior to this admission. Explained that since she was seizure free prior to admission her physician likely felt the risks of continued seizure medication outweighed the benefits. Asks the difference between seizure and stroke and I explained to her.  2.  Antithrombotics: -DVT/anticoagulation:  Will add SQ lovenox as 7 days out from bleed and high risk for DVTs             -antiplatelet therapy: N/A 3. Pain Management: tylenol prn.  1/31: denies pain  4. Mood: LCSW to follow for evaluation and support.              -antipsychotic agents: N/a 5. Neuropsych: This patient is not fully capable of making decisions on her own behalf. 6. Skin/Wound Care: Routine pressure relief measures.  7. Fluids/Electrolytes/Nutrition:  Monitor I/O. 8. 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 Cont 5 mg daily until Neuro/Onc follow up.  2/1- will double check  on Decadron dosing- since appears it was stopped-will restart at 4 mg PO daily for now, since 5 mg is unusual dose.  2/3- will need f/u with Dr Isidore Moos after d/c and discuss radiation therapy.   9. Acute  AKI: Encourage fluid intake to help resolve dehydration.  1/28- BUN is 24 and Cr is 0.94  2/4- labs in AM 10. Leucocytosis: Monitor for signs of infection. Will order UCS to monitor for efficacy of treatment.             1/28- WBC down to 11.4 from 12.3- f/u U/A negative- likely from decadron.   2/1- WBC 11.5- U/A was negative; no other signs of infection, will monitor  2/4- labs in AM 11. Orthostatic hypotension  2/4- BP soft this AM- will con't regimen and monitor for signs of OH 12. Dispo  2/2- will call Husband today to let him know how pt's doing after team conference.  2/3- talked with son 2/2- spent 25 minutes on phone call discussing prognosis and plan for more radiation therapy- need appointment with Dr Isidore Moos- suggest family set up.     LOS: 8 days A FACE TO FACE EVALUATION WAS PERFORMED  Tache Bobst 12/12/2019, 9:02 AM

## 2019-12-12 NOTE — Progress Notes (Signed)
Speech Language Pathology Weekly Progress and Session Note  Patient Details  Name: RANI IDLER MRN: [id number] Date of Birth: 03-26-1951  Beginning of progress report period: December 02, 2019 End of progress report period: December 12, 2019  Today's Date: 12/12/2019 SLP Individual Time: 1300-1330 SLP Individual Time Calculation (min): 30 min  Short Term Goals: Week 1: SLP Short Term Goal 1 (Week 1): Pt will demonstrate selective attention for 5 minutes with Min A cues. SLP Short Term Goal 1 - Progress (Week 1): Met SLP Short Term Goal 2 (Week 1): Pt will complete complex problem solving tasks with Min A cues. SLP Short Term Goal 2 - Progress (Week 1): Met SLP Short Term Goal 3 (Week 1): Pt will demonstrate intellectual awareness by identifying 2 physical and 2 cognitive deficits with Min A cues. SLP Short Term Goal 3 - Progress (Week 1): Met SLP Short Term Goal 4 (Week 1): Given Min A cues, pt will utilize compensatory memory strategies to recall daily information in 7 out of 10 opportunities. SLP Short Term Goal 4 - Progress (Week 1): Met    New Short Term Goals: Week 2: SLP Short Term Goal 1 (Week 2): STGs= LTGs d/t short remaining length of stay  Weekly Progress Updates:  Pt has made good progress this reporting period and as such she has met al of her STGs. Specifically, pt has made progress in task tolerance, awareness of physical and cognitive deficits and has begun utilizing external memory aids. Family education has been ongoing to prepare for safe discharge plan. Skilled ST continues to be indicated to increase pt's cognitive skills to level of supervision, increase functional independence and reducer caregiver burden.       Intensity: Minumum of 1-2 x/day, 30 to 90 minutes Frequency: 3 to 5 out of 7 days Duration/Length of Stay: 2/10 Treatment/Interventions: Cognitive remediation/compensation;Functional tasks;Patient/family education   Daily Session  Skilled  Therapeutic Interventions: Skilled treatment session targeted caregiver education with pt's son (per his request and CSW request). Pt's son is liaison who conveys information to pt's husband. Education provided on pt's current cognitive deficits and strategies to promote increased recall of information, need for pt to have someone with her 24 hours a day and all questions answered. Pt present and agrees with recommendations.      General    Pain    Therapy/Group: Individual Therapy  Rahmon Heigl 12/12/2019, 3:36 PM

## 2019-12-12 NOTE — Evaluation (Signed)
Recreational Therapy Assessment and Plan  Patient Details  Name: Hailey Hill MRN: [id number] Date of Birth: [birth year] Today's Date: 12/12/2019  Rehab Potential:Good    ELOS: discharge 2/10  Assessment  Problem List:      Patient Active Problem List   Diagnosis Date Noted  . Lung cancer metastatic to brain ([place]) 12/04/2019  . Closed fracture of temporal bone ([place])   . Leukocytosis   . AKI (acute kidney injury) ([place])   . Intracranial hemorrhage, nontraumatic ([place]) 11/27/2019  . Acute lower UTI 11/27/2019  . Fall 07/10/2019  . Closed fracture of head of left humerus   . Metastatic lung cancer (metastasis from lung to other site) ([place]) 07/07/2019  . Fracture, humerus, anatomical neck, left, closed, initial encounter 07/07/2019  . Metastatic cancer to brain ([place]) 07/07/2019  . Chest pain with moderate risk for cardiac etiology 12/14/2015  . Small cell lung cancer ([place]) 09/16/2013    Past Medical History:      Past Medical History:  Diagnosis Date  . Complication of anesthesia    had complications with "gas buildup" after tubal lig  . Dyslipidemia   . History of radiation therapy 07/16/19, 07/19/19, 07/22/19   SRS radiation to 2 cerebellum targets.   . Lung cancer ([place]) dx'd 07/2010   sm cell   . Peripheral neuropathy   . SVC syndrome   . Thrombus 78/29/5621   Left basilic vein,  Left subclavian vein   Past Surgical History:       Past Surgical History:  Procedure Laterality Date  . CERVICAL BIOPSY    . CHOLECYSTECTOMY N/A 07/25/2016   Procedure: LAPAROSCOPIC CHOLECYSTECTOMY;  Surgeon: Coralie Keens, MD;  Location: [facility];  Service: General;  Laterality: N/A;  . TONSILLECTOMY    . TUBAL LIGATION      Assessment & Plan Clinical Impression: Patient is a 69 year old female with history of SCLC with SVC syndrome, falls with humerus Fx 06/2019 with diagnosis of cerebellar mets treated with XRT, left basal and subclavian  vein thrombosis, peripheral neuropathy, who was admitted on 11/27/2019 after being found confused, with slurred speech, blood tinged vomitus, and bowel incontinence. History taken from chart review and patient. CT showing small hemorrhage in right cerebellum without change in mass-effect and small hemorrhagic contusion right temporal lobe. Neurology was consulted and felt that patient likely with seizure activity leading to LOC, fall and trauma to head. She was placed on Keppra for seizure prophylaxis and Decadron added to help manage edema. EEG was negative for epileptiform discharges and mild encephalopathy noted Dr. Saintclair Halsted was consulted for input and felt that patient with progressive brain mets due to additionally lesions likely due to failure of treatment and supportive care recommended. Dr. Mickeal Skinner was consulted for input and felt that lesions best suited for radiotherapy once patient clinically improved, planning coordinate with Dr. Isidore Moos. Dr. Lanell Persons to follow-up for input on treatment as well as need for repeat MRI. He recommended continuing decadron with taper as mentation improved. Hospital course further complicated by UTI and treated with Rocephin x3 days. Therapy ongoing and patient continues to have poor safety awareness with impulsivity, decrease in problem-solving and unsteady gait affecting ADLs and mobility. CIR recommended due to functional decline  Patient transferred to CIR on 12/04/2019.   Pt presents with decreased activity tolerance, decreased functional mobility, decreased balance, right inattention, decreased attention, decreased awareness, decreased problem solving, decreased safety Limiting pt's independence with leisure/community pursuits.  Met with pt today to  discuss leisure interests and TR services.  Pt in bed reporting feeling bad and anxious due to indigestion.  Pt agreeable to discussion at bedside. Leisure interest assessment discussed and way to use leisure as a  coping mechanism for stress and anxiety.  Pt stated understanding and used ipad for sharing information/pictures about some of her coping strategies.  Plan No further TR as pt is expected to discharge 2/10  Recommendations for other services: Neuropsych  Discharge Criteria: Patient will be discharged from TR if patient refuses treatment 3 consecutive times without medical reason.  If treatment goals not met, if there is a change in medical status, if patient makes no progress towards goals or if patient is discharged from hospital.  The above assessment, treatment plan, treatment alternatives and goals were discussed and mutually agreed upon: by patient  [place] 12/12/2019, 11:53 AM

## 2019-12-12 NOTE — Progress Notes (Signed)
Patient ID: Hailey Hill, female   DOB: 01-02-1951, 69 y.o.   MRN: [id number]   PMT will sign off at this time.  Multiple conversations with son and husband regarding role of palliative in a holistic treatment plan.  Plan is for home from [place] information given.  I'd be happy to see them in the OP cancer center if they desire.   No charge  Wadie Lessen NP  Palliative Medicine Team Team Phone # [phone] Pager [phone]

## 2019-12-12 NOTE — Progress Notes (Signed)
Physical Therapy Session Note  Patient Details  Name: Hailey Hill MRN: [id number] Date of Birth: October 24, 1951  Today's Date: 12/12/2019 PT Individual Time: 1615-1730 PT Individual Time Calculation (min): 75 min   Short Term Goals: Week 2:  PT Short Term Goal 1 (Week 2): =LTG due to ELOS  Skilled Therapeutic Interventions/Progress Updates:   Pt received sitting in WC and agreeable to PT. Pt instructed in gait training through hall with supervision assist overall and RW x 162f, 2041f [id number] cues for safety with RW in turns from PT. Pt with mild improvement to task compared to previous session and able to maintain forward progression while engaged in conversation.   PT instructed in multiple path finding tasks to locate 3 objects in order, required moderate questioning cues for recall of proper order and names of objects. Pt then performed path finding task in hall to locate sticky notes in 1-A, 2-b pattern with min questioning cues for proper target.   Dynamic balance training with volley ball: lift overhead 2 x 10 , lateral tap on rails 2 x 30 sec, squat with shoulder at 90deg flexion, toss off wall x10 Bil. Min assist throughout for safety with only 3-4 posterior LOB noted. Cues for use of ankle strategy to correct with LOB noted.   Nustep reciprocal endurance and sustained attention task x 10mi49ms, level 5>6. Pt able to sustain movement throughout 10 mintues while engaged in conversation and was able to recall speed parameters to maintain <60steps per minute and target end time.   Pt returned to room and performed ambulatory transfer  to bed with RW and supervision assist. Sit>supine completed with supervision assist for safety, and left supine in bed with call bell in reach and all needs met.         Therapy Documentation Precautions:  Precautions Precautions: Fall Precaution Comments: posterior bias; R inattention Restrictions Weight Bearing Restrictions: No     Vital Signs: Therapy Vitals Temp: 98.2 F (36.8 C) Temp Source: Oral Pulse Rate: 68 Resp: 17 BP: 101/65 Patient Position (if appropriate): Lying Oxygen Therapy SpO2: 97 % Pain: denies   Therapy/Group: Individual Therapy  [name]2021, 5:45 PM

## 2019-12-12 NOTE — Progress Notes (Addendum)
Social Work Patient ID: Hailey Hill, female   DOB: 25-Aug-1951, 69 y.o.   MRN: [id number]    SW left message for  pt son [name] ([phone]) to follow up about family education on Saturday 10am-2pm versus Friday and Tuesday afternoon between 3pm-4pm; and to discuss discharge plan for 24/7 care. SW waiting on follow-up.   *SW spoke with pt son [name]. to discuss above. Pt son reports that between him and his father they are planning to provide 24/7 care. He states that if his father is unable to provide the first initial weeks of care, he will do it. SW encouraged to discuss with father if he has FMLA forms to be completed to bring in. Reports that his father will be here on Saturday for family education. SW discussed some recommended DME: RW, BSC,and TTB. Reports that there was DME left behind from a family member that passed away, and will see if these items are available. SW encouraged taking pictures to show therapy team this weekend. SW did inform if a TTB was required, pt would need to private pay for item as not covered under insurance. SW will follow-up about DME.   SW met with pt in room to discuss above. Pt has HHA list via [email or web address].   Pt likely to need HHPT/OT/ST.   Pt preferred [facility] is [facility]. SW spoke with Mateo Flow with [facility] [phone]) who declined referral as no speech therapy in [place] area.  SW left message with Tiffany/[facility] [phone]) about referral. SW waiting on follow-up.  *HHPT/OT/ST referral accepted by [facility].  [name], MSW, Social Worker Office (preferred): [phone] Cell: [phone] Fax: [phone]

## 2019-12-12 NOTE — Progress Notes (Signed)
Occupational Therapy Session Note  Patient Details  Name: Hailey Hill MRN: [id number] Date of Birth: 02/26/1951  Today's Date: 12/12/2019 OT Individual Time: 1000-1100 OT Individual Time Calculation (min): 60 min    Short Term Goals: Week 2:  OT Short Term Goal 1 (Week 2): STG=LTG secondary to ELOS  Skilled Therapeutic Interventions/Progress Updates:    Pt resting in bed upon arrival with TR present.  OT intervention with focus on BLE therex on NuStep (load 6 for 8 mins), functional amb with RW, dynamic standing balance, safety awareness, and activity tolerance to increase independence with BADLs. Pt amb with RW to Day Room for BLE therex on NuStep (see above), before returning to small gym for dynamic standing activities. Amb with RW with CGA. Dynamic standing balance reaching across midline, squatting, trunk rotation all with CGA and min A X 4 for LOB. Pt comments that she is aware of LOB and "I've got it" but requires assistance to correct. Pt with limited emergent awareness. Pt returned to room and requested to return to bed. Pt c/o return of heartburn.  RN Nona Dell notified.  Pt remained in bed with all needs within reach and bed alarm activated.   Therapy Documentation Precautions:  Precautions Precautions: Fall Precaution Comments: posterior bias; R inattention Restrictions Weight Bearing Restrictions: No  Pain:  Pt denies pain this morning, but c/o "heartburn" at end of session; RN notified   Therapy/Group: Individual Therapy  [name] 12/12/2019, 11:53 AM

## 2019-12-13 ENCOUNTER — Inpatient Hospital Stay (HOSPITAL_COMMUNITY): Payer: Medicare HMO

## 2019-12-13 ENCOUNTER — Inpatient Hospital Stay (HOSPITAL_COMMUNITY): Payer: Medicare HMO | Admitting: Speech Pathology

## 2019-12-13 ENCOUNTER — Inpatient Hospital Stay (HOSPITAL_COMMUNITY): Payer: Medicare HMO | Admitting: Physical Therapy

## 2019-12-13 LAB — CBC WITH DIFFERENTIAL/PLATELET
Abs Immature Granulocytes: 0.2 10*3/uL — ABNORMAL HIGH (ref 0.00–0.07)
Basophils Absolute: 0 10*3/uL (ref 0.0–0.1)
Basophils Relative: 0 %
Eosinophils Absolute: 0 10*3/uL (ref 0.0–0.5)
Eosinophils Relative: 0 %
HCT: 39.2 % (ref 36.0–46.0)
Hemoglobin: 13.4 g/dL (ref 12.0–15.0)
Immature Granulocytes: 2 %
Lymphocytes Relative: 17 %
Lymphs Abs: 2 10*3/uL (ref 0.7–4.0)
MCH: 30.8 pg (ref 26.0–34.0)
MCHC: 34.2 g/dL (ref 30.0–36.0)
MCV: 90.1 fL (ref 80.0–100.0)
Monocytes Absolute: 0.5 10*3/uL (ref 0.1–1.0)
Monocytes Relative: 5 %
Neutro Abs: 9.2 10*3/uL — ABNORMAL HIGH (ref 1.7–7.7)
Neutrophils Relative %: 76 %
Platelets: 249 10*3/uL (ref 150–400)
RBC: 4.35 MIL/uL (ref 3.87–5.11)
RDW: 12.8 % (ref 11.5–15.5)
WBC: 12.1 10*3/uL — ABNORMAL HIGH (ref 4.0–10.5)
nRBC: 0 % (ref 0.0–0.2)

## 2019-12-13 LAB — COMPREHENSIVE METABOLIC PANEL
ALT: 24 U/L (ref 0–44)
AST: 15 U/L (ref 15–41)
Albumin: 3.1 g/dL — ABNORMAL LOW (ref 3.5–5.0)
Alkaline Phosphatase: 61 U/L (ref 38–126)
Anion gap: 9 (ref 5–15)
BUN: 22 mg/dL (ref 8–23)
CO2: 24 mmol/L (ref 22–32)
Calcium: 9.2 mg/dL (ref 8.9–10.3)
Chloride: 105 mmol/L (ref 98–111)
Creatinine, Ser: 0.8 mg/dL (ref 0.44–1.00)
GFR calc Af Amer: >60 mL/min (ref >60)
GFR calc non Af Amer: >60 mL/min (ref >60)
Glucose, Bld: 90 mg/dL (ref 70–99)
Potassium: 4.5 mmol/L (ref 3.5–5.1)
Sodium: 138 mmol/L (ref 135–145)
Total Bilirubin: 0.4 mg/dL (ref 0.3–1.2)
Total Protein: 5.4 g/dL — ABNORMAL LOW (ref 6.5–8.1)

## 2019-12-13 MED ORDER — ACETAMINOPHEN 325 MG PO TABS: 325.0000 mg | ORAL_TABLET | ORAL | Status: DC | PRN

## 2019-12-13 NOTE — Progress Notes (Signed)
[facility] PHYSICAL MEDICINE & REHABILITATION PROGRESS NOTE   Subjective/Complaints:  Per SLP, "distracted by self" during therapy.   Pt had no complaints.   ROS: denies SOB, CP, abd pain, HA, vision changes, N/V/D/C.  Objective:   No results found. Recent Labs    12/13/19 0543  WBC 12.1*  HGB 13.4  HCT 39.2  PLT 249   Recent Labs    12/13/19 0543  NA 138  K 4.5  CL 105  CO2 24  GLUCOSE 90  BUN 22  CREATININE 0.80  CALCIUM 9.2    Intake/Output Summary (Last 24 hours) at 12/13/2019 0837 Last data filed at 12/13/2019 0600 Gross per 24 hour  Intake 480 ml  Output --  Net 480 ml     Physical Exam: Vital Signs Blood pressure 111/64, pulse 63, temperature 98.2 F (36.8 C), resp. rate 20, height 5\' 8"  (1.727 m), weight 73.9 kg, SpO2 99 %.  Physical Exam  Vitals reviewed. Constitutional:   Awake, alert, appropriate, unusual affect; wearing winter hat in bed again this AM;  Sitting EOB working with SLP;  NAD HENT:  Head: Normocephalic and atraumatic.  Eyes: EOM are normal.  Neck: No tracheal deviation present. No thyromegaly present.  CV: RRR Respiratory: .CTA B/L GI: Soft. NT, ND. (+) BS Musculoskeletal:     Comments: No edema or tenderness in extremities  Neurological: she is alert Dysarthria Motor: Grossly 5/5 throughout Minimal ataxia right upper extremity  Skin: Skin is warm.  Psychiatric: unusual affect; impaired memory; slow to respond.     Assessment/Plan: 1. Functional deficits secondary to [place] secondary to seizures/fall/LOC with current hx of progressive cerebellar mets from SCLC and SVC syndrome which require 3+ hours per day of interdisciplinary therapy in a comprehensive inpatient rehab setting.  Physiatrist is providing close team supervision and 24 hour management of active medical problems listed below.  Physiatrist and rehab team continue to assess barriers to discharge/monitor patient progress toward functional and medical goals  Care  Tool:  Bathing  Bathing activity did not occur: Safety/medical concerns Body parts bathed by patient: Left arm, Right arm, Chest, Abdomen, Front perineal area, Buttocks, Right upper leg, Left upper leg, Face   Body parts bathed by helper: Right lower leg, Left lower leg     Bathing assist Assist Level: Minimal Assistance - Patient > 75%     Upper Body Dressing/Undressing Upper body dressing   What is the patient wearing?: Pull over shirt    Upper body assist Assist Level: Minimal Assistance - Patient > 75%    Lower Body Dressing/Undressing Lower body dressing      What is the patient wearing?: Incontinence brief     Lower body assist Assist for lower body dressing: Minimal Assistance - Patient > 75%     Toileting Toileting    Toileting assist Assist for toileting: Moderate Assistance - Patient 50 - 74%     Transfers Chair/bed transfer  Transfers assist  Chair/bed transfer activity did not occur: Safety/medical concerns  Chair/bed transfer assist level: Contact Guard/Touching assist     Locomotion Ambulation   Ambulation assist      Assist level: Contact Guard/Touching assist Assistive device: Walker-rolling Max distance: 150   Walk 10 feet activity   Assist     Assist level: Contact Guard/Touching assist Assistive device: Walker-rolling   Walk 50 feet activity   Assist Walk 50 feet with 2 turns activity did not occur: Safety/medical concerns  Assist level: Contact Guard/Touching assist Assistive device: Walker-rolling  Walk 150 feet activity   Assist Walk 150 feet activity did not occur: Safety/medical concerns  Assist level: Contact Guard/Touching assist Assistive device: Walker-rolling    Walk 10 feet on uneven surface  activity   Assist Walk 10 feet on uneven surfaces activity did not occur: Safety/medical concerns         Wheelchair     Assist Will patient use wheelchair at discharge?: No Type of Wheelchair:  Manual    Wheelchair assist level: Supervision/Verbal cueing Max wheelchair distance: 150'    Wheelchair 50 feet with 2 turns activity    Assist        Assist Level: Supervision/Verbal cueing   Wheelchair 150 feet activity     Assist      Assist Level: Supervision/Verbal cueing   Blood pressure 111/64, pulse 63, temperature 98.2 F (36.8 C), resp. rate 20, height 5\' 8"  (1.727 m), weight 73.9 kg, SpO2 99 %.  Medical Problem List and Plan: 1. 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 .              -patient may shower             -ELOS/Goals:-13 days/supervision/min A             Continue CIR therapies.   1/30: Patient asks questions about why she was taken off her seizure medications prior to this admission. Explained that since she was seizure free prior to admission her physician likely felt the risks of continued seizure medication outweighed the benefits. Asks the difference between seizure and stroke and I explained to her.  2.  Antithrombotics: -DVT/anticoagulation:  Will add SQ lovenox as 7 days out from bleed and high risk for DVTs             -antiplatelet therapy: N/A 3. Pain Management: tylenol prn.  1/31: denies pain  4. Mood: LCSW to follow for evaluation and support.              -antipsychotic agents: N/a 5. Neuropsych: This patient is not fully capable of making decisions on her own behalf. 6. Skin/Wound Care: Routine pressure relief measures.  7. Fluids/Electrolytes/Nutrition:  Monitor I/O. 8. 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 Cont 5 mg daily until Neuro/Onc follow up.  2/1- will double check on Decadron dosing- since appears it was stopped-will restart at 4 mg PO daily for now, since 5 mg is  unusual dose.  2/3- will need f/u with Dr Isidore Moos after d/c and discuss radiation therapy.   9. Acute  AKI: Encourage fluid intake to help resolve dehydration.  1/28- BUN is 24 and Cr is 0.94  2/4- labs in AM 10. Leucocytosis: Monitor for signs of infection. Will order UCS to monitor for efficacy of treatment.             1/28- WBC down to 11.4 from 12.3- f/u U/A negative- likely from decadron.   2/1- WBC 11.5- U/A was negative; no other signs of infection, will monitor  2/4- labs in AM  2/5- WBC 12.1- still on Decadron- afebrile; con't to monitor for signs of infection 11. Orthostatic hypotension  2/4- BP soft this AM- will con't regimen and monitor for signs of OH 12. Dispo  2/2- will call Husband today to let him know how pt's  doing after team conference.   2/3- talked with son 2/2- spent 25 minutes on phone call discussing prognosis and plan for more radiation therapy- need appointment with Dr Isidore Moos- suggest family set up.     LOS: 9 days A FACE TO FACE EVALUATION WAS PERFORMED  Hailey Hill 12/13/2019, 8:37 AM

## 2019-12-13 NOTE — Progress Notes (Signed)
Occupational Therapy Session Note  Patient Details  Name: Hailey Hill MRN: [id number] Date of Birth: 25-Nov-1950  Today's Date: 12/13/2019 OT Individual Time: 1330-1425 OT Individual Time Calculation (min): 55 min    Short Term Goals: Week 2:  OT Short Term Goal 1 (Week 2): STG=LTG secondary to ELOS  Skilled Therapeutic Interventions/Progress Updates:    Pt asleep in bed upon arrival but easily aroused. OT intervention with focus on functional amb with RW, standing balance, safety awareness, and activity tolerance to increase independence with BADLs and improve safety awareness. Pt amb with RW to gym/hallway.  Pt engaged in amb task while locating numbered discs (even numbers). Pt able to locate all discs, turning head while walking, and carrying on conversation with therapist, without LOB-close supervision.  Pt amb with RW to Day Room and engaged in [facility] game X3 without LUE support on RW for balance.  No LOB noted.  Pt able to carry on conversation throughout game. Pt returned to room and remained in bed with all needs within reach. Bed alarm activated.   Therapy Documentation Precautions:  Precautions Precautions: Fall Precaution Comments: posterior bias; R inattention Restrictions Weight Bearing Restrictions: No Pain:  Pt denies pain this afternoon  Therapy/Group: Individual Therapy  [name] 12/13/2019, 2:40 PM

## 2019-12-13 NOTE — Progress Notes (Signed)
Occupational Therapy Session Note  Patient Details  Name: Hailey Hill MRN: [id number] Date of Birth: 09-Sep-1951  Today's Date: 12/13/2019 OT Individual Time: 1130-1200 OT Individual Time Calculation (min): 30 min    Short Term Goals: Week 2:  OT Short Term Goal 1 (Week 2): STG=LTG secondary to ELOS  Skilled Therapeutic Interventions/Progress Updates:    Pt resting in bed upon arrival and agreeable to participating in therapy.  OT intervention with focus on dynamic sitting balance to increase independence with BADLS. Pt amb with RW and engaged in tasks seated unsupported EOM. Tasks included [employer] and bounce back on mini trampoline with small ball and basketball.  No LOB noted and pt did not exhibit any unsafe behaviors. Pt returned to room and remained seated EOB with all needs within reach, awaiting lunch.  Bed alarm activated.   Therapy Documentation Precautions:  Precautions Precautions: Fall Precaution Comments: posterior bias; R inattention Restrictions Weight Bearing Restrictions: No   Pain:     Therapy/Group: Individual Therapy  [name] 12/13/2019, 12:30 PM

## 2019-12-13 NOTE — Progress Notes (Signed)
Speech Language Pathology Daily Session Note  Patient Details  Name: Hailey Hill MRN: [id number] Date of Birth: 1951/09/03  Today's Date: 12/13/2019 SLP Individual Time: 0706-0800 SLP Individual Time Calculation (min): 54 min  Short Term Goals: Week 2: SLP Short Term Goal 1 (Week 2): STGs= LTGs d/t short remaining length of stay  Skilled Therapeutic Interventions:  Skilled treatment session targeted cognition goals. SLP facilitated session by providing complex daily scheduling task. Pt was frequently distracted by thoughts on how she would perform the activities listed. Due to these distractions, mental inflexibility and deficits in problem solving, pt was not successful in completing task. Pt benefits form having written information. Therefore, goals of task were written and list made of pt's distractions. Task re-attempted with improved ability. Recommend targeting task again in future session to help pt continue practicing attention to task, mental flexibility and problem solving. Pt left sitting edge of bed, bed alarm on and all needs within reach. Continue per current plan of care.      Pain    Therapy/Group: Individual Therapy  Josue Kass 12/13/2019, 7:28 AM

## 2019-12-13 NOTE — Progress Notes (Signed)
Physical Therapy Session Note  Patient Details  Name: Hailey Hill MRN: [id number] Date of Birth: 1950-11-27  Today's Date: 12/13/2019 PT Individual Time: 0900-1000 PT Individual Time Calculation (min): 60 min   Short Term Goals: Week 2:  PT Short Term Goal 1 (Week 2): =LTG due to ELOS  Skilled Therapeutic Interventions/Progress Updates:    Pt received seated in bed, agreeable to PT session. No complaints of pain this AM. Semi-reclined to sitting EOB at Supervision level. Pt is setup A to doff gripper socks and don shoes while seated EOB. Sit to stand with Supervision to RW. Ambulation x 150 ft with RW at [place] level. Session focus on static and dynamic standing balance. Standing balance with no UE support and min A with multidirectional perturbations. Pt exhibits improved ankle and strategy this date, requires cues for hip strategy when losing balance anteriorly. Pt does have one instance of use of stepping strategy due to LOB anteriorly. Biodex limits of stability level 1 with BUE support and CGA for balance performing multidirectional weight shift following visual cues. Pt scores 10%, 20%, 30%, and then up to 40% accuracy progressively. Ambulation with RW through obstacle course navigating cones and working on turning, CGA for balance. Pt reports she plans to use her rollator at home when performing kitchen tasks. Education with patient that she would not be able to safely use rollator and needs to use RW and have a chair close by for sitting when fatigued. Standing cone taps with alt LE and RW and CGA for balance, progressing from one cone to 3 cones with v/c for LE and color of cone. Ambulation back to room with RW and CGA. Toilet transfer with CGA, setup A for pericare. Pt requests to return to bed at end of session. Sit to semi-reclined with Supervision. Pt left semi-reclined in bed with needs in reach at end of session.  Therapy Documentation Precautions:  Precautions Precautions:  Fall Precaution Comments: posterior bias; R inattention Restrictions Weight Bearing Restrictions: No    Therapy/Group: Individual Therapy   Excell Seltzer, PT, DPT  12/13/2019, 12:30 PM

## 2019-12-14 ENCOUNTER — Ambulatory Visit (HOSPITAL_COMMUNITY): Payer: Medicare HMO | Admitting: Physical Therapy

## 2019-12-14 ENCOUNTER — Encounter (HOSPITAL_COMMUNITY): Payer: Medicare HMO | Admitting: Occupational Therapy

## 2019-12-14 NOTE — Progress Notes (Signed)
Occupational Therapy Session Note  Patient Details  Name: Hailey Hill MRN: [id number] Date of Birth: [birth year]  Today's Date: 12/14/2019 OT Individual Time: 1000-1055 OT Individual Time Calculation (min): 55 min    Short Term Goals: Week 2:  OT Short Term Goal 1 (Week 2): STG=LTG secondary to ELOS  Skilled Therapeutic Interventions/Progress Updates:    Upon entering the room, pt supine in bed with husband present for family education. Pt is agreeable to OT intervention. OT reviewed OT goals, equipment needs, expectations on day of discharge, and follow up [place] recommendation. OT answered questions for both pt and caregiver on this topics. Pt ambulating with close supervision - min guard with caregiver providing cuing and assist for safety. Pt demonstrating transfer onto TTB with min guard this session and OT educated on additional fall risks within the bathroom. They verbalized understanding. Pt ambulating short distance to ADL apartment and demonstrated how BSC would be placed next to bed for safety with toileting at night. Pt practicing with husband without issue. Pt returning back to room in same manner as above and seated on EOB. Call bell within reach and bed alarm activated with husband present in room. Husband and pt verbalize no further questions at this time.    Therapy Documentation Precautions:  Precautions Precautions: Fall Precaution Comments: posterior bias; R inattention Restrictions Weight Bearing Restrictions: No   ADL: ADL Grooming: Setup Where Assessed-Grooming: Sitting at sink Upper Body Bathing: Supervision/safety Where Assessed-Upper Body Bathing: Shower Lower Body Bathing: Moderate assistance Where Assessed-Lower Body Bathing: Shower Upper Body Dressing: Minimal assistance Where Assessed-Upper Body Dressing: Sitting at sink Lower Body Dressing: Minimal assistance Where Assessed-Lower Body Dressing: Wheelchair Toileting: Maximal assistance Where  Assessed-Toileting: Glass blower/designer: Moderate assistance Social research officer, government: Moderate assistance Social research officer, government Method: Heritage manager: Shower seat with back   Therapy/Group: Individual Therapy  Gypsy Decant 12/14/2019, 11:01 AM

## 2019-12-14 NOTE — Progress Notes (Signed)
[place] PHYSICAL MEDICINE & REHABILITATION PROGRESS NOTE   Subjective/Complaints:   Pt reports reading up on her diagnosis on Wikipedia- had no questions or concerns at this point.      ROS: denies SOB, CP, abd pain, HA, vision changes, N/V/D/C.  Objective:   No results found. Recent Labs    12/13/19 0543  WBC 12.1*  HGB 13.4  HCT 39.2  PLT 249   Recent Labs    12/13/19 0543  NA 138  K 4.5  CL 105  CO2 24  GLUCOSE 90  BUN 22  CREATININE 0.80  CALCIUM 9.2    Intake/Output Summary (Last 24 hours) at 12/14/2019 1206 Last data filed at 12/14/2019 0734 Gross per 24 hour  Intake 360 ml  Output --  Net 360 ml     Physical Exam: Vital Signs Blood pressure 118/71, pulse 64, temperature 98.1 F (36.7 C), resp. rate 20, height 5\' 8"  (1.727 m), weight 73.9 kg, SpO2 99 %.  Physical Exam  Vitals reviewed. Constitutional:   Awake, alert, appropriate, unusual affect; wearing winter hat in bed again this AM;   NAD HENT:  Head: Normocephalic and atraumatic.  Eyes: EOM are normal.  Neck: No tracheal deviation present. No thyromegaly present.  CV: RRR Respiratory: .CTA B/L; no W/R/R GI: Soft. NT, ND. (+) BS Musculoskeletal:     Comments: No edema or tenderness in extremities  Neurological: she is alert Dysarthria Motor: Grossly 5/5 throughout Minimal ataxia right upper extremity  Skin: Skin is warm.  Psychiatric: unusual affect; impaired memory; slow to respond.     Assessment/Plan: 1. 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  Physiatrist is providing close team supervision and 24 hour management of active medical problems listed below.  Physiatrist and rehab team continue to assess barriers to discharge/monitor patient progress toward functional and medical  goals  Care Tool:  Bathing  Bathing activity did not occur: Safety/medical concerns Body parts bathed by patient: Left arm, Right arm, Chest, Abdomen, Front perineal area, Buttocks, Right upper leg, Left upper leg, Face   Body parts bathed by helper: Right lower leg, Left lower leg     Bathing assist Assist Level: Minimal Assistance - Patient > 75%     Upper Body Dressing/Undressing Upper body dressing   What is the patient wearing?: Pull over shirt    Upper body assist Assist Level: Minimal Assistance - Patient > 75%    Lower Body Dressing/Undressing Lower body dressing      What is the patient wearing?: Incontinence brief     Lower body assist Assist for lower body dressing: Minimal Assistance - Patient > 75%     Toileting Toileting    Toileting assist Assist for toileting: Moderate Assistance - Patient 50 - 74%     Transfers Chair/bed transfer  Transfers assist  Chair/bed transfer activity did not occur: Safety/medical concerns  Chair/bed transfer assist level: Supervision/Verbal cueing     Locomotion Ambulation   Ambulation assist      Assist level: Contact Guard/Touching assist Assistive device: Walker-rolling Max distance: 150   Walk 10 feet activity   Assist     Assist level: Contact Guard/Touching assist Assistive device: Walker-rolling   Walk 50 feet activity   Assist Walk 50 feet with 2 turns activity did not occur: Safety/medical concerns  Assist level: Contact Guard/Touching  assist Assistive device: Walker-rolling    Walk 150 feet activity   Assist Walk 150 feet activity did not occur: Safety/medical concerns  Assist level: Contact Guard/Touching assist Assistive device: Walker-rolling    Walk 10 feet on uneven surface  activity   Assist Walk 10 feet on uneven surfaces activity did not occur: Safety/medical concerns         Wheelchair     Assist Will patient use wheelchair at discharge?: No Type of  Wheelchair: Manual    Wheelchair assist level: Supervision/Verbal cueing Max wheelchair distance: 150'    Wheelchair 50 feet with 2 turns activity    Assist        Assist Level: Supervision/Verbal cueing   Wheelchair 150 feet activity     Assist      Assist Level: Supervision/Verbal cueing   Blood pressure 118/71, pulse 64, temperature 98.1 F (36.7 C), resp. rate 20, height 5\' 8"  (1.727 m), weight 73.9 kg, SpO2 99 %.  Medical Problem List and Plan: 1. 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 .              -patient may shower             -ELOS/Goals:-13 days/supervision/min A             Continue CIR therapies.   1/30: Patient asks questions about why she was taken off her seizure medications prior to this admission. Explained that since she was seizure free prior to admission her physician likely felt the risks of continued seizure medication outweighed the benefits. Asks the difference between seizure and stroke and I explained to her.  2.  Antithrombotics: -DVT/anticoagulation:  Will add SQ lovenox as 7 days out from bleed and high risk for DVTs             -antiplatelet therapy: N/A 3. Pain Management: tylenol prn.  1/31: denies pain  4. Mood: LCSW to follow for evaluation and support.              -antipsychotic agents: N/a 5. Neuropsych: This patient is not fully capable of making decisions on her own behalf. 6. Skin/Wound Care: Routine pressure relief measures.  7. Fluids/Electrolytes/Nutrition:  Monitor I/O. 8. 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 Cont 5 mg daily until Neuro/Onc follow up.  2/1- will double check on Decadron dosing- since appears it was stopped-will restart at 4 mg PO daily for now, since  5 mg is unusual dose.  2/3- will need f/u with Dr Isidore Moos after d/c and discuss radiation therapy.   9. Acute  AKI: Encourage fluid intake to help resolve dehydration.  1/28- BUN is 24 and Cr is 0.94  2/4- labs in AM 10. Leucocytosis: Monitor for signs of infection. Will order UCS to monitor for efficacy of treatment.             1/28- WBC down to 11.4 from 12.3- f/u U/A negative- likely from decadron.   2/1- WBC 11.5- U/A was negative; no other signs of infection, will monitor  2/4- labs in AM  2/5- WBC 12.1- still on Decadron- afebrile; con't to monitor for signs of infection 11. Orthostatic hypotension  2/4- BP soft this AM- will con't regimen and monitor for signs of OH 12. Dispo  2/2- will call Husband  today to let him know how pt's doing after team conference.   2/3- talked with son 2/2- spent 25 minutes on phone call discussing prognosis and plan for more radiation therapy- need appointment with Dr Isidore Moos- suggest family set up.     LOS: 10 days A FACE TO FACE EVALUATION WAS PERFORMED  Jakaleb Payer 12/14/2019, 12:06 PM

## 2019-12-14 NOTE — Progress Notes (Signed)
Physical Therapy Session Note  Patient Details  Name: Hailey Hill MRN: [id number] Date of Birth: January 06, 1951  Today's Date: 12/14/2019 PT Individual Time: 1100-1200 PT Individual Time Calculation (min): 60 min   Short Term Goals: Week 2:  PT Short Term Goal 1 (Week 2): =LTG due to ELOS  Skilled Therapeutic Interventions/Progress Updates:    Pt received seated in bed, agreeable to PT session. No complaints of pain. Pt's husband [name] present for hands-on family education session. Pt is at Supervision level for bed mobility and sit to stand transfers to RW from bed. Ambulation x 150 ft with RW and CGA for balance, v/c to attend to task as pt is easily distracted and exhibits decreased balance when distracted. Ascend/descend 4 stairs laterally with R handrail and CGA, v/c for safety. Pt's husband is able to perform return demo of providing CGA for gait with RW and CGA on stairs. Discussed RW placement at top/bottom of stairs and sequencing of stairs in patient's home environment. Car transfer with CGA and RW with v/c for safe transfer technique. Pt's husband demos good safety awareness and ability to provide cueing to patient. Standing balance with no UE support performing rebounder 4 x 15 reps with gradually narrowed BOS and increased distance to throw and catch ball from, min to mod A overall for balance. Pt requires increased assist with onset of fatigue and exhibits anterior bias. Pt exhibits poor awareness of near LOB and therefore poor ability to correct. Sit to stand x 5 reps with no AD and BUE support on mat table with CGA, 2 x 5 reps with BUE on therapy ball and no UE support with standing with min A for balance and safety. Ambulation back to patient room with RW and CGA. Sit to semi-reclined in bed with Supervision. Pt left semi-reclined in bed with needs in reach, bed alarm in place, husband present at end of session.  Therapy Documentation Precautions:  Precautions Precautions:  Fall Precaution Comments: posterior bias; R inattention Restrictions Weight Bearing Restrictions: No    Therapy/Group: Individual Therapy   Excell Seltzer, PT, DPT  12/14/2019, 12:44 PM

## 2019-12-15 MED ORDER — LEVETIRACETAM 100 MG/ML PO SOLN
500.0000 mg | Freq: Two times a day (BID) | ORAL | Status: DC
  Administered 2019-12-15 – 2019-12-18 (×6): 500 mg via ORAL
  Filled 2019-12-15 (×6): qty 5

## 2019-12-15 NOTE — Progress Notes (Signed)
[facility] PHYSICAL MEDICINE & REHABILITATION PROGRESS NOTE   Subjective/Complaints:  Pt reports she's emphatic that she won't take Keppra unless completely dissolved- explained can change to liquid- she was very excited about it and will do so for additional doses.    Feels sick at the IDEA of taking the Keppra pill this AM.   ROS: denies SOB, CP, abd pain, HA, vision changes, N/V/D/C.  Objective:   No results found. Recent Labs    12/13/19 0543  WBC 12.1*  HGB 13.4  HCT 39.2  PLT 249   Recent Labs    12/13/19 0543  NA 138  K 4.5  CL 105  CO2 24  GLUCOSE 90  BUN 22  CREATININE 0.80  CALCIUM 9.2    Intake/Output Summary (Last 24 hours) at 12/15/2019 1357 Last data filed at 12/15/2019 0730 Gross per 24 hour  Intake 360 ml  Output -  Net 360 ml     Physical Exam: Vital Signs Blood pressure 102/62, pulse 81, temperature 98.6 F (37 C), temperature source Oral, resp. rate 18, height 5\' 8"  (1.727 m), weight 73.9 kg, SpO2 97 %.  Physical Exam  Vitals reviewed. Constitutional:   Awake, alert, appropriate, unusual affect; wearing winter hat in bed again this AM;   Very upset about Keppra pill; nursing in room to help give meds;NAD HENT:  Head: Normocephalic and atraumatic.  Eyes: EOM are normal.  Neck: No tracheal deviation present. No thyromegaly present.  CV: RRR Respiratory: .CTA B/L; no W/R/R GI: Soft. NT, ND. (+) BS Musculoskeletal:     Comments: No edema or tenderness in extremities  Neurological: she is alert Dysarthria Motor: Grossly 5/5 throughout Minimal ataxia right upper extremity  Skin: Skin is warm.  Psychiatric: unusual affect; impaired memory; slow to respond. Upset about kepra pill;     Assessment/Plan: 1. 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  Physiatrist is providing close team supervision and 24 hour management of active medical problems listed below.  Physiatrist and rehab team continue to assess barriers to discharge/monitor patient progress toward functional and medical goals  Care Tool:  Bathing  Bathing activity did not occur: Safety/medical concerns Body parts bathed by patient: Left arm, Right arm, Chest, Abdomen, Front perineal area, Buttocks, Right upper leg, Left upper leg, Face   Body parts bathed by helper: Right lower leg, Left lower leg     Bathing assist Assist Level: Minimal Assistance - Patient > 75%     Upper Body Dressing/Undressing Upper body dressing   What is the patient wearing?: Pull over shirt    Upper body assist Assist Level: Minimal Assistance - Patient > 75%    Lower Body Dressing/Undressing Lower body dressing      What is the patient wearing?: Incontinence brief     Lower body assist Assist for lower body dressing: Minimal Assistance - Patient > 75%     Toileting Toileting    Toileting assist Assist for toileting: Moderate Assistance - Patient 50 - 74%     Transfers Chair/bed transfer  Transfers assist  Chair/bed transfer activity did not occur: Safety/medical concerns  Chair/bed transfer assist level: Supervision/Verbal cueing     Locomotion Ambulation   Ambulation assist      Assist level: Contact Guard/Touching assist Assistive device: Walker-rolling Max distance: 150   Walk 10 feet  activity   Assist     Assist level: Contact Guard/Touching assist Assistive device: Walker-rolling   Walk 50 feet activity   Assist Walk 50 feet with 2 turns activity did not occur: Safety/medical concerns  Assist level: Contact Guard/Touching assist Assistive device: Walker-rolling    Walk 150 feet activity   Assist Walk 150 feet activity did not occur: Safety/medical concerns  Assist level: Contact Guard/Touching assist Assistive device:  Walker-rolling    Walk 10 feet on uneven surface  activity   Assist Walk 10 feet on uneven surfaces activity did not occur: Safety/medical concerns         Wheelchair     Assist Will patient use wheelchair at discharge?: No Type of Wheelchair: Manual    Wheelchair assist level: Supervision/Verbal cueing Max wheelchair distance: 150'    Wheelchair 50 feet with 2 turns activity    Assist        Assist Level: Supervision/Verbal cueing   Wheelchair 150 feet activity     Assist      Assist Level: Supervision/Verbal cueing   Blood pressure 102/62, pulse 81, temperature 98.6 F (37 C), temperature source Oral, resp. rate 18, height 5\' 8"  (1.727 m), weight 73.9 kg, SpO2 97 %.  Medical Problem List and Plan: 1. 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 .              -patient may shower             -ELOS/Goals:-13 days/supervision/min A             Continue CIR therapies.   1/30: Patient asks questions about why she was taken off her seizure medications prior to this admission. Explained that since she was seizure free prior to admission her physician likely felt the risks of continued seizure medication outweighed the benefits. Asks the difference between seizure and stroke and I explained to her.   2/7- changed Keppra to liquid 500 mg BID- should work better 2.  Antithrombotics: -DVT/anticoagulation:  Will add SQ lovenox as 7 days out from bleed and high risk for DVTs             -antiplatelet therapy: N/A 3. Pain Management: tylenol prn.  1/31: denies pain  4. Mood: LCSW to follow for evaluation and support.              -antipsychotic agents: N/a 5. Neuropsych: This patient is not fully capable of making decisions on her own behalf. 6. Skin/Wound Care: Routine  pressure relief measures.  7. Fluids/Electrolytes/Nutrition:  Monitor I/O. 8. 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 Cont 5 mg daily until Neuro/Onc follow up.  2/1- will double check on Decadron dosing- since appears it was stopped-will restart at 4 mg PO daily for now, since 5 mg is unusual dose.  2/3- will need f/u with Dr Isidore Moos after d/c and discuss radiation therapy.   9. Acute  AKI: Encourage fluid intake to help resolve dehydration.  1/28- BUN is 24 and Cr is 0.94  2/4- labs in AM 10. Leucocytosis: Monitor for signs of infection. Will order UCS to monitor for efficacy of treatment.             1/28- WBC down to 11.4 from 12.3- f/u U/A negative- likely from decadron.   2/1-  WBC 11.5- U/A was negative; no other signs of infection, will monitor  2/4- labs in AM  2/5- WBC 12.1- still on Decadron- afebrile; con't to monitor for signs of infection 11. Orthostatic hypotension  2/4- BP soft this AM- will con't regimen and monitor for signs of OH 12. Dispo  2/2- will call Husband today to let him know how pt's doing after team conference.   2/3- talked with son 2/2- spent 25 minutes on phone call discussing prognosis and plan for more radiation therapy- need appointment with Dr Isidore Moos- suggest family set up.     LOS: 11 days A FACE TO FACE EVALUATION WAS PERFORMED  Genae Strine 12/15/2019, 1:57 PM

## 2019-12-16 ENCOUNTER — Inpatient Hospital Stay (HOSPITAL_COMMUNITY): Payer: Medicare HMO | Admitting: Speech Pathology

## 2019-12-16 ENCOUNTER — Inpatient Hospital Stay (HOSPITAL_COMMUNITY): Payer: Medicare HMO

## 2019-12-16 ENCOUNTER — Inpatient Hospital Stay (HOSPITAL_COMMUNITY): Payer: Medicare HMO | Admitting: Physical Therapy

## 2019-12-16 LAB — CBC
HCT: 38.6 % (ref 36.0–46.0)
Hemoglobin: 13.2 g/dL (ref 12.0–15.0)
MCH: 30.7 pg (ref 26.0–34.0)
MCHC: 34.2 g/dL (ref 30.0–36.0)
MCV: 89.8 fL (ref 80.0–100.0)
Platelets: 251 10*3/uL (ref 150–400)
RBC: 4.3 MIL/uL (ref 3.87–5.11)
RDW: 13 % (ref 11.5–15.5)
WBC: 9.8 10*3/uL (ref 4.0–10.5)
nRBC: 0 % (ref 0.0–0.2)

## 2019-12-16 LAB — BASIC METABOLIC PANEL
Anion gap: 15 (ref 5–15)
BUN: 19 mg/dL (ref 8–23)
CO2: 24 mmol/L (ref 22–32)
Calcium: 9.2 mg/dL (ref 8.9–10.3)
Chloride: 99 mmol/L (ref 98–111)
Creatinine, Ser: 0.8 mg/dL (ref 0.44–1.00)
GFR calc Af Amer: >60 mL/min (ref >60)
GFR calc non Af Amer: >60 mL/min (ref >60)
Glucose, Bld: 92 mg/dL (ref 70–99)
Potassium: 3.9 mmol/L (ref 3.5–5.1)
Sodium: 138 mmol/L (ref 135–145)

## 2019-12-16 NOTE — Progress Notes (Addendum)
Social Work Patient ID: [name], female   DOB: 1951-10-27, 69 y.o.   MRN: [id number]   SW followed up with pt son [name] to follow up about  recommended DME: RW, BSC,and TTB, and if they have these items. Will follow-up with his dad and inform SW.   *SW received return phone call from pt son [name] [phone]) who reports pt will need RW and BSC, and will order TTB. SW suggested purchasing TTB online. SW discussed 24/7 care needs for pt. Reports that his dad intends to take off time to support pt after caregiver education this weekend; and will use his PTO and does not FMLA forms completed at this time.  SW ordered DME: RW and 3i1n BSC through [facility] [phone]). Pt will have [place] with [facility] for HHPT/OT/ST.  [name], MSW, [place]  Office (preferred): [phone] Cell: [phone] Fax: [phone]

## 2019-12-16 NOTE — Progress Notes (Signed)
Occupational Therapy Session Note  Patient Details  Name: SYBOL MORRE MRN: [id number] Date of Birth: 1951-04-05  Today's Date: 12/16/2019 OT Individual Time: 1300-1330 OT Individual Time Calculation (min): 30 min    Short Term Goals: Week 2:  OT Short Term Goal 1 (Week 2): STG=LTG secondary to ELOS  Skilled Therapeutic Interventions/Progress Updates:    OT intervention with focus on funcitonal amb with RW, toileting, standing balance, and safety awareness to increase independence with BADLs.  Pt requrested to use toilet and amb with RW to bathroom and completed totileting tasks with CGA. Pt returned to room and stood at sink to wash hands prior to amb out into hallway.  Pt with LOB X 1 when leaning forward to close bathroom door.  Pt required min A to recover.  Discussed safety awareness and balance reactions.  Pt recognizes each LOB but reaction is delayed and pt unable to self correct. Pt verbalizes understanding. Pt required mod verbal cues during session to attend to tasks.  Pt stood at [place] for 4-1 min sessions.  Pt easily distracted by others in room and required verbal cues to redirect. Pt returned to room and remained in recliner with all needs withn reach.  Belt alarm activated.   Therapy Documentation Precautions:  Precautions Precautions: Fall Precaution Comments: posterior bias; R inattention Restrictions Weight Bearing Restrictions: No  Pain:  Pt with no c/o of pain this afternoon  Therapy/Group: Individual Therapy  [name] 12/16/2019, 2:46 PM

## 2019-12-16 NOTE — Progress Notes (Signed)
Occupational Therapy Session Note  Patient Details  Name: Hailey Hill MRN: [id number] Date of Birth: July 07, 1951  Today's Date: 12/16/2019 OT Individual Time: 5038-8828 OT Individual Time Calculation (min): 75 min    Short Term Goals: Week 2:  OT Short Term Goal 1 (Week 2): STG=LTG secondary to ELOS  Skilled Therapeutic Interventions/Progress Updates:    Pt resting in recliner upon arrival and "ready for therapy." Pt requested to use toilet and change pads in underwear. Amb with RW (CGA) and toileting with CGA when standing.  OT intervention with focus on funcitonal amb with RW, standing balance, activity tolerance, and safety awareness.  Pt engaged in standing activities on compliant and noncompliant surfaces. After amb to gym, pt preparing to sit in chair with significant LOB requiring max A to correct.  Pt stated she thought her pants were falling down and she was trying to fix them.  Discussed LOB and safety with pt.  Pt verbalized understanding.  Pt required CGA for dynamic standing tasks on both compliant and noncompliant surfaces. Pt amb with RW to day room and rested in chair before returning to her room. CGA with all ambulation.  Pt continues to exhibit occasional LOB when making turns. Pt returned to recliner.  Belt alarm activated.  All needs within reach.   Therapy Documentation Precautions:  Precautions Precautions: Fall Precaution Comments: posterior bias; R inattention Restrictions Weight Bearing Restrictions: No   Pain:   Pt denies pain this morning  Therapy/Group: Individual Therapy  [name] 12/16/2019, 11:49 AM

## 2019-12-16 NOTE — Progress Notes (Signed)
Speech Language Pathology Daily Session Note  Patient Details  Name: Hailey Hill MRN: [id number] Date of Birth: April 23, 1951  Today's Date: 12/16/2019 SLP Individual Time: 0940-7680 SLP Individual Time Calculation (min): 55 min  Short Term Goals: Week 2: SLP Short Term Goal 1 (Week 2): STGs= LTGs d/t short remaining length of stay  Skilled Therapeutic Interventions: Pt was seen for skilled ST intervention targeting goals for improved cognition. Pt was pleasant and cooperative with unfamiliar therapist. SLP facilitated session by engaging pt in conversation about her goals in PT/OT/ST during her rehab stay, to increase functional recall, and awareness of deficits. Pt was able to discuss activities completed in PT/OT, with verbalization of needing to take "baby steps" and "SLOW DOWN". Pt was able to recall specifics of her cancer treatment, vertigo, recent changes in ambulation that led to the discovery of the cerebellar lesions. She discussed family members and pets, frequently becoming tearful. She demonstrated good attention to task and detail. Pt verbalized fair problem solving given situations she may find herself here in rehab - apparently she did not want to "bother" the nursing staff overnight with toileting, but waited until breakfast time - at which point she needed to be cleaned and redressed, and bed linens replaced. SLP provided gentle encouragement and problem solving with pt to avoid this in the future.   Pt was left in recliner with alarm on, all needs within reach. Continue ST per current plan of care.   Pain Pain Assessment Pain Scale: 0-10 Pain Score: 0-No pain  Therapy/Group: Individual Therapy   Citlally Captain B. Quentin Ore, [facility], CCC-SLP Speech Language Pathologist  Shonna Chock 12/16/2019, 3:56 PM

## 2019-12-16 NOTE — Progress Notes (Signed)
Physical Therapy Session Note  Patient Details  Name: Hailey Hill MRN: [id number] Date of Birth: [birth year]  Today's Date: 12/16/2019 PT Individual Time: 0800-0845 PT Individual Time Calculation (min): 45 min   Short Term Goals: Week 2:  PT Short Term Goal 1 (Week 2): =LTG due to ELOS  Skilled Therapeutic Interventions/Progress Updates:    Pt received seated in recliner in room, agreeable to PT session. No complaints of pain. Sit to stand with Supervision to RW throughout session. Ambulation x 150 ft with RW and CGA for balance down to therapy apartment. Pt requests to practice bed to [facility] transfers. Sit to/from supine on real bed in simulation apartment at Supervision level. Sit to stand with Supervision to RW. Stand pivot transfer to [facility] with RW and Supervision. Discussed sequencing of transfer and safe setup of BSC in home environment. Discussed pt having her family empty commode as she is not able to safely balance with RW and carry commode bucket, pt agreeable. Standing balance on Biodex, limits of stability level 1 x 3 reps with BUE support and CGA to min A for standing balance. Pt scores 53%, 54%, and 50% accuracy at leaning outside BOS following visual cues on Biodex machine. Pt left seated in recliner in room with needs in reach at end of session, quick release belt in place.  Therapy Documentation Precautions:  Precautions Precautions: Fall Precaution Comments: posterior bias; R inattention Restrictions Weight Bearing Restrictions: No    Therapy/Group: Individual Therapy   Excell Seltzer, PT, DPT  12/16/2019, 12:40 PM

## 2019-12-16 NOTE — Progress Notes (Signed)
[place] PHYSICAL MEDICINE & REHABILITATION PROGRESS NOTE   Subjective/Complaints:  No complaints this morning. Denies pain. Ambulated well with PT in hallway.   ROS: denies SOB, CP, abd pain, HA, vision changes, N/V/D/C.  Objective:   No results found. Recent Labs    12/16/19 0528  WBC 9.8  HGB 13.2  HCT 38.6  PLT 251   Recent Labs    12/16/19 0528  NA 138  K 3.9  CL 99  CO2 24  GLUCOSE 92  BUN 19  CREATININE 0.80  CALCIUM 9.2    Intake/Output Summary (Last 24 hours) at 12/16/2019 1126 Last data filed at 12/16/2019 0800 Gross per 24 hour  Intake 440 ml  Output --  Net 440 ml     Physical Exam: Vital Signs Blood pressure 114/74, pulse 68, temperature 98.7 F (37.1 C), temperature source Oral, resp. rate 18, height 5\' 8"  (1.727 m), weight 73.9 kg, SpO2 98 %.  Physical Exam  Vitals reviewed. Constitutional:   Awake, alert, appropriate, unusual affect; wearing winter hat in chair this morning. Seen ambulating with PT in hallway earlier this morning.  HENT:  Head: Normocephalic and atraumatic.  Eyes: EOM are normal.  Neck: No tracheal deviation present. No thyromegaly present.  CV: RRR Respiratory: .CTA B/L; no W/R/R GI: Soft. NT, ND. (+) BS Musculoskeletal:     Comments: No edema or tenderness in extremities  Neurological: she is alert Dysarthria Motor: Grossly 5/5 throughout Minimal ataxia right upper extremity  Skin: Skin is warm.  Psychiatric: unusual affect; impaired memory; slow to respond. Upset about kepra pill;     Assessment/Plan: 1. 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  Physiatrist is providing close team supervision and 24 hour management of active medical problems listed below.  Physiatrist and rehab team continue to assess  barriers to discharge/monitor patient progress toward functional and medical goals  Care Tool:  Bathing  Bathing activity did not occur: Safety/medical concerns Body parts bathed by patient: Left arm, Right arm, Chest, Abdomen, Front perineal area, Buttocks, Right upper leg, Left upper leg, Face   Body parts bathed by helper: Right lower leg, Left lower leg     Bathing assist Assist Level: Minimal Assistance - Patient > 75%     Upper Body Dressing/Undressing Upper body dressing   What is the patient wearing?: Pull over shirt    Upper body assist Assist Level: Minimal Assistance - Patient > 75%    Lower Body Dressing/Undressing Lower body dressing      What is the patient wearing?: Incontinence brief     Lower body assist Assist for lower body dressing: Minimal Assistance - Patient > 75%     Toileting Toileting    Toileting assist Assist for toileting: Moderate Assistance - Patient 50 - 74%     Transfers Chair/bed transfer  Transfers assist  Chair/bed transfer activity did not occur: Safety/medical concerns  Chair/bed transfer assist level: Supervision/Verbal cueing     Locomotion Ambulation   Ambulation assist      Assist level: Contact Guard/Touching assist Assistive device: Walker-rolling Max distance: 150   Walk 10 feet activity   Assist     Assist level: Contact Guard/Touching assist Assistive device: Walker-rolling   Walk 50 feet activity   Assist Walk 50 feet with 2 turns activity did not occur: Safety/medical concerns  Assist level: Contact Guard/Touching assist Assistive device: Walker-rolling    Walk 150 feet activity   Assist Walk 150 feet activity did not occur: Safety/medical concerns  Assist level: Contact Guard/Touching assist Assistive device: Walker-rolling    Walk 10 feet on uneven surface  activity   Assist Walk 10 feet on uneven surfaces activity did not occur: Safety/medical concerns          Wheelchair     Assist Will patient use wheelchair at discharge?: No Type of Wheelchair: Manual    Wheelchair assist level: Supervision/Verbal cueing Max wheelchair distance: 150'    Wheelchair 50 feet with 2 turns activity    Assist        Assist Level: Supervision/Verbal cueing   Wheelchair 150 feet activity     Assist      Assist Level: Supervision/Verbal cueing   Blood pressure 114/74, pulse 68, temperature 98.7 F (37.1 C), temperature source Oral, resp. rate 18, height 5\' 8"  (1.727 m), weight 73.9 kg, SpO2 98 %.  Medical Problem List and Plan: 1. 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 .              -patient may shower             -ELOS/Goals:-13 days/supervision/min A             Continue CIR therapies.   1/30: Patient asks questions about why she was taken off her seizure medications prior to this admission. Explained that since she was seizure free prior to admission her physician likely felt the risks of continued seizure medication outweighed the benefits. Asks the difference between seizure and stroke and I explained to her.   2/7- changed Keppra to liquid 500 mg BID- should work better  2/8: no complaints regarding Keppra today.  2.  Antithrombotics: -DVT/anticoagulation:  Will add SQ lovenox as 7 days out from bleed and high risk for DVTs             -antiplatelet therapy: N/A 3. Pain Management: tylenol prn.  1/31: denies pain   2/8: denies pain 4. Mood: LCSW to follow for evaluation and support.              -antipsychotic agents: N/a 5. Neuropsych: This patient is not fully capable of making decisions on her own behalf. 6. Skin/Wound Care: Routine pressure relief measures.  7. Fluids/Electrolytes/Nutrition:  Monitor I/O. 8. 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 Cont 5 mg daily until Neuro/Onc follow up.  2/1- will double check on Decadron dosing- since appears it was stopped-will restart at 4 mg PO daily for now, since 5 mg is unusual dose.  2/3- will need f/u with Dr Isidore Moos after d/c and discuss radiation therapy.   9. Acute  AKI: Encourage fluid intake to help resolve dehydration.  1/28- BUN is 24 and Cr is 0.94  2/4- labs in AM 10. Leucocytosis: Monitor for signs of infection. Will order UCS to monitor for efficacy of treatment.             1/28- WBC down to 11.4 from 12.3- f/u U/A negative- likely from decadron.   2/1- WBC 11.5- U/A was negative; no other signs of infection, will monitor  2/4- labs in AM  2/5- WBC 12.1- still on Decadron- afebrile; con't  to monitor for signs of infection  2/8: WBC 9.8; stable.  11. Orthostatic hypotension  2/4- BP soft this AM- will con't regimen and monitor for signs of OH  2/8: BP stable.  12. Dispo  2/2- will call Husband today to let him know how pt's doing after team conference.   2/3- talked with son 2/2- spent 25 minutes on phone call discussing prognosis and plan for more radiation therapy- need appointment with Dr Isidore Moos- suggest family set up.     LOS: 12 days A FACE TO FACE EVALUATION WAS PERFORMED  Martha Clan P Marshay Slates 12/16/2019, 11:26 AM

## 2019-12-16 NOTE — Plan of Care (Signed)
  Problem: Consults Goal: RH BRAIN INJURY PATIENT EDUCATION Description: Description: See Patient Education module for eduction specifics Outcome: Progressing   Problem: RH BOWEL ELIMINATION Goal: RH STG MANAGE BOWEL WITH ASSISTANCE Description: STG Manage Bowel with mod I Assistance. Outcome: Progressing Goal: RH STG MANAGE BOWEL W/MEDICATION W/ASSISTANCE Description: STG Manage Bowel with Medication with mod I Assistance. Outcome: Progressing   Problem: RH BLADDER ELIMINATION Goal: RH STG MANAGE BLADDER WITH ASSISTANCE Description: STG Manage Bladder With mod I Assistance Outcome: Progressing

## 2019-12-17 ENCOUNTER — Inpatient Hospital Stay (HOSPITAL_COMMUNITY): Payer: Medicare HMO | Admitting: Physical Therapy

## 2019-12-17 ENCOUNTER — Inpatient Hospital Stay (HOSPITAL_COMMUNITY): Payer: Medicare HMO

## 2019-12-17 ENCOUNTER — Inpatient Hospital Stay (HOSPITAL_COMMUNITY): Payer: Medicare HMO | Admitting: Speech Pathology

## 2019-12-17 ENCOUNTER — Other Ambulatory Visit: Payer: Self-pay | Admitting: Radiation Therapy

## 2019-12-17 NOTE — Progress Notes (Signed)
Physical Therapy Session Note  Patient Details  Name: Hailey Hill MRN: [id number] Date of Birth: [birth year]  Today's Date: 12/17/2019 PT Individual Time: 0901-0932 PT Individual Time Calculation (min): 31 min   Short Term Goals: Week 2:  PT Short Term Goal 1 (Week 2): =LTG due to ELOS  Skilled Therapeutic Interventions/Progress Updates: Pt presented in recliner agreeable to therapy. Performed stand to RW with supervision and increased time. Pt ambulated to ortho gym close S to car. Performed car transfer with close S with pt verbalizing not to hold onto door and to back into seat. Pt then ambulated to ADL apt and performed furniture transfer with CGA and verbal cues to push with LUE (raised/firmer surface). Pt required x 2 attempts to come to full standing and was easily distracted while performing transfer. Performed gait on uneven surface with minA and increased time due to increased posterior and lateral sway. Pt required re-direction due to being externally distracted however once instructed to tend to task improved balance on second bout of ambulation. Pt ambulated back to room CGA with x 2 occurences of L lateral sway but was able to self correct. Once back in room pt returned to recliner and left with belt alarm on, call bell within reach and needs met.      Therapy Documentation Precautions:  Precautions Precautions: Fall Precaution Comments: posterior bias; R inattention Restrictions Weight Bearing Restrictions: No General:   Vital Signs:   Pain: Pain Assessment Pain Scale: 0-10 Pain Score: 0-No pain   Therapy/Group: Individual Therapy  Dymond Spreen  Judson Tsan, PTA  12/17/2019, 12:33 PM

## 2019-12-17 NOTE — Progress Notes (Signed)
Physical Therapy Session Note  Patient Details  Name: Hailey Hill MRN: [id number] Date of Birth: 11/14/1950  Today's Date: 12/17/2019 PT Individual Time: 1008-1031 PT Individual Time Calculation (min): 23 min   Short Term Goals: Week 2:  PT Short Term Goal 1 (Week 2): =LTG due to ELOS  Skilled Therapeutic Interventions/Progress Updates:  Pt received in recliner & agreeable to tx. Pt transfers sit<>stand with supervision with only min cuing for safe hand placement to push up from stable surface. Pt ambulates room<>gym with RW & CGA with shuffled gait pattern, decreased weight shifting R, decreased R step length, and decreased RLE heel strike with pt reporting "I have to take baby steps" but therapist providing education re: increased step length BLE. In gym, pt is able to recall method in which she should negotiate stairs with questioning cues initially but then requiring cuing at top step. Pt negotiates 4 steps laterally with R rail with CGA. At end of session pt left in recliner with chair alarm donned & call bell, all needs in reach.  Therapy Documentation Precautions:  Precautions Precautions: Fall Precaution Comments: posterior bias; R inattention Restrictions Weight Bearing Restrictions: No  Pain: Pt denies c/o pain   Therapy/Group: Individual Therapy  Waunita Schooner 12/17/2019, 10:47 AM

## 2019-12-17 NOTE — Progress Notes (Signed)
Occupational Therapy Discharge Summary  Patient Details  Name: Hailey Hill MRN: [id number] Date of Birth: 01/07/1951  Patient has met 54 of 11 long term goals due to improved activity tolerance, improved balance, ability to compensate for deficits, improved attention, improved awareness and improved coordination.  Pt made steady progress with BADLs and functional transfers during this admission.  Pt completes all BADLs with supervision.  Pt requires min verbal cues for safety awareness and redirection to task, especially when ambulating. Pt's husband has been present and participated in therapy.  Pt's husband provides appropriate level of supervision. Patient to discharge at overall Supervision level.  Patient's care partner is independent to provide the necessary supervision assistance at discharge.    Recommendation:  Patient will benefit from ongoing skilled OT services in home health setting to continue to advance functional skills in the area of BADL and iADL.  Equipment: BSC  Reasons for discharge: treatment goals met  Patient/family agrees with progress made and goals achieved: Yes  OT Discharge Vision Baseline Vision/History: No visual deficits Patient Visual Report: No change from baseline Vision Assessment?: Yes Eye Alignment: Impaired (comment) Tracking/Visual Pursuits: Right eye does not track laterally;Decreased smoothness of horizontal tracking;Unable to hold eye position out of midline Saccades: Within functional limits Visual Fields: No apparent deficits Depth Perception: Overshoots Perception  Perception: Impaired Inattention/Neglect: (mild R inattention) Praxis Praxis: Impaired Praxis Impairment Details: Perseveration Cognition Overall Cognitive Status: Impaired/Different from baseline Arousal/Alertness: Awake/alert Orientation Level: Oriented X4 Attention: Selective Sustained Attention: Appears intact Selective Attention: Impaired Selective Attention  Impairment: Functional complex;Verbal complex Memory: Impaired Memory Impairment: Decreased recall of new information;Decreased short term memory Decreased Short Term Memory: Verbal basic;Functional basic Immediate Memory Recall: Sock;Blue;Bed Memory Recall Sock: With Cue Memory Recall Blue: With Cue Memory Recall Bed: Not able to recall Awareness: Impaired Awareness Impairment: Emergent impairment Problem Solving: Impaired Problem Solving Impairment: Verbal complex;Functional complex Executive Function: Reasoning Reasoning: Impaired Reasoning Impairment: Functional basic;Verbal complex Behaviors: Impulsive;Perseveration Safety/Judgment: Impaired [employer] Scales of Cognitive Functioning: Purposeful/appropriate Sensation Sensation Light Touch: Appears Intact Proprioception: Appears Intact Coordination Gross Motor Movements are Fluid and Coordinated: No Fine Motor Movements are Fluid and Coordinated: No Coordination and Movement Description: impaired 2/2 balance deficits Finger Nose Finger Test: overshoots with RUE 9 Hole Peg Test: unable to complete with RUE Motor  Motor Motor: Within Functional Limits Motor - Discharge Observations: [facility] Mobility  Bed Mobility Bed Mobility: Rolling Right;Rolling Left;Supine to Sit;Sit to Supine Rolling Right: Supervision/verbal cueing Rolling Left: Supervision/Verbal cueing Supine to Sit: Supervision/Verbal cueing Sit to Supine: Supervision/Verbal cueing Transfers Sit to Stand: Supervision/Verbal cueing Stand to Sit: Supervision/Verbal cueing  Trunk/Postural Assessment  Cervical Assessment Cervical Assessment: Within Functional Limits Thoracic Assessment Thoracic Assessment: (flexed posture) Lumbar Assessment Lumbar Assessment: (posterior pelvic tilt) Postural Control Postural Control: Deficits on evaluation Trunk Control: decreased in standing Protective Responses: delayed and inadequate  Balance Balance Balance  Assessed: Yes Static Sitting Balance Static Sitting - Level of Assistance: 5: Stand by assistance Dynamic Sitting Balance Dynamic Sitting - Level of Assistance: 5: Stand by assistance Static Standing Balance Static Standing - Level of Assistance: 4: Min assist;5: Stand by assistance Dynamic Standing Balance Dynamic Standing - Level of Assistance: 4: Min assist Extremity/Trunk Assessment RUE Assessment RUE Assessment: Within Functional Limits LUE Assessment LUE Assessment: Within Functional Limits   [name] 12/17/2019, 3:09 PM

## 2019-12-17 NOTE — Patient Care Conference (Signed)
Inpatient RehabilitationTeam Conference and Plan of Care Update Date: 12/17/2019   Time: 11:10 AM    Patient Name: Hailey Hill      Medical Record Number: [id number]  Date of Birth: [birth year] Sex: Female         Room/Bed: 4M07C/4M07C-01 Payor Info: Payor: [employer] MEDICARE / Plan: [place] HMO / Product Type: *No Product type* /    Admit Date/Time:  12/04/2019  3:55 PM  Primary Diagnosis:  Lung cancer metastatic to brain [facility])  Patient Active Problem List   Diagnosis Date Noted  . Adjustment disorder with anxious mood   . Palliative care by specialist   . DNR (do not resuscitate) discussion   . Lung cancer metastatic to brain ([place]) 12/04/2019  . Closed fracture of temporal bone ([place])   . Leukocytosis   . AKI (acute kidney injury) ([place])   . Intracranial hemorrhage, nontraumatic ([place]) 11/27/2019  . Acute lower UTI 11/27/2019  . Fall 07/10/2019  . Closed fracture of head of left humerus   . Metastatic lung cancer (metastasis from lung to other site) ([place]) 07/07/2019  . Fracture, humerus, anatomical neck, left, closed, initial encounter 07/07/2019  . Metastatic cancer to brain ([place]) 07/07/2019  . Chest pain with moderate risk for cardiac etiology 12/14/2015  . Small cell lung cancer ([place]) 09/16/2013    Expected Discharge Date: Expected Discharge Date: 12/18/19  Team Members Present: Physician leading conference: Dr. Courtney Heys Social Worker Present: Lennart Pall, LCSW(Auria Barbra Sarks, LCSW) Nurse Present: Mohammed Kindle, RN Case Manager: Karene Fry, RN PT Present: Excell Seltzer, PT OT Present: Willeen Cass, OT;Roanna Epley, COTA SLP Present: Jettie Booze, CF-SLP PPS Coordinator present : Ileana Ladd, Burna Mortimer, SLP     Current Status/Progress Goal Weekly Team Focus  Bowel/Bladder   Continent of bowel and bladder, somtimes incontinent of bladder, LBM 12/15/19  Encourage continence of bladder  Toileting as needed   Swallow/Nutrition/ Hydration              ADL's   bathing/dressing-close supervision; toileting-supervision, functional tranfsers-supervicion; decreased safety awareness  supervision overall  safety educaiton, functional tranfser, discharge planning   Mobility   Supervision bed mobility, Supervision transfers with RW, gait Supervision to CGA with RW up to 150 ft, CGA stairs  Supervision to CGA overall at ambulatory level  family education, d/c planning, safety awareness, dynamic standing balance   Communication             Safety/Cognition/ Behavioral Observations  Min A for higher level cognition  supervision  continue functional tasks for higher level attention, recall, awareness of deficits, and functional problem solving.   Pain   No c/o pain  Pain free  Assess pain every shift / PRN   Skin   Skin intact, ecchymosis to BUE  Prevent skin breakdown  Assess skin every shift/ PRN    Rehab Goals Patient on target to meet rehab goals: Yes *See Care Plan and progress notes for long and short-term goals.     Barriers to Discharge  Current Status/Progress Possible Resolutions Date Resolved   Nursing                  PT  Behavior;Pending chemo/radiation                 OT                  SLP     recommend 24 hour supervision  SW                Discharge Planning/Teaching Needs:  D/c to home with 24/7 care from husband  Family educaiton per therapy recommendations.   Team Discussion: Memory issues, poor insight, changed keppra to liquid, on decadron.  RN cont/inc.  OT S/CGA, had fam ed with husband, LOB with distraction, at goal level.  PT S/CGA overall walker, fam ed done.  SLP min/S level, memory impaired.   Revisions to Treatment Plan: N/A     Medical Summary Current Status: incontinent at night; OK during day; LBM today; no pain issues- d/c 2/10 Weekly Focus/Goal: Supervision to CGA with RW  Barriers to Discharge: Behavior;Decreased family/caregiver support;Other (comments);Pending chemo/radiation   Barriers to Discharge Comments: to start radiation 2/10 or later- to f/u with Rad/Onc Possible Resolutions to Barriers: LOB when gets distracted at goal level; SLP- at min-Supervision for Cognition- goal level   Continued Need for Acute Rehabilitation Level of Care: The patient requires daily medical management by a physician with specialized training in physical medicine and rehabilitation for the following reasons: Direction of a multidisciplinary physical rehabilitation program to maximize functional independence : Yes Medical management of patient stability for increased activity during participation in an intensive rehabilitation regime.: Yes Analysis of laboratory values and/or radiology reports with any subsequent need for medication adjustment and/or medical intervention. : Yes   I attest that I was present, lead the team conference, and concur with the assessment and plan of the team.   Retta Diones 12/17/2019, 5:48 PM   Team conference was held via web/ teleconference due to [place] - 19

## 2019-12-17 NOTE — Progress Notes (Signed)
Physical Therapy Discharge Summary  Patient Details  Name: Hailey Hill MRN: [id number] Date of Birth: 05/24/1951  Today's Date: 12/17/2019 PT Individual Time: 1130-1200 PT Individual Time Calculation (min): 30 min    Patient has met 7 of 8 long term goals due to improved activity tolerance, improved balance, improved postural control, increased strength, ability to compensate for deficits and improved awareness.  Patient to discharge at an ambulatory level [place].   Patient's care partner is independent to provide the necessary physical and cognitive assistance at discharge. Patient's husband has completed hands-on family education and is safe to provide Supervision to [place] level that patient requires upon d/c home.  Reasons goals not met: Pt did not meet goal of maintaining dynamic standing balance with Supervision as she requires CGA for this. Pt's family is able to provide this level of assist and she has met goal adequately for a safe d/c home.  Recommendation:  Patient will benefit from ongoing skilled PT services in home health setting to continue to advance safe functional mobility, address ongoing impairments in balance, safety, endurance, and minimize fall risk.  Equipment: RW  Reasons for discharge: treatment goals met and discharge from hospital  Patient/family agrees with progress made and goals achieved: Yes   Skilled Intervention: Pt received seated in recliner in room, agreeable to PT session. Set pt's RW up that she will d/c home with at appropriate height for patient. Pt is Supervision for sit to stand to RW throughout session. Ambulation 2 x 150 ft with RW at [place] level. Pt has one instance of near LOB requiring min A to correct due to being distracted. Reviewed safety precautions with patient to prevent falls at home. Sit to stand x 5 reps from low mat table with BUE support on BLE, CGA. Attempt sit to stand from low mat table with no UE support, pt has near LOB to the  R requiring min A to correct and return to sitting on EOM. Sit to stand x 5 reps from slightly elevated mat table with ball in BUE and min A to stand. Pt left seated in recliner in room with needs in reach, quick release belt and chair alarm in place at end of session.  PT Discharge Precautions/Restrictions Precautions Precautions: Fall Restrictions Weight Bearing Restrictions: No Pain Pain Assessment Pain Scale: 0-10 Pain Score: 0-No pain Vision/Perception  Vision - History Baseline Vision: No visual deficits Perception Perception: Impaired Inattention/Neglect: (mild R inattention) Praxis Praxis: Impaired Praxis Impairment Details: Perseveration  Cognition Overall Cognitive Status: Impaired/Different from baseline Arousal/Alertness: Awake/alert Orientation Level: Oriented X4 Attention: Selective Sustained Attention: Appears intact Selective Attention: Impaired Selective Attention Impairment: Functional complex;Verbal complex Memory: Impaired Memory Impairment: Decreased recall of new information;Decreased short term memory Decreased Short Term Memory: Verbal basic;Functional basic Awareness: Impaired Awareness Impairment: Emergent impairment Problem Solving: Impaired Problem Solving Impairment: Verbal complex;Functional complex Executive Function: Reasoning Reasoning: Impaired Reasoning Impairment: Functional basic;Verbal complex Behaviors: Impulsive;Perseveration Safety/Judgment: Impaired Sensation Sensation Light Touch: Appears Intact Proprioception: Appears Intact Coordination Gross Motor Movements are Fluid and Coordinated: No Fine Motor Movements are Fluid and Coordinated: Yes Coordination and Movement Description: impaired 2/2 balance deficits Motor  Motor Motor: Within Functional Limits Motor - Discharge Observations: [facility]  Mobility Bed Mobility Bed Mobility: Rolling Right;Rolling Left;Supine to Sit;Sit to Supine Rolling Right: Supervision/verbal  cueing Rolling Left: Supervision/Verbal cueing Supine to Sit: Supervision/Verbal cueing Sit to Supine: Supervision/Verbal cueing Transfers Transfers: Sit to Stand;Stand Pivot Transfers;Stand to Sit Sit to Stand: Supervision/Verbal cueing Stand to Sit:  Supervision/Verbal cueing Stand Pivot Transfers: Supervision/Verbal cueing Stand Pivot Transfer Details: Verbal cues for sequencing;Verbal cues for precautions/safety Transfer (Assistive device): Rolling walker Locomotion  Gait Ambulation: Yes Gait Assistance: Contact Guard/Touching assist Gait Distance (Feet): 150 Feet Assistive device: Rolling walker Gait Gait: Yes Gait Pattern: Impaired Gait Pattern: Decreased step length - right;Decreased step length - left;Decreased stride length Gait velocity: decreased Stairs / Additional Locomotion Stairs: Yes Stairs Assistance: Contact Guard/Touching assist Stair Management Technique: One rail Right;Step to pattern;Sideways Number of Stairs: 4 Height of Stairs: 6 Wheelchair Mobility Wheelchair Mobility: No  Trunk/Postural Assessment  Cervical Assessment Cervical Assessment: Within Functional Limits Thoracic Assessment Thoracic Assessment: Exceptions to WFL(flexed posture) Lumbar Assessment Lumbar Assessment: Exceptions to WFL(posterior pelvic tilt) Postural Control Postural Control: Deficits on evaluation Trunk Control: decreased in standing Protective Responses: delayed and inadequate  Balance Balance Balance Assessed: Yes Static Sitting Balance Static Sitting - Level of Assistance: 5: Stand by assistance Dynamic Sitting Balance Dynamic Sitting - Level of Assistance: 5: Stand by assistance Static Standing Balance Static Standing - Level of Assistance: 4: Min assist;5: Stand by assistance Dynamic Standing Balance Dynamic Standing - Level of Assistance: 4: Min assist Extremity Assessment   RLE Assessment RLE Assessment: Within Functional Limits General Strength Comments:  4/5 grossly LLE Assessment LLE Assessment: Within Functional Limits General Strength Comments: 5/5 grossly     Excell Seltzer, PT, DPT 12/17/2019, 12:47 PM

## 2019-12-17 NOTE — Progress Notes (Signed)
Occupational Therapy Session Note  Patient Details  Name: Hailey Hill MRN: [id number] Date of Birth: 1951/02/07  Today's Date: 12/17/2019 OT Individual Time: 1330-1425 OT Individual Time Calculation (min): 55 min    Short Term Goals: Week 2:  OT Short Term Goal 1 (Week 2): STG=LTG secondary to ELOS  Skilled Therapeutic Interventions/Progress Updates:    OT intervention with focus on functional amb with RW, standing balance, safety awareness, functional transfers, discharge planning, and ongoing education to prepare for discharge home tomorrow. Pt amb with RW to tub room and practiced TTB tranfsers with supervision.  Pt engaged in standing activities with focus on attention to task and balance while engaging BUE in functional tasks. Pt requires close supervision and min verbal cues to redirect to task. Educated pt on home safety. Pt verbalizes understanding. Pt returned to room and requested to use toilet. Pt completed all tasks at supervision level. Pt returned to bed.  All needs within reach and bed alarm activated.   Therapy Documentation Precautions:  Precautions Precautions: Fall Precaution Comments: posterior bias; R inattention Restrictions Weight Bearing Restrictions: No Pain:  Pt denies pain   Therapy/Group: Individual Therapy  [name] 12/17/2019, 3:03 PM

## 2019-12-17 NOTE — Progress Notes (Signed)
[facility] PHYSICAL MEDICINE & REHABILITATION PROGRESS NOTE   Subjective/Complaints:  Pt reports eating applesauce made her need to go to bathroom. Made it to bathroom afterwards.   Ate her entire breakfast.   ROS: denies SOB, CP, abd pain, HA, vision changes, N/V/D/C.  Objective:   No results found. Recent Labs    12/16/19 0528  WBC 9.8  HGB 13.2  HCT 38.6  PLT 251   Recent Labs    12/16/19 0528  NA 138  K 3.9  CL 99  CO2 24  GLUCOSE 92  BUN 19  CREATININE 0.80  CALCIUM 9.2    Intake/Output Summary (Last 24 hours) at 12/17/2019 0859 Last data filed at 12/17/2019 0725 Gross per 24 hour  Intake 778 ml  Output -  Net 778 ml     Physical Exam: Vital Signs Blood pressure 123/67, pulse 68, temperature 98.7 F (37.1 C), temperature source Oral, resp. rate 18, height 5\' 8"  (1.727 m), weight 73.9 kg, SpO2 99 %.  Physical Exam  Vitals reviewed. Constitutional:   Awake, alert, appropriate, unusual affect; wearing winter hat in chair this morning. Also wearing cap over winter hat, NAD HENT:  Head: Normocephalic and atraumatic.  Eyes: EOM are normal.  Neck: No tracheal deviation present. No thyromegaly present.  CV: RRR Respiratory: .CTA B/L; no W/R/R GI: Soft. NT, ND. (+) BS Musculoskeletal:     Comments: No edema or tenderness in extremities  Neurological: she is alert Dysarthria Motor: Grossly 5/5 throughout Minimal ataxia right upper extremity  Skin: Skin is warm.  Psychiatric: unusual affect; impaired memory; slow to respond.    Assessment/Plan: 1. 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  Physiatrist is providing close team supervision and 24 hour management of active medical problems listed below.  Physiatrist and rehab team continue to assess barriers  to discharge/monitor patient progress toward functional and medical goals  Care Tool:  Bathing  Bathing activity did not occur: Safety/medical concerns Body parts bathed by patient: Left arm, Right arm, Chest, Abdomen, Front perineal area, Buttocks, Right upper leg, Left upper leg, Face   Body parts bathed by helper: Right lower leg, Left lower leg     Bathing assist Assist Level: Minimal Assistance - Patient > 75%     Upper Body Dressing/Undressing Upper body dressing   What is the patient wearing?: Pull over shirt    Upper body assist Assist Level: Minimal Assistance - Patient > 75%    Lower Body Dressing/Undressing Lower body dressing      What is the patient wearing?: Incontinence brief     Lower body assist Assist for lower body dressing: Minimal Assistance - Patient > 75%     Toileting Toileting    Toileting assist Assist for toileting: Contact Guard/Touching assist     Transfers Chair/bed transfer  Transfers assist  Chair/bed transfer activity did not occur: Safety/medical concerns  Chair/bed transfer assist level: Supervision/Verbal cueing     Locomotion Ambulation   Ambulation assist      Assist level: Contact Guard/Touching assist Assistive device: Walker-rolling Max distance: 150   Walk 10 feet activity   Assist     Assist level: Contact Guard/Touching assist Assistive device: Walker-rolling   Walk 50 feet activity   Assist Walk 50 feet with 2 turns activity did not occur: Safety/medical concerns  Assist level:  Contact Guard/Touching assist Assistive device: Walker-rolling    Walk 150 feet activity   Assist Walk 150 feet activity did not occur: Safety/medical concerns  Assist level: Contact Guard/Touching assist Assistive device: Walker-rolling    Walk 10 feet on uneven surface  activity   Assist Walk 10 feet on uneven surfaces activity did not occur: Safety/medical concerns         Wheelchair     Assist  Will patient use wheelchair at discharge?: No Type of Wheelchair: Manual    Wheelchair assist level: Supervision/Verbal cueing Max wheelchair distance: 150'    Wheelchair 50 feet with 2 turns activity    Assist        Assist Level: Supervision/Verbal cueing   Wheelchair 150 feet activity     Assist      Assist Level: Supervision/Verbal cueing   Blood pressure 123/67, pulse 68, temperature 98.7 F (37.1 C), temperature source Oral, resp. rate 18, height 5\' 8"  (1.727 m), weight 73.9 kg, SpO2 99 %.  Medical Problem List and Plan: 1. 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 .              -patient may shower             -ELOS/Goals:-13 days/supervision/min A             Continue CIR therapies.   1/30: Patient asks questions about why she was taken off her seizure medications prior to this admission. Explained that since she was seizure free prior to admission her physician likely felt the risks of continued seizure medication outweighed the benefits. Asks the difference between seizure and stroke and I explained to her.   2/7- changed Keppra to liquid 500 mg BID- should work better  2/8: no complaints regarding Keppra today.  2.  Antithrombotics: -DVT/anticoagulation:  Will add SQ lovenox as 7 days out from bleed and high risk for DVTs             -antiplatelet therapy: N/A 3. Pain Management: tylenol prn.  1/31: denies pain   2/8: denies pain 4. Mood: LCSW to follow for evaluation and support.              -antipsychotic agents: N/a 5. Neuropsych: This patient is not fully capable of making decisions on her own behalf. 6. Skin/Wound Care: Routine pressure relief measures.  7. Fluids/Electrolytes/Nutrition:  Monitor I/O. 8. 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 Cont 5 mg daily until Neuro/Onc follow up.  2/1- will double check on Decadron dosing- since appears it was stopped-will restart at 4 mg PO daily for now, since 5 mg is unusual dose.  2/3- will need f/u with Dr Isidore Moos after d/c and discuss radiation therapy.   9. Acute  AKI: Encourage fluid intake to help resolve dehydration.  1/28- BUN is 24 and Cr is 0.94  2/4- labs in AM 10. Leucocytosis: Monitor for signs of infection. Will order UCS to monitor for efficacy of treatment.             1/28- WBC down to 11.4 from 12.3- f/u U/A negative- likely from decadron.   2/1- WBC 11.5- U/A was negative; no other signs of infection, will monitor  2/4- labs in AM  2/5- WBC 12.1- still on Decadron- afebrile; con't to monitor  for signs of infection  2/8: WBC 9.8; stable.  11. Orthostatic hypotension  2/4- BP soft this AM- will con't regimen and monitor for signs of OH  2/8: BP stable.  12. Dispo  2/2- will call Husband today to let him know how pt's doing after team conference.   2/3- talked with son 2/2- spent 25 minutes on phone call discussing prognosis and plan for more radiation therapy- need appointment with Dr Isidore Moos- suggest family set up.     LOS: 13 days A FACE TO FACE EVALUATION WAS PERFORMED  Hailey Hill 12/17/2019, 8:59 AM

## 2019-12-17 NOTE — Progress Notes (Signed)
Speech Language Pathology Discharge Summary  Patient Details  Name: Hailey Hill MRN: [id number] Date of Birth: Mar 30, 1951  Today's Date: 12/17/2019 SLP Individual Time: 5271-2929 SLP Individual Time Calculation (min): 44 min   Skilled Therapeutic Interventions:  Pt was seen for skilled ST targeting cognitive goals. SLP facilitated session with a complex daily scheduling task, which pt had attempted to complete in a previous ST session however did not complete due to decreased frustration tolerance. Today, with firm encouragement and rationale behind re-attempting activity, pt hesitant but agreeable to participate. She demonstrated seemingly improved mental flexibility when therapist provided verbal cues for organization and example of how to make inferences regarding priorities on scheduling task based on logic. Pt did required Min A verbal cues for error awareness and extra time to complete it, however she was successful. She also independently requested therapist close door to eliminate distractions and was able to redirect herself back to task at hand when conversation got off topic. Pt left sitting in recliner with alarm in place and needs met to her satisfaction. Continue per current plan of care.      Patient has met 5 of 5 long term goals.  Patient to discharge at overall Supervision;Min level.  Reasons goals not met: n/a   Clinical Impression/Discharge Summary:   Pt made functional gains and met 5 out of 5 long term goals this admission. Pt currently requires Supervision-Min assist for complex cognitive tasks and will require 24/7 supervision at discharge. Pt has demonstrated improved use of compensatory strategies for short term recall, selective attention and ability to appropriately determine how to modify environment when possible to optimize attention, as well as intellectual awareness and problem solving. However, given cognitive impairments still present with functional impact on  daily living, recommend pt continue to receive skilled ST services upon discharge. Pt and family education is complete at this time.   Care Partner:  Caregiver Able to Provide Assistance: Yes  Type of Caregiver Assistance: Cognitive  Recommendation:  Home Health SLP;24 hour supervision/assistance  Rationale for SLP Follow Up: Maximize cognitive function and independence;Reduce caregiver burden   Equipment: none   Reasons for discharge: Discharged from hospital   Patient/Family Agrees with Progress Made and Goals Achieved: Yes    Arbutus Leas 12/17/2019, 9:49 AM

## 2019-12-18 MED ORDER — LEVETIRACETAM 100 MG/ML PO SOLN: 500.0000 mg | Freq: Two times a day (BID) | ORAL | 1 refills | Status: DC

## 2019-12-18 MED ORDER — DEXAMETHASONE 4 MG PO TABS: 4.0000 mg | ORAL_TABLET | Freq: Every day | ORAL | 0 refills | Status: DC

## 2019-12-18 MED ORDER — DOCUSATE SODIUM 100 MG PO CAPS: 100.0000 mg | ORAL_CAPSULE | Freq: Every day | ORAL | 0 refills | Status: DC

## 2019-12-18 NOTE — Progress Notes (Signed)
Location/Histology of Brain Tumor:  11/28/19 MRI brain: BRAIN: Intraparenchymal hematomas within the right cerebellum and right temporal lobe unchanged. There is severe cerebellar edema but no transtentorial herniation. Small amount of subarachnoid blood over the right temporal lobe is unchanged. The white matter signal is normal for the patient's age. No hydrocephalus. There is heterogeneous contrast enhancement associated with the right cerebellar hemorrhage. Inferior vermis lesion has increased in size and now measures 3.3 cm. A smaller lesion at the dorsal aspect of the cerebral aqua duct is decreased in size and now measures 1.2 cm. Anterior right cerebellar lesion is new or increased in size, measuring 8 mm (series 22, image 15).  Patient presented with symptoms of:  She presented to the Emergency Department on 11/27/19 after being found by her husband sitting on the lawn at their house, confused, and covered in emesis and stool.   Past or anticipated interventions, if any, per neurosurgery:  None  Past or anticipated interventions, if any, per medical oncology:  Dr. Mickeal Skinner saw her while she was hospitalized on 11/28/19: ASSESSMENT & PLAN:  Brain Metastases Altered Mental Status [name] presents with clinical and radiographic syndrome c/w novel small cell lung ca infratentorial metastases and subsequent hemorrhagic conversion.  Two lesions lateralized to the right cerebellar hemisphere are responsible for hemorrhage and subsequent edema.  In addition, progression of recently treated (Sept 2020) large midline vermis lesion is contributory.  The etiology of this progression is likely radio-inflammatory change, though organic tumor progression cannot be ruled out.   Novel lesions are best suited for radiosurgery once her clinical condition stabilizes.  Will discuss timing of treatment with Dr. Isidore Moos, as well as possible utility of repeat MRI brain to characterize lesions without extent  of blood products.  At this time, recommend continued dexamethasone (transition to 4mg  BID once arousal level improves, then 4mg  daily after 5 days of BID therapy).  Otherwise, supportive care and physical rehabilitation are priorities. Will continue to follow along and case will be further discussed in brain/spine tumor board meeting.   Dose of Decadron, if applicable: 4 mg daily.   Recent neurologic symptoms, if any:   Seizures: She denies.   Headaches: She denies.   Nausea: She does report nausea/vomiting when laying flat.   Dizziness/ataxia: She denies.   Difficulty with hand coordination: She denies  Focal numbness/weakness: Yes, she is weak.   Visual deficits/changes: She denies.   Confusion/Memory deficits: She is slightly confused today mostly related to fine details of her recent hospital stay.   Painful bone metastases at present, if any: N/A  SAFETY ISSUES:  Prior radiation? Yes,   07/16/2019 through 07/22/2019 Site Technique Total Dose Dose per Fx Completed Fx Beam Energies  Brain: Brain IMRT 27/27 9 3/3 [id number]     Pacemaker/ICD? No  Possible current pregnancy? No  Is the patient on methotrexate? No  Additional Complaints / other details:  She was discharged from inpatient rehab on 12/18/19  BP 105/65 (BP Location: Left Arm, Patient Position: Sitting)   Pulse 75   Temp 98.7 F (37.1 C) (Temporal)   Resp 18   Ht 5\' 8"  (1.727 m)   Wt 156 lb 4 oz (70.9 kg)   SpO2 100%   BMI 23.76 kg/m    Wt Readings from Last 3 Encounters:  12/20/19 156 lb 4 oz (70.9 kg)  12/04/19 162 lb 14.7 oz (73.9 kg)  10/31/19 157 lb 3.2 oz (71.3 kg)

## 2019-12-18 NOTE — Progress Notes (Signed)
[facility] PHYSICAL MEDICINE & REHABILITATION PROGRESS NOTE   Subjective/Complaints:   Pt ready to go home. No complaints.   ROS: denies SOB, CP, abd pain, HA, vision changes, N/V/D/C.  Objective:   No results found. Recent Labs    12/16/19 0528  WBC 9.8  HGB 13.2  HCT 38.6  PLT 251   Recent Labs    12/16/19 0528  NA 138  K 3.9  CL 99  CO2 24  GLUCOSE 92  BUN 19  CREATININE 0.80  CALCIUM 9.2    Intake/Output Summary (Last 24 hours) at 12/18/2019 0811 Last data filed at 12/18/2019 0727 Gross per 24 hour  Intake 536 ml  Output --  Net 536 ml     Physical Exam: Vital Signs Blood pressure 100/62, pulse 70, temperature 98.1 F (36.7 C), resp. rate 16, height 5\' 8"  (1.727 m), weight 73.9 kg, SpO2 100 %.  Physical Exam  Vitals reviewed. Constitutional:   Awake, alert, appropriate, unusual affect; wearing winter hat in chair this morning. Also wearing cap over winter hat, NAD HENT:  Head: Normocephalic and atraumatic.  Eyes: EOM are normal.  Neck: No tracheal deviation present. No thyromegaly present.  CV: RRR Respiratory: .CTA B/L; no W/R/R GI: Soft. NT, ND. (+) BS Musculoskeletal:     Comments: No edema or tenderness in extremities  Neurological: she is alert Dysarthria Motor: Grossly 5/5 throughout Minimal ataxia right upper extremity  Skin: Skin is warm.  Psychiatric: unusual affect; impaired memory; slow to respond.    Assessment/Plan: 1. 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  Physiatrist is providing close team supervision and 24 hour management of active medical problems listed below.  Physiatrist and rehab team continue to assess barriers to discharge/monitor patient progress toward functional and medical goals  Care Tool:  Bathing   Bathing activity did not occur: Safety/medical concerns Body parts bathed by patient: Left arm, Right arm, Chest, Abdomen, Front perineal area, Buttocks, Right upper leg, Left upper leg, Face, Right lower leg, Left lower leg   Body parts bathed by helper: Right lower leg, Left lower leg     Bathing assist Assist Level: Supervision/Verbal cueing     Upper Body Dressing/Undressing Upper body dressing   What is the patient wearing?: Pull over shirt    Upper body assist Assist Level: Supervision/Verbal cueing    Lower Body Dressing/Undressing Lower body dressing      What is the patient wearing?: Pants, Underwear/pull up     Lower body assist Assist for lower body dressing: Supervision/Verbal cueing     Toileting Toileting    Toileting assist Assist for toileting: Supervision/Verbal cueing     Transfers Chair/bed transfer  Transfers assist  Chair/bed transfer activity did not occur: Safety/medical concerns  Chair/bed transfer assist level: Supervision/Verbal cueing     Locomotion Ambulation   Ambulation assist      Assist level: Contact Guard/Touching assist Assistive device: Walker-rolling Max distance: 150   Walk 10 feet activity   Assist     Assist level: Contact Guard/Touching assist Assistive device: Walker-rolling   Walk 50 feet activity   Assist Walk 50 feet with 2 turns activity did not occur: Safety/medical concerns  Assist level: Contact Guard/Touching assist Assistive device: Walker-rolling    Walk 150 feet activity   Assist Walk 150 feet activity did not occur:  Safety/medical concerns  Assist level: Contact Guard/Touching assist Assistive device: Walker-rolling    Walk 10 feet on uneven surface  activity   Assist Walk 10 feet on uneven surfaces activity did not occur: Safety/medical concerns   Assist level: Minimal Assistance - Patient > 75% Assistive device: Aeronautical engineer Will patient use  wheelchair at discharge?: No Type of Wheelchair: Manual    Wheelchair assist level: Supervision/Verbal cueing Max wheelchair distance: 150'    Wheelchair 50 feet with 2 turns activity    Assist        Assist Level: Supervision/Verbal cueing   Wheelchair 150 feet activity     Assist      Assist Level: Supervision/Verbal cueing   Blood pressure 100/62, pulse 70, temperature 98.1 F (36.7 C), resp. rate 16, height 5\' 8"  (1.727 m), weight 73.9 kg, SpO2 100 %.  Medical Problem List and Plan: 1. 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 .              -patient may shower             -ELOS/Goals:-13 days/supervision/min A             Continue CIR therapies.   1/30: Patient asks questions about why she was taken off her seizure medications prior to this admission. Explained that since she was seizure free prior to admission her physician likely felt the risks of continued seizure medication outweighed the benefits. Asks the difference between seizure and stroke and I explained to her.   2/7- changed Keppra to liquid 500 mg BID- should work better  2/8: no complaints regarding Keppra today.  2.  Antithrombotics: -DVT/anticoagulation:  Will add SQ lovenox as 7 days out from bleed and high risk for DVTs             -antiplatelet therapy: N/A 3. Pain Management: tylenol prn.  1/31: denies pain   2/8: denies pain 4. Mood: LCSW to follow for evaluation and support.              -antipsychotic agents: N/a 5. Neuropsych: This patient is not fully capable of making decisions on her own behalf. 6. Skin/Wound Care: Routine pressure relief measures.  7. Fluids/Electrolytes/Nutrition:  Monitor I/O. 8. 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 Cont 5 mg daily until Neuro/Onc follow up.  2/1- will double check on Decadron dosing- since appears it was stopped-will restart at 4 mg PO daily for now, since 5 mg is unusual dose.  2/3- will need f/u with Dr Isidore Moos after d/c and discuss radiation therapy.   9. Acute  AKI: Encourage fluid intake to help resolve dehydration.  1/28- BUN is 24 and Cr is 0.94  2/4- labs in AM 10. Leucocytosis: Monitor for signs of infection. Will order UCS to monitor for efficacy of treatment.             1/28- WBC down to 11.4 from 12.3- f/u U/A negative- likely from decadron.   2/1- WBC 11.5- U/A was negative; no other signs of infection, will monitor  2/4- labs in AM  2/5- WBC 12.1- still on Decadron- afebrile; con't to monitor for signs of infection  2/8: WBC 9.8; stable.  11. Orthostatic hypotension  2/4- BP soft this AM-  will con't regimen and monitor for signs of OH  2/8: BP stable.  12. Dispo  2/2- will call Husband today to let him know how pt's doing after team conference.   2/3- talked with son 2/2- spent 25 minutes on phone call discussing prognosis and plan for more radiation therapy- need appointment with Dr Isidore Moos- suggest family set up.   2/10- d/c today    LOS: 14 days A FACE TO FACE EVALUATION WAS PERFORMED  Kimmi Acocella 12/18/2019, 8:11 AM

## 2019-12-18 NOTE — Plan of Care (Signed)
  Problem: Consults Goal: RH BRAIN INJURY PATIENT EDUCATION Description: Description: See Patient Education module for eduction specifics Outcome: Completed/Met   Problem: RH BOWEL ELIMINATION Goal: RH STG MANAGE BOWEL WITH ASSISTANCE Description: STG Manage Bowel with mod I Assistance. Outcome: Completed/Met Goal: RH STG MANAGE BOWEL W/MEDICATION W/ASSISTANCE Description: STG Manage Bowel with Medication with mod I Assistance. Outcome: Completed/Met   Problem: RH BLADDER ELIMINATION Goal: RH STG MANAGE BLADDER WITH ASSISTANCE Description: STG Manage Bladder With mod I Assistance Outcome: Completed/Met

## 2019-12-18 NOTE — Progress Notes (Signed)
Social Work Discharge Note   The overall goal for the admission was met for:   Discharge location: Yes. Pt discharged to home.  Length of Stay: Yes. 14 days  Discharge activity level: Yes  Home/community participation: Yes  Services provided included: MD, RD, PT, OT, SLP, RN, CM, TR, Pharmacy, Neuropsych and SW  Financial Services: Other: [employer]  Follow-up services arranged: Home Health: [facility]-PT/OT/ST [phone] DME: RW and [facility] with [place] [phone]  Comments (or additional information): Please call patient husband [name] [phone]   Patient/Family verbalized understanding of follow-up arrangements: Yes  Individual responsible for coordination of the follow-up plan: Pt husband [name] Sr to help with coordinating pt care needs. Pt son [name] [phone]) is an alternate contact  (lives in [place]).  Confirmed correct DME delivered: Rana Snare 12/18/2019    [name], [place] Office (preferred): [phone] Cell: [phone] Fax: [phone]

## 2019-12-18 NOTE — Discharge Instructions (Signed)
Inpatient Rehab Discharge Instructions  Hailey Hill Discharge date and time:  10/16/20  Activities/Precautions/ Functional Status: Activity: no lifting, driving, or strenuous exercise till cleared by MD.  Diet: regular diet Wound Care: none needed   Functional status:  ___ No restrictions     ___ Walk up steps independently _X__ 24/7 supervision/assistance   ___ Walk up steps with assistance ___ Intermittent supervision/assistance  ___ Bathe/dress independently ___ Walk with walker     ___ Bathe/dress with assistance ___ Walk Independently    ___ Shower independently ___ Walk with assistance    _X__ Shower with assistance _X__ No alcohol     ___ Return to work/school ________  COMMUNITY REFERRALS UPON DISCHARGE:    Home Health:   PT   OT   ST                  Agency:[facility] Phone: [phone]) *Please expect follow-up with-in 2 days from discharge to schedule your home visit. If you have not received follow-up, be sure to contact the agency directly. *   Medical Equipment/Items Ordered: Rolling Walker and 3-in-1 BSC                                                    Agency/Supplier: [employer]/#[phone]  Special Instructions: 1. NO driving.    My questions have been answered and I understand these instructions. I will adhere to these goals and the provided educational materials after my discharge from the hospital.  Patient/Caregiver Signature _______________________________ Date __________  Clinician Signature _______________________________________ Date __________  Please bring this form and your medication list with you to all your follow-up doctor's appointments.

## 2019-12-18 NOTE — Discharge Summary (Signed)
Physician Discharge Summary  Patient ID: Hailey Hill MRN: [id number] DOB/AGE: 1951-03-29 69 y.o.  Admit date: 12/04/2019 Discharge date: 12/18/2019  Discharge Diagnoses:  Principal Problem:   Lung cancer metastatic to brain [facility]) Active Problems:   Small cell lung cancer ([place])   Metastatic lung cancer (metastasis from lung to other site) [facility])   Metastatic cancer to brain [facility])   Intracranial hemorrhage, nontraumatic ([facility])   Discharged Condition: stable   Significant Diagnostic Studies:  N/A   Labs:  Basic Metabolic Panel: Recent Labs  Lab 12/13/19 0543 12/16/19 0528  NA 138 138  K 4.5 3.9  CL 105 99  CO2 24 24  GLUCOSE 90 92  BUN 22 19  CREATININE 0.80 0.80  CALCIUM 9.2 9.2    CBC: CBC Latest Ref Rng & Units 12/16/2019 12/13/2019 12/09/2019  WBC 4.0 - 10.5 K/uL 9.8 12.1(H) 11.5(H)  Hemoglobin 12.0 - 15.0 g/dL 13.2 13.4 14.1  Hematocrit 36.0 - 46.0 % 38.6 39.2 41.6  Platelets 150 - 400 K/uL 251 249 266    CBG: No results for input(s): GLUCAP in the last 168 hours.  Brief HPI:   Hailey Hill is a 69 y.o. female with history of SCLC with SVC with diagnosis of cerebellar mets treated with XRT, peripheral neuropathy was flushed and vomited and bowel CT of the head demonstrates 100 neurology was consulted for input and felt that patient likely with leading to LOC with fall and trauma.  Keppra added for seizure prophylaxis and Decadron added to help manage edema.  Dr. Saintclair Halsted neurosurgery was consulted for input and felt that patient had progressive brain mets due to additional lesions likely from failure of treatment.  Dr. Vaslow/neuro-oncology recommended radiotherapy once patient had improvement clinically.  Hospital course was complicated by UTI treated with Rocephin x3 days.  Her mentation is improving however patient continued to have poor safety awareness with impulsivity deficits and problem solving as well as an ADLs and recommended due to functional  decline   Hospital Course: Hailey Hill was admitted to rehab 12/04/2019 for inpatient therapies to consist of PT, ST and OT at least three hours five days a week. Past admission physiatrist, therapy team and rehab RN have worked together to provide customized collaborative inpatient rehab. Blood pressures have been stable and she has been afebrile during her stay. Keppra was changed to elixir form per patient request and she has been seizure-free during her stay. Serial CBC shows leukocytosis likely steroid mediated to be resolving.  She continues on low-dose Decadron at discharge. Follow up BMET showed that mild pre-renal azotemia which has resolved and lytes are WNL.    Her mood and motivation have improved with improvement in her cognitive status.  Her p.o. intake has been good and she is continent of bowel and bladder. She has made good gains during her rehab stay and is at supervision level. She will continue to receive follow up [place], [place] and [place] by [facility] after discharge   Rehab course: During patient's stay in rehab weekly team conferences were held to monitor patient's progress, set goals and discuss barriers to discharge. At admission, patient required mod assist with basic self-care task and with mobility.  She exhibited deficits in selective attention, short-term memory, semicomplex problem-solving and did not demonstrate any safety awareness.  Speech intelligibility was decreased due to dysprosody.  She  has had improvement in activity tolerance, balance, postural control as well as ability to compensate for deficits. She is able  to complete ADL tasks with supervision.  She requires supervision with verbal cues for transfers and is able to ambulate 150 feet with rolling walker and contact-guard assist.  She continues to have decreased balance with dynamic standing to prevent falls. She requires min verbal cues for safety and redirection to tasks especially when ambulating.   She  requires supervision to min assist for complex cognitive tasks.  She is showing improvement in use of compensatory strategies for short-term recall, selective attention as well as awareness.  Was completed with husband regarding all aspects of care and safety   Disposition: 01-Home or Self Care  Diet: Regular  Special Instructions: 1. No driving or strenuous activity till cleared by MD.   Discharge Instructions    Ambulatory referral to Physical Medicine Rehab   Complete by: As directed    1-2 weeks TC follow up     Allergies as of 12/18/2019      Reactions   Promethazine Hcl Other (See Comments)   Pt reports feeling shaky and "sick" after phenergan      Medication List    STOP taking these medications   cholecalciferol 25 MCG (1000 UNIT) tablet Commonly known as: VITAMIN D3   levETIRAcetam 500 MG tablet Commonly known as: KEPPRA Replaced by: levETIRAcetam 100 MG/ML solution     TAKE these medications   acetaminophen 325 MG tablet Commonly known as: TYLENOL Take 1-2 tablets (325-650 mg total) by mouth every 4 (four) hours as needed for mild pain.   dexamethasone 4 MG tablet Commonly known as: DECADRON Take 1 tablet (4 mg total) by mouth daily.   docusate sodium 100 MG capsule Commonly known as: COLACE Take 1 capsule (100 mg total) by mouth daily. Notes to patient: Over the counter   levETIRAcetam 100 MG/ML solution Commonly known as: KEPPRA Take 5 mLs (500 mg total) by mouth 2 (two) times daily. Replaces: levETIRAcetam 500 MG tablet   vitamin B-12 1000 MCG tablet Commonly known as: CYANOCOBALAMIN Take 1,000 mcg by mouth daily.      Follow-up Information    Lovorn, Jinny Blossom, MD Follow up.   Specialty: Physical Medicine and Rehabilitation Why: Office will call you with follow up appointment Contact information: [address] [address] [phone]        Eppie Gibson, MD Follow up on 12/20/2019.   Specialty: Radiation  Oncology Why: Appointment at 8:30 am Contact information: [place] [place] [address] [phone]        Kary Kos, MD. Call.   Specialty: Neurosurgery Why: for follow up appointment Contact information: [address] [place] [address] [phone]        [name], NP Follow up.   Why: Call for post hospital follow up Contact information: [place] [address] [phone]           Signed: Bary Leriche 12/19/2019, 11:29 PM

## 2019-12-18 NOTE — Progress Notes (Signed)
Patient was discharged from  56M07.  Patient left floor via wheelchair escorted by nursing staff.  Patient verbalized understanding of discharge instructions as given by Algis Liming, PA.  All patient belongings sent with patient including DME and prescriptions.  Patient appears to be in no immediate distress at this time.    Brita Romp, RN

## 2019-12-19 NOTE — Progress Notes (Signed)
Radiation Oncology         [phone] ________________________________  Outpatient Re-Consultation in person  Name: Hailey Hill MRN: [id number]  Date: 12/20/2019  DOB: 03-22-1951  NI:[id number], Joelene Millin, NP  Maryanna Shape, NP   REFERRING PHYSICIAN: Maryanna Shape, NP  DIAGNOSIS: The encounter diagnosis was Metastatic cancer to brain [facility]).   ICD-10-CM   1. Metastatic cancer to brain [facility])  C79.31     HISTORY OF PRESENT ILLNESS::Hailey Hill is a 69 y.o. female who returns today for re-evaluation. 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 I then saw her as an inpatient for brain metastases. In summary, from my original consult note, she was in remission when in 06/2019, she noted dizziness and fullness in her ear. She started to fall and believed she has blacked out as she couldn't remember the actual act of falling on certain occasions.  She was admitted to the ED after a bad fall resulting in left humeral fracture. MRI of brain showed two large cerebellar lesions c/w metastases. The smaller lesion is in the superior vermis, approximately 1.5 x 2 x 2 cm. The larger lesion is in the midline to slight inferior vermis extending into the RIGHT cerebellar hemisphere, approximately 2.5 x 3 x 3 cm. There is moderate surrounding edema. And effacement of the fourth ventricle. She received 27 Gy in 3 fractions to the two cerebellar metastases using SRS, completed 07/22/2019. Follow up brain MRI from 10/17/2019 showed improvement in the treated lesions and no new mass or abnormal enhancement.  Since then, she was taken to the ER on 11/27/2019 with concern for a stroke. Head CT showed a bleed into the metastatic lesion. It was thought she most likely had a seizure then fell, as she also had a scalp hematoma and small subarachnoid hemorrhage. This was followed by a brain MRI showing severe cerebellar edema and new  contrast-enhancing lesions in the right cerebellar hemisphere c/w metastasis.  I reviewed her imaging with her team at tumor board.  The consensus was to allow more time for her brain to heal from the hemorrhage, allow the blood products to resolve, and for the patient to heal physically overall and to rescan her brain in March.    She is scheduled for repeat brain MRI on 01/17/2020.  She was recently discharged from inpatient rehabilitation.  She reports her husband has been very helpful at home.  He is keeping track of her medications.  She is taking dexamethasone and Keppra.  She reports that her biggest priority is learning to walk properly again.  She walks with a walker or holds onto walls.  She does a couple short exercises at home.  She denies any headaches.  She denies any dizziness.  She reports that she does get nauseous when she lies down flat.  She reports that she does not have ataxia.   PREVIOUS RADIATION THERAPY: Yes  10/04/10 - 11/19/10: Chest / 60 Gy in 30 fractions 12/07/10 - 12/20/10: PCI / 25 Gy in 10 fractions 07/16/2019 - 07/22/2019: Two Brain Mets (SRS) / 27 Gy in 3 fractions  PAST MEDICAL HISTORY:  has a past medical history of Complication of anesthesia, Dyslipidemia, History of radiation therapy (07/16/19, 07/19/19, 07/22/19), Lung cancer ([place]) (dx'd 07/2010), Peripheral neuropathy, SVC syndrome, and Thrombus (10/21/2010).    PAST SURGICAL HISTORY: Past Surgical History:  Procedure Laterality Date  . CERVICAL BIOPSY    . CHOLECYSTECTOMY  N/A 07/25/2016   Procedure: LAPAROSCOPIC CHOLECYSTECTOMY;  Surgeon: Coralie Keens, MD;  Location: [place];  Service: General;  Laterality: N/A;  . TONSILLECTOMY    . TUBAL LIGATION      FAMILY HISTORY: family history includes Heart attack in her father, paternal grandfather, and paternal uncle; Nephrolithiasis in her son.  SOCIAL HISTORY:  reports that she quit smoking about 9 years ago. She has never used smokeless  tobacco. She reports that she does not drink alcohol or use drugs.  ALLERGIES: Promethazine hcl  MEDICATIONS:  Current Outpatient Medications  Medication Sig Dispense Refill  . dexamethasone (DECADRON) 4 MG tablet Take 1 tablet (4 mg total) by mouth daily. 30 tablet 0  . levETIRAcetam (KEPPRA) 100 MG/ML solution Take 5 mLs (500 mg total) by mouth 2 (two) times daily. 473 mL 1  . vitamin B-12 (CYANOCOBALAMIN) 1000 MCG tablet Take 1,000 mcg by mouth daily.    Marland Kitchen acetaminophen (TYLENOL) 325 MG tablet Take 1-2 tablets (325-650 mg total) by mouth every 4 (four) hours as needed for mild pain. (Patient not taking: Reported on 12/20/2019)    . docusate sodium (COLACE) 100 MG capsule Take 1 capsule (100 mg total) by mouth daily. (Patient not taking: Reported on 12/20/2019) 30 capsule 0   No current facility-administered medications for this encounter.    REVIEW OF SYSTEMS:   As above  PHYSICAL EXAM:  height is 5\' 8"  (1.727 m) and weight is 156 lb 4 oz (70.9 kg). Her temporal temperature is 98.7 F (37.1 C). Her blood pressure is 105/65 and her pulse is 75. Her respiration is 18 and oxygen saturation is 100%.   Neuro: Rapidly alternating movements are somewhat slowed but grossly intact.  No nystagmus.  No resting tremor.  Speech is not slurred.  She is sitting in a wheelchair.  Strength is 4 out of 5 in her lower extremities.  No lower extremity edema.  No facial droop. Object recall is 0 out of 3 at 3 minutes. HEENT: No oral thrush  LABORATORY DATA:  Lab Results  Component Value Date   WBC 9.8 12/16/2019   HGB 13.2 12/16/2019   HCT 38.6 12/16/2019   MCV 89.8 12/16/2019   PLT 251 12/16/2019   CMP     Component Value Date/Time   NA 138 12/16/2019 0528   NA 142 04/25/2017 0805   K 3.9 12/16/2019 0528   K 4.5 04/25/2017 0805   CL 99 12/16/2019 0528   CL 108 (H) 01/09/2013 0913   CO2 24 12/16/2019 0528   CO2 26 04/25/2017 0805   GLUCOSE 92 12/16/2019 0528   GLUCOSE 95 04/25/2017 0805    GLUCOSE 101 (H) 01/09/2013 0913   BUN 19 12/16/2019 0528   BUN 12.8 04/25/2017 0805   CREATININE 0.80 12/16/2019 0528   CREATININE 0.77 10/29/2019 0949   CREATININE 0.9 04/25/2017 0805   CALCIUM 9.2 12/16/2019 0528   CALCIUM 9.6 04/25/2017 0805   PROT 5.4 (L) 12/13/2019 0543   PROT 6.6 04/25/2017 0805   ALBUMIN 3.1 (L) 12/13/2019 0543   ALBUMIN 3.8 04/25/2017 0805   AST 15 12/13/2019 0543   AST 11 (L) 10/29/2019 0949   AST 13 04/25/2017 0805   ALT 24 12/13/2019 0543   ALT 8 10/29/2019 0949   ALT 15 04/25/2017 0805   ALKPHOS 61 12/13/2019 0543   ALKPHOS 79 04/25/2017 0805   BILITOT 0.4 12/13/2019 0543   BILITOT 0.4 10/29/2019 0949   BILITOT 0.47 04/25/2017 0805   GFRNONAA >  60 12/16/2019 0528   GFRNONAA >60 10/29/2019 0949   GFRAA >60 12/16/2019 0528   GFRAA >60 10/29/2019 0949         RADIOGRAPHY: EEG  Result Date: 11/28/2019 Lora Havens, MD     11/28/2019  9:51 AM Patient Name: VINIE CHARITY MRN: [id number] Epilepsy Attending: Lora Havens Referring Physician/Provider: Dr. Jani Gravel Date: 11/28/2019 Duration: 23.29 minutes Patient history: 68 year old female with history of metastatic lung cancer with brain metastatic cysts who presented with Dr. Lennox Solders status and slurred speech.  EEG to evaluate for seizures. Level of alertness: Awake, asleep AEDs during EEG study: Keppra Technical aspects: This EEG study was done with scalp electrodes positioned according to the 10-20 International system of electrode placement. Electrical activity was acquired at a sampling rate of 500Hz  and reviewed with a high frequency filter of 70Hz  and a low frequency filter of 1Hz . EEG data were recorded continuously and digitally stored. Description: The posterior dominant rhythm consists of 8 Hz activity of moderate voltage (25-35 uV) seen predominantly in posterior head regions, symmetric and reactive to eye opening and eye closing.  Sleep was characterized by vertex waves, sleep spindles (12 to  14 Hz), maximal frontocentral.  EEG showed continuous generalized and maximal right temporal 3 to 5 Hz theta-delta slowing.  Hyperventilation and photic stimulation were not performed. Abnormality -Continuous slow, generalized, maximal right temporal    IMPRESSION: This study showed evidence of cortical dysfunction in right temporal region likely secondary to underlying hemorrhage as well as mild diffuse encephalopathy, nonspecific etiology. No seizures or epileptiform discharges were seen throughout the recording. Lora Havens   MR Brain W and Wo Contrast  Result Date: 11/28/2019 CLINICAL DATA:  Seizure and intracranial metastatic disease. EXAM: MRI HEAD WITHOUT AND WITH CONTRAST TECHNIQUE: Multiplanar, multiecho pulse sequences of the brain and surrounding structures were obtained without and with intravenous contrast. CONTRAST:  16mL GADAVIST GADOBUTROL 1 MMOL/ML IV SOLN COMPARISON:  10/17/2019 brain MRI Head CT 11/27/2019 FINDINGS: BRAIN: Intraparenchymal hematomas within the right cerebellum and right temporal lobe unchanged. There is severe cerebellar edema but no transtentorial herniation. Small amount of subarachnoid blood over the right temporal lobe is unchanged. The white matter signal is normal for the patient's age. No hydrocephalus. There is heterogeneous contrast enhancement associated with the right cerebellar hemorrhage. Inferior vermis lesion has increased in size and now measures 3.3 cm. A smaller lesion at the dorsal aspect of the cerebral aqua duct is decreased in size and now measures 1.2 cm. Anterior right cerebellar lesion is new or increased in size, measuring 8 mm (series 22, image 15). VASCULAR: The major intracranial arterial and venous sinus flow voids are normal. Susceptibility-sensitive sequences show no chronic microhemorrhage or superficial siderosis. SKULL AND UPPER CERVICAL SPINE: Left parietal scalp subgaleal hematoma. No focal calvarial lesions. SINUSES/ORBITS: Bilateral  mastoid fluid. The orbits are normal. IMPRESSION: 1. Unchanged size of intraparenchymal hematomas within the right cerebellum and right temporal lobe. 2. Severe cerebellar edema without transtentorial or tonsillar herniation. 3. New contrast-enhancing lesions within the right cerebellar hemisphere, consistent with worsening intracranial metastatic disease. No supratentorial metastatic disease. Electronically Signed   By: Ulyses Jarred M.D.   On: 11/28/2019 03:50   DG CHEST PORT 1 VIEW  Result Date: 11/28/2019 CLINICAL DATA:  Nausea and vomiting.  History of stroke. EXAM: PORTABLE CHEST 1 VIEW COMPARISON:  CT 10/29/2019.  Chest x-ray 07/07/2019, 12/14/2015. FINDINGS: Mediastinum appears stable. Heart size stable. Stable changes of pleural-parenchymal scarring noted over the left  suprahilar region consistent with prior radiation therapy. Mild bibasilar atelectasis. No prominent pleural effusion. No pneumothorax. Surgical clips left chest. Thoracic spine scoliosis. IMPRESSION: 1. Stable changes of pleural-parenchymal scarring noted over the left suprahilar region consistent prior radiation therapy. 2.  Mild bibasilar atelectasis. Electronically Signed   By: Marcello Moores  Register   On: 11/28/2019 07:25   CT HEAD CODE STROKE WO CONTRAST  Result Date: 11/27/2019 CLINICAL DATA:  Altered mental status, slurred speech. Last known normal 16:30 EXAM: CT HEAD WITHOUT CONTRAST TECHNIQUE: Contiguous axial images were obtained from the base of the skull through the vertex without intravenous contrast. COMPARISON:  Brain MRI 10/17/2019 FINDINGS: Brain: Again demonstrated is edema within the right greater than left cerebellar hemispheres. Known metastatic lesions within the right cerebellum and midline cerebellar vermis were better appreciated on brain MRI 10/17/2019. Superimposed acute parenchymal hemorrhage within the right cerebellum. The dominant component of the hemorrhage measures 2.6 x 1.9 x 2.2 cm (series 2, image 7)  (series 4, image 45). Posterior fossa mass effect appears similar to prior MRI 10/17/2019 with partial effacement of the fourth ventricle. The lateral and third ventricles are similar in size as compared to prior MRI. There is a small hemorrhagic parenchymal contusion within the inferolateral right temporal lobe with a small amount of adjacent acute subarachnoid hemorrhage (series 4, image 32) (series 2, image 13). No supratentorial midline shift. Unchanged generalized parenchymal atrophy. Vascular: No hyperdense vessel. Skull: Although not definitively identified, a left temporal bone fracture is suspected given the presence of a left mastoid effusion and adjacent subcutaneous emphysema. Sinuses/Orbits: Visualized orbits demonstrate no acute abnormality. Mild mucosal thickening within the right maxillary sinus. Left mastoid effusion. Other: There is subcutaneous gas within the left masticator/parapharyngeal space. Left posterior scalp hematoma. These results were called by telephone at the time of interpretation on 11/27/2019 at 8:02 pm to provider Dr. Lorraine Lax, who verbally acknowledged these results. IMPRESSION: 1. Prominent edema within the right greater than left cerebellar hemispheres at site of known metastases. Superimposed acute parenchymal hemorrhage within the right cerebellum measuring 2.6 x 1.9 x 2.2 cm. Posterior fossa mass effect is similar to prior MRI with partial effacement of the fourth ventricle. 2. Small hemorrhagic contusion within the inferolateral right temporal lobe with adjacent small volume acute subarachnoid hemorrhage. 3. Although not definitively identified, an acute fracture of the left temporal bone is suspected given the presence of a left mastoid effusion and adjacent soft tissue gas within the left masticator/parapharyngeal space. 4. Left posterior scalp hematoma. Electronically Signed   By: Kellie Simmering DO   On: 11/27/2019 20:19      IMPRESSION/PLAN: This is a very pleasant woman  with history of brain metastases  She will continue to work on rehabilitation at home.  I offered a referral for home health physical therapy or outpatient physical therapy but she is not interested in this.  Continue to take medications as prescribed and plan for rescan of brain with MRI in a month.  We will talk about the results at tumor board and determine next steps of action.  Right now it is hard to discern how much of the change on her MRI was treatment effect versus active disease.  We will  allow more time for her brain to heal from the hemorrhage, allow the blood products to resolve, and for the patient to heal physically overall.  She is scheduled for repeat brain MRI on 01/17/2020.   On date of service, in total, I spent 30 minutes on  this encounter __________________________________________   Eppie Gibson, MD  This document serves as a record of services personally performed by Eppie Gibson, MD. It was created on her behalf by Wilburn Mylar, a trained medical scribe. The creation of this record is based on the scribe's personal observations and the provider's statements to them. This document has been checked and approved by the attending provider.

## 2019-12-20 ENCOUNTER — Telehealth: Payer: Self-pay | Admitting: Registered Nurse

## 2019-12-20 ENCOUNTER — Ambulatory Visit
Admission: RE | Admit: 2019-12-20 | Discharge: 2019-12-20 | Disposition: A | Discharge status: Home / Self Care | Payer: Medicare HMO | Source: Ambulatory Visit | Attending: Radiation Oncology | Admitting: Radiation Oncology

## 2019-12-20 ENCOUNTER — Encounter: Payer: Self-pay | Admitting: Radiation Oncology

## 2019-12-20 ENCOUNTER — Other Ambulatory Visit: Payer: Self-pay

## 2019-12-20 DIAGNOSIS — Z8673 Personal history of transient ischemic attack (TIA), and cerebral infarction without residual deficits: Secondary | ICD-10-CM | POA: Insufficient documentation

## 2019-12-20 DIAGNOSIS — R4781 Slurred speech: Secondary | ICD-10-CM | POA: Diagnosis not present

## 2019-12-20 DIAGNOSIS — Z79899 Other long term (current) drug therapy: Secondary | ICD-10-CM | POA: Insufficient documentation

## 2019-12-20 DIAGNOSIS — C7931 Secondary malignant neoplasm of brain: Secondary | ICD-10-CM | POA: Diagnosis not present

## 2019-12-20 DIAGNOSIS — Z7952 Long term (current) use of systemic steroids: Secondary | ICD-10-CM | POA: Diagnosis not present

## 2019-12-20 DIAGNOSIS — Z87891 Personal history of nicotine dependence: Secondary | ICD-10-CM | POA: Insufficient documentation

## 2019-12-20 DIAGNOSIS — Z923 Personal history of irradiation: Secondary | ICD-10-CM | POA: Diagnosis not present

## 2019-12-20 DIAGNOSIS — G629 Polyneuropathy, unspecified: Secondary | ICD-10-CM | POA: Insufficient documentation

## 2019-12-20 DIAGNOSIS — R4182 Altered mental status, unspecified: Secondary | ICD-10-CM | POA: Insufficient documentation

## 2019-12-20 DIAGNOSIS — C3490 Malignant neoplasm of unspecified part of unspecified bronchus or lung: Secondary | ICD-10-CM | POA: Insufficient documentation

## 2019-12-20 DIAGNOSIS — R112 Nausea with vomiting, unspecified: Secondary | ICD-10-CM | POA: Diagnosis not present

## 2019-12-20 DIAGNOSIS — E785 Hyperlipidemia, unspecified: Secondary | ICD-10-CM | POA: Diagnosis not present

## 2019-12-20 NOTE — Telephone Encounter (Signed)
Placed a call to Hailey Hill, no answer. Left message to return the call.  Placed a call to the home number as well, no answer.

## 2019-12-27 ENCOUNTER — Encounter: Payer: Medicare HMO | Attending: Physical Medicine and Rehabilitation | Admitting: Physical Medicine and Rehabilitation

## 2020-01-01 ENCOUNTER — Telehealth: Payer: Self-pay

## 2020-01-01 NOTE — Telephone Encounter (Signed)
[facility] called stating they have attempted to call patient to schedule and no return call. [facility] states while reading Epic notes patient kept Radiology f/u appt and MD noted that patient didn't want [place] services.

## 2020-01-01 NOTE — Telephone Encounter (Signed)
OK- well; Pt doesn't have to, but see if we can cal her and ask clearly so [facility] doesn't waste time.Marland KitchenMarland Kitchen

## 2020-01-17 ENCOUNTER — Other Ambulatory Visit: Payer: Self-pay

## 2020-01-17 ENCOUNTER — Ambulatory Visit
Admission: RE | Admit: 2020-01-17 | Discharge: 2020-01-17 | Disposition: A | Discharge status: Home / Self Care | Payer: Medicare HMO | Source: Ambulatory Visit | Attending: Internal Medicine | Admitting: Internal Medicine

## 2020-01-17 DIAGNOSIS — C7931 Secondary malignant neoplasm of brain: Secondary | ICD-10-CM

## 2020-01-17 MED ORDER — GADOBENATE DIMEGLUMINE 529 MG/ML IV SOLN
14.0000 mL | Freq: Once | INTRAVENOUS | Status: AC | PRN
  Administered 2020-01-17: 14 mL via INTRAVENOUS

## 2020-01-20 ENCOUNTER — Inpatient Hospital Stay: Payer: Medicare HMO | Attending: Internal Medicine | Admitting: Internal Medicine

## 2020-01-20 ENCOUNTER — Telehealth: Payer: Self-pay | Admitting: Internal Medicine

## 2020-01-20 ENCOUNTER — Inpatient Hospital Stay: Payer: Medicare HMO

## 2020-01-20 ENCOUNTER — Other Ambulatory Visit: Payer: Self-pay

## 2020-01-20 VITALS — BP 113/79 | HR 82 | Temp 98.7°F | Resp 18 | Ht 68.0 in | Wt 153.0 lb

## 2020-01-20 DIAGNOSIS — C3490 Malignant neoplasm of unspecified part of unspecified bronchus or lung: Secondary | ICD-10-CM | POA: Diagnosis present

## 2020-01-20 DIAGNOSIS — Z7952 Long term (current) use of systemic steroids: Secondary | ICD-10-CM | POA: Insufficient documentation

## 2020-01-20 DIAGNOSIS — Z87891 Personal history of nicotine dependence: Secondary | ICD-10-CM | POA: Insufficient documentation

## 2020-01-20 DIAGNOSIS — E785 Hyperlipidemia, unspecified: Secondary | ICD-10-CM | POA: Diagnosis not present

## 2020-01-20 DIAGNOSIS — Z923 Personal history of irradiation: Secondary | ICD-10-CM | POA: Diagnosis not present

## 2020-01-20 DIAGNOSIS — C7931 Secondary malignant neoplasm of brain: Secondary | ICD-10-CM | POA: Diagnosis not present

## 2020-01-20 DIAGNOSIS — Z79899 Other long term (current) drug therapy: Secondary | ICD-10-CM | POA: Insufficient documentation

## 2020-01-20 MED ORDER — PENTOXIFYLLINE ER 400 MG PO TBCR: 400.0000 mg | EXTENDED_RELEASE_TABLET | Freq: Three times a day (TID) | ORAL | 3 refills | Status: DC

## 2020-01-20 MED ORDER — VITAMIN E 180 MG (400 UNIT) PO CAPS: 400.0000 [IU] | ORAL_CAPSULE | Freq: Every day | ORAL | 3 refills | Status: DC

## 2020-01-20 NOTE — Progress Notes (Addendum)
[facility] [address], [place] [address] [phone]  Interval Evaluation  Date of Service: 01/20/20 Patient Name: Hailey Hill Patient MRN: [id number] Patient DOB: 06/11/1951 Provider: Ventura Sellers, MD  Identifying Statement:  Hailey Hill is a 69 y.o. female with Metastatic cancer to brain [facility]) [C79.31]   Primary Cancer: Small Cell Lung  CNS Oncologic History: 2012: Undergoes PCI 07/22/19: Completes 3 frx SRS to cerebllar metastases with Isidore Moos  Interval History:  Hailey Hill presents today for follow up after recent hospitalization and rehabilitation admission for recurrent brain metastases and hemorrhage.  Her balance is still severely impacted, she is unable to ambulate without assistance from family member.  Use of her hands is mostly normal except for some clumsiness in the right hand.  Heavily slurred speech has been present since admission.  Otherwise no further "fall episodes". Decadron is at 4mg  daily, and keppra at 500mg  twice per day despite lack of seizure activity.  Mood is unsteady with frequent upset and crying.  presents today to review CNS tumor burden and neurologic deficits.  She describes several weeks ago experiencing multiple falls due to feeling off balanced and vertiginous.  Although these symptoms were described as new, she does have several months history of difficulty walking at night without the lights on.  During one fall, she broke her left arm, requiring hospitalization.  CNS imaging demonstrated 2 enhancing cerebellar metastases.  Systemic imaging had been stable in July, as she has been on observation for several years; last brain MRI on record was in 2012 following PCI.  At present she is at a rehab facility.  Main deficit is difficulty walking, and though she is able to walk some on her own, she doesn't feel safe and is requiring assistance.  Continues on decadron taper currently 4mg  TID.     Medications: Current Outpatient Medications on File Prior to Visit  Medication Sig Dispense Refill  . dexamethasone (DECADRON) 4 MG tablet Take 1 tablet (4 mg total) by mouth daily. 30 tablet 0  . levETIRAcetam (KEPPRA) 100 MG/ML solution Take 5 mLs (500 mg total) by mouth 2 (two) times daily. 473 mL 1  . acetaminophen (TYLENOL) 325 MG tablet Take 1-2 tablets (325-650 mg total) by mouth every 4 (four) hours as needed for mild pain. (Patient not taking: Reported on 12/20/2019)    . docusate sodium (COLACE) 100 MG capsule Take 1 capsule (100 mg total) by mouth daily. (Patient not taking: Reported on 12/20/2019) 30 capsule 0  . vitamin B-12 (CYANOCOBALAMIN) 1000 MCG tablet Take 1,000 mcg by mouth daily.     No current facility-administered medications on file prior to visit.    Allergies:  Allergies  Allergen Reactions  . Promethazine Hcl Other (See Comments)    Pt reports feeling shaky and "sick" after phenergan   Past Medical History:  Past Medical History:  Diagnosis Date  . Complication of anesthesia    had complications with "gas buildup" after tubal lig  . Dyslipidemia   . History of radiation therapy 07/16/19, 07/19/19, 07/22/19   SRS radiation to 2 cerebellum targets.   . Lung cancer ([place]) dx'd 07/2010   sm cell   . Peripheral neuropathy   . SVC syndrome   . Thrombus 50/35/4656   Left basilic vein,  Left subclavian vein   Past Surgical History:  Past Surgical History:  Procedure Laterality Date  . CERVICAL BIOPSY    . CHOLECYSTECTOMY N/A  07/25/2016   Procedure: LAPAROSCOPIC CHOLECYSTECTOMY;  Surgeon: Coralie Keens, MD;  Location: [place];  Service: General;  Laterality: N/A;  . TONSILLECTOMY    . TUBAL LIGATION     Social History:  Social History   Socioeconomic History  . Marital status: Married    Spouse name: Not on file  . Number of children: Not on file  . Years of education: Not on file  . Highest education level: Not on file   Occupational History  . Not on file  Tobacco Use  . Smoking status: Former Smoker    Quit date: 03/08/2010    Years since quitting: 9.8  . Smokeless tobacco: Never Used  Substance and Sexual Activity  . Alcohol use: No  . Drug use: No  . Sexual activity: Never    Birth control/protection: Surgical  Other Topics Concern  . Not on file  Social History Narrative  . Not on file   Social Determinants of Health   Financial Resource Strain:   . Difficulty of Paying Living Expenses:   Food Insecurity:   . Worried About Charity fundraiser in the Last Year:   . Arboriculturist in the Last Year:   Transportation Needs: No Transportation Needs  . Lack of Transportation (Medical): No  . Lack of Transportation (Non-Medical): No  Physical Activity:   . Days of Exercise per Week:   . Minutes of Exercise per Session:   Stress:   . Feeling of Stress :   Social Connections:   . Frequency of Communication with Friends and Family:   . Frequency of Social Gatherings with Friends and Family:   . Attends Religious Services:   . Active Member of Clubs or Organizations:   . Attends Archivist Meetings:   Marland Kitchen Marital Status:   Intimate Partner Violence: Not At Risk  . Fear of Current or Ex-Partner: No  . Emotionally Abused: No  . Physically Abused: No  . Sexually Abused: No   Family History:  Family History  Problem Relation Age of Onset  . Nephrolithiasis Son   . Heart attack Father   . Heart attack Paternal Uncle   . Heart attack Paternal Grandfather     Review of Systems: Constitutional: Denies fevers, chills or abnormal weight loss Eyes: Denies blurriness of vision Ears, nose, mouth, throat, and face: Denies mucositis or sore throat Respiratory: Denies cough, dyspnea or wheezes Cardiovascular: Denies palpitation, chest discomfort or lower extremity swelling Gastrointestinal:  Denies nausea, constipation, diarrhea GU: Denies dysuria or incontinence Skin: Denies abnormal  skin rashes Neurological: Per HPI Musculoskeletal: Denies joint pain, back or neck discomfort. No decrease in ROM Behavioral/Psych: +anxiety and mood instability   Physical Exam: Vitals:   01/20/20 0906  BP: 113/79  Pulse: 82  Resp: 18  Temp: 98.7 F (37.1 C)  SpO2: 100%   KPS: 60. General: Alert, cooperative, pleasant, in no acute distress Head: Normal EENT: No conjunctival injection or scleral icterus. Oral mucosa moist Lungs: Resp effort normal Cardiac: Regular rate and rhythm Abdomen: Soft, non-distended abdomen Skin: No rashes cyanosis or petechiae. Extremities: No clubbing or edema  Neurologic Exam: Mental Status: Awake, alert, attentive to examiner. Oriented to self and environment. Language is fluent with intact comprehension.  Cranial Nerves: Dense dysarthria 2/2 dysarticulation. Visual acuity is grossly normal. Visual fields are full. No ptosis. Face is symmetric, tongue midline. Motor: Tone and bulk are normal. Power is full in both arms and legs. Reflexes are symmetric,  no pathologic reflexes present. Modest dysmetria on right  Sensory: Decreased to ankles in stocking pattern Gait: Ataxic with truncal ataxia  Labs: I have reviewed the data as listed    Component Value Date/Time   NA 138 12/16/2019 0528   NA 142 04/25/2017 0805   K 3.9 12/16/2019 0528   K 4.5 04/25/2017 0805   CL 99 12/16/2019 0528   CL 108 (H) 01/09/2013 0913   CO2 24 12/16/2019 0528   CO2 26 04/25/2017 0805   GLUCOSE 92 12/16/2019 0528   GLUCOSE 95 04/25/2017 0805   GLUCOSE 101 (H) 01/09/2013 0913   BUN 19 12/16/2019 0528   BUN 12.8 04/25/2017 0805   CREATININE 0.80 12/16/2019 0528   CREATININE 0.77 10/29/2019 0949   CREATININE 0.9 04/25/2017 0805   CALCIUM 9.2 12/16/2019 0528   CALCIUM 9.6 04/25/2017 0805   PROT 5.4 (L) 12/13/2019 0543   PROT 6.6 04/25/2017 0805   ALBUMIN 3.1 (L) 12/13/2019 0543   ALBUMIN 3.8 04/25/2017 0805   AST 15 12/13/2019 0543   AST 11 (L) 10/29/2019  0949   AST 13 04/25/2017 0805   ALT 24 12/13/2019 0543   ALT 8 10/29/2019 0949   ALT 15 04/25/2017 0805   ALKPHOS 61 12/13/2019 0543   ALKPHOS 79 04/25/2017 0805   BILITOT 0.4 12/13/2019 0543   BILITOT 0.4 10/29/2019 0949   BILITOT 0.47 04/25/2017 0805   GFRNONAA >60 12/16/2019 0528   GFRNONAA >60 10/29/2019 0949   GFRAA >60 12/16/2019 0528   GFRAA >60 10/29/2019 0949   Lab Results  Component Value Date   WBC 9.8 12/16/2019   NEUTROABS 9.2 (H) 12/13/2019   HGB 13.2 12/16/2019   HCT 38.6 12/16/2019   MCV 89.8 12/16/2019   PLT 251 12/16/2019    Imaging:  MR Brain W Wo Contrast  Addendum Date: 01/20/2020   ADDENDUM REPORT: 01/20/2020 07:42 ADDENDUM: Interval subcentimeter nodular area of enhancement along the ependymal surface of the atrium of the right lateral ventricle, which was discussed in tumor board this morning. Electronically Signed   By: Monte Fantasia M.D.   On: 01/20/2020 07:42   Addendum Date: 01/17/2020   ADDENDUM REPORT: 01/17/2020 14:13 ADDENDUM: Interval resolution of left parietal scalp soft tissue swelling due to contusion. Resolution of bilateral mastoid effusion. Electronically Signed   By: Franchot Gallo M.D.   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 Post SRS. Restaging. EXAM: MRI HEAD WITHOUT AND WITH CONTRAST TECHNIQUE: Multiplanar, multiecho pulse sequences of the brain and surrounding structures were obtained without and with intravenous contrast. CONTRAST:  33mL MULTIHANCE GADOBENATE DIMEGLUMINE 529 MG/ML IV SOLN COMPARISON:  MRI head 11/28/2019 FINDINGS: BRAIN New Lesions: None. Larger lesions: Large mass in the cerebellar vermis is larger now measuring 4.9 x 3.3 cm, compared with 3.3 x 3.0 cm. Extensive central necrosis. Surrounding edema is similar. Multiple foci of microhemorrhage in the lesion better seen on the current study. Right inferior cerebellar hemispheric lesion shows progression  of enhancement. There is underlying hemorrhage in this lesion. The enhancing component now measures approximately 16 x 22 mm. Improvement in surrounding edema. Right anterior cerebellar lesion is larger now measuring 11 x 9 mm, previously 7.5 x 6 mm. Surrounding edema similar. Superior cerebellar vermis lesion slightly larger now measuring 15 x 10 mm compared with 12 x 10 mm. Central necrosis. Surrounding edema unchanged. Stable or Smaller lesions: Meningeal enhancement in the right posterior temporal lobe just above  the mastoid sinus is unchanged. Generalized atrophy. Mild chronic microvascular ischemic change in the white matter. Marland Kitchen IMPRESSION: 1. Progression of multiple enhancing mass lesions in the posterior fossa which may be due to tumor progression or pseudoprogression. 2. Leptomeningeal enhancement in the right posterior temporal lobe is unchanged. This corresponds to a hemorrhagic contusion on the prior CT of 11/27/2019 3. No new lesions identified. Electronically Signed: By: Franchot Gallo M.D. On: 01/17/2020 13:20    Assessment/Plan Metastatic cancer to brain [facility]) [C79.31]  Ms. Reierson presents today with clinical and radiographic progression within the posterior fossa.  Large treated vermian lesion is significantly larger and is the main contributor to ataxia and dysarthria.  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  Alternately this could be organic tumor which is highly refractory to radiosurgery.  There are 3 additional lesions in the right cerebellar hemisphere which are new and untreated.  We offered palliative radiosurgery to address these metastases.  This had been discussed in brain/spine tumor board meeting.  Care will be taken to avoid further radiation exposure to dominant vermian lesion.  We also discussed palliative/comfort measures which she is not  interested in at this time.  For radiation necrosis, recommend continuing 4mg  daily decadron, and will add Trental+Vitamin E therapy.   Keppra will be discontinued due to lack of seizures, no cortically based lesions, and mood lability with fatigue as side effect.  Re-referral will be placed for Dr. Isidore Moos and CT-sim to address upcoming planned radiosurgery. She should return to clinic on 03/02/20 following next visit with Dr. Julien Nordmann, for further management and steroid adjustment following SRS.  We appreciate the opportunity to participate in the care of BRYNLEY CUDDEBACK.    All questions were answered. The patient knows to call the clinic with any problems, questions or concerns. No barriers to learning were detected.  The total time spent in the encounter was 40 minutes and more than 50% was on counseling and review of test results   Ventura Sellers, MD Medical Director of Neuro-Oncology [facility] [place] 01/20/20 3:57 PM

## 2020-01-20 NOTE — Telephone Encounter (Signed)
Scheduled appt per 3/15 los.  Sent a message to HIM pool to get a calendar mailed out.

## 2020-01-20 NOTE — Addendum Note (Signed)
Addended by: Ventura Sellers on: 01/20/2020 04:49 PM   Modules accepted: Orders

## 2020-01-21 NOTE — Progress Notes (Signed)
Has armband been applied?  N/A  Does patient have an allergy to IV contrast dye?: No   Has patient ever received premedication for IV contrast dye?: N/A  Does patient take metformin?: No  If patient does take metformin when was the last dose: N/A  Date of lab work: 12/16/19 BUN: 19 CR: 0.80 EGFR: >60  IV site: Unable to get IV site. Three attempts made for IV without success.  Right AC, Left AC, Right wrist.   Has IV site been added to flowsheet?  N/A

## 2020-01-22 ENCOUNTER — Ambulatory Visit: Payer: Medicare HMO

## 2020-01-22 ENCOUNTER — Ambulatory Visit: Payer: Medicare HMO | Admitting: Radiation Oncology

## 2020-01-23 NOTE — Progress Notes (Signed)
Location/Histology of Brain Tumor:  Right cerebellar hemisphere with three additional lesions which are new and untreated per Dr. Renda Rolls 01/20/20 note.   Patient presented with symptoms of:  She was recently discharged from inpatient rehabilitation and continues to suffer with difficulty walking, balance issues.   Past or anticipated interventions, if any, per neurosurgery: N/A  Past or anticipated interventions, if any, per medical oncology:  01/20/20 Dr. Mickeal Skinner Assessment/Plan Metastatic cancer to brain [facility]) [C79.31]  Ms. Mccampbell presents today with clinical and radiographic progression within the posterior fossa.  Large treated vermian lesion is significantly larger and is the main contributor to ataxia and dysarthria.  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  Alternately this could be organic tumor which is highly refractory to radiosurgery.  There are 3 additional lesions in the right cerebellar hemisphere which are new and untreated.  We offered palliative radiosurgery to address these metastases.  This had been discussed in brain/spine tumor board meeting.  Care will be taken to avoid further radiation exposure to dominant vermian lesion.  We also discussed palliative/comfort measures which she is not interested in at this time.  For radiation necrosis, recommend continuing 4mg  daily decadron, and will add Trental+Vitamin E therapy.   Keppra will be discontinued due to lack of seizures, no cortically based lesions, and mood lability with fatigue as side effect.  Re-referral will be placed for Dr. Isidore Moos and CT-sim to address upcoming planned radiosurgery. She should return to clinic on 03/02/20 following next visit with Dr. Julien Nordmann, for further management and steroid adjustment following SRS.   Dose of Decadron, if applicable: 4 mg daily.    Recent neurologic symptoms, if any:   Seizures: No  Headaches: No  Nausea: Yes, ? Related to indigestion.   Dizziness/ataxia: Yes, when she first stands to walk from a sitting postion.   Difficulty with hand coordination: No  Focal numbness/weakness: Yes  Visual deficits/changes: Yes, she closes one eye often to help her see better.   Confusion/Memory deficits: She does report memory deficits.   Painful bone metastases at present, if any: N/A  SAFETY ISSUES:  Prior radiation? Yes,  Radiation Treatment Dates: 07/16/2019 through 07/22/2019 Site Technique Total Dose Dose per Fx Completed Fx Beam Energies  Brain: Brain IMRT 27/27 9 3/3 [id number]     Pacemaker/ICD? No  Possible current pregnancy? No  Is the patient on methotrexate? No  Additional Complaints / other details:   BP 115/69   Pulse 90   Temp 98.9 F (37.2 C) (Tympanic)   Resp 18   SpO2 100%

## 2020-01-24 ENCOUNTER — Encounter: Payer: Self-pay | Admitting: Radiation Oncology

## 2020-01-24 ENCOUNTER — Ambulatory Visit
Admission: RE | Admit: 2020-01-24 | Discharge: 2020-01-24 | Disposition: A | Discharge status: Home / Self Care | Payer: Medicare HMO | Source: Ambulatory Visit | Attending: Radiation Oncology | Admitting: Radiation Oncology

## 2020-01-24 ENCOUNTER — Other Ambulatory Visit: Payer: Self-pay

## 2020-01-24 VITALS — BP 115/69 | HR 90 | Temp 98.9°F | Resp 18

## 2020-01-24 DIAGNOSIS — R110 Nausea: Secondary | ICD-10-CM | POA: Insufficient documentation

## 2020-01-24 DIAGNOSIS — Z87891 Personal history of nicotine dependence: Secondary | ICD-10-CM | POA: Diagnosis not present

## 2020-01-24 DIAGNOSIS — C7931 Secondary malignant neoplasm of brain: Secondary | ICD-10-CM | POA: Insufficient documentation

## 2020-01-24 DIAGNOSIS — E785 Hyperlipidemia, unspecified: Secondary | ICD-10-CM | POA: Insufficient documentation

## 2020-01-24 DIAGNOSIS — Z79899 Other long term (current) drug therapy: Secondary | ICD-10-CM | POA: Insufficient documentation

## 2020-01-24 DIAGNOSIS — C3490 Malignant neoplasm of unspecified part of unspecified bronchus or lung: Secondary | ICD-10-CM | POA: Insufficient documentation

## 2020-01-24 DIAGNOSIS — Z923 Personal history of irradiation: Secondary | ICD-10-CM | POA: Diagnosis not present

## 2020-01-24 DIAGNOSIS — G629 Polyneuropathy, unspecified: Secondary | ICD-10-CM | POA: Insufficient documentation

## 2020-01-24 DIAGNOSIS — R531 Weakness: Secondary | ICD-10-CM | POA: Diagnosis not present

## 2020-01-24 DIAGNOSIS — Z86718 Personal history of other venous thrombosis and embolism: Secondary | ICD-10-CM | POA: Diagnosis not present

## 2020-01-24 MED ORDER — SODIUM CHLORIDE 0.9% FLUSH: 10.0000 mL | Freq: Once | INTRAVENOUS | Status: DC

## 2020-01-27 ENCOUNTER — Telehealth: Payer: Self-pay | Admitting: *Deleted

## 2020-01-27 ENCOUNTER — Encounter: Payer: Self-pay | Admitting: *Deleted

## 2020-01-27 NOTE — Telephone Encounter (Signed)
[place] Work  Clinical Social Work was referred by neuro-oncology for assessment of psychosocial needs- patient's son [name] requested phone call.  Clinical Social Worker left voicemail for patient's son to follow up.   Gwinda Maine, LCSW  Clinical Social Worker  [facility]

## 2020-01-27 NOTE — Telephone Encounter (Signed)
Patients son [name] called to report that patient had her [place] Sim on this past Friday.  Over the weekend she had an episode of syncope and since has recovered and is responsive and is stable. Denies hitting head during.   No other concerns at this time.  Son wanted to make MD aware since patient is due to have Seltzer this coming Friday.  They are concerned about if this will be an issue after treatment and what they should do.  Also wanted Social worker to reach out to them to reassess in home needs.  Gwinda Maine previously assisted, will route to her to determine which SW will assist from here out.  Routed to MD to advise if patient needs to be seen prior to Friday.

## 2020-01-27 NOTE — Progress Notes (Signed)
[place] Work  Clinical Social Work spoke with patient's son, [name], by phone.  Patient's son shared current home situation- patient recently discharged from [facility], unable to live independently, relies on family support for safe ADLs.    CSW explored patient's qualify of life, community benefits available through insurance, and palliative or Hospice services.  CSW recommended patient's son contact [employer] to inquire about home care resources.  CSW will send information on self-pay home care services (not typically covered by insurance).  CSW contacted [facility] and requested [facility] team member contact patient's son directly and discuss Hospice services as potential supportive care for patient/family.  CSW shared family discussion with Dr. Mickeal Skinner and Rhae Hammock, RN.  Patient's son plans to follow up with CSW after learning more about [facility]. CSW will continue to support patient/family throughout cancer experience.  Gwinda Maine, LCSW  Clinical Social Worker [facility]

## 2020-01-27 NOTE — Telephone Encounter (Signed)
Per Dr. Mickeal Skinner if patient has another syncope episode prior to [facility] treatment this Friday patient needs to be evaluated in person either Thursday or Friday.    Made son [name] aware.

## 2020-01-28 ENCOUNTER — Telehealth: Payer: Self-pay | Admitting: Radiation Therapy

## 2020-01-28 ENCOUNTER — Encounter: Payer: Self-pay | Admitting: Radiation Oncology

## 2020-01-28 ENCOUNTER — Other Ambulatory Visit: Payer: Self-pay | Admitting: Radiation Oncology

## 2020-01-28 DIAGNOSIS — C7931 Secondary malignant neoplasm of brain: Secondary | ICD-10-CM

## 2020-01-28 MED ORDER — OMEPRAZOLE 20 MG PO CPDR: 20.0000 mg | DELAYED_RELEASE_CAPSULE | Freq: Every day | ORAL | 1 refills | Status: DC

## 2020-01-28 NOTE — Progress Notes (Signed)
Radiation Oncology         [phone] ________________________________  Outpatient Re-Consultation in person  Name: Hailey Hill MRN: [id number]  Date: 01/24/2020  DOB: 01-May-1951  KV:[id number], Hailey Millin, NP  Hailey Shape, NP   REFERRING PHYSICIAN: Maryanna Shape, NP  DIAGNOSIS: The primary encounter diagnosis was Metastatic cancer to brain [facility]). 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   ICD-10-CM   1. Metastatic cancer to brain [facility])  C79.31   2. 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 female who returns today for re-evaluation. 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 I then saw her as an inpatient for brain metastases. In summary, from my original consult note, she was in remission when in 06/2019, she noted dizziness and fullness in her ear. She started to fall and believed she has blacked out as she couldn't remember the actual act of falling on certain occasions.  She was admitted to the ED after a bad fall resulting in left humeral fracture. MRI of brain showed two large cerebellar lesions c/w metastases. The smaller lesion is in the superior vermis, approximately 1.5 x 2 x 2 cm. The larger lesion is in the midline to slight inferior vermis extending into the RIGHT cerebellar hemisphere, approximately 2.5 x 3 x 3 cm. There is moderate surrounding edema. And effacement of the fourth ventricle. She received 27 Gy in 3 fractions to the two cerebellar metastases using SRS, completed 07/22/2019. Follow up brain MRI from 10/17/2019 showed improvement in the treated lesions and no new mass or abnormal enhancement.  Since then, she was taken to the ER on 11/27/2019 with concern for a stroke. Head CT showed a bleed into the metastatic lesion. It was thought she most likely had a seizure then  fell, as she also had a scalp hematoma and small subarachnoid hemorrhage. This was followed by a brain MRI showing severe cerebellar edema and new contrast-enhancing lesions in the right cerebellar hemisphere c/w metastasis.  I reviewed her imaging with her team at tumor board.  The consensus was to allow more time for her brain to heal from the hemorrhage, allow the blood products to resolve, and for the patient to heal physically overall and to rescan her brain in March.    She underwent repeat brain MRI on 01/17/2020.  I reviewed her images personally with radiology and they were also reviewed as a team at tumor board.  At the midline of the cerebellum the two lesions that were previously treated with radiosurgery are felt to be consistent with treatment effect/radiation necrosis.  She does have a new lesion ependymal surface of the atrium of the right lateral ventricle, as well as three new lesions in the right cerebellum that have not yet been treated with radiosurgery.  The consensus at CNS tumor board is to target these lesions with radiosurgery if the patient would like to be aggressive in her care.  Under the care of Dr. Mickeal Hill she is taking vitamin E Trental and Decadron.  Keppra was discontinued due to side effects and lack of seizures.  She continues to follow with medical oncology.  She denies any seizures or headaches.  She has some nausea which she thinks may be related to indigestion.  She has some dizziness when  she first stands up.  She reports some weakness on the left side of her body.  She reports that she closes one eye often to help her see better and her son states that she has not had her glasses revised or her vision checked recently.  She does have some memory deficits.  She is here with her son today.    PREVIOUS RADIATION THERAPY: Yes  10/04/10 - 11/19/10: Chest / 60 Gy in 30 fractions 12/07/10 - 12/20/10: PCI / 25 Gy in 10 fractions 07/16/2019 - 07/22/2019: Two Brain Mets (SRS) / 27  Gy in 3 fractions  PAST MEDICAL HISTORY:  has a past medical history of Complication of anesthesia, Dyslipidemia, History of radiation therapy (07/16/19, 07/19/19, 07/22/19), Lung cancer ([place]) (dx'd 07/2010), Peripheral neuropathy, SVC syndrome, and Thrombus (10/21/2010).    PAST SURGICAL HISTORY: Past Surgical History:  Procedure Laterality Date  . CERVICAL BIOPSY    . CHOLECYSTECTOMY N/A 07/25/2016   Procedure: LAPAROSCOPIC CHOLECYSTECTOMY;  Surgeon: Coralie Keens, MD;  Location: [place];  Service: General;  Laterality: N/A;  . TONSILLECTOMY    . TUBAL LIGATION      FAMILY HISTORY: family history includes Heart attack in her father, paternal grandfather, and paternal uncle; Nephrolithiasis in her son.  SOCIAL HISTORY:  reports that she quit smoking about 9 years ago. She has never used smokeless tobacco. She reports that she does not drink alcohol or use drugs.  ALLERGIES: Promethazine hcl  MEDICATIONS:  Current Outpatient Medications  Medication Sig Dispense Refill  . dexamethasone (DECADRON) 4 MG tablet Take 1 tablet (4 mg total) by mouth daily. 30 tablet 0  . pentoxifylline (TRENTAL) 400 MG CR tablet Take 1 tablet (400 mg total) by mouth 3 (three) times daily with meals. 90 tablet 3  . vitamin E (VITAMIN E) 180 MG (400 UNITS) capsule Take 1 capsule (400 Units total) by mouth daily. 30 capsule 3  . acetaminophen (TYLENOL) 325 MG tablet Take 1-2 tablets (325-650 mg total) by mouth every 4 (four) hours as needed for mild pain. (Patient not taking: Reported on 12/20/2019)    . docusate sodium (COLACE) 100 MG capsule Take 1 capsule (100 mg total) by mouth daily. (Patient not taking: Reported on 12/20/2019) 30 capsule 0  . vitamin B-12 (CYANOCOBALAMIN) 1000 MCG tablet Take 1,000 mcg by mouth daily.     No current facility-administered medications for this encounter.    REVIEW OF SYSTEMS:   As above  PHYSICAL EXAM:  tympanic temperature is 98.9 F (37.2 C). Her blood  pressure is 115/69 and her pulse is 90. Her respiration is 18 and oxygen saturation is 100%.    General: she is sitting in a wheelchair and in no acute distress. MSK: Strength is slightly asymmetric with discernible weakness in the left arm. HEENT: No oral thrush NEURO: As above; speech is not slurred.  She is alert and oriented.    LABORATORY DATA:  Lab Results  Component Value Date   WBC 9.8 12/16/2019   HGB 13.2 12/16/2019   HCT 38.6 12/16/2019   MCV 89.8 12/16/2019   PLT 251 12/16/2019   CMP     Component Value Date/Time   NA 138 12/16/2019 0528   NA 142 04/25/2017 0805   K 3.9 12/16/2019 0528   K 4.5 04/25/2017 0805   CL 99 12/16/2019 0528   CL 108 (H) 01/09/2013 0913   CO2 24 12/16/2019 0528   CO2 26 04/25/2017 0805   GLUCOSE 92 12/16/2019 0528  GLUCOSE 95 04/25/2017 0805   GLUCOSE 101 (H) 01/09/2013 0913   BUN 19 12/16/2019 0528   BUN 12.8 04/25/2017 0805   CREATININE 0.80 12/16/2019 0528   CREATININE 0.77 10/29/2019 0949   CREATININE 0.9 04/25/2017 0805   CALCIUM 9.2 12/16/2019 0528   CALCIUM 9.6 04/25/2017 0805   PROT 5.4 (L) 12/13/2019 0543   PROT 6.6 04/25/2017 0805   ALBUMIN 3.1 (L) 12/13/2019 0543   ALBUMIN 3.8 04/25/2017 0805   AST 15 12/13/2019 0543   AST 11 (L) 10/29/2019 0949   AST 13 04/25/2017 0805   ALT 24 12/13/2019 0543   ALT 8 10/29/2019 0949   ALT 15 04/25/2017 0805   ALKPHOS 61 12/13/2019 0543   ALKPHOS 79 04/25/2017 0805   BILITOT 0.4 12/13/2019 0543   BILITOT 0.4 10/29/2019 0949   BILITOT 0.47 04/25/2017 0805   GFRNONAA >60 12/16/2019 0528   GFRNONAA >60 10/29/2019 0949   GFRAA >60 12/16/2019 0528   GFRAA >60 10/29/2019 0949         RADIOGRAPHY: MR Brain W Wo Contrast  Addendum Date: 01/20/2020   ADDENDUM REPORT: 01/20/2020 07:42 ADDENDUM: Interval subcentimeter nodular area of enhancement along the ependymal surface of the atrium of the right lateral ventricle, which was discussed in tumor board this morning. Electronically  Signed   By: Monte Fantasia M.D.   On: 01/20/2020 07:42   Addendum Date: 01/17/2020   ADDENDUM REPORT: 01/17/2020 14:13 ADDENDUM: Interval resolution of left parietal scalp soft tissue swelling due to contusion. Resolution of bilateral mastoid effusion. Electronically Signed   By: Franchot Gallo M.D.   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 Post SRS. Restaging. EXAM: MRI HEAD WITHOUT AND WITH CONTRAST TECHNIQUE: Multiplanar, multiecho pulse sequences of the brain and surrounding structures were obtained without and with intravenous contrast. CONTRAST:  84mL MULTIHANCE GADOBENATE DIMEGLUMINE 529 MG/ML IV SOLN COMPARISON:  MRI head 11/28/2019 FINDINGS: BRAIN New Lesions: None. Larger lesions: Large mass in the cerebellar vermis is larger now measuring 4.9 x 3.3 cm, compared with 3.3 x 3.0 cm. Extensive central necrosis. Surrounding edema is similar. Multiple foci of microhemorrhage in the lesion better seen on the current study. Right inferior cerebellar hemispheric lesion shows progression of enhancement. There is underlying hemorrhage in this lesion. The enhancing component now measures approximately 16 x 22 mm. Improvement in surrounding edema. Right anterior cerebellar lesion is larger now measuring 11 x 9 mm, previously 7.5 x 6 mm. Surrounding edema similar. Superior cerebellar vermis lesion slightly larger now measuring 15 x 10 mm compared with 12 x 10 mm. Central necrosis. Surrounding edema unchanged. Stable or Smaller lesions: Meningeal enhancement in the right posterior temporal lobe just above the mastoid sinus is unchanged. Generalized atrophy. Mild chronic microvascular ischemic change in the white matter. Marland Kitchen IMPRESSION: 1. Progression of multiple enhancing mass lesions in the posterior fossa which may be due to tumor progression or pseudoprogression. 2. Leptomeningeal enhancement in the right posterior temporal lobe is unchanged. This  corresponds to a hemorrhagic contusion on the prior CT of 11/27/2019 3. No new lesions identified. Electronically Signed: By: Franchot Gallo M.D. On: 01/17/2020 13:20      IMPRESSION/PLAN: This is a very pleasant woman with history of brain metastases   She underwent repeat brain MRI on 01/17/2020.  I reviewed her images personally with radiology and they were also reviewed as a team at tumor board.  At the midline of the cerebellum  the two lesions that were previously treated with radiosurgery are felt to be consistent with treatment effect/radiation necrosis.  She does have a new lesion ependymal surface of the atrium of the right lateral ventricle, as well as three new lesions in the right cerebellum that have not yet been treated with radiosurgery.  The consensus at CNS tumor board is to target these lesions with radiosurgery if the patient would like to be aggressive in her care.  She understands that she also has the option of best supportive care but she would like to continue to treat active sites of disease.  She understands that radiosurgery carries the risk of further inflammation or necrosis of tissue in the brain which can carry additional side effects.  Nevertheless, she would like to proceed with treatment.  Consent form was signed and we will proceed with simulation today.  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  For her indigestion, I have prescribed omeprazole to take while she is on dexamethasone for GI prophylaxis.  On date of service, in total, I spent 30 minutes on this encounter; patient was seen in person with her son. __________________________________________   Eppie Gibson, MD  This document serves as a record of services personally performed by Eppie Gibson, MD. It was created on her behalf by Wilburn Mylar, a trained medical scribe. The creation of this record is based on the scribe's personal  observations and the provider's statements to them. This document has been checked and approved by the attending provider.

## 2020-01-28 NOTE — Telephone Encounter (Signed)
I spoke with Ms. Schuenemann's son, [name], to let him know that Dr. Isidore Moos has called in a prescription for omeprazole with help with his mother's indigestion. The prescription has been called into the [facility] on [address]. He is with her today and plans to pick it up so she can start taking it right away.    Mont Dutton R.T.(R)(T) Radiation Special Procedures Navigator

## 2020-01-29 DIAGNOSIS — I2699 Other pulmonary embolism without acute cor pulmonale: Secondary | ICD-10-CM | POA: Diagnosis not present

## 2020-01-29 DIAGNOSIS — R55 Syncope and collapse: Secondary | ICD-10-CM | POA: Diagnosis not present

## 2020-01-31 ENCOUNTER — Emergency Department (HOSPITAL_COMMUNITY): Payer: Medicare HMO

## 2020-01-31 ENCOUNTER — Encounter (HOSPITAL_COMMUNITY): Payer: Self-pay | Admitting: Emergency Medicine

## 2020-01-31 ENCOUNTER — Emergency Department (HOSPITAL_BASED_OUTPATIENT_CLINIC_OR_DEPARTMENT_OTHER)
Admission: EM | Admit: 2020-01-31 | Discharge: 2020-01-31 | Disposition: A | Discharge status: Home / Self Care | Payer: Medicare HMO | Source: Home / Self Care | Attending: Emergency Medicine | Admitting: Emergency Medicine

## 2020-01-31 ENCOUNTER — Inpatient Hospital Stay (HOSPITAL_BASED_OUTPATIENT_CLINIC_OR_DEPARTMENT_OTHER): Payer: Medicare HMO | Admitting: Internal Medicine

## 2020-01-31 ENCOUNTER — Ambulatory Visit
Admission: RE | Admit: 2020-01-31 | Discharge: 2020-01-31 | Disposition: A | Discharge status: Home / Self Care | Payer: Medicare HMO | Source: Ambulatory Visit | Attending: Radiation Oncology | Admitting: Radiation Oncology

## 2020-01-31 ENCOUNTER — Inpatient Hospital Stay (HOSPITAL_COMMUNITY)
Admission: EM | Admit: 2020-01-31 | Discharge: 2020-02-03 | DRG: 175 | Disposition: A | Discharge status: Hospice | Payer: Medicare HMO | Source: Ambulatory Visit | Attending: Internal Medicine | Admitting: Internal Medicine

## 2020-01-31 ENCOUNTER — Other Ambulatory Visit: Payer: Self-pay

## 2020-01-31 DIAGNOSIS — Z66 Do not resuscitate: Secondary | ICD-10-CM | POA: Diagnosis not present

## 2020-01-31 DIAGNOSIS — I629 Nontraumatic intracranial hemorrhage, unspecified: Secondary | ICD-10-CM

## 2020-01-31 DIAGNOSIS — Z8249 Family history of ischemic heart disease and other diseases of the circulatory system: Secondary | ICD-10-CM | POA: Diagnosis not present

## 2020-01-31 DIAGNOSIS — Z515 Encounter for palliative care: Secondary | ICD-10-CM | POA: Diagnosis not present

## 2020-01-31 DIAGNOSIS — Z87891 Personal history of nicotine dependence: Secondary | ICD-10-CM | POA: Diagnosis not present

## 2020-01-31 DIAGNOSIS — Z7952 Long term (current) use of systemic steroids: Secondary | ICD-10-CM | POA: Diagnosis not present

## 2020-01-31 DIAGNOSIS — C7931 Secondary malignant neoplasm of brain: Secondary | ICD-10-CM

## 2020-01-31 DIAGNOSIS — Z888 Allergy status to other drugs, medicaments and biological substances status: Secondary | ICD-10-CM | POA: Diagnosis not present

## 2020-01-31 DIAGNOSIS — M7989 Other specified soft tissue disorders: Secondary | ICD-10-CM

## 2020-01-31 DIAGNOSIS — G629 Polyneuropathy, unspecified: Secondary | ICD-10-CM | POA: Diagnosis present

## 2020-01-31 DIAGNOSIS — I619 Nontraumatic intracerebral hemorrhage, unspecified: Secondary | ICD-10-CM | POA: Diagnosis present

## 2020-01-31 DIAGNOSIS — I2699 Other pulmonary embolism without acute cor pulmonale: Secondary | ICD-10-CM | POA: Diagnosis present

## 2020-01-31 DIAGNOSIS — I82451 Acute embolism and thrombosis of right peroneal vein: Secondary | ICD-10-CM | POA: Diagnosis present

## 2020-01-31 DIAGNOSIS — C3490 Malignant neoplasm of unspecified part of unspecified bronchus or lung: Secondary | ICD-10-CM | POA: Diagnosis present

## 2020-01-31 DIAGNOSIS — Z20822 Contact with and (suspected) exposure to covid-19: Secondary | ICD-10-CM | POA: Diagnosis present

## 2020-01-31 DIAGNOSIS — Z7189 Other specified counseling: Secondary | ICD-10-CM | POA: Diagnosis not present

## 2020-01-31 DIAGNOSIS — R55 Syncope and collapse: Secondary | ICD-10-CM | POA: Diagnosis present

## 2020-01-31 DIAGNOSIS — E785 Hyperlipidemia, unspecified: Secondary | ICD-10-CM | POA: Diagnosis present

## 2020-01-31 DIAGNOSIS — I2694 Multiple subsegmental pulmonary emboli without acute cor pulmonale: Secondary | ICD-10-CM

## 2020-01-31 LAB — BASIC METABOLIC PANEL
Anion gap: 8 (ref 5–15)
BUN: 20 mg/dL (ref 8–23)
CO2: 24 mmol/L (ref 22–32)
Calcium: 8.7 mg/dL — ABNORMAL LOW (ref 8.9–10.3)
Chloride: 105 mmol/L (ref 98–111)
Creatinine, Ser: 0.84 mg/dL (ref 0.44–1.00)
GFR calc Af Amer: >60 mL/min (ref >60)
GFR calc non Af Amer: >60 mL/min (ref >60)
Glucose, Bld: 105 mg/dL — ABNORMAL HIGH (ref 70–99)
Potassium: 4.1 mmol/L (ref 3.5–5.1)
Sodium: 137 mmol/L (ref 135–145)

## 2020-01-31 LAB — CBC WITH DIFFERENTIAL/PLATELET
Abs Immature Granulocytes: 0.48 10*3/uL — ABNORMAL HIGH (ref 0.00–0.07)
Basophils Absolute: 0 10*3/uL (ref 0.0–0.1)
Basophils Relative: 0 %
Eosinophils Absolute: 0 10*3/uL (ref 0.0–0.5)
Eosinophils Relative: 0 %
HCT: 39.3 % (ref 36.0–46.0)
Hemoglobin: 13.4 g/dL (ref 12.0–15.0)
Immature Granulocytes: 4 %
Lymphocytes Relative: 4 %
Lymphs Abs: 0.4 10*3/uL — ABNORMAL LOW (ref 0.7–4.0)
MCH: 31.2 pg (ref 26.0–34.0)
MCHC: 34.1 g/dL (ref 30.0–36.0)
MCV: 91.6 fL (ref 80.0–100.0)
Monocytes Absolute: 0.4 10*3/uL (ref 0.1–1.0)
Monocytes Relative: 3 %
Neutro Abs: 9.8 10*3/uL — ABNORMAL HIGH (ref 1.7–7.7)
Neutrophils Relative %: 89 %
Platelets: 172 10*3/uL (ref 150–400)
RBC: 4.29 MIL/uL (ref 3.87–5.11)
RDW: 15.3 % (ref 11.5–15.5)
WBC: 11.1 10*3/uL — ABNORMAL HIGH (ref 4.0–10.5)
nRBC: 0 % (ref 0.0–0.2)

## 2020-01-31 LAB — LACTIC ACID, PLASMA
Lactic Acid, Venous: 1.8 mmol/L (ref 0.5–1.9)
Lactic Acid, Venous: 1.3 mmol/L (ref 0.5–1.9)

## 2020-01-31 LAB — BRAIN NATRIURETIC PEPTIDE: B Natriuretic Peptide: 46.3 pg/mL (ref 0.0–100.0)

## 2020-01-31 LAB — PROTIME-INR
INR: 1 (ref 0.8–1.2)
Prothrombin Time: 12.8 seconds (ref 11.4–15.2)

## 2020-01-31 LAB — SARS CORONAVIRUS 2 (TAT 6-24 HRS): SARS Coronavirus 2: NEGATIVE

## 2020-01-31 LAB — TROPONIN I (HIGH SENSITIVITY): Troponin I (High Sensitivity): 3 ng/L (ref <18)

## 2020-01-31 LAB — CBG MONITORING, ED: Glucose-Capillary: 101 mg/dL — ABNORMAL HIGH (ref 70–99)

## 2020-01-31 LAB — APTT: aPTT: 21 seconds — ABNORMAL LOW (ref 24–36)

## 2020-01-31 MED ORDER — SODIUM CHLORIDE (PF) 0.9 % IJ SOLN
INTRAMUSCULAR | Status: AC
  Filled 2020-01-31: qty 50

## 2020-01-31 MED ORDER — PENTOXIFYLLINE ER 400 MG PO TBCR
400.0000 mg | EXTENDED_RELEASE_TABLET | Freq: Three times a day (TID) | ORAL | Status: DC
  Administered 2020-01-31 – 2020-02-03 (×7): 400 mg via ORAL
  Filled 2020-01-31 (×10): qty 1

## 2020-01-31 MED ORDER — VITAMIN E 180 MG (400 UNIT) PO CAPS
400.0000 [IU] | ORAL_CAPSULE | Freq: Every day | ORAL | Status: DC
  Administered 2020-02-01 – 2020-02-03 (×3): 400 [IU] via ORAL
  Filled 2020-01-31 (×3): qty 1

## 2020-01-31 MED ORDER — DOCUSATE SODIUM 100 MG PO CAPS
100.0000 mg | ORAL_CAPSULE | Freq: Every day | ORAL | Status: DC
  Administered 2020-01-31 – 2020-02-03 (×4): 100 mg via ORAL
  Filled 2020-01-31 (×4): qty 1

## 2020-01-31 MED ORDER — DEXAMETHASONE 4 MG PO TABS
4.0000 mg | ORAL_TABLET | Freq: Every day | ORAL | Status: DC
  Administered 2020-02-01 – 2020-02-03 (×3): 4 mg via ORAL
  Filled 2020-01-31 (×3): qty 1

## 2020-01-31 MED ORDER — PANTOPRAZOLE SODIUM 40 MG PO TBEC
40.0000 mg | DELAYED_RELEASE_TABLET | Freq: Every day | ORAL | Status: DC
  Administered 2020-02-01 – 2020-02-03 (×3): 40 mg via ORAL
  Filled 2020-01-31 (×3): qty 1

## 2020-01-31 MED ORDER — ONDANSETRON HCL 4 MG PO TABS: 4.0000 mg | ORAL_TABLET | Freq: Four times a day (QID) | ORAL | Status: DC | PRN

## 2020-01-31 MED ORDER — ONDANSETRON HCL 4 MG/2ML IJ SOLN: 4.0000 mg | Freq: Four times a day (QID) | INTRAMUSCULAR | Status: DC | PRN

## 2020-01-31 MED ORDER — ACETAMINOPHEN 325 MG PO TABS: 325.0000 mg | ORAL_TABLET | ORAL | Status: DC | PRN

## 2020-01-31 MED ORDER — IOHEXOL 350 MG/ML SOLN
100.0000 mL | Freq: Once | INTRAVENOUS | Status: AC | PRN
  Administered 2020-01-31: 100 mL via INTRAVENOUS

## 2020-01-31 NOTE — Progress Notes (Signed)
Bilateral lower extremity venous duplex has been completed. Preliminary results can be found in CV Proc through chart review.  Results were given to Dr. Regenia Skeeter.  01/31/20 1:27 PM Carlos Levering RVT

## 2020-01-31 NOTE — H&P (Addendum)
History and Physical    EPIFANIA LITTRELL ZJQ:[id number] DOB: 1951/01/20 DOA: 01/31/2020  PCP: [name], NP   Patient coming from: Home  I have personally briefly reviewed patient's old medical records in [place]  Chief Complaint: "I passed out" Most of the history was obtained from patient's son who was at the bedside  HPI: ZAIN BINGMAN is a 69 y.o. female with medical history significant for SCLC with SVC syndrome, S/P Fall with humerus Fx 06/2019, history of cerebellar mets treated with XRT, left basal and subclavian vein thrombosis, peripheral neuropathy, who was sent to the emergency room from her oncologist's office for evaluation of a witnessed syncopal episode.  Patient was scheduled for radiation therapy for known brain mets today and while at the oncologist's office she had a witnessed syncopal episode.  She was supported by the radiation oncology staff who prevented her from falling to the ground. Her son who accompanied her to the visit today says she has had 2 episodes in the last 1 week, episodes were witnessed and her husband was able to support her for she fell completely to the ground. Patient's son thought she had a transient loss of consciousness during these episodes and patient has full recall of the episodes. She states that her legs go limp and she falls. She also has issues with balance and dizziness and her known metastatic disease to the brain.  Patient had a CT scan of the head done without contrast which showed extensive edema within the bilateral cerebellar hemispheres at site of multiple known cerebellar metastases.There is a new 7 mm focus of parenchymal hemorrhage within the superomedial left cerebellum which is in close proximity to a known metastatic lesion. She had a CT angiogram of the chest which showed extensive bilateral pulmonary emboli with involvement of the right and left main branch pulmonary arteries with clot extending into lobar and  segmental branches of all lobes. No saddle embolism. RV to LV ratio of 1.1 with slight bowing of the intraventricular septum suggesting right heart strain. Right lower extremity Doppler showed findings consistent with acute deep vein thrombosis involving the right peroneal veins.  Patient denies having any chest pain, shortness of breath, nausea, vomiting, cough, fever or chills, no urinary symptoms or changes in her bowel habits  ED Course: Patient was sent to the emergency room for evaluation of a witnessed syncopal episode, she has had 2 prior episodes at home.  CT angiogram of the chest was done which showed extensive pulmonary emboli with RV strain.  Patient had a CT scan of the head done which showed an area of  7 mm focus of parenchymal hemorrhage within the superomedial left cerebellum which is in close proximity to a known metastatic lesion within the midline cerebellum.  Patient is unable to be placed on anticoagulation due to findings on the CT scan of the head   Review of Systems: As per HPI otherwise 10 point review of systems negative.    Past Medical History:  Diagnosis Date  . Complication of anesthesia    had complications with "gas buildup" after tubal lig  . Dyslipidemia   . History of radiation therapy 07/16/19, 07/19/19, 07/22/19   SRS radiation to 2 cerebellum targets.   . Lung cancer ([place]) dx'd 07/2010   sm cell   . Peripheral neuropathy   . SVC syndrome   . Thrombus 24/07/7352   Left basilic vein,  Left subclavian vein    Past Surgical History:  Procedure  Laterality Date  . CERVICAL BIOPSY    . CHOLECYSTECTOMY N/A 07/25/2016   Procedure: LAPAROSCOPIC CHOLECYSTECTOMY;  Surgeon: Coralie Keens, MD;  Location: [facility];  Service: General;  Laterality: N/A;  . TONSILLECTOMY    . TUBAL LIGATION       reports that she quit smoking about 9 years ago. She has never used smokeless tobacco. She reports that she does not drink alcohol or use  drugs.  Allergies  Allergen Reactions  . Promethazine Hcl Other (See Comments)    Pt reports feeling shaky and "sick" after phenergan    Family History  Problem Relation Age of Onset  . Nephrolithiasis Son   . Heart attack Father   . Heart attack Paternal Uncle   . Heart attack Paternal Grandfather      Prior to Admission medications   Medication Sig Start Date End Date Taking? Authorizing Provider  dexamethasone (DECADRON) 4 MG tablet Take 1 tablet (4 mg total) by mouth daily. 12/18/19  Yes Love, Ivan Anchors, PA-C  pentoxifylline (TRENTAL) 400 MG CR tablet Take 1 tablet (400 mg total) by mouth 3 (three) times daily with meals. 01/20/20  Yes Vaslow, Acey Lav, MD  vitamin E (VITAMIN E) 180 MG (400 UNITS) capsule Take 1 capsule (400 Units total) by mouth daily. 01/20/20  Yes Vaslow, Acey Lav, MD  acetaminophen (TYLENOL) 325 MG tablet Take 1-2 tablets (325-650 mg total) by mouth every 4 (four) hours as needed for mild pain. Patient not taking: Reported on 12/20/2019 12/13/19   Bary Leriche, PA-C  docusate sodium (COLACE) 100 MG capsule Take 1 capsule (100 mg total) by mouth daily. Patient not taking: Reported on 12/20/2019 12/19/19   Love, Ivan Anchors, PA-C  omeprazole (PRILOSEC) 20 MG capsule Take 1 capsule (20 mg total) by mouth daily. Take while on dexamethasone to prevent indigestion. Patient not taking: Reported on 01/31/2020 01/28/20   Eppie Gibson, MD    Physical Exam: Vitals:   01/31/20 1034 01/31/20 1100 01/31/20 1201 01/31/20 1203  BP: 132/75 123/73 127/72 134/79  Pulse: 82 81 81 85  Resp: (!) 21 20 19 16   Temp: 97.9 F (36.6 C)     TempSrc: Oral     SpO2: 96% 95% 96% 98%     Vitals:   01/31/20 1034 01/31/20 1100 01/31/20 1201 01/31/20 1203  BP: 132/75 123/73 127/72 134/79  Pulse: 82 81 81 85  Resp: (!) 21 20 19 16   Temp: 97.9 F (36.6 C)     TempSrc: Oral     SpO2: 96% 95% 96% 98%    Constitutional: NAD, alert and oriented x 2 (person and place), Chronically ill  appearing Eyes: PERRL, lids and conjunctivae normal, pale conjunctiva ENMT: Mucous membranes are moist.  Neck: normal, supple, no masses, no thyromegaly Respiratory: air entry in both lung fields, no wheezing, no crackles. Normal respiratory effort. No accessory muscle use.  Cardiovascular: Regular rate and rhythm, no murmurs / rubs / gallops. 2+ edema. 2+ pedal pulses. No carotid bruits.  Abdomen: no tenderness, no masses palpated. No hepatosplenomegaly. Bowel sounds positive. Central adiposity Musculoskeletal: no clubbing / cyanosis. No joint deformity upper and lower extremities.  Skin: no rashes, lesions, ulcers.  Neurologic: No gross focal neurologic deficit. Psychiatric: Normal mood and affect.   Labs on Admission: I have personally reviewed following labs and imaging studies  CBC: Recent Labs  Lab 01/31/20 1039  WBC 11.1*  NEUTROABS 9.8*  HGB 13.4  HCT 39.3  MCV 91.6  PLT  242   Basic Metabolic Panel: Recent Labs  Lab 01/31/20 1039  NA 137  K 4.1  CL 105  CO2 24  GLUCOSE 105*  BUN 20  CREATININE 0.84  CALCIUM 8.7*   GFR: Estimated Creatinine Clearance: 65.6 mL/min (by C-G formula based on SCr of 0.84 mg/dL). Liver Function Tests: No results for input(s): AST, ALT, ALKPHOS, BILITOT, PROT, ALBUMIN in the last 168 hours. No results for input(s): LIPASE, AMYLASE in the last 168 hours. No results for input(s): AMMONIA in the last 168 hours. Coagulation Profile: Recent Labs  Lab 01/31/20 1055  INR 1.0   Cardiac Enzymes: No results for input(s): CKTOTAL, CKMB, CKMBINDEX, TROPONINI in the last 168 hours. BNP (last 3 results) No results for input(s): PROBNP in the last 8760 hours. HbA1C: No results for input(s): HGBA1C in the last 72 hours. CBG: Recent Labs  Lab 01/31/20 1048  GLUCAP 101*   Lipid Profile: No results for input(s): CHOL, HDL, LDLCALC, TRIG, CHOLHDL, LDLDIRECT in the last 72 hours. Thyroid Function Tests: No results for input(s): TSH,  T4TOTAL, FREET4, T3FREE, THYROIDAB in the last 72 hours. Anemia Panel: No results for input(s): VITAMINB12, FOLATE, FERRITIN, TIBC, IRON, RETICCTPCT in the last 72 hours. Urine analysis:    Component Value Date/Time   COLORURINE YELLOW 12/05/2019 0350   APPEARANCEUR CLEAR 12/05/2019 0350   LABSPEC 1.021 12/05/2019 0350   PHURINE 6.0 12/05/2019 0350   GLUCOSEU NEGATIVE 12/05/2019 0350   HGBUR NEGATIVE 12/05/2019 0350   BILIRUBINUR NEGATIVE 12/05/2019 0350   KETONESUR NEGATIVE 12/05/2019 0350   PROTEINUR NEGATIVE 12/05/2019 0350   NITRITE NEGATIVE 12/05/2019 0350   LEUKOCYTESUR NEGATIVE 12/05/2019 0350    Radiological Exams on Admission: CT Head W or Wo Contrast  Result Date: 01/31/2020 CLINICAL DATA:  Syncope, altered mental status, concern for worsening metastatic disease to brain; syncope, recurrent, small cell lung cancer, assess treatment response. EXAM: CT HEAD WITHOUT AND WITH CONTRAST TECHNIQUE: Contiguous axial images were obtained from the base of the skull through the vertex without and with intravenous contrast CONTRAST:  151mL OMNIPAQUE IOHEXOL 350 MG/ML SOLN COMPARISON:  Brain MRI 01/17/2020, brain MRI 11/28/2019, head CT 11/27/2019 FINDINGS: Brain: Again demonstrated is extensive edema within the bilateral cerebellar hemispheres at sites of known metastatic lesions. There is a 7 mm focus of parenchymal hemorrhage within the superomedial left cerebellum which is in close proximity to a known metastatic lesion within the midline cerebellum. There is limited evaluation for new intracranial metastases on this noncontrast head CT. Similar posterior fossa mass effect with partial effacement of the fourth ventricle. The lateral and third ventricles are unchanged in size and configuration. A small nodular enhancing lesion along the margin of the atrium of the right lateral ventricle is better appreciated on prior MRI 01/17/2020. No supratentorial cortical infarct is identified. There is no  midline shift or extra-axial fluid collection. Stable generalized parenchymal atrophy. Vascular: No hyperdense vessel. Skull: Normal. Negative for fracture or focal lesion. Sinuses/Orbits: Visualized orbits demonstrate no acute abnormality. No significant paranasal sinus disease or mastoid effusion at the imaged levels. These results were called by telephone at the time of interpretation on 01/31/2020 at 12:12 pm to provider [facility] , who verbally acknowledged these results. IMPRESSION: Redemonstrated extensive edema within the bilateral cerebellar hemispheres at site of multiple known cerebellar metastases. There is a new 7 mm focus of parenchymal hemorrhage within the superomedial left cerebellum which is in close proximity to a known metastatic lesion. Unchanged posterior fossa mass effect. An additional small  metastatic lesion along the margin of the atrium of the right lateral ventricle was better appreciated on MRI 01/17/2020. Please note there is limited assessment for new intracranial metastases on this noncontrast head CT. Electronically Signed   By: Kellie Simmering DO   On: 01/31/2020 12:12   CT Angio Chest PE W and/or Wo Contrast  Result Date: 01/31/2020 CLINICAL DATA:  Shortness of breath. History of small cell lung cancer EXAM: CT ANGIOGRAPHY CHEST WITH CONTRAST TECHNIQUE: Multidetector CT imaging of the chest was performed using the standard protocol during bolus administration of intravenous contrast. Multiplanar CT image reconstructions and MIPs were obtained to evaluate the vascular anatomy. CONTRAST:  161mL OMNIPAQUE IOHEXOL 350 MG/ML SOLN COMPARISON:  10/29/2019, 12/15/2015 FINDINGS: Cardiovascular: Pulmonary arteries are adequately opacified. Extensive filling defects within the right and left main branch pulmonary arteries with clot extending into lobar and segmental branches of all lobes. No saddle embolism. RV to LV ratio of 1.1 with slight leftward bowing of the intraventricular  septum. Normal heart size. No pericardial effusion. Thoracic aorta nonaneurysmal. Mediastinum/Nodes: No axillary, mediastinal, or hilar lymphadenopathy. Stable 10 mm left thyroid lobe nodule. No followup recommended (ref: J Am Coll Radiol. 2015 Feb;12(2): 143-50). Trachea and esophagus within normal limits. Lungs/Pleura: Band like area of consolidation and scarring in the left perihilar region appears stable compared to prior, compatible with post treatment changes. Otherwise, no focal airspace consolidation, pleural effusion, or pneumothorax. Upper Abdomen: No acute abnormality. Musculoskeletal: No chest wall abnormality. No acute or significant osseous findings. Review of the MIP images confirms the above findings. IMPRESSION: 1. Extensive bilateral pulmonary emboli with involvement of the right and left main branch pulmonary arteries with clot extending into lobar and segmental branches of all lobes. No saddle embolism. RV to LV ratio of 1.1 with slight bowing of the intraventricular septum suggesting right heart strain. 2. Stable post treatment changes in the left perihilar region. These results were called by telephone at the time of interpretation on 01/31/2020 at 12:08 pm to provider [facility] , who verbally acknowledged these results. Electronically Signed   By: Davina Poke D.O.   On: 01/31/2020 12:09    EKG: Independently reviewed.  Sinus rhythm  Assessment/Plan Principal Problem:   Pulmonary embolism (HCC) Active Problems:   Small cell lung cancer (HCC)   Metastatic lung cancer (metastasis from lung to other site) [facility])   Intracranial hemorrhage, nontraumatic (HCC)   Syncopal episodes    Acute pulmonary embolism Most likely related to history of metastatic small cell lung cancer Patient presented for evaluation of syncopal episodes and had a CT angiogram that showed extensive bilateral pulmonary emboli with involvement of the right and left main branch pulmonary arteries with  clot extending into lobar and segmental branches of all lobes. No saddle embolism. RV to LV ratio of 1.1 with slight bowing of the intraventricular septum suggesting right heart strain. Patient is not a candidate for long term anticoagulation because of an area of hemorrhage within the superomedial left cerebellum which is in close proximity to a known metastatic lesion. Unchanged posterior fossa mass effect. 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 Follow up with oncology as an outpatient   Syncopal Episode  Most likely related to acute PE's Place patient on cardiac monitor  to monitor for arrhythmia Will place patient on fall precautions  DVT prophylaxis: SCD Code Status: Full Family Communication: Plan of care was discussed with patient and her son at the bedside. Code status was discussed and patient wishes to be a Full code Disposition Plan: Back to previous home environment Consults called: Palliative care    Panzy Bubeck MD [facility]     01/31/2020, 1:19 PM

## 2020-01-31 NOTE — ED Provider Notes (Signed)
[facility] DEPT Provider Note   CSN: [id number] Arrival date & time: 01/31/20  1020     History Chief Complaint  Patient presents with  . Loss of Consciousness    Hailey Hill is a 69 y.o. female.  Presents to ER with syncopal episodes.  Over the last week she has had total of 3 episodes of passing out.  Occurred on Sunday, yesterday, this morning.  This morning syncope episode witnessed at cancer center.  Therefore appointment for radiation.  Occurred in sitting position, patient does not recall events.  No report of any seizure activity, no bladder or bowel incontinence.  Currently, patient denies any complaints.  Specifically she denies any chest pain, abdominal pain, vomiting, numbness, weakness, vision changes.  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  Not currently on chemotherapy.  Supposed to start radiation today.  Admission in January for subarachnoid hemorrhage, worsening brain metastases.    Son reports significant decline in overall functional status since January.  HPI     Past Medical History:  Diagnosis Date  . Complication of anesthesia    had complications with "gas buildup" after tubal lig  . Dyslipidemia   . History of radiation therapy 07/16/19, 07/19/19, 07/22/19   SRS radiation to 2 cerebellum targets.   . Lung cancer ([place]) dx'd 07/2010   sm cell   . Peripheral neuropathy   . SVC syndrome   . Thrombus 22/12/5425   Left basilic vein,  Left subclavian vein    Patient Active Problem List   Diagnosis Date Noted  . Syncopal episodes 01/31/2020  . Pulmonary embolism ([place]) 01/31/2020  . Acute pulmonary embolism ([place]) 01/31/2020  . Palliative care by specialist   . DNR (do not resuscitate) discussion   . Lung cancer metastatic to brain ([place]) 12/04/2019  . Closed fracture of temporal bone ([place])   . Intracranial hemorrhage, nontraumatic ([place]) 11/27/2019  . Acute  lower UTI 11/27/2019  . Fall 07/10/2019  . Closed fracture of head of left humerus   . Metastatic lung cancer (metastasis from lung to other site) ([place]) 07/07/2019  . Fracture, humerus, anatomical neck, left, closed, initial encounter 07/07/2019  . Metastatic cancer to brain ([place]) 07/07/2019  . Chest pain with moderate risk for cardiac etiology 12/14/2015  . Small cell lung cancer ([place]) 09/16/2013    Past Surgical History:  Procedure Laterality Date  . CERVICAL BIOPSY    . CHOLECYSTECTOMY N/A 07/25/2016   Procedure: LAPAROSCOPIC CHOLECYSTECTOMY;  Surgeon: Coralie Keens, MD;  Location: [facility];  Service: General;  Laterality: N/A;  . TONSILLECTOMY    . TUBAL LIGATION       OB History   No obstetric history on file.     Family History  Problem Relation Age of Onset  . Nephrolithiasis Son   . Heart attack Father   . Heart attack Paternal Uncle   . Heart attack Paternal Grandfather     Social History   Tobacco Use  . Smoking status: Former Smoker    Quit date: 03/08/2010    Years since quitting: 9.9  . Smokeless tobacco: Never Used  Substance Use Topics  . Alcohol use: No  . Drug use: No    Home Medications Prior to Admission medications   Medication Sig Start Date End Date Taking? Authorizing Provider  dexamethasone (DECADRON) 4 MG tablet Take 1 tablet (4 mg total) by mouth daily. 12/18/19  Yes Love, Ivan Anchors,  PA-C  pentoxifylline (TRENTAL) 400 MG CR tablet Take 1 tablet (400 mg total) by mouth 3 (three) times daily with meals. 01/20/20  Yes Vaslow, Acey Lav, MD  vitamin E (VITAMIN E) 180 MG (400 UNITS) capsule Take 1 capsule (400 Units total) by mouth daily. 01/20/20  Yes Vaslow, Acey Lav, MD  acetaminophen (TYLENOL) 325 MG tablet Take 1-2 tablets (325-650 mg total) by mouth every 4 (four) hours as needed for mild pain. Patient not taking: Reported on 12/20/2019 12/13/19   Bary Leriche, PA-C  docusate sodium (COLACE) 100 MG capsule Take 1 capsule (100  mg total) by mouth daily. Patient not taking: Reported on 12/20/2019 12/19/19   Love, Ivan Anchors, PA-C  omeprazole (PRILOSEC) 20 MG capsule Take 1 capsule (20 mg total) by mouth daily. Take while on dexamethasone to prevent indigestion. Patient not taking: Reported on 01/31/2020 01/28/20   Eppie Gibson, MD    Allergies    Promethazine hcl  Review of Systems   Review of Systems  Constitutional: Negative for chills and fever.  HENT: Negative for ear pain and sore throat.   Eyes: Negative for pain and visual disturbance.  Respiratory: Negative for cough and shortness of breath.   Cardiovascular: Negative for chest pain and palpitations.  Gastrointestinal: Negative for abdominal pain and vomiting.  Genitourinary: Negative for dysuria and hematuria.  Musculoskeletal: Negative for arthralgias and back pain.  Skin: Negative for color change and rash.  Neurological: Positive for syncope. Negative for seizures.  All other systems reviewed and are negative.   Physical Exam Updated Vital Signs BP 133/80   Pulse 85   Temp 97.9 F (36.6 C) (Oral)   Resp 19   SpO2 96%   Physical Exam Vitals and nursing note reviewed.  Constitutional:      General: She is not in acute distress.    Appearance: She is well-developed.  HENT:     Head: Normocephalic and atraumatic.  Eyes:     Conjunctiva/sclera: Conjunctivae normal.  Cardiovascular:     Rate and Rhythm: Normal rate and regular rhythm.     Heart sounds: No murmur.  Pulmonary:     Effort: Pulmonary effort is normal. No respiratory distress.     Breath sounds: Normal breath sounds.  Abdominal:     Palpations: Abdomen is soft.     Tenderness: There is no abdominal tenderness.  Musculoskeletal:     Cervical back: Neck supple.  Skin:    General: Skin is warm and dry.  Neurological:     Mental Status: She is alert.     Comments: Alert, oriented to person, place, not time Speech clear, moves all 4 extremities equally     ED Results /  Procedures / Treatments   Labs (all labs ordered are listed, but only abnormal results are displayed) Labs Reviewed  CBC WITH DIFFERENTIAL/PLATELET - Abnormal; Notable for the following components:      Result Value   WBC 11.1 (*)    Neutro Abs 9.8 (*)    Lymphs Abs 0.4 (*)    Abs Immature Granulocytes 0.48 (*)    All other components within normal limits  BASIC METABOLIC PANEL - Abnormal; Notable for the following components:   Glucose, Bld 105 (*)    Calcium 8.7 (*)    All other components within normal limits  APTT - Abnormal; Notable for the following components:   aPTT 21 (*)    All other components within normal limits  CBG MONITORING, ED - Abnormal; Notable  for the following components:   Glucose-Capillary 101 (*)    All other components within normal limits  SARS CORONAVIRUS 2 (TAT 6-24 HRS)  BRAIN NATRIURETIC PEPTIDE  PROTIME-INR  LACTIC ACID, PLASMA  LACTIC ACID, PLASMA  CBC  BASIC METABOLIC PANEL  TROPONIN I (HIGH SENSITIVITY)    EKG EKG Interpretation  Date/Time:  Friday January 31 2020 10:35:08 EDT Ventricular Rate:  83 PR Interval:    QRS Duration: 112 QT Interval:  373 QTC Calculation: 439 R Axis:   42 Text Interpretation: Sinus rhythm Borderline intraventricular conduction delay Confirmed by Madalyn Rob [phone]) on 01/31/2020 11:11:15 AM   Radiology CT Head W or Wo Contrast  Result Date: 01/31/2020 CLINICAL DATA:  Syncope, altered mental status, concern for worsening metastatic disease to brain; syncope, recurrent, small cell lung cancer, assess treatment response. EXAM: CT HEAD WITHOUT AND WITH CONTRAST TECHNIQUE: Contiguous axial images were obtained from the base of the skull through the vertex without and with intravenous contrast CONTRAST:  156mL OMNIPAQUE IOHEXOL 350 MG/ML SOLN COMPARISON:  Brain MRI 01/17/2020, brain MRI 11/28/2019, head CT 11/27/2019 FINDINGS: Brain: Again demonstrated is extensive edema within the bilateral cerebellar  hemispheres at sites of known metastatic lesions. There is a 7 mm focus of parenchymal hemorrhage within the superomedial left cerebellum which is in close proximity to a known metastatic lesion within the midline cerebellum. There is limited evaluation for new intracranial metastases on this noncontrast head CT. Similar posterior fossa mass effect with partial effacement of the fourth ventricle. The lateral and third ventricles are unchanged in size and configuration. A small nodular enhancing lesion along the margin of the atrium of the right lateral ventricle is better appreciated on prior MRI 01/17/2020. No supratentorial cortical infarct is identified. There is no midline shift or extra-axial fluid collection. Stable generalized parenchymal atrophy. Vascular: No hyperdense vessel. Skull: Normal. Negative for fracture or focal lesion. Sinuses/Orbits: Visualized orbits demonstrate no acute abnormality. No significant paranasal sinus disease or mastoid effusion at the imaged levels. These results were called by telephone at the time of interpretation on 01/31/2020 at 12:12 pm to provider [facility] , who verbally acknowledged these results. IMPRESSION: Redemonstrated extensive edema within the bilateral cerebellar hemispheres at site of multiple known cerebellar metastases. There is a new 7 mm focus of parenchymal hemorrhage within the superomedial left cerebellum which is in close proximity to a known metastatic lesion. Unchanged posterior fossa mass effect. An additional small metastatic lesion along the margin of the atrium of the right lateral ventricle was better appreciated on MRI 01/17/2020. Please note there is limited assessment for new intracranial metastases on this noncontrast head CT. Electronically Signed   By: Kellie Simmering DO   On: 01/31/2020 12:12   CT Angio Chest PE W and/or Wo Contrast  Result Date: 01/31/2020 CLINICAL DATA:  Shortness of breath. History of small cell lung cancer EXAM: CT  ANGIOGRAPHY CHEST WITH CONTRAST TECHNIQUE: Multidetector CT imaging of the chest was performed using the standard protocol during bolus administration of intravenous contrast. Multiplanar CT image reconstructions and MIPs were obtained to evaluate the vascular anatomy. CONTRAST:  143mL OMNIPAQUE IOHEXOL 350 MG/ML SOLN COMPARISON:  10/29/2019, 12/15/2015 FINDINGS: Cardiovascular: Pulmonary arteries are adequately opacified. Extensive filling defects within the right and left main branch pulmonary arteries with clot extending into lobar and segmental branches of all lobes. No saddle embolism. RV to LV ratio of 1.1 with slight leftward bowing of the intraventricular septum. Normal heart size. No pericardial effusion. Thoracic aorta nonaneurysmal.  Mediastinum/Nodes: No axillary, mediastinal, or hilar lymphadenopathy. Stable 10 mm left thyroid lobe nodule. No followup recommended (ref: J Am Coll Radiol. 2015 Feb;12(2): 143-50). Trachea and esophagus within normal limits. Lungs/Pleura: Band like area of consolidation and scarring in the left perihilar region appears stable compared to prior, compatible with post treatment changes. Otherwise, no focal airspace consolidation, pleural effusion, or pneumothorax. Upper Abdomen: No acute abnormality. Musculoskeletal: No chest wall abnormality. No acute or significant osseous findings. Review of the MIP images confirms the above findings. IMPRESSION: 1. Extensive bilateral pulmonary emboli with involvement of the right and left main branch pulmonary arteries with clot extending into lobar and segmental branches of all lobes. No saddle embolism. RV to LV ratio of 1.1 with slight bowing of the intraventricular septum suggesting right heart strain. 2. Stable post treatment changes in the left perihilar region. These results were called by telephone at the time of interpretation on 01/31/2020 at 12:08 pm to provider [facility] , who verbally acknowledged these results.  Electronically Signed   By: Davina Poke D.O.   On: 01/31/2020 12:09   VAS [place] LOWER EXTREMITY VENOUS (DVT) ([facility] and [facility] 7a-7p)  Result Date: 01/31/2020  Lower Venous DVTStudy Indications: Pulmonary embolism, and Swelling.  Risk Factors: Cancer. Limitations: Poor ultrasound/tissue interface. Comparison Study: No prior studies. Performing Technologist: Oliver Hum RVT  Examination Guidelines: A complete evaluation includes B-mode imaging, spectral Doppler, color Doppler, and power Doppler as needed of all accessible portions of each vessel. Bilateral testing is considered an integral part of a complete examination. Limited examinations for reoccurring indications may be performed as noted. The reflux portion of the exam is performed with the patient in reverse Trendelenburg.  +---------+---------------+---------+-----------+----------+--------------+ RIGHT    CompressibilityPhasicitySpontaneityPropertiesThrombus Aging +---------+---------------+---------+-----------+----------+--------------+ CFV      Full           Yes      Yes                                 +---------+---------------+---------+-----------+----------+--------------+ SFJ      Full                                                        +---------+---------------+---------+-----------+----------+--------------+ FV Prox  Full                                                        +---------+---------------+---------+-----------+----------+--------------+ FV Mid   Full                                                        +---------+---------------+---------+-----------+----------+--------------+ FV DistalFull                                                        +---------+---------------+---------+-----------+----------+--------------+ PFV  Full                                                        +---------+---------------+---------+-----------+----------+--------------+ POP       Full           Yes      Yes                                 +---------+---------------+---------+-----------+----------+--------------+ PTV      Full                                                        +---------+---------------+---------+-----------+----------+--------------+ PERO     None                                         Acute          +---------+---------------+---------+-----------+----------+--------------+   +---------+---------------+---------+-----------+----------+--------------+ LEFT     CompressibilityPhasicitySpontaneityPropertiesThrombus Aging +---------+---------------+---------+-----------+----------+--------------+ CFV      Full           Yes      Yes                                 +---------+---------------+---------+-----------+----------+--------------+ SFJ      Full                                                        +---------+---------------+---------+-----------+----------+--------------+ FV Prox  Full                                                        +---------+---------------+---------+-----------+----------+--------------+ FV Mid   Full                                                        +---------+---------------+---------+-----------+----------+--------------+ FV DistalFull                                                        +---------+---------------+---------+-----------+----------+--------------+ PFV      Full                                                        +---------+---------------+---------+-----------+----------+--------------+  POP      Full           Yes      Yes                                 +---------+---------------+---------+-----------+----------+--------------+ PTV      Full                                                        +---------+---------------+---------+-----------+----------+--------------+ PERO     Full                                                         +---------+---------------+---------+-----------+----------+--------------+     Summary: RIGHT: - Findings consistent with acute deep vein thrombosis involving the right peroneal veins. - No cystic structure found in the popliteal fossa.  LEFT: - There is no evidence of deep vein thrombosis in the lower extremity.  - No cystic structure found in the popliteal fossa.  *See table(s) above for measurements and observations. Electronically signed by Deitra Mayo MD on 01/31/2020 at 1:29:45 PM.    Final     Procedures .Critical Care Performed by: Lucrezia Starch, MD Authorized by: Lucrezia Starch, MD   Critical care provider statement:    Critical care time (minutes):  72   Critical care was necessary to treat or prevent imminent or life-threatening deterioration of the following conditions:  Respiratory failure and CNS failure or compromise   Critical care was time spent personally by me on the following activities:  Discussions with consultants, evaluation of patient's response to treatment, examination of patient, ordering and performing treatments and interventions, ordering and review of laboratory studies, ordering and review of radiographic studies, pulse oximetry, re-evaluation of patient's condition, obtaining history from patient or surrogate and review of old charts   (including critical care time)  Medications Ordered in ED Medications  sodium chloride (PF) 0.9 % injection (has no administration in time range)  acetaminophen (TYLENOL) tablet 325-650 mg (has no administration in time range)  docusate sodium (COLACE) capsule 100 mg (has no administration in time range)  dexamethasone (DECADRON) tablet 4 mg (has no administration in time range)  pantoprazole (PROTONIX) EC tablet 40 mg (has no administration in time range)  pentoxifylline (TRENTAL) CR tablet 400 mg (has no administration in time range)  vitamin E capsule 400 Units (has no administration in time  range)  ondansetron (ZOFRAN) tablet 4 mg (has no administration in time range)    Or  ondansetron (ZOFRAN) injection 4 mg (has no administration in time range)  iohexol (OMNIPAQUE) 350 MG/ML injection 100 mL (100 mLs Intravenous Contrast Given 01/31/20 1134)    ED Course  I have reviewed the triage vital signs and the nursing notes.  Pertinent labs & imaging results that were available during my care of the patient were reviewed by me and considered in my medical decision making (see chart for details).  Clinical Course as of Jan 31 1752  Fri Jan 31, 2020  1230 Updated patient and son, agreeable to hospital admission, interested in discussing goals of  care with palliative care team   [RD]    Clinical Course User Index [RD] Lucrezia Starch, MD   MDM Rules/Calculators/A&P                      69 year old lady presents to ER with syncopal episode.  Notable history for small cell lung cancer, metastatic disease to brain.  Supposed to start radiation for her brain mets today.  Work-up today concerning for worsening brain mets with small area of hemorrhage as well as extensive bilateral pulmonary emboli. Hemodynamically stable. No focal neuro defecits. On RA, not requiring O2. Discussed case with pulmonology, Dr. Chase Caller -anticoagulation will be contraindicated.  We will not be candidate for other interventions (thrombectomy, catheter directed lytics, etc.).  Would be a candidate for filter.  Consulted hospitalist service for further management.  Long-term goals of care discussion with son and patient.  Patient has had significant global decline over the past few months and seem to be open to considering palliative care.  Discussed this with hospitalist.  Dr. Marthenia Rolling accepting.  Final Clinical Impression(s) / ED Diagnoses Final diagnoses:  Other acute pulmonary embolism, unspecified whether acute cor pulmonale present ([place])  Nontraumatic intracerebral hemorrhage, unspecified cerebral  location, unspecified laterality ([place])  Brain metastases [facility])    Rx / DC Orders ED Discharge Orders    None       Lucrezia Starch, MD 01/31/20 1757

## 2020-01-31 NOTE — Progress Notes (Signed)
Progress and ED Referral Note  Patient was seen and evaluated as an add-on downstairs in [facility] due to son's complaint of recurrent episodes of loss of consciousness.  He describes two events, first on 01/26/20 and second on 01/30/24 of similar semiology.  He describes her "falling like a weight", possibly losing consciousness very briefly.  Patient has full recall of the episodes, there is no prodrome, no amnesia, no post-event confusion or lethargy.  She denies palipitations or chest pain but son acknowledges "fast anxious breathing" accompanying events.    During transition to [place], event recurred and was witnessed by staff.  Transitioning out of wheelchair, patient became "heavy like lead weight" and didn't support her weight, falling with support to the floor.  She may have briefly lost consciousness or "nodded out", but then returned quickly.    Exam during event: On floor being held up by rad-onc staff.  Patient awake, mostly alert with conversation.  Appears attentive to examiner.  Able to move extremities against gravity once placed back in chair.  Vitals obtained by staff reviewed, HR 74, BP 154/88, afebrile.  Recommended evaluation in ED given possibility of recurrent cardiogenic syncope.  Will not recommend SRS today. Do not suspect epileptic cause for altered awareness/LOC given location of metastases and lack of clear semiology.  Staff will transport to ED.  Time spent in evaluation 30 minutes  Ventura Sellers, MD

## 2020-01-31 NOTE — ED Triage Notes (Signed)
Pt brought over from [facility] for syncopal episode today that happened before she could get her treatment. Family reports that she had one last Sunday and yesterday as well. Unsure of what is causing them.

## 2020-01-31 NOTE — Discharge Planning (Signed)
RNCM consulted regarding home hospice vs home with home health.  RNCM consulted [facility] Rep, Bradd Canary for assistance.  RNCM requested palliative care consult to discuss goals of care.

## 2020-01-31 NOTE — Consult Note (Signed)
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  Status post fall with humerus fracture in August 2020, history of cerebellar mets, left subclavian vein thrombosis peripheral neuropathy.  Presented for a syncopal episode from the oncologist office.  There is a history of 2 other syncopal episodes in the last 1 week.  There might been some transient loss of consciousness.  CT scan of the head showed extensive edema within the bilateral cerebellar hemispheres at site of multiple known cerebellar metastasis.  There is also a new 7 mm focus of parenchymal hemorrhage in the left cerebellum close to a metastatic lesion.  She had a CT angiogram of the chest that showed bilateral pulmonary embolism but no saddle PE.  RAP to LV ratio is 1.1 suggesting RV strain.  However patient's heart rate, pulse ox, mental status and other markers of submassive pulmonary embolism such as BNP, troponin and lactic acid are normal.  Lower extremity Doppler shows acute DVT in the right side.  Patient is frail but denies any chest pain shortness of breath nausea vomiting cough  Pulmonary has been consulted for advising on appropriate pulmonary embolism treatment in the setting of malignancy associated with brain metastasis and parenchymal hemorrhage  Results for Hailey, Hill (MRN [id number]) as of 01/31/2020 17:38  Ref. Range 01/31/2020 10:55  B Natriuretic Peptide Latest Ref Range: 0.0 - 100.0 pg/mL 46.3  Troponin I (High Sensitivity) Latest Ref Range: <18 ng/L 3  Results for Hailey, Hill (MRN [id number]) as of 01/31/2020 17:38  Ref. Range 01/31/2020 15:10  Lactic Acid, Venous Latest Ref Range: 0.5 - 1.9 mmol/L 1.8    Past Medical History     has a past medical history of Complication of anesthesia, Dyslipidemia, History of radiation therapy (07/16/19, 07/19/19, 07/22/19), Lung cancer ([place]) (dx'd 07/2010), Peripheral neuropathy, SVC syndrome, and Thrombus (10/21/2010).   reports that she quit smoking about 9 years ago. She has never used smokeless tobacco.  Past Surgical History:  Procedure Laterality Date  . CERVICAL BIOPSY    . CHOLECYSTECTOMY N/A 07/25/2016   Procedure: LAPAROSCOPIC CHOLECYSTECTOMY;  Surgeon: Coralie Keens, MD;  Location: [facility];  Service: General;  Laterality: N/A;  . TONSILLECTOMY    . TUBAL LIGATION      Allergies  Allergen Reactions  . Promethazine Hcl Other (See Comments)    Pt reports feeling shaky and "sick" after phenergan     There is no immunization history on file for this patient.  Family History  Problem Relation Age of Onset  . Nephrolithiasis Son   . Heart attack Father   . Heart attack Paternal Uncle   . Heart attack Paternal Grandfather      Current Facility-Administered Medications:  .  acetaminophen (TYLENOL) tablet 325-650 mg, 325-650 mg, Oral, Q4H PRN, Agbata, Tochukwu, MD .  dexamethasone (DECADRON) tablet 4 mg, 4 mg, Oral, Daily, Agbata, Tochukwu, MD .  docusate sodium (COLACE) capsule 100 mg, 100 mg, Oral, Daily, Agbata, Tochukwu, MD .  ondansetron (ZOFRAN) tablet 4 mg, 4 mg, Oral, Q6H PRN **OR** ondansetron (ZOFRAN) injection 4 mg, 4 mg, Intravenous, Q6H PRN, Agbata, Tochukwu, MD .  pantoprazole (  PROTONIX) EC tablet 40 mg, 40 mg, Oral, Daily, Agbata, Tochukwu, MD .  pentoxifylline (TRENTAL) CR tablet 400 mg, 400 mg, Oral, TID WC, Agbata, Tochukwu, MD .  sodium chloride (PF) 0.9 % injection, , , ,  .  vitamin E capsule 400 Units, 400 Units, Oral, Daily, Agbata, Tochukwu, MD  Current Outpatient Medications:  .  dexamethasone (DECADRON) 4 MG tablet, Take 1 tablet (4 mg total) by mouth daily., Disp: 30 tablet, Rfl: 0 .   pentoxifylline (TRENTAL) 400 MG CR tablet, Take 1 tablet (400 mg total) by mouth 3 (three) times daily with meals., Disp: 90 tablet, Rfl: 3 .  vitamin E (VITAMIN E) 180 MG (400 UNITS) capsule, Take 1 capsule (400 Units total) by mouth daily., Disp: 30 capsule, Rfl: 3 .  acetaminophen (TYLENOL) 325 MG tablet, Take 1-2 tablets (325-650 mg total) by mouth every 4 (four) hours as needed for mild pain. (Patient not taking: Reported on 12/20/2019), Disp:  , Rfl:  .  docusate sodium (COLACE) 100 MG capsule, Take 1 capsule (100 mg total) by mouth daily. (Patient not taking: Reported on 12/20/2019), Disp: 30 capsule, Rfl: 0 .  omeprazole (PRILOSEC) 20 MG capsule, Take 1 capsule (20 mg total) by mouth daily. Take while on dexamethasone to prevent indigestion. (Patient not taking: Reported on 01/31/2020), Disp: 30 capsule, Rfl: [facility]   01/31/2020  - admit  Consults:  01/31/2020 -ccm consult  Procedures:  x  Significant Diagnostic Tests:  x  Micro Data:  x  Antimicrobials:  x   Interim history/subjective:  01/31/2020 - seen in [facility] ER bed 24  Objective   Blood pressure 116/71, pulse 85, temperature 97.9 F (36.6 C), temperature source Oral, resp. rate 18, SpO2 96 %.       No intake or output data in the 24 hours ending 01/31/20 1738 There were no vitals filed for this visit.  Examination: General: Frail female lying in the stretcher HENT: No neck nodes no elevated JVP Lungs: Clear to auscultation bilaterally Cardiovascular: Normal blood pressure normal heart sounds Abdomen: Soft nontender no organomegaly Extremities: No cyanosis no clubbing no edema Neuro: Alert and oriented x3.  Speech normal.  Nonfocal GU: x  Resolved Hospital Problem list   x  Assessment & Plan:  Pulmonary embolism -RV LV ratio greater than 1.1 but no clinical evidence of submassive PE.  Nevertheless high risk category because of advanced malignancy.  Very poor 30-day mortality because  of PE in the setting of advanced malignancy of the lung  High risk for side effects of anticoagulation such as bleeding given the fact she has intracranial metastasis and intracranial hemorrhage  Plan  -Recommend avoiding all anticoagulation including IV heparin -Definitely no thrombolytics in any form -Recommend IVC filter (I explained to this patient and Dr Francine Graven) -At some point if the brain bleed settles andn the metastasis is controlled (if these outcomes do occur] -then check with neurology for safety of starting anticoagulation -If she goes on anticoagulation the duration of anticoagulation is indefinite as long as the malignancy is active  Overall prognosis itself appears very poor and palliative care for goals of care would be appropriate  CCM will sign off  Best practice:  Per the hospitalist     SIGNATURE    Dr. Brand Males, M.D., F.C.C.P,  Pulmonary and Critical Care Medicine Staff Physician, [place] Director - Interstitial Lung Disease  Program  P[facility]  [place], [place], [address]  Pager: [phone], If no answer or between  15:00h - 7:00h: call [phone] Telephone: [phone]  5:54 PM 01/31/2020

## 2020-01-31 NOTE — ED Notes (Signed)
ED Provider at bedside. 

## 2020-01-31 NOTE — Progress Notes (Signed)
Geneticist, molecular received referral for pt dc home with hospice services.   Upon reviewing pt's chart, it is noted that pt is still receiving radiation and would not be eligible for hospice if radiation continues.   Liaison recommended a PMT consult to discuss [place]. Once GOCs are determined, [facility] will move forward with either hospice or palliative referral.   Thank you for the referral. We will continue to follow in order to offer what is best for pt's care.   Freddie Breech, RN [facility] Liaison [phone]

## 2020-02-01 ENCOUNTER — Inpatient Hospital Stay (HOSPITAL_COMMUNITY): Payer: Medicare HMO

## 2020-02-01 DIAGNOSIS — R55 Syncope and collapse: Secondary | ICD-10-CM

## 2020-02-01 DIAGNOSIS — Z515 Encounter for palliative care: Secondary | ICD-10-CM

## 2020-02-01 DIAGNOSIS — Z7189 Other specified counseling: Secondary | ICD-10-CM

## 2020-02-01 DIAGNOSIS — C7931 Secondary malignant neoplasm of brain: Secondary | ICD-10-CM

## 2020-02-01 LAB — CBC WITH DIFFERENTIAL/PLATELET
Abs Immature Granulocytes: 0.28 10*3/uL — ABNORMAL HIGH (ref 0.00–0.07)
Basophils Absolute: 0 10*3/uL (ref 0.0–0.1)
Basophils Relative: 1 %
Eosinophils Absolute: 0 10*3/uL (ref 0.0–0.5)
Eosinophils Relative: 1 %
HCT: 39.5 % (ref 36.0–46.0)
Hemoglobin: 13.7 g/dL (ref 12.0–15.0)
Immature Granulocytes: 3 %
Lymphocytes Relative: 16 %
Lymphs Abs: 1.3 10*3/uL (ref 0.7–4.0)
MCH: 31.3 pg (ref 26.0–34.0)
MCHC: 34.7 g/dL (ref 30.0–36.0)
MCV: 90.2 fL (ref 80.0–100.0)
Monocytes Absolute: 0.5 10*3/uL (ref 0.1–1.0)
Monocytes Relative: 6 %
Neutro Abs: 6.3 10*3/uL (ref 1.7–7.7)
Neutrophils Relative %: 73 %
Platelets: 178 10*3/uL (ref 150–400)
RBC: 4.38 MIL/uL (ref 3.87–5.11)
RDW: 14.8 % (ref 11.5–15.5)
WBC: 8.5 10*3/uL (ref 4.0–10.5)
nRBC: 0 % (ref 0.0–0.2)

## 2020-02-01 LAB — COMPREHENSIVE METABOLIC PANEL
ALT: 18 U/L (ref 0–44)
AST: 11 U/L — ABNORMAL LOW (ref 15–41)
Albumin: 3.5 g/dL (ref 3.5–5.0)
Alkaline Phosphatase: 69 U/L (ref 38–126)
Anion gap: 10 (ref 5–15)
BUN: 15 mg/dL (ref 8–23)
CO2: 24 mmol/L (ref 22–32)
Calcium: 8.9 mg/dL (ref 8.9–10.3)
Chloride: 103 mmol/L (ref 98–111)
Creatinine, Ser: 0.64 mg/dL (ref 0.44–1.00)
GFR calc Af Amer: >60 mL/min (ref >60)
GFR calc non Af Amer: >60 mL/min (ref >60)
Glucose, Bld: 93 mg/dL (ref 70–99)
Potassium: 3.6 mmol/L (ref 3.5–5.1)
Sodium: 137 mmol/L (ref 135–145)
Total Bilirubin: 1.1 mg/dL (ref 0.3–1.2)
Total Protein: 5.8 g/dL — ABNORMAL LOW (ref 6.5–8.1)

## 2020-02-01 LAB — MAGNESIUM: Magnesium: 2.3 mg/dL (ref 1.7–2.4)

## 2020-02-01 MED ORDER — MORPHINE SULFATE (CONCENTRATE) 10 MG/0.5ML PO SOLN: 10.0000 mg | ORAL | Status: DC | PRN

## 2020-02-01 MED ORDER — LORAZEPAM 2 MG/ML PO CONC: 0.5000 mg | ORAL | Status: DC | PRN

## 2020-02-01 MED ORDER — HALOPERIDOL LACTATE 2 MG/ML PO CONC
1.0000 mg | Freq: Four times a day (QID) | ORAL | Status: DC | PRN
  Filled 2020-02-01: qty 0.5

## 2020-02-01 NOTE — Progress Notes (Signed)
Pt pulling at all lines and removed IV x 2. Pt refused new IV placement as well as morning labs and vs despite numerous attempts. B Kyere notified.

## 2020-02-01 NOTE — Consult Note (Addendum)
Chief Complaint: Patient was seen in consultation today for retrievable inferior vena cava filter placement Chief Complaint  Patient presents with  . Loss of Consciousness   at the request of Dr Chase Caller   Supervising Physician: Aletta Edouard  Patient Status: [facility] - In-pt  History of Present Illness: Hailey Hill is a 69 y.o. 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 Extensive bilateral pulmonary emboli with involvement of the right and left main branch pulmonary arteries with clot extending into lobar and segmental branches of all lobes. No saddle embolism. RV to LV ratio of 1.1 with slight bowing of the intraventricular septum suggesting right heart strain. 2. Stable post treatment changes in the left perihilar region.  RLE doppler: yesterday: Findings consistent with acute deep vein thrombosis involving the right peroneal veins. - No cystic structure found in the popliteal fossa.  Pt not a candidate for anticoagulation treatment Request for IVC filter  Reviewed with Dr Kathlene Cote He approves procedure    Past Medical History:  Diagnosis Date  . Complication of anesthesia    had complications with "gas buildup" after tubal lig  . Dyslipidemia   . History of radiation therapy 07/16/19, 07/19/19, 07/22/19   SRS radiation to 2 cerebellum targets.   . Lung cancer ([place]) dx'd 07/2010   sm cell   . Peripheral neuropathy   . SVC syndrome   . Thrombus 09/60/4540   Left basilic vein,  Left subclavian vein    Past Surgical History:  Procedure Laterality Date  . CERVICAL BIOPSY    . CHOLECYSTECTOMY N/A 07/25/2016   Procedure: LAPAROSCOPIC CHOLECYSTECTOMY;  Surgeon: Coralie Keens, MD;  Location: [place];  Service: General;  Laterality: N/A;  . TONSILLECTOMY    . TUBAL LIGATION      Allergies: Promethazine  hcl  Medications: Prior to Admission medications   Medication Sig Start Date End Date Taking? Authorizing Provider  dexamethasone (DECADRON) 4 MG tablet Take 1 tablet (4 mg total) by mouth daily. 12/18/19  Yes Love, Ivan Anchors, PA-C  pentoxifylline (TRENTAL) 400 MG CR tablet Take 1 tablet (400 mg total) by mouth 3 (three) times daily with meals. 01/20/20  Yes Vaslow, Acey Lav, MD  vitamin E (VITAMIN E) 180 MG (400 UNITS) capsule Take 1 capsule (400 Units total) by mouth daily. 01/20/20  Yes Vaslow, Acey Lav, MD  acetaminophen (TYLENOL) 325 MG tablet Take 1-2 tablets (325-650 mg total) by mouth every 4 (four) hours as needed for mild pain. Patient not taking: Reported on 12/20/2019 12/13/19   Bary Leriche, PA-C  docusate sodium (COLACE) 100 MG capsule Take 1 capsule (100 mg total) by mouth daily. Patient not taking: Reported on 12/20/2019 12/19/19   Love, Ivan Anchors, PA-C  omeprazole (PRILOSEC) 20 MG capsule Take 1 capsule (20 mg total) by mouth daily. Take while on dexamethasone to prevent indigestion. Patient not taking: Reported on 01/31/2020 01/28/20   Eppie Gibson, MD     Family History  Problem Relation Age of Onset  . Nephrolithiasis Son   . Heart attack Father   . Heart attack Paternal Uncle   . Heart attack Paternal Grandfather     Social History   Socioeconomic History  . Marital status: Married    Spouse name: Not on file  . Number of children: Not on file  . Years of education: Not on file  .  Highest education level: Not on file  Occupational History  . Not on file  Tobacco Use  . Smoking status: Former Smoker    Quit date: 03/08/2010    Years since quitting: 9.9  . Smokeless tobacco: Never Used  Substance and Sexual Activity  . Alcohol use: No  . Drug use: No  . Sexual activity: Never    Birth control/protection: Surgical  Other Topics Concern  . Not on file  Social History Narrative  . Not on file   Social Determinants of Health   Financial Resource Strain:   .  Difficulty of Paying Living Expenses:   Food Insecurity:   . Worried About Charity fundraiser in the Last Year:   . Arboriculturist in the Last Year:   Transportation Needs: No Transportation Needs  . Lack of Transportation (Medical): No  . Lack of Transportation (Non-Medical): No  Physical Activity:   . Days of Exercise per Week:   . Minutes of Exercise per Session:   Stress:   . Feeling of Stress :   Social Connections:   . Frequency of Communication with Friends and Family:   . Frequency of Social Gatherings with Friends and Family:   . Attends Religious Services:   . Active Member of Clubs or Organizations:   . Attends Archivist Meetings:   Marland Kitchen Marital Status:     Review of Systems: A 12 point ROS discussed and pertinent positives are indicated in the HPI above.  All other systems are negative.  Review of Systems  Constitutional: Negative for fatigue.  Respiratory: Positive for shortness of breath.   Neurological: Positive for weakness.  Psychiatric/Behavioral: Positive for decreased concentration.    Vital Signs: BP 120/71 (BP Location: Left Arm)   Pulse 75   Temp 98.2 F (36.8 C) (Oral)   Resp 18   Ht 5\' 8"  (1.727 m)   Wt 150 lb 5.7 oz (68.2 kg)   SpO2 100%   BMI 22.86 kg/m   Physical Exam Vitals reviewed.  Cardiovascular:     Rate and Rhythm: Normal rate and regular rhythm.     Heart sounds: Normal heart sounds.  Pulmonary:     Breath sounds: Normal breath sounds.  Abdominal:     Palpations: Abdomen is soft.  Musculoskeletal:        General: Normal range of motion.  Skin:    General: Skin is warm and dry.  Neurological:     Mental Status: She is alert. Mental status is at baseline.  Psychiatric:        Behavior: Behavior normal.     Comments: Consented with Husband Lane via phone     Imaging: CT Head W or Wo Contrast  Result Date: 01/31/2020 CLINICAL DATA:  Syncope, altered mental status, concern for worsening metastatic disease to  brain; syncope, recurrent, small cell lung cancer, assess treatment response. EXAM: CT HEAD WITHOUT AND WITH CONTRAST TECHNIQUE: Contiguous axial images were obtained from the base of the skull through the vertex without and with intravenous contrast CONTRAST:  123mL OMNIPAQUE IOHEXOL 350 MG/ML SOLN COMPARISON:  Brain MRI 01/17/2020, brain MRI 11/28/2019, head CT 11/27/2019 FINDINGS: Brain: Again demonstrated is extensive edema within the bilateral cerebellar hemispheres at sites of known metastatic lesions. There is a 7 mm focus of parenchymal hemorrhage within the superomedial left cerebellum which is in close proximity to a known metastatic lesion within the midline cerebellum. There is limited evaluation for new intracranial metastases on this noncontrast  head CT. Similar posterior fossa mass effect with partial effacement of the fourth ventricle. The lateral and third ventricles are unchanged in size and configuration. A small nodular enhancing lesion along the margin of the atrium of the right lateral ventricle is better appreciated on prior MRI 01/17/2020. No supratentorial cortical infarct is identified. There is no midline shift or extra-axial fluid collection. Stable generalized parenchymal atrophy. Vascular: No hyperdense vessel. Skull: Normal. Negative for fracture or focal lesion. Sinuses/Orbits: Visualized orbits demonstrate no acute abnormality. No significant paranasal sinus disease or mastoid effusion at the imaged levels. These results were called by telephone at the time of interpretation on 01/31/2020 at 12:12 pm to provider [facility] , who verbally acknowledged these results. IMPRESSION: Redemonstrated extensive edema within the bilateral cerebellar hemispheres at site of multiple known cerebellar metastases. There is a new 7 mm focus of parenchymal hemorrhage within the superomedial left cerebellum which is in close proximity to a known metastatic lesion. Unchanged posterior fossa mass  effect. An additional small metastatic lesion along the margin of the atrium of the right lateral ventricle was better appreciated on MRI 01/17/2020. Please note there is limited assessment for new intracranial metastases on this noncontrast head CT. Electronically Signed   By: Kellie Simmering DO   On: 01/31/2020 12:12   CT Angio Chest PE W and/or Wo Contrast  Result Date: 01/31/2020 CLINICAL DATA:  Shortness of breath. History of small cell lung cancer EXAM: CT ANGIOGRAPHY CHEST WITH CONTRAST TECHNIQUE: Multidetector CT imaging of the chest was performed using the standard protocol during bolus administration of intravenous contrast. Multiplanar CT image reconstructions and MIPs were obtained to evaluate the vascular anatomy. CONTRAST:  163mL OMNIPAQUE IOHEXOL 350 MG/ML SOLN COMPARISON:  10/29/2019, 12/15/2015 FINDINGS: Cardiovascular: Pulmonary arteries are adequately opacified. Extensive filling defects within the right and left main branch pulmonary arteries with clot extending into lobar and segmental branches of all lobes. No saddle embolism. RV to LV ratio of 1.1 with slight leftward bowing of the intraventricular septum. Normal heart size. No pericardial effusion. Thoracic aorta nonaneurysmal. Mediastinum/Nodes: No axillary, mediastinal, or hilar lymphadenopathy. Stable 10 mm left thyroid lobe nodule. No followup recommended (ref: J Am Coll Radiol. 2015 Feb;12(2): 143-50). Trachea and esophagus within normal limits. Lungs/Pleura: Band like area of consolidation and scarring in the left perihilar region appears stable compared to prior, compatible with post treatment changes. Otherwise, no focal airspace consolidation, pleural effusion, or pneumothorax. Upper Abdomen: No acute abnormality. Musculoskeletal: No chest wall abnormality. No acute or significant osseous findings. Review of the MIP images confirms the above findings. IMPRESSION: 1. Extensive bilateral pulmonary emboli with involvement of the right  and left main branch pulmonary arteries with clot extending into lobar and segmental branches of all lobes. No saddle embolism. RV to LV ratio of 1.1 with slight bowing of the intraventricular septum suggesting right heart strain. 2. Stable post treatment changes in the left perihilar region. These results were called by telephone at the time of interpretation on 01/31/2020 at 12:08 pm to provider [facility] , who verbally acknowledged these results. Electronically Signed   By: Davina Poke D.O.   On: 01/31/2020 12:09   MR Brain W Wo Contrast  Addendum Date: 01/20/2020   ADDENDUM REPORT: 01/20/2020 07:42 ADDENDUM: Interval subcentimeter nodular area of enhancement along the ependymal surface of the atrium of the right lateral ventricle, which was discussed in tumor board this morning. Electronically Signed   By: Monte Fantasia M.D.   On: 01/20/2020 07:42  Addendum Date: 01/17/2020   ADDENDUM REPORT: 01/17/2020 14:13 ADDENDUM: Interval resolution of left parietal scalp soft tissue swelling due to contusion. Resolution of bilateral mastoid effusion. Electronically Signed   By: Franchot Gallo M.D.   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 Post SRS. Restaging. EXAM: MRI HEAD WITHOUT AND WITH CONTRAST TECHNIQUE: Multiplanar, multiecho pulse sequences of the brain and surrounding structures were obtained without and with intravenous contrast. CONTRAST:  76mL MULTIHANCE GADOBENATE DIMEGLUMINE 529 MG/ML IV SOLN COMPARISON:  MRI head 11/28/2019 FINDINGS: BRAIN New Lesions: None. Larger lesions: Large mass in the cerebellar vermis is larger now measuring 4.9 x 3.3 cm, compared with 3.3 x 3.0 cm. Extensive central necrosis. Surrounding edema is similar. Multiple foci of microhemorrhage in the lesion better seen on the current study. Right inferior cerebellar hemispheric lesion shows progression of enhancement. There is underlying hemorrhage in  this lesion. The enhancing component now measures approximately 16 x 22 mm. Improvement in surrounding edema. Right anterior cerebellar lesion is larger now measuring 11 x 9 mm, previously 7.5 x 6 mm. Surrounding edema similar. Superior cerebellar vermis lesion slightly larger now measuring 15 x 10 mm compared with 12 x 10 mm. Central necrosis. Surrounding edema unchanged. Stable or Smaller lesions: Meningeal enhancement in the right posterior temporal lobe just above the mastoid sinus is unchanged. Generalized atrophy. Mild chronic microvascular ischemic change in the white matter. Marland Kitchen IMPRESSION: 1. Progression of multiple enhancing mass lesions in the posterior fossa which may be due to tumor progression or pseudoprogression. 2. Leptomeningeal enhancement in the right posterior temporal lobe is unchanged. This corresponds to a hemorrhagic contusion on the prior CT of 11/27/2019 3. No new lesions identified. Electronically Signed: By: Franchot Gallo M.D. On: 01/17/2020 13:20   VAS [place] LOWER EXTREMITY VENOUS (DVT) (MC and WL 7a-7p)  Result Date: 01/31/2020  Lower Venous DVTStudy Indications: Pulmonary embolism, and Swelling.  Risk Factors: Cancer. Limitations: Poor ultrasound/tissue interface. Comparison Study: No prior studies. Performing Technologist: Oliver Hum RVT  Examination Guidelines: A complete evaluation includes B-mode imaging, spectral Doppler, color Doppler, and power Doppler as needed of all accessible portions of each vessel. Bilateral testing is considered an integral part of a complete examination. Limited examinations for reoccurring indications may be performed as noted. The reflux portion of the exam is performed with the patient in reverse Trendelenburg.  +---------+---------------+---------+-----------+----------+--------------+ RIGHT    CompressibilityPhasicitySpontaneityPropertiesThrombus Aging +---------+---------------+---------+-----------+----------+--------------+ CFV       Full           Yes      Yes                                 +---------+---------------+---------+-----------+----------+--------------+ SFJ      Full                                                        +---------+---------------+---------+-----------+----------+--------------+ FV Prox  Full                                                        +---------+---------------+---------+-----------+----------+--------------+  FV Mid   Full                                                        +---------+---------------+---------+-----------+----------+--------------+ FV DistalFull                                                        +---------+---------------+---------+-----------+----------+--------------+ PFV      Full                                                        +---------+---------------+---------+-----------+----------+--------------+ POP      Full           Yes      Yes                                 +---------+---------------+---------+-----------+----------+--------------+ PTV      Full                                                        +---------+---------------+---------+-----------+----------+--------------+ PERO     None                                         Acute          +---------+---------------+---------+-----------+----------+--------------+   +---------+---------------+---------+-----------+----------+--------------+ LEFT     CompressibilityPhasicitySpontaneityPropertiesThrombus Aging +---------+---------------+---------+-----------+----------+--------------+ CFV      Full           Yes      Yes                                 +---------+---------------+---------+-----------+----------+--------------+ SFJ      Full                                                        +---------+---------------+---------+-----------+----------+--------------+ FV Prox  Full                                                         +---------+---------------+---------+-----------+----------+--------------+ FV Mid   Full                                                        +---------+---------------+---------+-----------+----------+--------------+  FV DistalFull                                                        +---------+---------------+---------+-----------+----------+--------------+ PFV      Full                                                        +---------+---------------+---------+-----------+----------+--------------+ POP      Full           Yes      Yes                                 +---------+---------------+---------+-----------+----------+--------------+ PTV      Full                                                        +---------+---------------+---------+-----------+----------+--------------+ PERO     Full                                                        +---------+---------------+---------+-----------+----------+--------------+     Summary: RIGHT: - Findings consistent with acute deep vein thrombosis involving the right peroneal veins. - No cystic structure found in the popliteal fossa.  LEFT: - There is no evidence of deep vein thrombosis in the lower extremity.  - No cystic structure found in the popliteal fossa.  *See table(s) above for measurements and observations. Electronically signed by Deitra Mayo MD on 01/31/2020 at 1:29:45 PM.    Final     Labs:  CBC: Recent Labs    12/13/19 0543 12/16/19 0528 01/31/20 1039 02/01/20 0921  WBC 12.1* 9.8 11.1* 8.5  HGB 13.4 13.2 13.4 13.7  HCT 39.2 38.6 39.3 39.5  PLT 249 251 172 178    COAGS: Recent Labs    11/27/19 1939 01/31/20 1055  INR 1.0 1.0  APTT 23* 21*    BMP: Recent Labs    12/09/19 0545 12/13/19 0543 12/16/19 0528 01/31/20 1039  NA 138 138 138 137  K 3.8 4.5 3.9 4.1  CL 102 105 99 105  CO2 26 24 24 24   GLUCOSE 93 90 92 105*  BUN 18 22 19 20   CALCIUM 9.1  9.2 9.2 8.7*  CREATININE 0.81 0.80 0.80 0.84  GFRNONAA >60 >60 >60 >60  GFRAA >60 >60 >60 >60    LIVER FUNCTION TESTS: Recent Labs    11/27/19 1939 11/28/19 0435 12/05/19 0512 12/13/19 0543  BILITOT 0.7 0.7 0.8 0.4  AST 19 18 12* 15  ALT 14 13 16 24   ALKPHOS 76 79 62 61  PROT 6.7 6.6 5.4* 5.4*  ALBUMIN 4.1 3.9 3.0* 3.1*    TUMOR MARKERS: No results for input(s): AFPTM, CEA, CA199, CHROMGRNA in the last 8760 hours.  Assessment and Plan:  Lung Ca Brain mets with parenchymal hemorrhage Syncopal episodes +B PE; + RLE DVT Not a candidate for [facility] Scheduled now for retrievable IVC filter Risks and benefits discussed with the patient's husband [name] via phone including, but not limited to bleeding, infection, contrast induced renal failure, filter fracture or migration which can lead to emergency surgery or even death, strut penetration with damage or irritation to adjacent structures and caval thrombosis.  All questions were answered, he is agreeable to proceed. Consent signed and in chart.    Thank you for this interesting consult.  I greatly enjoyed meeting MASHELL SIEBEN and look forward to participating in their care.  A copy of this report was sent to the requesting provider on this date.  Electronically Signed: Lavonia Drafts, PA-C 02/01/2020, 10:33 AM   I spent a total of 20 Minutes    in face to face in clinical consultation, greater than 50% of which was counseling/coordinating care for IVC filter placement

## 2020-02-01 NOTE — Consult Note (Signed)
Palliative care consult note  Reason for consult: Goals of care in light of metastatic disease with intracranial hemorrhage and pulmonary embolus  Chart reviewed including personal review of pertinent labs and imaging.  Discussed case with bedside care team as well as Dr. Starla Link.  Ms. Ferraiolo is a 69 year old female who is known to our service from prior encounters.  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  CT on presentation showed extensive edema with bilateral cerebral hemispheres at the site of multiple known cerebellar metastasis with 7 mm focus of parenchymal hemorrhage.  CT angiogram of the chest showed extensive bilateral pulmonary emboli.  Right lower extremity Doppler showed DVT.  Unfortunately, she has multiple sequelae of cancer that are irreversible.  Palliative consulted for goals of care.  I called and spoke with patient's son, [name].  [name] reports understanding the severity of her condition and that we are at a point where she has irreversible disease that is going to worsen as treatment for one of her problems (hemorrhage), will be worsened with treatment of her other problem (pulmonary emboli).  He states being told that time is likely short and he and his father discussed and changed her CODE STATUS to DNR today.  [name] is very open about discussion regarding the fact that Ms. Joffe is transitioning the process of actively dying.  We discussed options for care moving forward including transition to hospice.  Discussed potential for home with hospice versus residential hospice placement.  Jeani Hawking reports that [facility] would be preference as this would be closest for his father to come and be with Ms. Palo.  Hospice has been following  and in my discussion with them, there is not a bed available at [facility] today.  I discussed with [name] plan to add on medications if needed in case symptoms arise and reassess her situation tomorrow.  He will continue to talk with his family regarding potential transition to [facility].  I met today with Ms. Stecklein.  She is lying in bed with no acute distress.  She is awake and alert and can follow some conversation but she does not have insight into the severity of her condition.  Her husband was also present at the bedside.  She had just had lunch and states that it was good to get some food she has been hungry.  We gently discussed the fact that she has irreversible illness and her condition is going to worsen moving forward.  Discussed things most important to her being with her family, good symptom management, and hoping to be out of the hospital.  We did not specifically discuss hospice as the best way to work toward these goals today.  I did tell her that I would be working conjunction with her son to figure out where would be the best place to focus on the things that are most important to her.  - DNR/DNI - Currently, Ms. Schoneman does not have IV access.  She pulls out IVs when they are placed.  I therefore added on oral medications for pain, anxiety, and agitation if the need arises. - Discussed potential transition to hospice (likely residential hospice) with patient's son today.  He is going to discuss further with his father this evening.  Plan  to discuss further with hospice liaison from [facility] regarding potential transition to [facility] for end-of-life care.  At this point, patient is awake and alert but she does not really have understanding of the severity of her condition.  We will continue to work with her regarding helping her to achieve goals of good symptom management and being out of the hospital.  While I did not specifically discuss hospice with her today, this would be the  best way to achieve these goals moving forward.  Family to discuss further this evening.   - PMT will plan to follow-up tomorrow.  Start time: 1500 End time: 1625 Total time: 85 minutes  Greater than 50%  of this time was spent counseling and coordinating care related to the above assessment and plan.  Micheline Rough, MD [place] Team [phone]

## 2020-02-01 NOTE — Progress Notes (Signed)
A consult was placed to IV Therapy to restart another peripheral iv;  Pt has pulled out the last 2 ivs, and has refursed restarts, per progress note;  She is on all oral meds at this point with the exception of Zofran on a PRN basis;  Spoke with the RN who will place another consult if the pt is going for any tests or procedures today;  IV not restarted at this time;

## 2020-02-01 NOTE — Progress Notes (Signed)
Patient ID: Hailey Hill, female   DOB: 1951-08-31, 69 y.o.   MRN: [id number]  PROGRESS NOTE    Hailey Hill  SHF:[id number] DOB: [birth year] DOA: 01/31/2020 PCP: [name], NP   Brief Narrative:  69 year old female with history of SCLC with SVC syndrome, status post fall with humerus fracture and 06/2019, cerebellar mets treated with XRT, left basal and subclavian vein thrombosis, peripheral neuropathy had syncopal episode on 01/31/2020 at radiation oncology office.  On presentation, CT of her brain showed extensive edema within bilateral cerebral hemispheres at the site of multiple known cerebellar metastases with 7 mm focus of parenchymal hemorrhage within the superior medial left cerebellum.  CT angiogram of the chest showed extensive bilateral pulmonary embolism.  Right lower extremity Doppler showed findings consistent with acute DVT involving the right peroneal veins.  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 Marin Comment oncology as well as Dr. Milas Kocher care.  Dr. Mickeal Skinner indicated that patient's overall condition has gotten worse and her care at this point is futile because of worsening brain metastases and now that she has new PE with parenchymal hemorrhage as well, she is not a candidate for any kind of treatment including anticoagulation.  He recommended that family consider residential hospice.  Explained the same thing to the patient's son/father on phone and they agree that patient should be made a DNR.  Will cancel IR placement of IVC filter.  They are leaning towards  residential hospice.  I have let Dr. Domingo Cocking know about the same.  Overall prognosis is very very poor.   DVT prophylaxis: DC SCDs for comfort measures Code Status: Change CODE STATUS to DNR Family Communication: Spoke to son/father on phone on  02/01/2020 Disposition Plan: Probable residential hospice once family agrees and arrangements have been made  Consultants: Spoke to neuro oncology/Dr. Mickeal Skinner on phone and palliative care/Dr. Domingo Cocking on phone as well  Procedures: None  Antimicrobials: None   Subjective: Patient seen and examined at bedside.  She is sleepy, wakes up slightly on calling her name, confused to time.  As per nursing staff, she was pulling out her IV this morning.  Objective: Vitals:   01/31/20 1811 01/31/20 2221 02/01/20 0148 02/01/20 0614  BP: 135/82 123/76 133/78 120/71  Pulse: 75 77 75 75  Resp: 20 18 18 18   Temp: 97.7 F (36.5 C) 98.1 F (36.7 C) 97.8 F (36.6 C) 98.2 F (36.8 C)  TempSrc: Oral Oral Oral Oral  SpO2: 98% 95% 98% 100%  Weight: 68.2 kg     Height: 5\' 8"  (1.727 m)       Intake/Output Summary (Last 24 hours) at 02/01/2020 1113 Last data filed at 02/01/2020 0850 Gross per 24 hour  Intake 300 ml  Output 400 ml  Net -100 ml   Filed Weights   01/31/20 1811  Weight: 68.2 kg    Examination:  General exam: Appears calm and comfortable.  Looks chronically ill and older than stated age Respiratory system: Bilateral decreased breath sounds at bases with scattered crackles Cardiovascular system: S1 & S2 heard, Rate controlled Gastrointestinal system: Abdomen is nondistended, soft and nontender. Normal bowel sounds heard. Extremities: No  cyanosis, clubbing; trace lower extremity edema Central nervous system: Sleepy, wakes up slightly, confused to time.  No focal neurological deficits. Moving extremities Skin: No rashes, lesions or ulcers Psychiatry: Could not be assessed because of mental status.    Data Reviewed: I have personally  reviewed following labs and imaging studies  CBC: Recent Labs  Lab 01/31/20 1039 02/01/20 0921  WBC 11.1* 8.5  NEUTROABS 9.8* 6.3  HGB 13.4 13.7  HCT 39.3 39.5  MCV 91.6 90.2  PLT 172 161   Basic Metabolic Panel: Recent Labs  Lab 01/31/20 1039 02/01/20 0921  NA 137 137  K 4.1 3.6  CL 105 103  CO2 24 24  GLUCOSE 105* 93  BUN 20 15  CREATININE 0.84 0.64  CALCIUM 8.7* 8.9  MG  --  2.3   GFR: Estimated Creatinine Clearance: 68.8 mL/min (by C-G formula based on SCr of 0.64 mg/dL). Liver Function Tests: Recent Labs  Lab 02/01/20 0921  AST 11*  ALT 18  ALKPHOS 69  BILITOT 1.1  PROT 5.8*  ALBUMIN 3.5   No results for input(s): LIPASE, AMYLASE in the last 168 hours. No results for input(s): AMMONIA in the last 168 hours. Coagulation Profile: Recent Labs  Lab 01/31/20 1055  INR 1.0   Cardiac Enzymes: No results for input(s): CKTOTAL, CKMB, CKMBINDEX, TROPONINI in the last 168 hours. BNP (last 3 results) No results for input(s): PROBNP in the last 8760 hours. HbA1C: No results for input(s): HGBA1C in the last 72 hours. CBG: Recent Labs  Lab 01/31/20 1048  GLUCAP 101*   Lipid Profile: No results for input(s): CHOL, HDL, LDLCALC, TRIG, CHOLHDL, LDLDIRECT in the last 72 hours. Thyroid Function Tests: No results for input(s): TSH, T4TOTAL, FREET4, T3FREE, THYROIDAB in the last 72 hours. Anemia Panel: No results for input(s): VITAMINB12, FOLATE, FERRITIN, TIBC, IRON, RETICCTPCT in the last 72 hours. Sepsis Labs: Recent Labs  Lab 01/31/20 1510 01/31/20 1829  LATICACIDVEN 1.8 1.3    Recent Results (from the past 240 hour(s))  SARS CORONAVIRUS 2 (TAT 6-24 HRS) Nasopharyngeal Nasopharyngeal Swab     Status: None   Collection Time: 01/31/20 12:31 PM   Specimen: Nasopharyngeal Swab  Result Value Ref Range Status   SARS Coronavirus 2 NEGATIVE NEGATIVE Final    Comment: (NOTE) SARS-CoV-2 target nucleic acids are NOT DETECTED. The SARS-CoV-2 RNA is generally  detectable in upper and lower respiratory specimens during the acute phase of infection. Negative results do not preclude SARS-CoV-2 infection, do not rule out co-infections with other pathogens, and should not be used as the sole basis for treatment or other patient management decisions. Negative results must be combined with clinical observations, patient history, and epidemiological information. The expected result is Negative. Fact Sheet for Patients: SugarRoll.be Fact Sheet for Healthcare Providers: https://www.woods-mathews.com/ This test is not yet approved or cleared by the [place] FDA and  has been authorized for detection and/or diagnosis of SARS-CoV-2 by FDA under an Emergency Use Authorization (EUA). This EUA will remain  in effect (meaning this test can be used) for the duration of the COVID-19 declaration under Section 56 4(b)(1) of the Act, 21 U.S.C. section 360bbb-3(b)(1), unless the authorization is terminated or revoked sooner. Performed at [facility], [place] [address], [place], [place] [address]          Radiology Studies: CT HEAD WO CONTRAST  Result Date: 02/01/2020 CLINICAL DATA:  Small cell lung cancer metastatic to brain. Follow-up cerebral hemorrhage EXAM: CT HEAD WITHOUT CONTRAST TECHNIQUE:  Contiguous axial images were obtained from the base of the skull through the vertex without intravenous contrast. COMPARISON:  CT head 01/31/2020.  MRI 01/17/2020 FINDINGS: Brain: 8 mm hyperdensity in the left superior and posterior cerebellum unchanged compatible with acute hemorrhage. This is adjacent to known metastatic deposit in the vermis. No new areas of acute hemorrhage Extensive low density throughout the cerebellar hemispheres bilaterally right greater than left compatible with known metastatic disease. Mass-effect on the fourth ventricle unchanged. Mild ventricular enlargement is stable. There is generalized  atrophy but there may be an element of mild obstructive hydrocephalus. No acute infarct. Vascular: Negative for hyperdense vessel Skull: Negative Sinuses/Orbits: Mild mucosal edema paranasal sinuses. Negative orbit Other: None IMPRESSION: 8 mm acute hemorrhage left superior cerebellum is unchanged from yesterday. This is most likely related to tumoral hemorrhage. Metastatic disease in the cerebellum bilaterally right greater than left unchanged. There is posterior fossa edema and mass-effect on the fourth ventricle. Possible mild obstructive hydrocephalus. Ventricular size is stable. Electronically Signed   By: Franchot Gallo M.D.   On: 02/01/2020 11:02   CT Head W or Wo Contrast  Result Date: 01/31/2020 CLINICAL DATA:  Syncope, altered mental status, concern for worsening metastatic disease to brain; syncope, recurrent, small cell lung cancer, assess treatment response. EXAM: CT HEAD WITHOUT AND WITH CONTRAST TECHNIQUE: Contiguous axial images were obtained from the base of the skull through the vertex without and with intravenous contrast CONTRAST:  143mL OMNIPAQUE IOHEXOL 350 MG/ML SOLN COMPARISON:  Brain MRI 01/17/2020, brain MRI 11/28/2019, head CT 11/27/2019 FINDINGS: Brain: Again demonstrated is extensive edema within the bilateral cerebellar hemispheres at sites of known metastatic lesions. There is a 7 mm focus of parenchymal hemorrhage within the superomedial left cerebellum which is in close proximity to a known metastatic lesion within the midline cerebellum. There is limited evaluation for new intracranial metastases on this noncontrast head CT. Similar posterior fossa mass effect with partial effacement of the fourth ventricle. The lateral and third ventricles are unchanged in size and configuration. A small nodular enhancing lesion along the margin of the atrium of the right lateral ventricle is better appreciated on prior MRI 01/17/2020. No supratentorial cortical infarct is identified. There is  no midline shift or extra-axial fluid collection. Stable generalized parenchymal atrophy. Vascular: No hyperdense vessel. Skull: Normal. Negative for fracture or focal lesion. Sinuses/Orbits: Visualized orbits demonstrate no acute abnormality. No significant paranasal sinus disease or mastoid effusion at the imaged levels. These results were called by telephone at the time of interpretation on 01/31/2020 at 12:12 pm to provider [facility] , who verbally acknowledged these results. IMPRESSION: Redemonstrated extensive edema within the bilateral cerebellar hemispheres at site of multiple known cerebellar metastases. There is a new 7 mm focus of parenchymal hemorrhage within the superomedial left cerebellum which is in close proximity to a known metastatic lesion. Unchanged posterior fossa mass effect. An additional small metastatic lesion along the margin of the atrium of the right lateral ventricle was better appreciated on MRI 01/17/2020. Please note there is limited assessment for new intracranial metastases on this noncontrast head CT. Electronically Signed   By: Kellie Simmering DO   On: 01/31/2020 12:12   CT Angio Chest PE W and/or Wo Contrast  Result Date: 01/31/2020 CLINICAL DATA:  Shortness of breath. History of small cell lung cancer EXAM: CT ANGIOGRAPHY CHEST WITH CONTRAST TECHNIQUE: Multidetector CT imaging of the chest was performed using the standard protocol during bolus administration of intravenous contrast. Multiplanar CT image reconstructions  and MIPs were obtained to evaluate the vascular anatomy. CONTRAST:  169mL OMNIPAQUE IOHEXOL 350 MG/ML SOLN COMPARISON:  10/29/2019, 12/15/2015 FINDINGS: Cardiovascular: Pulmonary arteries are adequately opacified. Extensive filling defects within the right and left main branch pulmonary arteries with clot extending into lobar and segmental branches of all lobes. No saddle embolism. RV to LV ratio of 1.1 with slight leftward bowing of the intraventricular  septum. Normal heart size. No pericardial effusion. Thoracic aorta nonaneurysmal. Mediastinum/Nodes: No axillary, mediastinal, or hilar lymphadenopathy. Stable 10 mm left thyroid lobe nodule. No followup recommended (ref: J Am Coll Radiol. 2015 Feb;12(2): 143-50). Trachea and esophagus within normal limits. Lungs/Pleura: Band like area of consolidation and scarring in the left perihilar region appears stable compared to prior, compatible with post treatment changes. Otherwise, no focal airspace consolidation, pleural effusion, or pneumothorax. Upper Abdomen: No acute abnormality. Musculoskeletal: No chest wall abnormality. No acute or significant osseous findings. Review of the MIP images confirms the above findings. IMPRESSION: 1. Extensive bilateral pulmonary emboli with involvement of the right and left main branch pulmonary arteries with clot extending into lobar and segmental branches of all lobes. No saddle embolism. RV to LV ratio of 1.1 with slight bowing of the intraventricular septum suggesting right heart strain. 2. Stable post treatment changes in the left perihilar region. These results were called by telephone at the time of interpretation on 01/31/2020 at 12:08 pm to provider [facility] , who verbally acknowledged these results. Electronically Signed   By: Davina Poke D.O.   On: 01/31/2020 12:09   VAS [place] LOWER EXTREMITY VENOUS (DVT) ([facility] and [facility] 7a-7p)  Result Date: 01/31/2020  Lower Venous DVTStudy Indications: Pulmonary embolism, and Swelling.  Risk Factors: Cancer. Limitations: Poor ultrasound/tissue interface. Comparison Study: No prior studies. Performing Technologist: Oliver Hum RVT  Examination Guidelines: A complete evaluation includes B-mode imaging, spectral Doppler, color Doppler, and power Doppler as needed of all accessible portions of each vessel. Bilateral testing is considered an integral part of a complete examination. Limited examinations for reoccurring indications  may be performed as noted. The reflux portion of the exam is performed with the patient in reverse Trendelenburg.  +---------+---------------+---------+-----------+----------+--------------+ RIGHT    CompressibilityPhasicitySpontaneityPropertiesThrombus Aging +---------+---------------+---------+-----------+----------+--------------+ CFV      Full           Yes      Yes                                 +---------+---------------+---------+-----------+----------+--------------+ SFJ      Full                                                        +---------+---------------+---------+-----------+----------+--------------+ FV Prox  Full                                                        +---------+---------------+---------+-----------+----------+--------------+ FV Mid   Full                                                        +---------+---------------+---------+-----------+----------+--------------+  FV DistalFull                                                        +---------+---------------+---------+-----------+----------+--------------+ PFV      Full                                                        +---------+---------------+---------+-----------+----------+--------------+ POP      Full           Yes      Yes                                 +---------+---------------+---------+-----------+----------+--------------+ PTV      Full                                                        +---------+---------------+---------+-----------+----------+--------------+ PERO     None                                         Acute          +---------+---------------+---------+-----------+----------+--------------+   +---------+---------------+---------+-----------+----------+--------------+ LEFT     CompressibilityPhasicitySpontaneityPropertiesThrombus Aging +---------+---------------+---------+-----------+----------+--------------+ CFV       Full           Yes      Yes                                 +---------+---------------+---------+-----------+----------+--------------+ SFJ      Full                                                        +---------+---------------+---------+-----------+----------+--------------+ FV Prox  Full                                                        +---------+---------------+---------+-----------+----------+--------------+ FV Mid   Full                                                        +---------+---------------+---------+-----------+----------+--------------+ FV DistalFull                                                        +---------+---------------+---------+-----------+----------+--------------+  PFV      Full                                                        +---------+---------------+---------+-----------+----------+--------------+ POP      Full           Yes      Yes                                 +---------+---------------+---------+-----------+----------+--------------+ PTV      Full                                                        +---------+---------------+---------+-----------+----------+--------------+ PERO     Full                                                        +---------+---------------+---------+-----------+----------+--------------+     Summary: RIGHT: - Findings consistent with acute deep vein thrombosis involving the right peroneal veins. - No cystic structure found in the popliteal fossa.  LEFT: - There is no evidence of deep vein thrombosis in the lower extremity.  - No cystic structure found in the popliteal fossa.  *See table(s) above for measurements and observations. Electronically signed by Deitra Mayo MD on 01/31/2020 at 1:29:45 PM.    Final         Scheduled Meds: . dexamethasone  4 mg Oral Daily  . docusate sodium  100 mg Oral Daily  . pantoprazole  40 mg Oral Daily  .  pentoxifylline  400 mg Oral TID WC  . vitamin E  400 Units Oral Daily   Continuous Infusions:        Aline August, MD [facility] 02/01/2020, 11:13 AM

## 2020-02-02 NOTE — Progress Notes (Signed)
Patient ID: Hailey Hill, female   DOB: 1951/04/20, 69 y.o.   MRN: [id number]  PROGRESS NOTE    Hailey Hill  BFX:[id number] DOB: 1950/12/15 DOA: 01/31/2020 PCP: [name], NP   Brief Narrative:  69 year old female with history of SCLC with SVC syndrome, status post fall with humerus fracture and 06/2019, cerebellar mets treated with XRT, left basal and subclavian vein thrombosis, peripheral neuropathy had syncopal episode on 01/31/2020 at radiation oncology office.  On presentation, CT of her brain showed extensive edema within bilateral cerebral hemispheres at the site of multiple known cerebellar metastases with 7 mm focus of parenchymal hemorrhage within the superior medial left cerebellum.  CT angiogram of the chest showed extensive bilateral pulmonary embolism.  Right lower extremity Doppler showed findings consistent with acute DVT involving the right peroneal veins.  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  Patient has been made DNR and family is contemplating residential hospice.  Palliative care following.  DVT prophylaxis: None for comfort measures Code Status: DNR  family Communication: Spoke to son/father on phone on  02/01/2020 Disposition Plan: Probable residential hospice once family agrees and arrangements have been made  Consultants: Spoke to neuro oncology/Dr. Mickeal Skinner on phone on 02/01/2020; palliative care  Procedures: None  Antimicrobials: None   Subjective: Patient seen and examined at bedside.  Poor historian.  Sleepy, hardly wakes up or answers any questions.  No overnight fever, vomiting or agitation reported by nursing  staff. Objective: Vitals:   02/01/20 0614 02/01/20 1253 02/01/20 2037 02/02/20 0428  BP: 120/71 115/73 110/80 110/80  Pulse: 75 88 87 84  Resp: 18 20 18 18   Temp: 98.2 F (36.8 C) 97.9 F (36.6 C) 98 F (36.7 C) (!) 97.3 F (36.3 C)  TempSrc: Oral Oral Oral Oral  SpO2: 100% 98% 98% 96%  Weight:      Height:        Intake/Output Summary (Last 24 hours) at 02/02/2020 0739 Last data filed at 02/01/2020 0850 Gross per 24 hour  Intake --  Output 400 ml  Net -400 ml   Filed Weights   01/31/20 1811  Weight: 68.2 kg    Examination:  General exam: No acute distress.  Sleepy, hardly wakes up or answers any questions. Respiratory system: Bilateral decreased breath sounds at bases, no wheezing.  Some crackles  cardiovascular system: Rate controlled, S1-S2 heard Gastrointestinal system: Abdomen is nondistended, soft and nontender. Normal bowel sounds heard. Extremities: No cyanosis, clubbing; trace lower extremity edema   Data Reviewed: I have personally reviewed following labs and imaging studies  CBC: Recent Labs  Lab 01/31/20 1039 02/01/20 0921  WBC 11.1* 8.5  NEUTROABS 9.8* 6.3  HGB 13.4 13.7  HCT 39.3 39.5  MCV 91.6 90.2  PLT 172 060   Basic Metabolic Panel: Recent Labs  Lab 01/31/20 1039 02/01/20 0921  NA 137 137  K 4.1 3.6  CL 105 103  CO2 24 24  GLUCOSE 105* 93  BUN 20 15  CREATININE 0.84 0.64  CALCIUM 8.7* 8.9  MG  --  2.3   GFR: Estimated Creatinine Clearance: 68.8 mL/min (by C-G formula based on SCr of 0.64 mg/dL). Liver Function Tests: Recent Labs  Lab 02/01/20 0921  AST 11*  ALT 18  ALKPHOS 69  BILITOT 1.1  PROT 5.8*  ALBUMIN 3.5   No results for input(s): LIPASE, AMYLASE in the last 168 hours. No results for input(s): AMMONIA in the last 168 hours. Coagulation Profile: Recent Labs  Lab 01/31/20 1055  INR 1.0   Cardiac Enzymes: No results for input(s): CKTOTAL, CKMB, CKMBINDEX, TROPONINI in the last 168 hours. BNP (last 3  results) No results for input(s): PROBNP in the last 8760 hours. HbA1C: No results for input(s): HGBA1C in the last 72 hours. CBG: Recent Labs  Lab 01/31/20 1048  GLUCAP 101*   Lipid Profile: No results for input(s): CHOL, HDL, LDLCALC, TRIG, CHOLHDL, LDLDIRECT in the last 72 hours. Thyroid Function Tests: No results for input(s): TSH, T4TOTAL, FREET4, T3FREE, THYROIDAB in the last 72 hours. Anemia Panel: No results for input(s): VITAMINB12, FOLATE, FERRITIN, TIBC, IRON, RETICCTPCT in the last 72 hours. Sepsis Labs: Recent Labs  Lab 01/31/20 1510 01/31/20 1829  LATICACIDVEN 1.8 1.3    Recent Results (from the past 240 hour(s))  SARS CORONAVIRUS 2 (TAT 6-24 HRS) Nasopharyngeal Nasopharyngeal Swab     Status: None   Collection Time: 01/31/20 12:31 PM   Specimen: Nasopharyngeal Swab  Result Value Ref Range Status   SARS Coronavirus 2 NEGATIVE NEGATIVE Final    Comment: (NOTE) SARS-CoV-2 target nucleic acids are NOT DETECTED. The SARS-CoV-2 RNA is generally detectable in upper and lower respiratory specimens during the acute phase of infection. Negative results do not preclude SARS-CoV-2 infection, do not rule out co-infections with other pathogens, and should not be used as the sole basis for treatment or other patient management decisions. Negative results must be combined with clinical observations, patient history, and epidemiological information. The expected result is Negative. Fact Sheet for Patients: SugarRoll.be Fact Sheet for Healthcare Providers: https://www.woods-mathews.com/ This test is not yet approved or cleared by the [place] FDA and  has been authorized for detection and/or diagnosis of SARS-CoV-2 by FDA under an Emergency Use Authorization (EUA). This EUA will remain  in effect (meaning this test can be used) for the duration of the COVID-19 declaration under Section 56 4(b)(1) of the Act, 21 U.S.C. section  360bbb-3(b)(1), unless the authorization is terminated or revoked sooner. Performed at [facility], [place] [address] [place], [place] [address]          Radiology Studies: CT HEAD WO CONTRAST  Result Date: 02/01/2020 CLINICAL DATA:  Small cell lung cancer metastatic to brain. Follow-up cerebral hemorrhage EXAM: CT HEAD WITHOUT CONTRAST TECHNIQUE: Contiguous axial images were obtained from the base of the skull through the vertex without intravenous contrast. COMPARISON:  CT head 01/31/2020.  MRI 01/17/2020 FINDINGS: Brain: 8 mm hyperdensity in the left superior and posterior cerebellum unchanged compatible with acute hemorrhage. This is adjacent to known metastatic deposit in the vermis. No new areas of acute hemorrhage Extensive low density throughout the cerebellar hemispheres bilaterally right greater than left compatible with known metastatic disease. Mass-effect on the fourth ventricle unchanged. Mild ventricular enlargement is stable. There is generalized atrophy but there may be an element of mild obstructive hydrocephalus. No acute infarct. Vascular: Negative for hyperdense vessel Skull: Negative Sinuses/Orbits: Mild mucosal edema paranasal sinuses. Negative orbit Other: None IMPRESSION: 8 mm acute hemorrhage left superior cerebellum is unchanged from yesterday. This is most likely related to tumoral hemorrhage. Metastatic disease in the cerebellum bilaterally right greater than left unchanged. There is posterior fossa edema and mass-effect on the fourth ventricle. Possible mild obstructive hydrocephalus. Ventricular  size is stable. Electronically Signed   By: Franchot Gallo M.D.   On: 02/01/2020 11:02   CT Head W or Wo Contrast  Result Date: 01/31/2020 CLINICAL DATA:  Syncope, altered mental status, concern for worsening metastatic disease to brain; syncope, recurrent, small cell lung cancer, assess treatment response. EXAM: CT HEAD WITHOUT AND WITH CONTRAST TECHNIQUE: Contiguous  axial images were obtained from the base of the skull through the vertex without and with intravenous contrast CONTRAST:  167mL OMNIPAQUE IOHEXOL 350 MG/ML SOLN COMPARISON:  Brain MRI 01/17/2020, brain MRI 11/28/2019, head CT 11/27/2019 FINDINGS: Brain: Again demonstrated is extensive edema within the bilateral cerebellar hemispheres at sites of known metastatic lesions. There is a 7 mm focus of parenchymal hemorrhage within the superomedial left cerebellum which is in close proximity to a known metastatic lesion within the midline cerebellum. There is limited evaluation for new intracranial metastases on this noncontrast head CT. Similar posterior fossa mass effect with partial effacement of the fourth ventricle. The lateral and third ventricles are unchanged in size and configuration. A small nodular enhancing lesion along the margin of the atrium of the right lateral ventricle is better appreciated on prior MRI 01/17/2020. No supratentorial cortical infarct is identified. There is no midline shift or extra-axial fluid collection. Stable generalized parenchymal atrophy. Vascular: No hyperdense vessel. Skull: Normal. Negative for fracture or focal lesion. Sinuses/Orbits: Visualized orbits demonstrate no acute abnormality. No significant paranasal sinus disease or mastoid effusion at the imaged levels. These results were called by telephone at the time of interpretation on 01/31/2020 at 12:12 pm to provider [facility] , who verbally acknowledged these results. IMPRESSION: Redemonstrated extensive edema within the bilateral cerebellar hemispheres at site of multiple known cerebellar metastases. There is a new 7 mm focus of parenchymal hemorrhage within the superomedial left cerebellum which is in close proximity to a known metastatic lesion. Unchanged posterior fossa mass effect. An additional small metastatic lesion along the margin of the atrium of the right lateral ventricle was better appreciated on MRI  01/17/2020. Please note there is limited assessment for new intracranial metastases on this noncontrast head CT. Electronically Signed   By: Kellie Simmering DO   On: 01/31/2020 12:12   CT Angio Chest PE W and/or Wo Contrast  Result Date: 01/31/2020 CLINICAL DATA:  Shortness of breath. History of small cell lung cancer EXAM: CT ANGIOGRAPHY CHEST WITH CONTRAST TECHNIQUE: Multidetector CT imaging of the chest was performed using the standard protocol during bolus administration of intravenous contrast. Multiplanar CT image reconstructions and MIPs were obtained to evaluate the vascular anatomy. CONTRAST:  155mL OMNIPAQUE IOHEXOL 350 MG/ML SOLN COMPARISON:  10/29/2019, 12/15/2015 FINDINGS: Cardiovascular: Pulmonary arteries are adequately opacified. Extensive filling defects within the right and left main branch pulmonary arteries with clot extending into lobar and segmental branches of all lobes. No saddle embolism. RV to LV ratio of 1.1 with slight leftward bowing of the intraventricular septum. Normal heart size. No pericardial effusion. Thoracic aorta nonaneurysmal. Mediastinum/Nodes: No axillary, mediastinal, or hilar lymphadenopathy. Stable 10 mm left thyroid lobe nodule. No followup recommended (ref: J Am Coll Radiol. 2015 Feb;12(2): 143-50). Trachea and esophagus within normal limits. Lungs/Pleura: Band like area of consolidation and scarring in the left perihilar region appears stable compared to prior, compatible with post treatment changes. Otherwise, no focal airspace consolidation, pleural effusion, or pneumothorax. Upper Abdomen: No acute abnormality. Musculoskeletal: No chest wall abnormality. No acute or significant osseous findings. Review of the MIP images confirms the above findings. IMPRESSION: 1.  Extensive bilateral pulmonary emboli with involvement of the right and left main branch pulmonary arteries with clot extending into lobar and segmental branches of all lobes. No saddle embolism. RV to LV  ratio of 1.1 with slight bowing of the intraventricular septum suggesting right heart strain. 2. Stable post treatment changes in the left perihilar region. These results were called by telephone at the time of interpretation on 01/31/2020 at 12:08 pm to provider [facility] , who verbally acknowledged these results. Electronically Signed   By: Davina Poke D.O.   On: 01/31/2020 12:09   VAS [place] LOWER EXTREMITY VENOUS (DVT) (MC and WL 7a-7p)  Result Date: 01/31/2020  Lower Venous DVTStudy Indications: Pulmonary embolism, and Swelling.  Risk Factors: Cancer. Limitations: Poor ultrasound/tissue interface. Comparison Study: No prior studies. Performing Technologist: Oliver Hum RVT  Examination Guidelines: A complete evaluation includes B-mode imaging, spectral Doppler, color Doppler, and power Doppler as needed of all accessible portions of each vessel. Bilateral testing is considered an integral part of a complete examination. Limited examinations for reoccurring indications may be performed as noted. The reflux portion of the exam is performed with the patient in reverse Trendelenburg.  +---------+---------------+---------+-----------+----------+--------------+ RIGHT    CompressibilityPhasicitySpontaneityPropertiesThrombus Aging +---------+---------------+---------+-----------+----------+--------------+ CFV      Full           Yes      Yes                                 +---------+---------------+---------+-----------+----------+--------------+ SFJ      Full                                                        +---------+---------------+---------+-----------+----------+--------------+ FV Prox  Full                                                        +---------+---------------+---------+-----------+----------+--------------+ FV Mid   Full                                                         +---------+---------------+---------+-----------+----------+--------------+ FV DistalFull                                                        +---------+---------------+---------+-----------+----------+--------------+ PFV      Full                                                        +---------+---------------+---------+-----------+----------+--------------+ POP      Full           Yes      Yes                                 +---------+---------------+---------+-----------+----------+--------------+  PTV      Full                                                        +---------+---------------+---------+-----------+----------+--------------+ PERO     None                                         Acute          +---------+---------------+---------+-----------+----------+--------------+   +---------+---------------+---------+-----------+----------+--------------+ LEFT     CompressibilityPhasicitySpontaneityPropertiesThrombus Aging +---------+---------------+---------+-----------+----------+--------------+ CFV      Full           Yes      Yes                                 +---------+---------------+---------+-----------+----------+--------------+ SFJ      Full                                                        +---------+---------------+---------+-----------+----------+--------------+ FV Prox  Full                                                        +---------+---------------+---------+-----------+----------+--------------+ FV Mid   Full                                                        +---------+---------------+---------+-----------+----------+--------------+ FV DistalFull                                                        +---------+---------------+---------+-----------+----------+--------------+ PFV      Full                                                         +---------+---------------+---------+-----------+----------+--------------+ POP      Full           Yes      Yes                                 +---------+---------------+---------+-----------+----------+--------------+ PTV      Full                                                        +---------+---------------+---------+-----------+----------+--------------+  PERO     Full                                                        +---------+---------------+---------+-----------+----------+--------------+     Summary: RIGHT: - Findings consistent with acute deep vein thrombosis involving the right peroneal veins. - No cystic structure found in the popliteal fossa.  LEFT: - There is no evidence of deep vein thrombosis in the lower extremity.  - No cystic structure found in the popliteal fossa.  *See table(s) above for measurements and observations. Electronically signed by Deitra Mayo MD on 01/31/2020 at 1:29:45 PM.    Final         Scheduled Meds: . dexamethasone  4 mg Oral Daily  . docusate sodium  100 mg Oral Daily  . pantoprazole  40 mg Oral Daily  . pentoxifylline  400 mg Oral TID WC  . vitamin E  400 Units Oral Daily   Continuous Infusions:        Aline August, MD [facility] 02/02/2020, 7:39 AM

## 2020-02-02 NOTE — Progress Notes (Signed)
Daily Progress Note   Patient Name: ANGELLA MONTAS       Date: 02/02/2020 DOB: Jan 29, 1951  Age: 69 y.o. MRN#: [id number] Attending Physician: Aline August, MD Primary Care Physician: [name], NP Admit Date: 01/31/2020  Reason for Consultation/Follow-up: Disposition, Establishing goals of care and Hospice Evaluation  Subjective: I saw and examined Ms. Arnall today.  She is more lethargic and does not open her eyes but does seem to respond to simple questions (intermittently).  Denied pain, sob, or nausea.  MAR reviewed and has not required symptom management medications.  I called and discussed with her son, [name].  We reviewed information from yesterday and [name] reports that family would like to work to transition to [facility] for end of life care.  I spoke again with Ms. Toor following this and noted plan for discharge to [facility].  She stated, "OK" but I am not sure if she truly followed conversation or not.    Length of Stay: 2  Current Medications: Scheduled Meds:  . dexamethasone  4 mg Oral Daily  . docusate sodium  100 mg Oral Daily  . pantoprazole  40 mg Oral Daily  . pentoxifylline  400 mg Oral TID WC  . vitamin E  400 Units Oral Daily    Continuous Infusions:   PRN Meds: acetaminophen, haloperidol, LORazepam, morphine CONCENTRATE, ondansetron **OR** ondansetron (ZOFRAN) IV  Physical Exam         General: Sleepy. Answers some questions but will not open eyes. Heart: Regular rate and rhythm. No murmur appreciated. Lungs: Decreased air movement, scattered crackles Abdomen: Soft, nontender, nondistended, positive bowel sounds.  Ext: No significant edema Skin: Warm and dry.  Vital Signs: BP 128/73 (BP Location: Left Arm)   Pulse 86   Temp 97.9 F  (36.6 C) (Oral)   Resp 15   Ht 5\' 8"  (1.727 m)   Wt 68.2 kg   SpO2 100%   BMI 22.86 kg/m  SpO2: SpO2: 100 % O2 Device: O2 Device: Room Air O2 Flow Rate:    Intake/output summary: No intake or output data in the 24 hours ending 02/02/20 1324 LBM: Last BM Date: 02/01/20 Baseline Weight: Weight: 68.2 kg Most recent weight: Weight: 68.2 kg       Palliative Assessment/Data:    Flowsheet Rows  Most Recent Value  Intake Tab  Referral Department  Hospitalist  Unit at Time of Referral  Med/Surg Unit  Palliative Care Primary Diagnosis  Cancer  Date Notified  01/31/20  Palliative Care Type  Return patient Palliative Care  Reason for referral  Clarify Goals of Care  Date of Admission  01/31/20  Date first seen by Palliative Care  02/01/20  # of days Palliative referral response time  1 Day(s)  # of days IP prior to Palliative referral  0  Clinical Assessment  Palliative Performance Scale Score  30%  Psychosocial & Spiritual Assessment  Palliative Care Outcomes  Patient/Family meeting held?  Yes  Who was at the meeting?  Patient, husband, son      Patient Active Problem List   Diagnosis Date Noted  . Syncopal episodes 01/31/2020  . Pulmonary embolism ([place]) 01/31/2020  . Acute pulmonary embolism ([place]) 01/31/2020  . Palliative care by specialist   . DNR (do not resuscitate) discussion   . Lung cancer metastatic to brain ([place]) 12/04/2019  . Closed fracture of temporal bone ([place])   . Intracranial hemorrhage, nontraumatic ([place]) 11/27/2019  . Acute lower UTI 11/27/2019  . Fall 07/10/2019  . Closed fracture of head of left humerus   . Metastatic lung cancer (metastasis from lung to other site) ([place]) 07/07/2019  . Fracture, humerus, anatomical neck, left, closed, initial encounter 07/07/2019  . Metastatic cancer to brain ([place]) 07/07/2019  . Chest pain with moderate risk for cardiac etiology 12/14/2015  . Small cell lung cancer ([place]) 09/16/2013    Palliative Care  Assessment & Plan   Patient Profile: 69 year old female who is known to our service from prior encounters.  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  CT on presentation showed extensive edema with bilateral cerebral hemispheres at the site of multiple known cerebellar metastasis with 7 mm focus of parenchymal hemorrhage.  CT angiogram of the chest showed extensive bilateral pulmonary emboli.  Right lower extremity Doppler showed DVT.  Unfortunately, she has multiple sequelae of cancer that are irreversible.  Palliative consulted for goals of care.  Recommendations/Plan: - DNR/DNI - Currently, Ms. Litsey does not have IV access.  She pulls out IVs when they are placed.  I therefore added on oral medications for pain, anxiety, and agitation if the need arises. - Discussed again with her son, [name], regarding transition to residential hospice.  [name] reports family would like to work to have Ms. Keady transitioned to [facility] for end of life care.  I did discuss this also with Ms. Manni, but I am not sure she really follows conversation.  She did agree that goals are spending time with family, being out of pain, and being out of the hospital. - Hopeful for transition to [facility] when bed available.   Code Status:    Code Status Orders  (From admission, onward)         Start     Ordered   02/01/20 1111  Do not attempt resuscitation (DNR)  Continuous    Question Answer Comment  In the event of cardiac or respiratory ARREST Do not call a "code blue"   In the event of cardiac or respiratory ARREST Do not perform Intubation, CPR, defibrillation or ACLS  In the event of cardiac or respiratory ARREST Use medication by any route,  position, wound care, and other measures to relive pain and suffering. May use oxygen, suction and manual treatment of airway obstruction as needed for comfort.      02/01/20 1110        Code Status History    Date Active Date Inactive Code Status Order ID Comments User Context   01/31/2020 1354 02/01/2020 1110 Full Code [id number]  Collier Bullock, MD ED   12/04/2019 1606 12/18/2019 1628 Full Code [id number]  Flora Lipps Inpatient   11/27/2019 2215 12/04/2019 1555 Full Code [id number]  Jani Gravel, MD ED   07/07/2019 2117 07/11/2019 1857 Full Code [id number]  Neena Rhymes, MD ED   12/14/2015 2303 12/16/2015 1751 Full Code [id number]  Etta Quill, DO ED   Advance Care Planning Activity       Prognosis:   < 2 weeks most likely.  While she is still intermittently eating at times, Ms. Maniaci continues to appear weaker and more sleepy/confused.  With her intracranial hemorrhage and multiple PE/DVT, she is at high risk of acute event and death with a great deal of quick onset symptom burden.  I believe that she will be best served by transition to residential hospice as recommended by Dr. Mickeal Skinner.  Discharge Planning:  Hospice facility  Care plan was discussed with patient, son, hospice liaison  Thank you for allowing the Palliative Medicine Team to assist in the care of this patient.   Time In: 1230 Time Out: 1310 Total Time 40 Prolonged Time Billed No      Greater than 50%  of this time was spent counseling and coordinating care related to the above assessment and plan.  Micheline Rough, MD  Please contact Palliative Medicine Team phone at [phone] for questions and concerns.

## 2020-02-02 NOTE — Progress Notes (Signed)
Manufacturing engineer Documentation  Liaison received referral for pt to dc to [facility] for end of life care. [facility] currently does not have a bed to offer.  Pt's chart currently in review by hospice MD to determine eligibility. Liaison will communicate with both TOC and family once eligibility is determined and when bed becomes available.   Please do not hesitate to outreach with any questions and thank you for the referral.   Freddie Breech, RN [facility] Liaison [phone]

## 2020-02-03 NOTE — TOC Progression Note (Addendum)
Transition of Care [facility]) - Progression Note    Patient Details  Name: AMESHA BAILEY MRN: [id number] Date of Birth: May 09, 1951  Transition of Care [facility]) CM/SW Contact  Ross Ludwig, [place] Phone Number: 02/03/2020, 10:06 AM  Clinical Narrative:     CSW received a phone call from [place] at [facility], she said there is a bed available today.  CSW spoke to patient's son [name], and he is in agreement to having patient go to [facility] today.  CSW notified attending physician and [facility] will contact patient's son to set up time to complete paperwork.  CSW to continue to facilitate discharge planning.        Expected Discharge Plan and [facility] residential hospice for end of life care.                                Social Determinants of Health (SDOH) Interventions    Readmission Risk Interventions Readmission Risk Prevention Plan 12/02/2019  Transportation Screening Complete  PCP or Specialist Appt within 5-7 Days Not Complete  Not Complete comments disposition pending  Home Care Screening Complete  Medication Review (RN CM) Referral to Pharmacy  Some recent data might be hidden

## 2020-02-03 NOTE — TOC Transition Note (Addendum)
Transition of Care [facility]) - CM/SW Discharge Note   Patient Details  Name: DONNICA JARNAGIN MRN: [id number] Date of Birth: Mar 16, 1951  Transition [facility]) CM/SW Contact:  Ross Ludwig, LCSW Phone Number: 02/03/2020, 10:57 AM   Clinical Narrative:     Patient to be d/c'ed today to [facility].  .  Patient and family agreeable to plans will transport via ems RN to call report [phone].  Patient's son is aware of patient discharging today, patient will need EMS transport, hospice facility will let CSW know once patient's paperwork has been completed.  12:56pm  CSW was informed that patient's son has completed paperwork.  CSW set up EMS transport for around 2pm, depending on how busy EMS is.  Bedside nurse is aware of pending discharge.   Barriers to Discharge: Barriers Resolved   Patient Goals and CMS Choice Patient states their goals for this hospitalization and ongoing recovery are:: To go to [facility] for end of life care. CMS Medicare.gov Compare Post Acute Care list provided to:: Patient Represenative (must comment) Choice offered to / list presented to : Adult Children  Discharge Placement              Patient chooses bed at: Other - please specify in the comment section below:([facility]) Patient to be transferred to facility by: [facility] Name of family member notified: Patient's son [name] Patient and family notified of of transfer: 02/03/20  Discharge Plan and Services                  DME Agency: NA       HH Arranged: Patient Refused [facility]       Representative spoke with at [place]: na  Social Determinants of Health (SDOH) Interventions     Readmission Risk Interventions Readmission Risk Prevention Plan 12/02/2019  Transportation Screening Complete  PCP or Specialist Appt within 5-7 Days Not Complete  Not Complete comments disposition pending  Home Care Screening Complete  Medication Review (RN CM) Referral to Pharmacy   Some recent data might be hidden

## 2020-02-03 NOTE — Progress Notes (Signed)
Daily Progress Note   Hailey Name: Hailey Hill       Date: 02/03/2020 DOB: [birth year]  Age: 69 y.o. MRN#: [id number] Attending Physician: Kayleen Memos, DO Primary Care Physician: [name], NP Admit Date: 01/31/2020  Reason for Consultation/Follow-up: Disposition, Establishing goals of care and Hospice Evaluation  Subjective: I saw and examined Hailey Hill today.  She does not open her eyes but does seem to respond to simple questions (intermittently).  Denied pain, sob, or nausea.  MAR reviewed and has not required symptom management medications.  Length of Stay: 3  Current Medications: Scheduled Meds:  . dexamethasone  4 mg Oral Daily  . docusate sodium  100 mg Oral Daily  . pantoprazole  40 mg Oral Daily  . pentoxifylline  400 mg Oral TID WC  . vitamin E  400 Units Oral Daily    Continuous Infusions:   PRN Meds: acetaminophen, haloperidol, LORazepam, morphine CONCENTRATE, ondansetron **OR** ondansetron (ZOFRAN) IV  Physical Exam         General: Sleepy. Answers some questions but will not open eyes. Heart: Regular rate and rhythm. No murmur appreciated. Lungs: Decreased air movement, scattered crackles Abdomen: Soft, nontender, nondistended, positive bowel sounds.  Ext: No significant edema Skin: Warm and dry.  Vital Signs: BP (!) 98/46   Pulse 92   Temp 97.8 F (36.6 C) (Oral)   Resp 18   Ht 5\' 8"  (1.727 m)   Wt 68.2 kg   SpO2 99%   BMI 22.86 kg/m  SpO2: SpO2: 99 % O2 Device: O2 Device: Room Air O2 Flow Rate:    Intake/output summary:   Intake/Output Summary (Last 24 hours) at 02/03/2020 1048 Last data filed at 02/02/2020 1801 Gross per 24 hour  Intake 60 ml  Output 700 ml  Net -640 ml   LBM: Last BM Date: 02/01/20 Baseline Weight: Weight:  68.2 kg Most recent weight: Weight: 68.2 kg       Palliative Assessment/Data:    Flowsheet Rows     Most Recent Value  Intake Tab  Referral Department  Hospitalist  Unit at Time of Referral  Med/Surg Unit  Palliative Care Primary Diagnosis  Cancer  Date Notified  01/31/20  Palliative Care Type  Return Hailey Palliative Care  Reason for referral  Clarify Goals of Care  Date  of Admission  01/31/20  Date first seen by Palliative Care  02/01/20  # of days Palliative referral response time  1 Day(s)  # of days IP prior to Palliative referral  0  Clinical Assessment  Palliative Performance Scale Score  30%  Psychosocial & Spiritual Assessment  Palliative Care Outcomes  Hailey/Family meeting held?  Yes  Who was at the meeting?  Hailey, husband, son      Hailey Active Problem List   Diagnosis Date Noted  . Syncopal episodes 01/31/2020  . Pulmonary embolism ([place]) 01/31/2020  . Acute pulmonary embolism ([place]) 01/31/2020  . Palliative care by specialist   . DNR (do not resuscitate) discussion   . 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 Closed fracture of temporal bone ([place])   . Intracranial hemorrhage, nontraumatic ([place]) 11/27/2019  . Acute lower UTI 11/27/2019  . Fall 07/10/2019  . Closed fracture of head of left humerus   . Metastatic lung cancer (metastasis from lung to other site) ([place]) 07/07/2019  . Fracture, humerus, anatomical neck, left, closed, initial encounter 07/07/2019  . Metastatic cancer to brain ([place]) 07/07/2019  . Chest pain with moderate risk for cardiac etiology 12/14/2015  . Small cell lung cancer ([place]) 09/16/2013    Palliative Care Assessment & Plan   Hailey Hill who is known to our service from prior encounters.  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  CT on presentation showed extensive edema with bilateral cerebral hemispheres at the site of multiple known cerebellar metastasis with 7 mm focus of parenchymal hemorrhage.  CT angiogram of the chest showed extensive bilateral pulmonary emboli.  Right lower extremity Doppler showed DVT.  Unfortunately, she has multiple sequelae of cancer that are irreversible.  Palliative consulted for goals of care.  Recommendations/Plan: - DNR/DNI - Currently, Hailey Hill does not have IV access.  She pulls out IVs when they are placed.  I therefore added on oral medications for pain, anxiety, and agitation if the need arises. - Hopeful for transition to [facility] when bed available.  She appears stable for discharge and transfer.   Code Status:    Code Status Orders  (From admission, onward)         Start     Ordered   02/01/20 1111  Do not attempt resuscitation (DNR)  Continuous    Question Answer Comment  In the event of cardiac or respiratory ARREST Do not call a "code blue"   In the event of cardiac or respiratory ARREST Do not perform Intubation, CPR, defibrillation or ACLS   In the event of cardiac or respiratory ARREST Use medication by any route, position, wound care, and other measures to relive pain and suffering. May use oxygen, suction and manual treatment of airway obstruction as needed for comfort.      02/01/20 1110        Code Status History    Date Active Date Inactive Code Status Order ID Comments User Context   01/31/2020 1354 02/01/2020 1110 Full Code [id number]  Collier Bullock, MD ED   12/04/2019 1606 12/18/2019 1628 Full Code [id number]  Flora Lipps Inpatient   11/27/2019 2215 12/04/2019 1555 Full Code [id number]  Jani Gravel, MD ED   07/07/2019 2117  07/11/2019 1857 Full Code [id number]  Neena Rhymes, MD ED   12/14/2015 2303 12/16/2015 1751 Full Code [id number]  Etta Quill, DO ED   Advance Care Planning Activity       Prognosis:   < 2 weeks most likely.  While she is still intermittently eating at times, Hailey Hill continues to appear weaker and more sleepy/confused.  With her intracranial hemorrhage and multiple PE/DVT, she is at high risk of acute event and death with a great deal of quick onset symptom burden.  I believe that she will be best served by transition to residential hospice as recommended by Dr. Mickeal Skinner.  Discharge Planning:  Hospice facility  Care plan was discussed with Hailey, son, hospice liaison  Thank you for allowing the Palliative Medicine Team to assist in the care of this Hailey.   Total Time 20 Prolonged Time Billed No      Greater than 50%  of this time was spent counseling and coordinating care related to the above assessment and plan.  Micheline Rough, MD  Please contact Palliative Medicine Team phone at [phone] for questions and concerns.

## 2020-02-03 NOTE — Progress Notes (Signed)
Report given to RN at [facility].

## 2020-02-03 NOTE — Discharge Summary (Signed)
Discharge Summary  Hailey Hill ZOX:[id number] DOB: 30-Jun-1951  PCP: [name], NP  Admit date: 01/31/2020 Discharge date: 02/03/2020  Time spent: 35 minutes  Recommendations for Outpatient Follow-up:  1. Continue Hospice care  Discharge Diagnoses:  Active Hospital Problems   Diagnosis Date Noted  . Pulmonary embolism ([place]) 01/31/2020  . Syncopal episodes 01/31/2020  . Acute pulmonary embolism ([place]) 01/31/2020  . Intracranial hemorrhage, nontraumatic ([place]) 11/27/2019  . 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 Small cell lung cancer ([place]) 09/16/2013    Resolved Hospital Problems  No resolved problems to display.    Diet recommendation: Pleasure feedings  Vitals:   02/03/20 0532 02/03/20 0915  BP: 119/67 (!) 98/46  Pulse: 80 92  Resp: 18   Temp: 97.8 F (36.6 C)   SpO2: 97% 99%    History of present illness:  69 year old female with history of SCLC with SVC syndrome, status post fall with humerus fracture and 06/2019, cerebellar mets treated with XRT, left basal and subclavian vein thrombosis, peripheral neuropathy had syncopal episode on 01/31/2020 at radiation oncology office.  On presentation, CT of her brain showed extensive edema within bilateral cerebral hemispheres at the site of multiple known cerebellar metastases with 7 mm focus of parenchymal hemorrhage within the superior medial left cerebellum.  CT angiogram of the chest showed extensive bilateral pulmonary embolism.  Right lower extremity Doppler showed findings consistent with acute DVT involving the right peroneal veins.  Palliative Care team was consulted to assist with the management.  Goals of care discussion with plan for transition to [facility] for end of life care.  02/03/20:  Seen and examined at her bedside.  No new complaints.  She has a bed at [facility] and will be transferred today for end of life care.    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   Code Status: DNR     Procedures: None  Antimicrobials: None    Discharge Exam: BP (!) 98/46   Pulse 92   Temp 97.8 F (36.6 C) (Oral)   Resp 18   Ht 5\' 8"  (1.727 m)   Wt 68.2 kg   SpO2 99%   BMI 22.86 kg/m  . General: 69 y.o. year-old female Frail appearing in no acute distress.  Somnolent and arousable to voices. .  Cardiovascular: Regular rate and rhythm with no rubs or gallops.  Marland Kitchen Respiratory: Shallow breaths . Abdomen: Soft nontender nondistended   Discharge Instructions You were cared for by a hospitalist during your hospital stay. If you have any questions about your discharge medications or the care you received while you were in the hospital after you are discharged, you can call the unit and asked to speak with the hospitalist on call if the hospitalist that took care of you is not available. Once you are discharged, your primary care physician will  handle any further medical issues. Please note that NO REFILLS for any discharge medications will be authorized once you are discharged, as it is imperative that you return to your primary care physician (or establish a relationship with a primary care physician if you do not have one) for your aftercare needs so that they can reassess your need  for medications and monitor your lab values.   Allergies as of 02/03/2020      Reactions   Promethazine Hcl Other (See Comments)   Pt reports feeling shaky and "sick" after phenergan      Medication List    STOP taking these medications   acetaminophen 325 MG tablet Commonly known as: TYLENOL   dexamethasone 4 MG tablet Commonly known as: DECADRON   docusate sodium 100 MG capsule Commonly known as: COLACE   omeprazole 20 MG capsule Commonly known as: PRILOSEC   pentoxifylline 400 MG CR tablet Commonly known as: TRENTAL   vitamin E 180 MG (400 UNITS) capsule Commonly known as: vitamin E      Allergies  Allergen Reactions  . Promethazine Hcl Other (See Comments)    Pt reports feeling shaky and "sick" after phenergan      The results of significant diagnostics from this hospitalization (including imaging, microbiology, ancillary and laboratory) are listed below for reference.    Significant Diagnostic Studies: CT HEAD WO CONTRAST  Result Date: 02/01/2020 CLINICAL DATA:  Small cell lung cancer metastatic to brain. Follow-up cerebral hemorrhage EXAM: CT HEAD WITHOUT CONTRAST TECHNIQUE: Contiguous axial images were obtained from the base of the skull through the vertex without intravenous contrast. COMPARISON:  CT head 01/31/2020.  MRI 01/17/2020 FINDINGS: Brain: 8 mm hyperdensity in the left superior and posterior cerebellum unchanged compatible with acute hemorrhage. This is adjacent to known metastatic deposit in the vermis. No new areas of acute hemorrhage Extensive low density throughout the cerebellar hemispheres bilaterally right greater than left compatible with known metastatic disease. Mass-effect on the fourth ventricle unchanged. Mild ventricular enlargement is stable. There is generalized atrophy but there may be an element of mild obstructive hydrocephalus. No acute infarct. Vascular: Negative for hyperdense vessel Skull: Negative Sinuses/Orbits: Mild mucosal edema  paranasal sinuses. Negative orbit Other: None IMPRESSION: 8 mm acute hemorrhage left superior cerebellum is unchanged from yesterday. This is most likely related to tumoral hemorrhage. Metastatic disease in the cerebellum bilaterally right greater than left unchanged. There is posterior fossa edema and mass-effect on the fourth ventricle. Possible mild obstructive hydrocephalus. Ventricular size is stable. Electronically Signed   By: Franchot Gallo M.D.   On: 02/01/2020 11:02   CT Head W or Wo Contrast  Result Date: 01/31/2020 CLINICAL DATA:  Syncope, altered mental status, concern for worsening metastatic disease to brain; syncope, recurrent, small cell lung cancer, assess treatment response. EXAM: CT HEAD WITHOUT AND WITH CONTRAST TECHNIQUE: Contiguous axial images were obtained from the base of the skull through the vertex without and with intravenous contrast CONTRAST:  179mL OMNIPAQUE IOHEXOL 350 MG/ML SOLN COMPARISON:  Brain MRI 01/17/2020, brain MRI 11/28/2019, head CT 11/27/2019 FINDINGS: Brain: Again demonstrated is extensive edema within the bilateral cerebellar hemispheres at sites of known metastatic lesions. There is a 7 mm focus of parenchymal hemorrhage within the superomedial left cerebellum which is in close proximity to a known metastatic lesion within the midline cerebellum. There is limited evaluation for new intracranial metastases on this noncontrast head CT. Similar posterior fossa mass effect with partial effacement of the fourth ventricle. The lateral and  third ventricles are unchanged in size and configuration. A small nodular enhancing lesion along the margin of the atrium of the right lateral ventricle is better appreciated on prior MRI 01/17/2020. No supratentorial cortical infarct is identified. There is no midline shift or extra-axial fluid collection. Stable generalized parenchymal atrophy. Vascular: No hyperdense vessel. Skull: Normal. Negative for fracture or focal lesion.  Sinuses/Orbits: Visualized orbits demonstrate no acute abnormality. No significant paranasal sinus disease or mastoid effusion at the imaged levels. These results were called by telephone at the time of interpretation on 01/31/2020 at 12:12 pm to provider [facility] , who verbally acknowledged these results. IMPRESSION: Redemonstrated extensive edema within the bilateral cerebellar hemispheres at site of multiple known cerebellar metastases. There is a new 7 mm focus of parenchymal hemorrhage within the superomedial left cerebellum which is in close proximity to a known metastatic lesion. Unchanged posterior fossa mass effect. An additional small metastatic lesion along the margin of the atrium of the right lateral ventricle was better appreciated on MRI 01/17/2020. Please note there is limited assessment for new intracranial metastases on this noncontrast head CT. Electronically Signed   By: Kellie Simmering DO   On: 01/31/2020 12:12   CT Angio Chest PE W and/or Wo Contrast  Result Date: 01/31/2020 CLINICAL DATA:  Shortness of breath. History of small cell lung cancer EXAM: CT ANGIOGRAPHY CHEST WITH CONTRAST TECHNIQUE: Multidetector CT imaging of the chest was performed using the standard protocol during bolus administration of intravenous contrast. Multiplanar CT image reconstructions and MIPs were obtained to evaluate the vascular anatomy. CONTRAST:  119mL OMNIPAQUE IOHEXOL 350 MG/ML SOLN COMPARISON:  10/29/2019, 12/15/2015 FINDINGS: Cardiovascular: Pulmonary arteries are adequately opacified. Extensive filling defects within the right and left main branch pulmonary arteries with clot extending into lobar and segmental branches of all lobes. No saddle embolism. RV to LV ratio of 1.1 with slight leftward bowing of the intraventricular septum. Normal heart size. No pericardial effusion. Thoracic aorta nonaneurysmal. Mediastinum/Nodes: No axillary, mediastinal, or hilar lymphadenopathy. Stable 10 mm left thyroid  lobe nodule. No followup recommended (ref: J Am Coll Radiol. 2015 Feb;12(2): 143-50). Trachea and esophagus within normal limits. Lungs/Pleura: Band like area of consolidation and scarring in the left perihilar region appears stable compared to prior, compatible with post treatment changes. Otherwise, no focal airspace consolidation, pleural effusion, or pneumothorax. Upper Abdomen: No acute abnormality. Musculoskeletal: No chest wall abnormality. No acute or significant osseous findings. Review of the MIP images confirms the above findings. IMPRESSION: 1. Extensive bilateral pulmonary emboli with involvement of the right and left main branch pulmonary arteries with clot extending into lobar and segmental branches of all lobes. No saddle embolism. RV to LV ratio of 1.1 with slight bowing of the intraventricular septum suggesting right heart strain. 2. Stable post treatment changes in the left perihilar region. These results were called by telephone at the time of interpretation on 01/31/2020 at 12:08 pm to provider [facility] , who verbally acknowledged these results. Electronically Signed   By: Davina Poke D.O.   On: 01/31/2020 12:09   MR Brain W Wo Contrast  Addendum Date: 01/20/2020   ADDENDUM REPORT: 01/20/2020 07:42 ADDENDUM: Interval subcentimeter nodular area of enhancement along the ependymal surface of the atrium of the right lateral ventricle, which was discussed in tumor board this morning. Electronically Signed   By: Monte Fantasia M.D.   On: 01/20/2020 07:42   Addendum Date: 01/17/2020   ADDENDUM REPORT: 01/17/2020 14:13 ADDENDUM: Interval resolution of left parietal scalp  soft tissue swelling due to contusion. Resolution of bilateral mastoid effusion. Electronically Signed   By: Franchot Gallo M.D.   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 Post SRS. Restaging. EXAM: MRI HEAD WITHOUT AND WITH CONTRAST TECHNIQUE:  Multiplanar, multiecho pulse sequences of the brain and surrounding structures were obtained without and with intravenous contrast. CONTRAST:  39mL MULTIHANCE GADOBENATE DIMEGLUMINE 529 MG/ML IV SOLN COMPARISON:  MRI head 11/28/2019 FINDINGS: BRAIN New Lesions: None. Larger lesions: Large mass in the cerebellar vermis is larger now measuring 4.9 x 3.3 cm, compared with 3.3 x 3.0 cm. Extensive central necrosis. Surrounding edema is similar. Multiple foci of microhemorrhage in the lesion better seen on the current study. Right inferior cerebellar hemispheric lesion shows progression of enhancement. There is underlying hemorrhage in this lesion. The enhancing component now measures approximately 16 x 22 mm. Improvement in surrounding edema. Right anterior cerebellar lesion is larger now measuring 11 x 9 mm, previously 7.5 x 6 mm. Surrounding edema similar. Superior cerebellar vermis lesion slightly larger now measuring 15 x 10 mm compared with 12 x 10 mm. Central necrosis. Surrounding edema unchanged. Stable or Smaller lesions: Meningeal enhancement in the right posterior temporal lobe just above the mastoid sinus is unchanged. Generalized atrophy. Mild chronic microvascular ischemic change in the white matter. Marland Kitchen IMPRESSION: 1. Progression of multiple enhancing mass lesions in the posterior fossa which may be due to tumor progression or pseudoprogression. 2. Leptomeningeal enhancement in the right posterior temporal lobe is unchanged. This corresponds to a hemorrhagic contusion on the prior CT of 11/27/2019 3. No new lesions identified. Electronically Signed: By: Franchot Gallo M.D. On: 01/17/2020 13:20   VAS [place] LOWER EXTREMITY VENOUS (DVT) ([facility] and [facility] 7a-7p)  Result Date: 01/31/2020  Lower Venous DVTStudy Indications: Pulmonary embolism, and Swelling.  Risk Factors: Cancer. Limitations: Poor ultrasound/tissue interface. Comparison Study: No prior studies. Performing Technologist: Oliver Hum RVT  Examination  Guidelines: A complete evaluation includes B-mode imaging, spectral Doppler, color Doppler, and power Doppler as needed of all accessible portions of each vessel. Bilateral testing is considered an integral part of a complete examination. Limited examinations for reoccurring indications may be performed as noted. The reflux portion of the exam is performed with the patient in reverse Trendelenburg.  +---------+---------------+---------+-----------+----------+--------------+ RIGHT    CompressibilityPhasicitySpontaneityPropertiesThrombus Aging +---------+---------------+---------+-----------+----------+--------------+ CFV      Full           Yes      Yes                                 +---------+---------------+---------+-----------+----------+--------------+ SFJ      Full                                                        +---------+---------------+---------+-----------+----------+--------------+ FV Prox  Full                                                        +---------+---------------+---------+-----------+----------+--------------+ FV Mid   Full                                                        +---------+---------------+---------+-----------+----------+--------------+  FV DistalFull                                                        +---------+---------------+---------+-----------+----------+--------------+ PFV      Full                                                        +---------+---------------+---------+-----------+----------+--------------+ POP      Full           Yes      Yes                                 +---------+---------------+---------+-----------+----------+--------------+ PTV      Full                                                        +---------+---------------+---------+-----------+----------+--------------+ PERO     None                                         Acute           +---------+---------------+---------+-----------+----------+--------------+   +---------+---------------+---------+-----------+----------+--------------+ LEFT     CompressibilityPhasicitySpontaneityPropertiesThrombus Aging +---------+---------------+---------+-----------+----------+--------------+ CFV      Full           Yes      Yes                                 +---------+---------------+---------+-----------+----------+--------------+ SFJ      Full                                                        +---------+---------------+---------+-----------+----------+--------------+ FV Prox  Full                                                        +---------+---------------+---------+-----------+----------+--------------+ FV Mid   Full                                                        +---------+---------------+---------+-----------+----------+--------------+ FV DistalFull                                                        +---------+---------------+---------+-----------+----------+--------------+  PFV      Full                                                        +---------+---------------+---------+-----------+----------+--------------+ POP      Full           Yes      Yes                                 +---------+---------------+---------+-----------+----------+--------------+ PTV      Full                                                        +---------+---------------+---------+-----------+----------+--------------+ PERO     Full                                                        +---------+---------------+---------+-----------+----------+--------------+     Summary: RIGHT: - Findings consistent with acute deep vein thrombosis involving the right peroneal veins. - No cystic structure found in the popliteal fossa.  LEFT: - There is no evidence of deep vein thrombosis in the lower extremity.  - No cystic structure found in the  popliteal fossa.  *See table(s) above for measurements and observations. Electronically signed by Deitra Mayo MD on 01/31/2020 at 1:29:45 PM.    Final     Microbiology: Recent Results (from the past 240 hour(s))  SARS CORONAVIRUS 2 (TAT 6-24 HRS) Nasopharyngeal Nasopharyngeal Swab     Status: None   Collection Time: 01/31/20 12:31 PM   Specimen: Nasopharyngeal Swab  Result Value Ref Range Status   SARS Coronavirus 2 NEGATIVE NEGATIVE Final    Comment: (NOTE) SARS-CoV-2 target nucleic acids are NOT DETECTED. The SARS-CoV-2 RNA is generally detectable in upper and lower respiratory specimens during the acute phase of infection. Negative results do not preclude SARS-CoV-2 infection, do not rule out co-infections with other pathogens, and should not be used as the sole basis for treatment or other patient management decisions. Negative results must be combined with clinical observations, patient history, and epidemiological information. The expected result is Negative. Fact Sheet for Patients: SugarRoll.be Fact Sheet for Healthcare Providers: https://www.woods-mathews.com/ This test is not yet approved or cleared by the [place] FDA and  has been authorized for detection and/or diagnosis of SARS-CoV-2 by FDA under an Emergency Use Authorization (EUA). This EUA will remain  in effect (meaning this test can be used) for the duration of the COVID-19 declaration under Section 56 4(b)(1) of the Act, 21 U.S.C. section 360bbb-3(b)(1), unless the authorization is terminated or revoked sooner. Performed at [facility], [place] [address] [place], [place] [address]      Labs: Basic Metabolic Panel: Recent Labs  Lab 01/31/20 1039 02/01/20 0921  NA 137 137  K 4.1 3.6  CL 105 103  CO2 24 24  GLUCOSE 105* 93  BUN 20 15  CREATININE 0.84 0.64  CALCIUM 8.7* 8.9  MG  --  2.3   Liver Function Tests: Recent Labs  Lab 02/01/20 0921   AST 11*  ALT 18  ALKPHOS 69  BILITOT 1.1  PROT 5.8*  ALBUMIN 3.5   No results for input(s): LIPASE, AMYLASE in the last 168 hours. No results for input(s): AMMONIA in the last 168 hours. CBC: Recent Labs  Lab 01/31/20 1039 02/01/20 0921  WBC 11.1* 8.5  NEUTROABS 9.8* 6.3  HGB 13.4 13.7  HCT 39.3 39.5  MCV 91.6 90.2  PLT 172 178   Cardiac Enzymes: No results for input(s): CKTOTAL, CKMB, CKMBINDEX, TROPONINI in the last 168 hours. BNP: BNP (last 3 results) Recent Labs    01/31/20 1055  BNP 46.3    ProBNP (last 3 results) No results for input(s): PROBNP in the last 8760 hours.  CBG: Recent Labs  Lab 01/31/20 1048  GLUCAP 101*       Signed:  Kayleen Memos, MD [facility] 02/03/2020, 10:29 AM

## 2020-02-03 NOTE — Care Management Important Message (Signed)
Important Message  Patient Details IM Letter given to Eduard Roux SW Case Manager to present to the Patient Name: Hailey Hill MRN: [id number] Date of Birth: 06/21/1951   Medicare Important Message Given:  Yes     Kerin Salen 02/03/2020, 11:25 AM

## 2020-02-03 NOTE — Progress Notes (Addendum)
Manufacturing engineer [facility]) Hospital Liaison Note:  Spoke to patient's son LaneBoger to offer a [employer] bed, explain services, hospice philosophy and answer questions. Also, notified patient's son of [place] visitation policy with limited volunteer services due to [place] restrictions. Family verbal understanding and agreeable to transfer to [facility] this afternoon. Registration paperwork to be done with patient's son at noon and will update this note once complete.  [facility] TOC made aware.  Please fax discharge summary to [phone].  Please have bedside RN call report to [phone].  Please arrange transport for patient once notified of completion of [employer] paperwork.   Thank you,  Gar Ponto, RN [place] (on [place]) [phone]  UPDATE AT 1251: PATIENT SON HAS COMPLETED REGISTRATION PAPERWORK AND IS READY FOR TRANSFER TO [facility]. TOC/CM AWARE.

## 2020-02-12 ENCOUNTER — Encounter: Payer: Self-pay | Admitting: Radiation Therapy

## 2020-02-28 ENCOUNTER — Other Ambulatory Visit: Payer: Medicare HMO

## 2020-02-28 ENCOUNTER — Ambulatory Visit (HOSPITAL_COMMUNITY): Payer: Medicare HMO

## 2020-03-02 ENCOUNTER — Ambulatory Visit: Payer: Medicare HMO | Admitting: Internal Medicine

## 2020-03-06 ENCOUNTER — Ambulatory Visit: Payer: Self-pay | Admitting: Radiation Oncology

## 2020-03-07 DEATH — deceased

## 2020-12-23 IMAGING — CR LEFT SHOULDER - 2+ VIEW
2 series · 2 of 2 positions shown · non-contrast
Comparison: None.

CLINICAL DATA: Pain after fall.

EXAM:
LEFT SHOULDER - 2+ VIEW

[t shoulder y-view left]
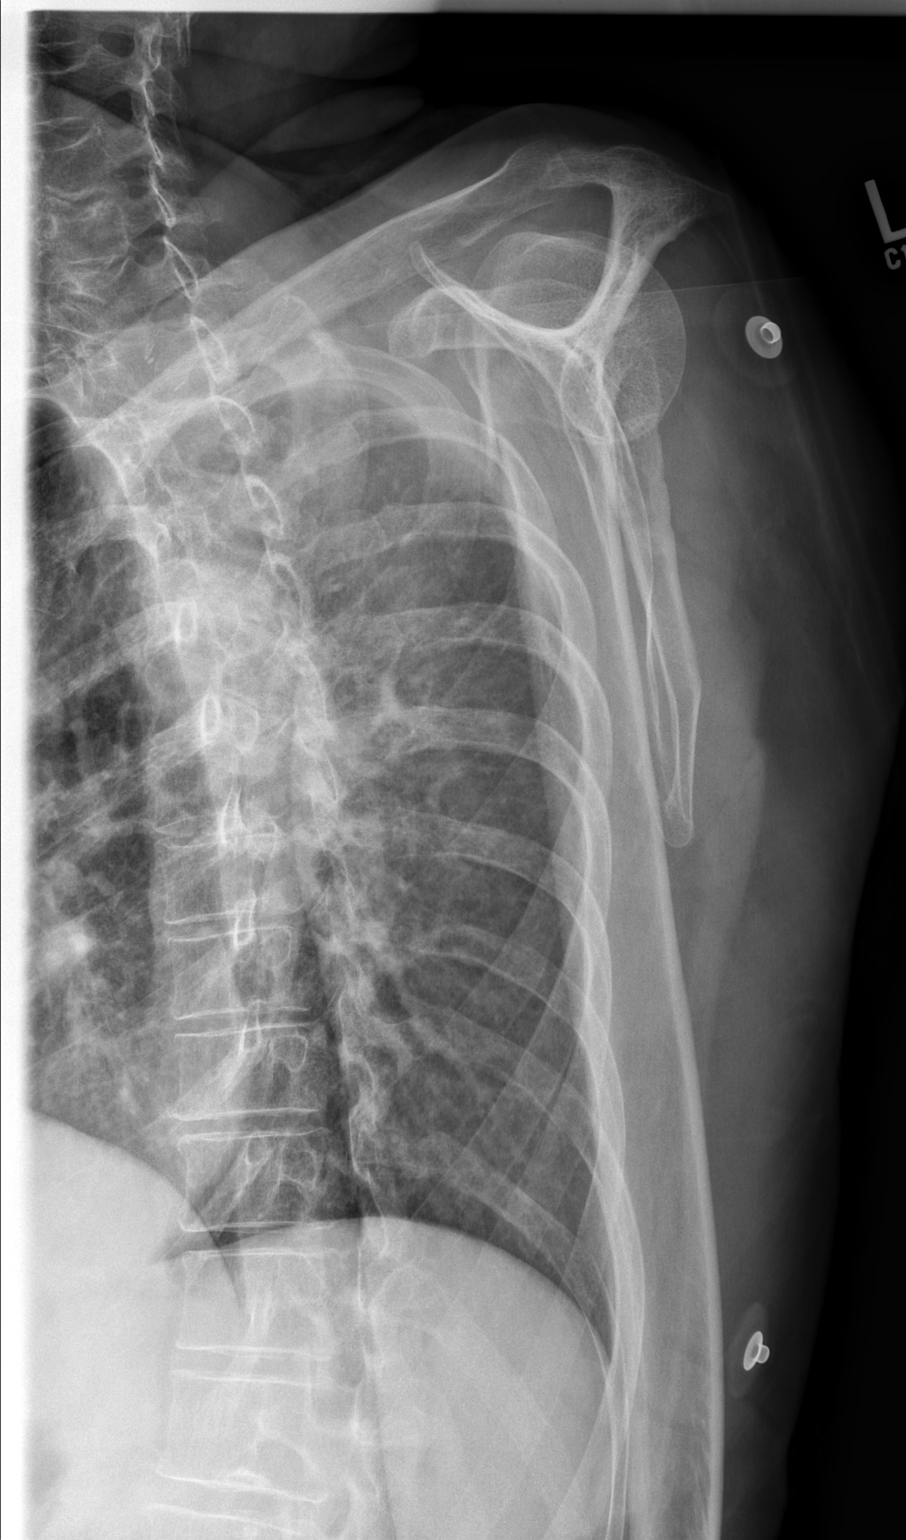

[t shoulder external left]
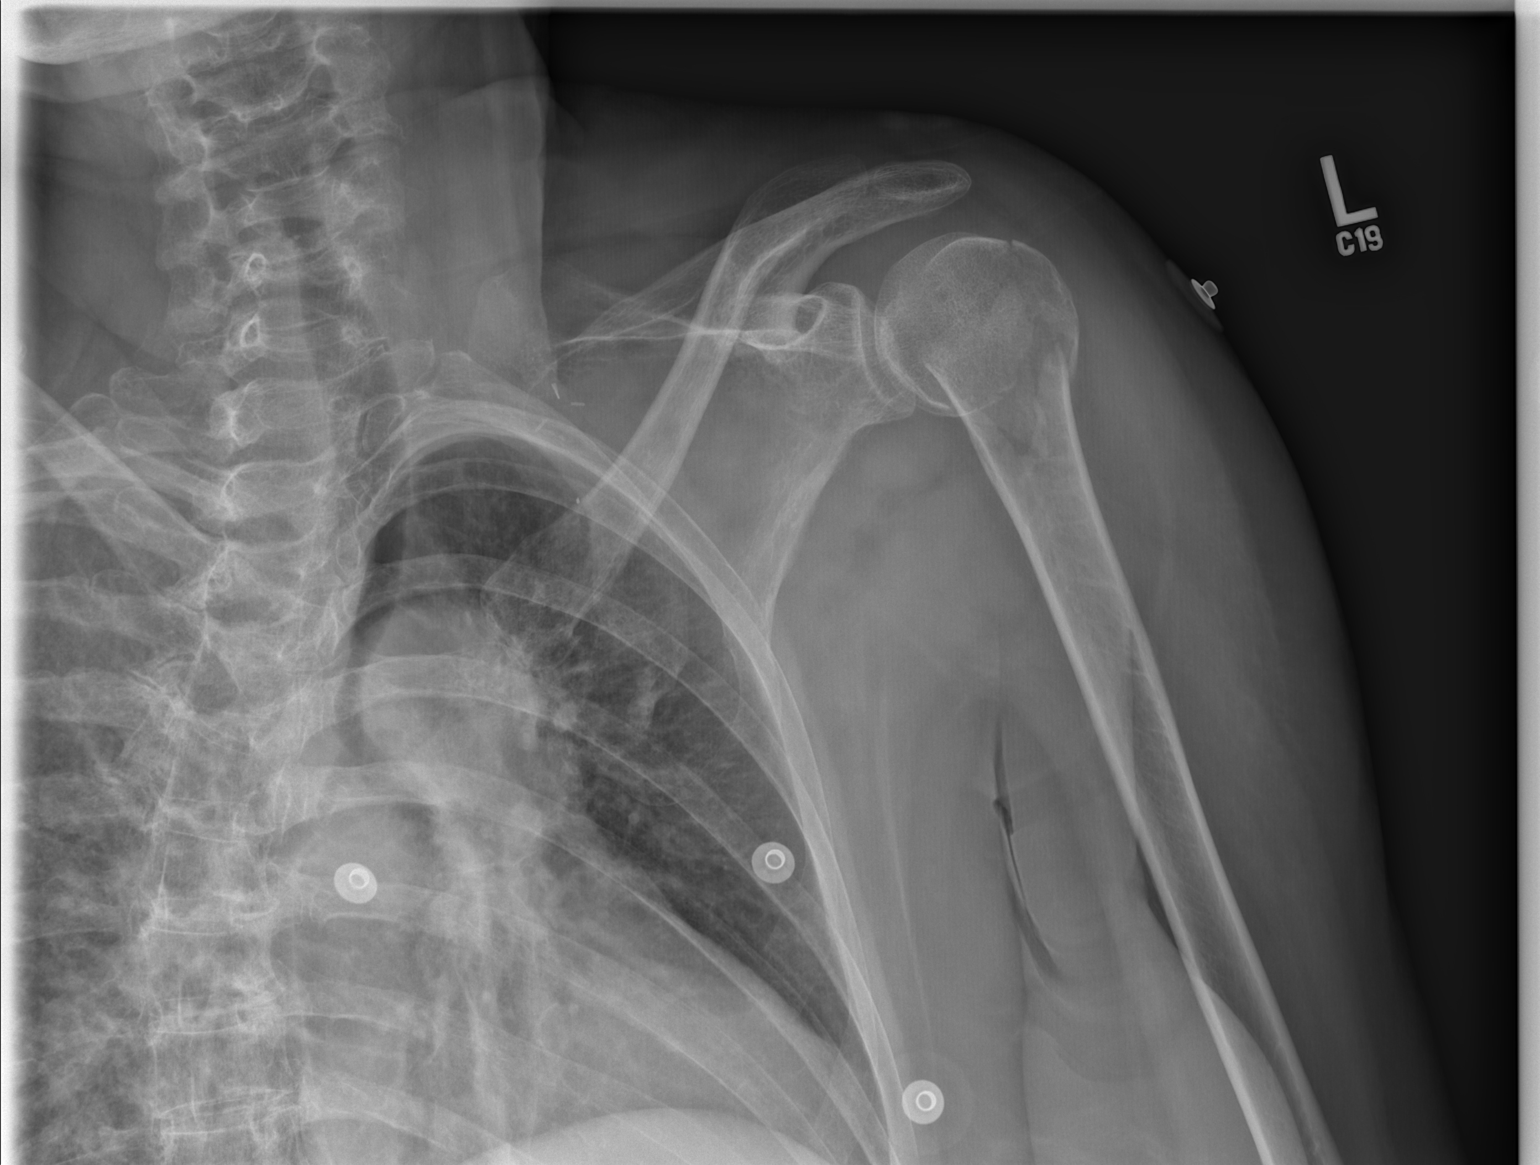

[2 of 2 positions shown; findings below may reference images not displayed]

FINDINGS: There is a comminuted fracture through the humeral head and neck
without significant displacement. No dislocation identified.
Clavicle and scapular grossly intact. Limited views of the left
chest are normal.
IMPRESSION: Comminuted fracture of the humeral head and neck without significant
displacement or dislocation.

## 2020-12-23 IMAGING — CR RIGHT RIBS AND CHEST - 3+ VIEW
5 series · 5 of 5 positions shown · non-contrast
Comparison: None.

CLINICAL DATA: Pain after fall

EXAM:
RIGHT RIBS AND CHEST - 3+ VIEW

[t ribs rpo right (1 of 2)]
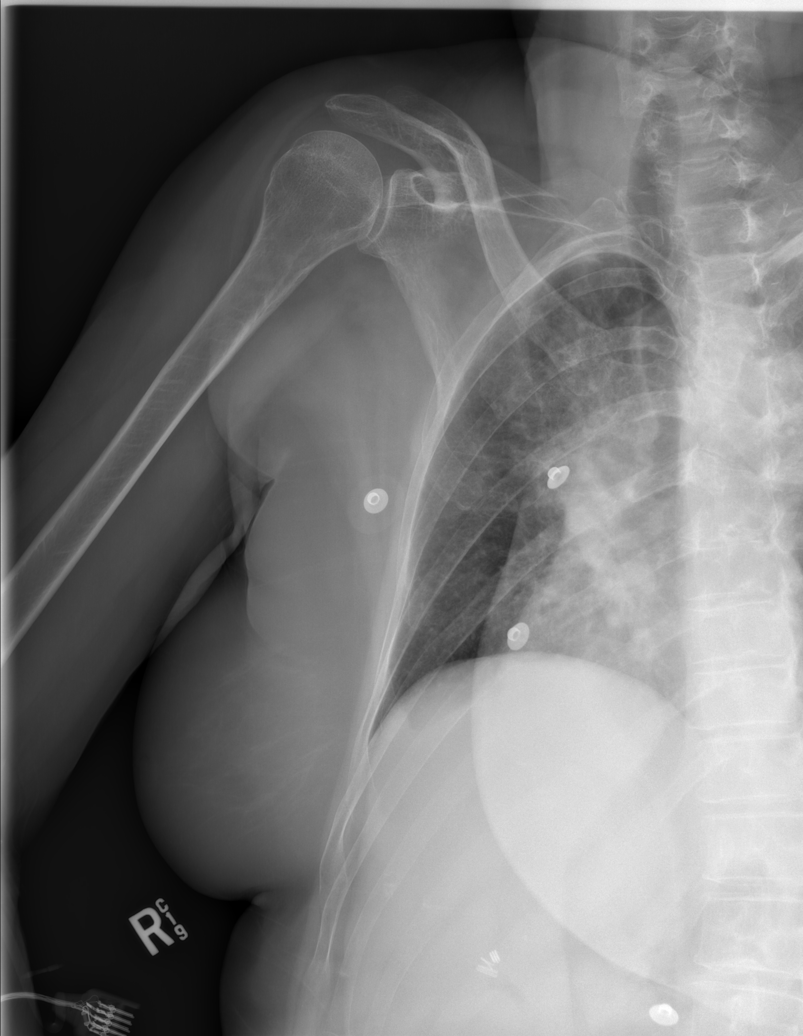

[t ribs rpo right (2 of 2)]
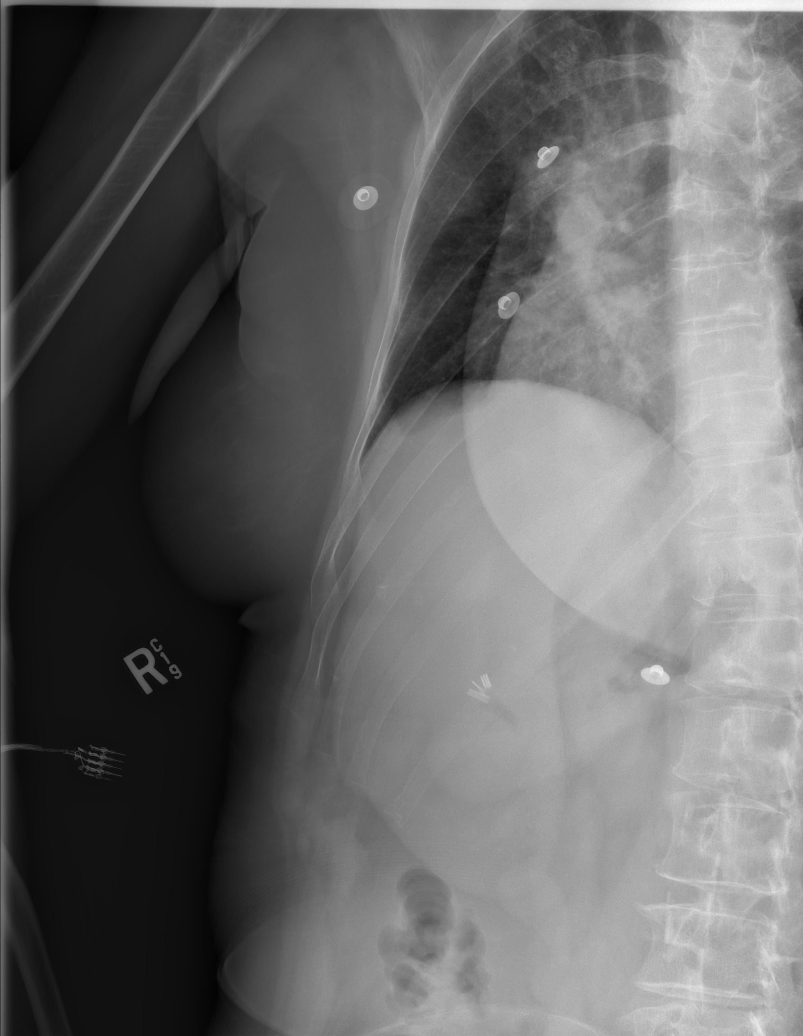

[t ribs ap upper right]
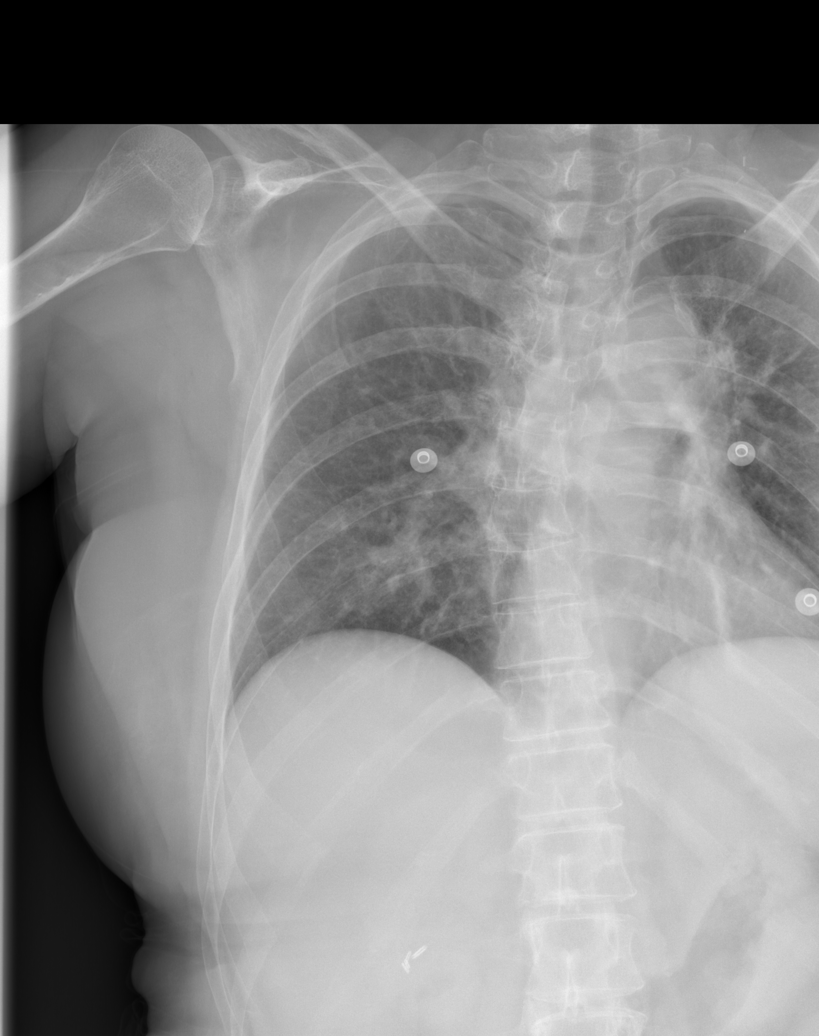

[t ribs ap lower right]
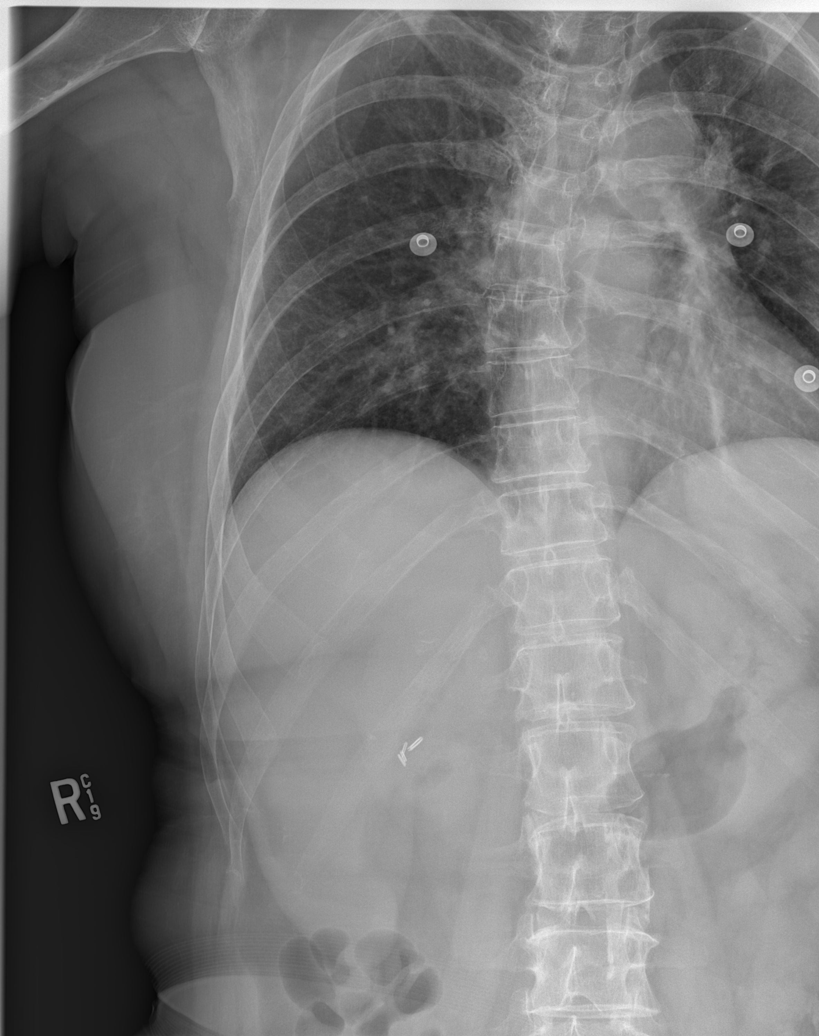

[t chest supine]
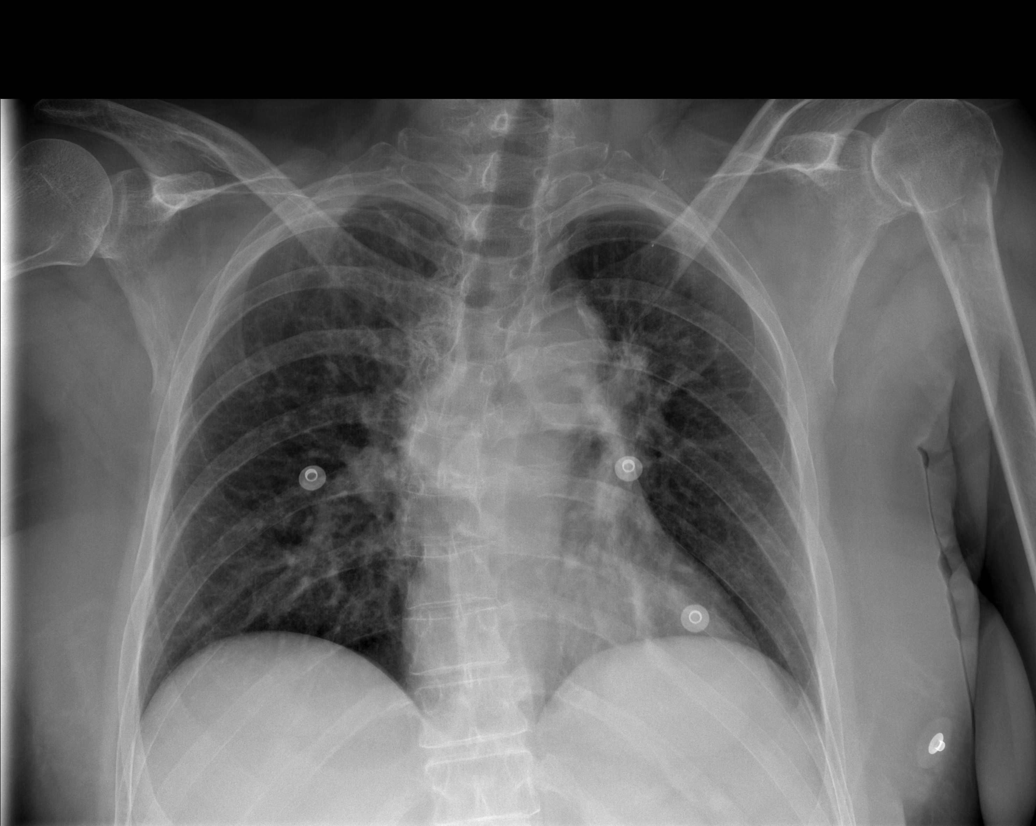

[5 of 5 positions shown; findings below may reference images not displayed]

FINDINGS: The cardiomediastinal silhouette is stable. Post radiation changes
in the left perihilar region are stable. No pneumothorax. No nodules
or masses. Comminuted fracture of the proximal humerus described on
dedicated images. No obvious right rib fractures are identified.
IMPRESSION: No rib fractures noted.
